# Patient Record
Sex: Female | Born: 1935 | Race: White | Hispanic: No | Smoking: Former smoker
Health system: Southern US, Community
[De-identification: ages and names within clinical notes are randomized; demographics above are authoritative.]

## PROBLEM LIST (undated history)

## (undated) DIAGNOSIS — I10 Essential (primary) hypertension: Secondary | ICD-10-CM

## (undated) DIAGNOSIS — F329 Major depressive disorder, single episode, unspecified: Secondary | ICD-10-CM

## (undated) DIAGNOSIS — D376 Neoplasm of uncertain behavior of liver, gallbladder and bile ducts: Secondary | ICD-10-CM

## (undated) DIAGNOSIS — T7840XA Allergy, unspecified, initial encounter: Secondary | ICD-10-CM

## (undated) DIAGNOSIS — T8859XA Other complications of anesthesia, initial encounter: Secondary | ICD-10-CM

## (undated) DIAGNOSIS — M549 Dorsalgia, unspecified: Secondary | ICD-10-CM

## (undated) DIAGNOSIS — R5383 Other fatigue: Secondary | ICD-10-CM

## (undated) DIAGNOSIS — D126 Benign neoplasm of colon, unspecified: Secondary | ICD-10-CM

## (undated) DIAGNOSIS — K648 Other hemorrhoids: Secondary | ICD-10-CM

## (undated) DIAGNOSIS — R209 Unspecified disturbances of skin sensation: Secondary | ICD-10-CM

## (undated) DIAGNOSIS — E079 Disorder of thyroid, unspecified: Secondary | ICD-10-CM

## (undated) DIAGNOSIS — M810 Age-related osteoporosis without current pathological fracture: Secondary | ICD-10-CM

## (undated) DIAGNOSIS — K644 Residual hemorrhoidal skin tags: Secondary | ICD-10-CM

## (undated) DIAGNOSIS — K589 Irritable bowel syndrome without diarrhea: Secondary | ICD-10-CM

## (undated) DIAGNOSIS — D751 Secondary polycythemia: Secondary | ICD-10-CM

## (undated) DIAGNOSIS — K5900 Constipation, unspecified: Secondary | ICD-10-CM

## (undated) DIAGNOSIS — F411 Generalized anxiety disorder: Secondary | ICD-10-CM

## (undated) DIAGNOSIS — R7301 Impaired fasting glucose: Secondary | ICD-10-CM

## (undated) DIAGNOSIS — S32040A Wedge compression fracture of fourth lumbar vertebra, initial encounter for closed fracture: Secondary | ICD-10-CM

## (undated) DIAGNOSIS — K5790 Diverticulosis of intestine, part unspecified, without perforation or abscess without bleeding: Secondary | ICD-10-CM

## (undated) DIAGNOSIS — T4145XA Adverse effect of unspecified anesthetic, initial encounter: Secondary | ICD-10-CM

## (undated) DIAGNOSIS — M4800 Spinal stenosis, site unspecified: Secondary | ICD-10-CM

## (undated) DIAGNOSIS — N3010 Interstitial cystitis (chronic) without hematuria: Secondary | ICD-10-CM

## (undated) DIAGNOSIS — K5730 Diverticulosis of large intestine without perforation or abscess without bleeding: Secondary | ICD-10-CM

## (undated) DIAGNOSIS — E785 Hyperlipidemia, unspecified: Secondary | ICD-10-CM

## (undated) DIAGNOSIS — C801 Malignant (primary) neoplasm, unspecified: Secondary | ICD-10-CM

## (undated) DIAGNOSIS — Z853 Personal history of malignant neoplasm of breast: Secondary | ICD-10-CM

## (undated) DIAGNOSIS — R5381 Other malaise: Secondary | ICD-10-CM

## (undated) HISTORY — DX: Disorder of thyroid, unspecified: E07.9

## (undated) HISTORY — PX: CHOLECYSTECTOMY: SHX55

## (undated) HISTORY — DX: Impaired fasting glucose: R73.01

## (undated) HISTORY — PX: CERVICAL SPINE SURGERY: SHX589

## (undated) HISTORY — DX: Essential (primary) hypertension: I10

## (undated) HISTORY — DX: Dorsalgia, unspecified: M54.9

## (undated) HISTORY — DX: Secondary polycythemia: D75.1

## (undated) HISTORY — PX: HEMORRHOID BANDING: SHX5850

## (undated) HISTORY — DX: Diverticulosis of large intestine without perforation or abscess without bleeding: K57.30

## (undated) HISTORY — DX: Generalized anxiety disorder: F41.1

## (undated) HISTORY — DX: Spinal stenosis, site unspecified: M48.00

## (undated) HISTORY — DX: Other malaise: R53.81

## (undated) HISTORY — DX: Benign neoplasm of colon, unspecified: D12.6

## (undated) HISTORY — DX: Hypercalcemia: E83.52

## (undated) HISTORY — DX: Neoplasm of uncertain behavior of liver, gallbladder and bile ducts: D37.6

## (undated) HISTORY — DX: Unspecified disturbances of skin sensation: R20.9

## (undated) HISTORY — DX: Age-related osteoporosis without current pathological fracture: M81.0

## (undated) HISTORY — PX: BACK SURGERY: SHX140

## (undated) HISTORY — DX: Other complications of anesthesia, initial encounter: T88.59XA

## (undated) HISTORY — DX: Irritable bowel syndrome without diarrhea: K58.9

## (undated) HISTORY — DX: Interstitial cystitis (chronic) without hematuria: N30.10

## (undated) HISTORY — DX: Malignant (primary) neoplasm, unspecified: C80.1

## (undated) HISTORY — PX: COLONOSCOPY W/ BIOPSIES: SHX1374

## (undated) HISTORY — DX: Diverticulosis of intestine, part unspecified, without perforation or abscess without bleeding: K57.90

## (undated) HISTORY — DX: Other fatigue: R53.83

## (undated) HISTORY — PX: BREAST SURGERY: SHX581

## (undated) HISTORY — DX: Major depressive disorder, single episode, unspecified: F32.9

## (undated) HISTORY — DX: Constipation, unspecified: K59.00

## (undated) HISTORY — DX: Other hemorrhoids: K64.8

## (undated) HISTORY — DX: Hyperlipidemia, unspecified: E78.5

## (undated) HISTORY — DX: Adverse effect of unspecified anesthetic, initial encounter: T41.45XA

## (undated) HISTORY — PX: ABDOMINAL HYSTERECTOMY: SHX81

## (undated) HISTORY — DX: Personal history of malignant neoplasm of breast: Z85.3

## (undated) HISTORY — DX: Allergy, unspecified, initial encounter: T78.40XA

## (undated) HISTORY — DX: Residual hemorrhoidal skin tags: K64.4

## (undated) HISTORY — PX: FIXATION KYPHOPLASTY LUMBAR SPINE: SHX1642

---

## 1997-08-24 ENCOUNTER — Encounter: Payer: Self-pay | Admitting: Internal Medicine

## 1997-08-24 LAB — CONVERTED CEMR LAB

## 1999-01-02 ENCOUNTER — Encounter: Payer: Self-pay | Admitting: Internal Medicine

## 1999-01-02 ENCOUNTER — Ambulatory Visit (HOSPITAL_COMMUNITY): Admission: RE | Admit: 1999-01-02 | Discharge: 1999-01-02 | Payer: Self-pay | Admitting: Internal Medicine

## 1999-04-10 ENCOUNTER — Other Ambulatory Visit: Admission: RE | Admit: 1999-04-10 | Discharge: 1999-04-10 | Payer: Self-pay | Admitting: Obstetrics and Gynecology

## 1999-09-02 ENCOUNTER — Encounter: Admission: RE | Admit: 1999-09-02 | Discharge: 1999-09-02 | Payer: Self-pay | Admitting: Orthopedic Surgery

## 1999-09-02 ENCOUNTER — Encounter: Payer: Self-pay | Admitting: Orthopedic Surgery

## 1999-11-10 ENCOUNTER — Encounter: Admission: RE | Admit: 1999-11-10 | Discharge: 1999-11-17 | Payer: Self-pay | Admitting: Orthopedic Surgery

## 1999-12-10 ENCOUNTER — Encounter: Admission: RE | Admit: 1999-12-10 | Discharge: 2000-01-28 | Payer: Self-pay | Admitting: Orthopedic Surgery

## 2000-10-06 ENCOUNTER — Encounter: Admission: RE | Admit: 2000-10-06 | Discharge: 2000-11-25 | Payer: Self-pay | Admitting: *Deleted

## 2001-05-31 ENCOUNTER — Encounter: Admission: RE | Admit: 2001-05-31 | Discharge: 2001-05-31 | Payer: Self-pay | Admitting: Oncology

## 2001-05-31 ENCOUNTER — Encounter (HOSPITAL_COMMUNITY): Admission: RE | Admit: 2001-05-31 | Discharge: 2001-06-30 | Payer: Self-pay | Admitting: Oncology

## 2002-05-30 ENCOUNTER — Encounter (HOSPITAL_COMMUNITY): Admission: RE | Admit: 2002-05-30 | Discharge: 2002-06-29 | Payer: Self-pay | Admitting: Oncology

## 2002-05-30 ENCOUNTER — Encounter: Admission: RE | Admit: 2002-05-30 | Discharge: 2002-05-30 | Payer: Self-pay | Admitting: Oncology

## 2003-05-31 ENCOUNTER — Encounter: Payer: Self-pay | Admitting: Gastroenterology

## 2003-05-31 ENCOUNTER — Encounter: Admission: RE | Admit: 2003-05-31 | Discharge: 2003-05-31 | Payer: Self-pay | Admitting: Oncology

## 2003-05-31 ENCOUNTER — Ambulatory Visit (HOSPITAL_COMMUNITY): Admission: RE | Admit: 2003-05-31 | Discharge: 2003-05-31 | Payer: Self-pay | Admitting: Gastroenterology

## 2003-05-31 ENCOUNTER — Encounter (HOSPITAL_COMMUNITY): Admission: RE | Admit: 2003-05-31 | Discharge: 2003-06-30 | Payer: Self-pay | Admitting: Oncology

## 2003-08-12 ENCOUNTER — Encounter (INDEPENDENT_AMBULATORY_CARE_PROVIDER_SITE_OTHER): Payer: Self-pay | Admitting: Specialist

## 2003-08-12 ENCOUNTER — Observation Stay (HOSPITAL_COMMUNITY): Admission: RE | Admit: 2003-08-12 | Discharge: 2003-08-13 | Payer: Self-pay | Admitting: Surgery

## 2004-06-04 ENCOUNTER — Other Ambulatory Visit: Payer: Self-pay

## 2004-06-04 ENCOUNTER — Ambulatory Visit: Payer: Self-pay | Admitting: Urology

## 2004-06-08 ENCOUNTER — Encounter (HOSPITAL_COMMUNITY): Admission: RE | Admit: 2004-06-08 | Discharge: 2004-07-08 | Payer: Self-pay | Admitting: Oncology

## 2004-06-08 ENCOUNTER — Encounter: Admission: RE | Admit: 2004-06-08 | Discharge: 2004-06-08 | Payer: Self-pay | Admitting: Oncology

## 2004-06-09 ENCOUNTER — Ambulatory Visit: Payer: Self-pay | Admitting: Urology

## 2004-07-07 ENCOUNTER — Ambulatory Visit: Payer: Self-pay | Admitting: Internal Medicine

## 2004-09-30 ENCOUNTER — Ambulatory Visit: Payer: Self-pay | Admitting: Gastroenterology

## 2004-10-09 ENCOUNTER — Emergency Department (HOSPITAL_COMMUNITY): Admission: EM | Admit: 2004-10-09 | Discharge: 2004-10-09 | Payer: Self-pay | Admitting: Emergency Medicine

## 2004-11-26 ENCOUNTER — Ambulatory Visit: Payer: Self-pay | Admitting: Internal Medicine

## 2004-12-08 ENCOUNTER — Ambulatory Visit: Payer: Self-pay | Admitting: Internal Medicine

## 2004-12-15 ENCOUNTER — Ambulatory Visit: Payer: Self-pay

## 2004-12-16 ENCOUNTER — Ambulatory Visit: Payer: Self-pay | Admitting: Internal Medicine

## 2005-03-10 ENCOUNTER — Ambulatory Visit: Payer: Self-pay | Admitting: Internal Medicine

## 2005-03-17 ENCOUNTER — Ambulatory Visit: Payer: Self-pay | Admitting: Internal Medicine

## 2005-04-23 DIAGNOSIS — D126 Benign neoplasm of colon, unspecified: Secondary | ICD-10-CM

## 2005-04-23 HISTORY — DX: Benign neoplasm of colon, unspecified: D12.6

## 2005-04-27 ENCOUNTER — Ambulatory Visit: Payer: Self-pay | Admitting: Gastroenterology

## 2005-05-11 ENCOUNTER — Encounter (INDEPENDENT_AMBULATORY_CARE_PROVIDER_SITE_OTHER): Payer: Self-pay | Admitting: *Deleted

## 2005-05-11 ENCOUNTER — Ambulatory Visit: Payer: Self-pay | Admitting: Gastroenterology

## 2005-05-11 DIAGNOSIS — K5730 Diverticulosis of large intestine without perforation or abscess without bleeding: Secondary | ICD-10-CM

## 2005-05-11 DIAGNOSIS — K648 Other hemorrhoids: Secondary | ICD-10-CM

## 2005-05-11 DIAGNOSIS — K644 Residual hemorrhoidal skin tags: Secondary | ICD-10-CM

## 2005-05-11 HISTORY — DX: Other hemorrhoids: K64.8

## 2005-05-11 HISTORY — DX: Residual hemorrhoidal skin tags: K64.4

## 2005-05-11 HISTORY — DX: Diverticulosis of large intestine without perforation or abscess without bleeding: K57.30

## 2005-06-08 ENCOUNTER — Encounter (HOSPITAL_COMMUNITY): Admission: RE | Admit: 2005-06-08 | Discharge: 2005-07-08 | Payer: Self-pay | Admitting: Oncology

## 2005-06-08 ENCOUNTER — Ambulatory Visit (HOSPITAL_COMMUNITY): Payer: Self-pay | Admitting: Oncology

## 2005-06-08 ENCOUNTER — Encounter: Admission: RE | Admit: 2005-06-08 | Discharge: 2005-06-08 | Payer: Self-pay | Admitting: Oncology

## 2005-07-22 ENCOUNTER — Ambulatory Visit: Payer: Self-pay | Admitting: Internal Medicine

## 2005-07-23 ENCOUNTER — Other Ambulatory Visit: Admission: RE | Admit: 2005-07-23 | Discharge: 2005-07-23 | Payer: Self-pay | Admitting: Obstetrics and Gynecology

## 2005-09-16 ENCOUNTER — Ambulatory Visit: Payer: Self-pay | Admitting: Physical Medicine & Rehabilitation

## 2005-09-16 ENCOUNTER — Encounter
Admission: RE | Admit: 2005-09-16 | Discharge: 2005-12-15 | Payer: Self-pay | Admitting: Physical Medicine & Rehabilitation

## 2005-10-20 ENCOUNTER — Ambulatory Visit: Payer: Self-pay | Admitting: Physical Medicine & Rehabilitation

## 2005-12-01 ENCOUNTER — Ambulatory Visit: Payer: Self-pay | Admitting: Physical Medicine & Rehabilitation

## 2005-12-04 ENCOUNTER — Emergency Department (HOSPITAL_COMMUNITY): Admission: EM | Admit: 2005-12-04 | Discharge: 2005-12-04 | Payer: Self-pay | Admitting: Emergency Medicine

## 2005-12-15 ENCOUNTER — Encounter
Admission: RE | Admit: 2005-12-15 | Discharge: 2006-03-15 | Payer: Self-pay | Admitting: Physical Medicine & Rehabilitation

## 2006-01-13 ENCOUNTER — Ambulatory Visit: Payer: Self-pay | Admitting: Physical Medicine & Rehabilitation

## 2006-03-09 ENCOUNTER — Ambulatory Visit: Payer: Self-pay | Admitting: Physical Medicine & Rehabilitation

## 2006-03-23 ENCOUNTER — Encounter
Admission: RE | Admit: 2006-03-23 | Discharge: 2006-06-21 | Payer: Self-pay | Admitting: Physical Medicine & Rehabilitation

## 2006-03-31 ENCOUNTER — Ambulatory Visit: Payer: Self-pay | Admitting: Physical Medicine & Rehabilitation

## 2006-04-22 ENCOUNTER — Ambulatory Visit: Payer: Self-pay | Admitting: Physical Medicine & Rehabilitation

## 2006-05-23 ENCOUNTER — Ambulatory Visit: Payer: Self-pay | Admitting: Physical Medicine & Rehabilitation

## 2006-05-27 ENCOUNTER — Ambulatory Visit: Payer: Self-pay | Admitting: Physical Medicine & Rehabilitation

## 2006-06-07 ENCOUNTER — Encounter: Admission: RE | Admit: 2006-06-07 | Discharge: 2006-06-07 | Payer: Self-pay | Admitting: Oncology

## 2006-06-07 ENCOUNTER — Ambulatory Visit (HOSPITAL_COMMUNITY): Payer: Self-pay | Admitting: Oncology

## 2006-06-07 ENCOUNTER — Encounter (HOSPITAL_COMMUNITY): Admission: RE | Admit: 2006-06-07 | Discharge: 2006-07-07 | Payer: Self-pay | Admitting: Oncology

## 2006-06-27 ENCOUNTER — Encounter
Admission: RE | Admit: 2006-06-27 | Discharge: 2006-09-25 | Payer: Self-pay | Admitting: Physical Medicine & Rehabilitation

## 2006-06-27 ENCOUNTER — Ambulatory Visit: Payer: Self-pay | Admitting: Physical Medicine & Rehabilitation

## 2006-07-06 ENCOUNTER — Ambulatory Visit: Payer: Self-pay | Admitting: Physical Medicine & Rehabilitation

## 2006-07-19 ENCOUNTER — Ambulatory Visit: Payer: Self-pay | Admitting: Internal Medicine

## 2006-07-19 LAB — CONVERTED CEMR LAB: HCT: 44.7 % (ref 36.0–46.0)

## 2006-09-12 ENCOUNTER — Encounter
Admission: RE | Admit: 2006-09-12 | Discharge: 2006-12-11 | Payer: Self-pay | Admitting: Physical Medicine & Rehabilitation

## 2006-09-16 ENCOUNTER — Ambulatory Visit: Payer: Self-pay | Admitting: Physical Medicine & Rehabilitation

## 2006-10-14 ENCOUNTER — Ambulatory Visit: Payer: Self-pay | Admitting: Internal Medicine

## 2006-10-14 LAB — CONVERTED CEMR LAB
ALT: 32 units/L (ref 0–40)
Alkaline Phosphatase: 90 units/L (ref 39–117)
BUN: 11 mg/dL (ref 6–23)
Basophils Absolute: 0.1 10*3/uL (ref 0.0–0.1)
Bilirubin Urine: NEGATIVE
Bilirubin, Direct: 0.1 mg/dL (ref 0.0–0.3)
Calcium: 10.9 mg/dL — ABNORMAL HIGH (ref 8.4–10.5)
Eosinophils Absolute: 0.1 10*3/uL (ref 0.0–0.6)
GFR calc Af Amer: 105 mL/min
GFR calc non Af Amer: 87 mL/min
HDL: 36.4 mg/dL — ABNORMAL LOW (ref >39.0)
Lymphocytes Relative: 38 % (ref 12.0–46.0)
MCV: 87.4 fL (ref 78.0–100.0)
Monocytes Relative: 4.5 % (ref 3.0–11.0)
Neutro Abs: 3.8 10*3/uL (ref 1.4–7.7)
Platelets: 254 10*3/uL (ref 150–400)
Specific Gravity, Urine: 1.02 (ref 1.000–1.03)
TSH: 1.77 microintl units/mL (ref 0.35–5.50)
Triglycerides: 409 mg/dL (ref 0–149)
Urine Glucose: NEGATIVE mg/dL

## 2006-10-17 ENCOUNTER — Ambulatory Visit: Payer: Self-pay | Admitting: Internal Medicine

## 2006-10-23 ENCOUNTER — Encounter (INDEPENDENT_AMBULATORY_CARE_PROVIDER_SITE_OTHER): Payer: Self-pay | Admitting: *Deleted

## 2006-10-23 ENCOUNTER — Emergency Department (HOSPITAL_COMMUNITY): Admission: EM | Admit: 2006-10-23 | Discharge: 2006-10-23 | Payer: Self-pay | Admitting: Emergency Medicine

## 2006-10-28 ENCOUNTER — Ambulatory Visit: Payer: Self-pay | Admitting: Physical Medicine & Rehabilitation

## 2006-12-12 ENCOUNTER — Ambulatory Visit: Payer: Self-pay | Admitting: Physical Medicine & Rehabilitation

## 2006-12-12 ENCOUNTER — Encounter
Admission: RE | Admit: 2006-12-12 | Discharge: 2007-03-12 | Payer: Self-pay | Admitting: Physical Medicine & Rehabilitation

## 2006-12-15 ENCOUNTER — Ambulatory Visit: Payer: Self-pay | Admitting: Internal Medicine

## 2006-12-15 LAB — CONVERTED CEMR LAB
AST: 24 units/L (ref 0–37)
Alkaline Phosphatase: 82 units/L (ref 39–117)
Cholesterol: 200 mg/dL (ref 0–200)
Total Bilirubin: 0.9 mg/dL (ref 0.3–1.2)
Total CHOL/HDL Ratio: 4.9
Total Protein: 7 g/dL (ref 6.0–8.3)
Triglycerides: 172 mg/dL — ABNORMAL HIGH (ref 0–149)

## 2006-12-20 ENCOUNTER — Ambulatory Visit: Payer: Self-pay | Admitting: Internal Medicine

## 2007-01-23 ENCOUNTER — Ambulatory Visit: Payer: Self-pay | Admitting: Physical Medicine & Rehabilitation

## 2007-02-04 ENCOUNTER — Encounter (INDEPENDENT_AMBULATORY_CARE_PROVIDER_SITE_OTHER): Payer: Self-pay | Admitting: *Deleted

## 2007-02-04 ENCOUNTER — Encounter: Admission: RE | Admit: 2007-02-04 | Discharge: 2007-02-04 | Payer: Self-pay | Admitting: Neurosurgery

## 2007-02-21 ENCOUNTER — Ambulatory Visit (HOSPITAL_COMMUNITY): Admission: RE | Admit: 2007-02-21 | Discharge: 2007-02-22 | Payer: Self-pay | Admitting: Neurosurgery

## 2007-03-01 ENCOUNTER — Ambulatory Visit: Payer: Self-pay | Admitting: Internal Medicine

## 2007-03-01 DIAGNOSIS — E785 Hyperlipidemia, unspecified: Secondary | ICD-10-CM | POA: Insufficient documentation

## 2007-03-01 DIAGNOSIS — M4800 Spinal stenosis, site unspecified: Secondary | ICD-10-CM

## 2007-03-01 DIAGNOSIS — M549 Dorsalgia, unspecified: Secondary | ICD-10-CM | POA: Insufficient documentation

## 2007-03-01 HISTORY — DX: Spinal stenosis, site unspecified: M48.00

## 2007-03-01 HISTORY — DX: Dorsalgia, unspecified: M54.9

## 2007-03-01 HISTORY — DX: Hyperlipidemia, unspecified: E78.5

## 2007-03-03 ENCOUNTER — Telehealth (INDEPENDENT_AMBULATORY_CARE_PROVIDER_SITE_OTHER): Payer: Self-pay | Admitting: *Deleted

## 2007-03-06 ENCOUNTER — Ambulatory Visit: Payer: Self-pay | Admitting: Physical Medicine & Rehabilitation

## 2007-03-10 ENCOUNTER — Encounter (INDEPENDENT_AMBULATORY_CARE_PROVIDER_SITE_OTHER): Payer: Self-pay | Admitting: *Deleted

## 2007-03-10 ENCOUNTER — Ambulatory Visit: Payer: Self-pay | Admitting: Internal Medicine

## 2007-03-13 ENCOUNTER — Encounter (INDEPENDENT_AMBULATORY_CARE_PROVIDER_SITE_OTHER): Payer: Self-pay | Admitting: *Deleted

## 2007-04-03 ENCOUNTER — Encounter
Admission: RE | Admit: 2007-04-03 | Discharge: 2007-07-02 | Payer: Self-pay | Admitting: Physical Medicine & Rehabilitation

## 2007-04-06 ENCOUNTER — Encounter (INDEPENDENT_AMBULATORY_CARE_PROVIDER_SITE_OTHER): Payer: Self-pay | Admitting: *Deleted

## 2007-04-20 ENCOUNTER — Telehealth (INDEPENDENT_AMBULATORY_CARE_PROVIDER_SITE_OTHER): Payer: Self-pay | Admitting: *Deleted

## 2007-04-25 ENCOUNTER — Ambulatory Visit: Payer: Self-pay | Admitting: Physical Medicine & Rehabilitation

## 2007-05-10 ENCOUNTER — Telehealth (INDEPENDENT_AMBULATORY_CARE_PROVIDER_SITE_OTHER): Payer: Self-pay | Admitting: *Deleted

## 2007-05-11 ENCOUNTER — Telehealth (INDEPENDENT_AMBULATORY_CARE_PROVIDER_SITE_OTHER): Payer: Self-pay | Admitting: *Deleted

## 2007-06-06 ENCOUNTER — Ambulatory Visit (HOSPITAL_COMMUNITY): Payer: Self-pay | Admitting: Oncology

## 2007-06-06 ENCOUNTER — Encounter (HOSPITAL_COMMUNITY): Admission: RE | Admit: 2007-06-06 | Discharge: 2007-07-06 | Payer: Self-pay | Admitting: Oncology

## 2007-06-12 ENCOUNTER — Ambulatory Visit: Payer: Self-pay | Admitting: Physical Medicine & Rehabilitation

## 2007-07-03 ENCOUNTER — Encounter
Admission: RE | Admit: 2007-07-03 | Discharge: 2007-08-01 | Payer: Self-pay | Admitting: Physical Medicine & Rehabilitation

## 2007-07-28 ENCOUNTER — Ambulatory Visit: Payer: Self-pay | Admitting: Physical Medicine & Rehabilitation

## 2007-08-30 ENCOUNTER — Telehealth: Payer: Self-pay | Admitting: Internal Medicine

## 2007-09-06 ENCOUNTER — Encounter
Admission: RE | Admit: 2007-09-06 | Discharge: 2007-12-05 | Payer: Self-pay | Admitting: Physical Medicine & Rehabilitation

## 2007-09-06 ENCOUNTER — Ambulatory Visit: Payer: Self-pay | Admitting: Physical Medicine & Rehabilitation

## 2007-09-06 ENCOUNTER — Ambulatory Visit: Payer: Self-pay | Admitting: Internal Medicine

## 2007-09-06 DIAGNOSIS — R209 Unspecified disturbances of skin sensation: Secondary | ICD-10-CM | POA: Insufficient documentation

## 2007-09-06 DIAGNOSIS — N3010 Interstitial cystitis (chronic) without hematuria: Secondary | ICD-10-CM

## 2007-09-06 HISTORY — DX: Unspecified disturbances of skin sensation: R20.9

## 2007-09-06 HISTORY — DX: Interstitial cystitis (chronic) without hematuria: N30.10

## 2007-09-07 ENCOUNTER — Telehealth: Payer: Self-pay | Admitting: Internal Medicine

## 2007-10-05 ENCOUNTER — Encounter: Payer: Self-pay | Admitting: Internal Medicine

## 2007-10-11 ENCOUNTER — Ambulatory Visit: Payer: Self-pay | Admitting: Physical Medicine & Rehabilitation

## 2007-10-11 ENCOUNTER — Encounter (INDEPENDENT_AMBULATORY_CARE_PROVIDER_SITE_OTHER): Payer: Self-pay | Admitting: *Deleted

## 2007-10-11 LAB — HM MAMMOGRAPHY: HM Mammogram: NORMAL

## 2007-10-13 DIAGNOSIS — F411 Generalized anxiety disorder: Secondary | ICD-10-CM

## 2007-10-13 DIAGNOSIS — F329 Major depressive disorder, single episode, unspecified: Secondary | ICD-10-CM

## 2007-10-13 DIAGNOSIS — I10 Essential (primary) hypertension: Secondary | ICD-10-CM

## 2007-10-13 DIAGNOSIS — F3289 Other specified depressive episodes: Secondary | ICD-10-CM

## 2007-10-13 HISTORY — DX: Major depressive disorder, single episode, unspecified: F32.9

## 2007-10-13 HISTORY — DX: Essential (primary) hypertension: I10

## 2007-10-13 HISTORY — DX: Generalized anxiety disorder: F41.1

## 2007-10-13 HISTORY — DX: Other specified depressive episodes: F32.89

## 2007-10-16 ENCOUNTER — Ambulatory Visit: Payer: Self-pay | Admitting: Internal Medicine

## 2007-10-25 ENCOUNTER — Ambulatory Visit: Payer: Self-pay | Admitting: Physical Medicine & Rehabilitation

## 2007-11-01 ENCOUNTER — Ambulatory Visit: Payer: Self-pay | Admitting: Internal Medicine

## 2007-11-01 ENCOUNTER — Encounter
Admission: RE | Admit: 2007-11-01 | Discharge: 2008-01-30 | Payer: Self-pay | Admitting: Physical Medicine & Rehabilitation

## 2007-11-01 LAB — CONVERTED CEMR LAB
Fecal Occult Blood: NEGATIVE
OCCULT 2: NEGATIVE

## 2007-11-23 ENCOUNTER — Ambulatory Visit: Payer: Self-pay | Admitting: Physical Medicine & Rehabilitation

## 2007-11-27 ENCOUNTER — Ambulatory Visit: Payer: Self-pay | Admitting: Physical Medicine & Rehabilitation

## 2007-12-05 ENCOUNTER — Ambulatory Visit: Payer: Self-pay | Admitting: Physical Medicine & Rehabilitation

## 2007-12-06 ENCOUNTER — Encounter: Payer: Self-pay | Admitting: Internal Medicine

## 2007-12-06 DIAGNOSIS — Z853 Personal history of malignant neoplasm of breast: Secondary | ICD-10-CM

## 2007-12-06 HISTORY — DX: Personal history of malignant neoplasm of breast: Z85.3

## 2007-12-12 ENCOUNTER — Ambulatory Visit: Payer: Self-pay | Admitting: Physical Medicine & Rehabilitation

## 2007-12-14 ENCOUNTER — Ambulatory Visit: Payer: Self-pay | Admitting: Internal Medicine

## 2007-12-18 ENCOUNTER — Ambulatory Visit: Payer: Self-pay | Admitting: Physical Medicine & Rehabilitation

## 2007-12-28 ENCOUNTER — Ambulatory Visit: Payer: Self-pay | Admitting: Physical Medicine & Rehabilitation

## 2008-01-18 ENCOUNTER — Telehealth: Payer: Self-pay | Admitting: Internal Medicine

## 2008-01-31 ENCOUNTER — Ambulatory Visit: Payer: Self-pay | Admitting: Internal Medicine

## 2008-02-01 ENCOUNTER — Encounter
Admission: RE | Admit: 2008-02-01 | Discharge: 2008-05-01 | Payer: Self-pay | Admitting: Physical Medicine & Rehabilitation

## 2008-02-02 ENCOUNTER — Ambulatory Visit: Payer: Self-pay | Admitting: Physical Medicine & Rehabilitation

## 2008-02-13 ENCOUNTER — Ambulatory Visit: Payer: Self-pay | Admitting: Physical Medicine & Rehabilitation

## 2008-02-27 ENCOUNTER — Ambulatory Visit: Payer: Self-pay | Admitting: Physical Medicine & Rehabilitation

## 2008-03-08 ENCOUNTER — Ambulatory Visit: Payer: Self-pay | Admitting: Physical Medicine & Rehabilitation

## 2008-03-18 ENCOUNTER — Ambulatory Visit: Payer: Self-pay | Admitting: Physical Medicine & Rehabilitation

## 2008-04-01 ENCOUNTER — Ambulatory Visit: Payer: Self-pay | Admitting: Physical Medicine & Rehabilitation

## 2008-04-01 ENCOUNTER — Encounter
Admission: RE | Admit: 2008-04-01 | Discharge: 2008-04-01 | Payer: Self-pay | Admitting: Physical Medicine & Rehabilitation

## 2008-04-03 ENCOUNTER — Emergency Department (HOSPITAL_COMMUNITY): Admission: EM | Admit: 2008-04-03 | Discharge: 2008-04-03 | Payer: Self-pay | Admitting: Emergency Medicine

## 2008-04-05 ENCOUNTER — Telehealth: Payer: Self-pay | Admitting: Family Medicine

## 2008-04-11 ENCOUNTER — Ambulatory Visit: Payer: Self-pay | Admitting: Physical Medicine & Rehabilitation

## 2008-04-15 ENCOUNTER — Ambulatory Visit: Payer: Self-pay | Admitting: Internal Medicine

## 2008-04-15 DIAGNOSIS — S239XXA Sprain of unspecified parts of thorax, initial encounter: Secondary | ICD-10-CM

## 2008-04-23 ENCOUNTER — Ambulatory Visit: Payer: Self-pay | Admitting: Physical Medicine & Rehabilitation

## 2008-05-03 ENCOUNTER — Encounter
Admission: RE | Admit: 2008-05-03 | Discharge: 2008-05-30 | Payer: Self-pay | Admitting: Physical Medicine & Rehabilitation

## 2008-05-06 ENCOUNTER — Ambulatory Visit: Payer: Self-pay | Admitting: Physical Medicine & Rehabilitation

## 2008-05-17 ENCOUNTER — Ambulatory Visit: Payer: Self-pay | Admitting: Physical Medicine & Rehabilitation

## 2008-05-30 ENCOUNTER — Ambulatory Visit: Payer: Self-pay | Admitting: Physical Medicine & Rehabilitation

## 2008-05-30 ENCOUNTER — Telehealth: Payer: Self-pay | Admitting: Internal Medicine

## 2008-06-04 ENCOUNTER — Ambulatory Visit (HOSPITAL_COMMUNITY): Payer: Self-pay | Admitting: Oncology

## 2008-07-11 ENCOUNTER — Telehealth (INDEPENDENT_AMBULATORY_CARE_PROVIDER_SITE_OTHER): Payer: Self-pay | Admitting: *Deleted

## 2008-07-15 ENCOUNTER — Ambulatory Visit: Payer: Self-pay | Admitting: Internal Medicine

## 2008-08-06 ENCOUNTER — Encounter: Payer: Self-pay | Admitting: Internal Medicine

## 2008-08-07 ENCOUNTER — Ambulatory Visit: Payer: Self-pay | Admitting: Internal Medicine

## 2008-08-07 DIAGNOSIS — R5383 Other fatigue: Secondary | ICD-10-CM

## 2008-08-07 DIAGNOSIS — R5381 Other malaise: Secondary | ICD-10-CM

## 2008-08-07 HISTORY — DX: Other fatigue: R53.83

## 2008-08-07 HISTORY — DX: Other malaise: R53.81

## 2008-08-07 LAB — CONVERTED CEMR LAB
ALT: 41 units/L — ABNORMAL HIGH (ref 0–35)
AST: 32 units/L (ref 0–37)
BUN: 11 mg/dL (ref 6–23)
Bilirubin, Direct: 0.1 mg/dL (ref 0.0–0.3)
Calcium: 11 mg/dL — ABNORMAL HIGH (ref 8.4–10.5)
Eosinophils Relative: 1 % (ref 0.0–5.0)
GFR calc Af Amer: 90 mL/min
Glucose, Bld: 136 mg/dL — ABNORMAL HIGH (ref 70–99)
Glucose, Urine, Semiquant: NEGATIVE
HCT: 49 % — ABNORMAL HIGH (ref 36.0–46.0)
Hemoglobin: 16.7 g/dL — ABNORMAL HIGH (ref 12.0–15.0)
Ketones, urine, test strip: NEGATIVE
Lymphocytes Relative: 40.1 % (ref 12.0–46.0)
Monocytes Absolute: 0.4 10*3/uL (ref 0.1–1.0)
Monocytes Relative: 4.5 % (ref 3.0–12.0)
Neutro Abs: 4.7 10*3/uL (ref 1.4–7.7)
Phosphorus: 3 mg/dL (ref 2.3–4.6)
Platelets: 223 10*3/uL (ref 150–400)
Potassium: 4 meq/L (ref 3.5–5.1)
Total Bilirubin: 0.8 mg/dL (ref 0.3–1.2)
Total Protein: 7.5 g/dL (ref 6.0–8.3)
Urobilinogen, UA: 0.2
WBC Urine, dipstick: NEGATIVE
WBC: 8.6 10*3/uL (ref 4.5–10.5)

## 2008-10-09 ENCOUNTER — Ambulatory Visit: Payer: Self-pay | Admitting: Internal Medicine

## 2008-10-22 ENCOUNTER — Encounter: Payer: Self-pay | Admitting: Internal Medicine

## 2008-10-22 ENCOUNTER — Ambulatory Visit: Payer: Self-pay | Admitting: Internal Medicine

## 2008-10-30 ENCOUNTER — Encounter: Payer: Self-pay | Admitting: Internal Medicine

## 2008-11-14 ENCOUNTER — Ambulatory Visit: Payer: Self-pay | Admitting: Internal Medicine

## 2008-11-14 DIAGNOSIS — R7301 Impaired fasting glucose: Secondary | ICD-10-CM

## 2008-11-14 HISTORY — DX: Impaired fasting glucose: R73.01

## 2008-12-10 ENCOUNTER — Telehealth: Payer: Self-pay | Admitting: Internal Medicine

## 2008-12-13 ENCOUNTER — Encounter: Payer: Self-pay | Admitting: Internal Medicine

## 2008-12-30 ENCOUNTER — Telehealth: Payer: Self-pay | Admitting: Internal Medicine

## 2009-01-06 ENCOUNTER — Telehealth: Payer: Self-pay | Admitting: Internal Medicine

## 2009-01-06 ENCOUNTER — Ambulatory Visit: Payer: Self-pay | Admitting: Internal Medicine

## 2009-01-06 DIAGNOSIS — K5900 Constipation, unspecified: Secondary | ICD-10-CM

## 2009-01-06 DIAGNOSIS — K589 Irritable bowel syndrome without diarrhea: Secondary | ICD-10-CM

## 2009-01-06 HISTORY — DX: Constipation, unspecified: K59.00

## 2009-01-06 HISTORY — DX: Irritable bowel syndrome, unspecified: K58.9

## 2009-02-19 ENCOUNTER — Encounter: Payer: Self-pay | Admitting: Internal Medicine

## 2009-02-25 ENCOUNTER — Encounter: Payer: Self-pay | Admitting: Internal Medicine

## 2009-02-27 ENCOUNTER — Telehealth: Payer: Self-pay | Admitting: Internal Medicine

## 2009-04-02 ENCOUNTER — Telehealth: Payer: Self-pay | Admitting: Internal Medicine

## 2009-04-15 ENCOUNTER — Telehealth: Payer: Self-pay | Admitting: Internal Medicine

## 2009-04-23 ENCOUNTER — Ambulatory Visit: Payer: Self-pay | Admitting: Internal Medicine

## 2009-04-23 DIAGNOSIS — D751 Secondary polycythemia: Secondary | ICD-10-CM

## 2009-04-23 HISTORY — DX: Hypercalcemia: E83.52

## 2009-04-23 HISTORY — DX: Secondary polycythemia: D75.1

## 2009-04-24 ENCOUNTER — Encounter: Payer: Self-pay | Admitting: Internal Medicine

## 2009-04-24 ENCOUNTER — Ambulatory Visit: Payer: Self-pay | Admitting: Family Medicine

## 2009-04-24 LAB — CONVERTED CEMR LAB
ALT: 51 units/L — ABNORMAL HIGH (ref 0–35)
AST: 39 units/L — ABNORMAL HIGH (ref 0–37)
Albumin: 4.4 g/dL (ref 3.5–5.2)
BUN: 10 mg/dL (ref 6–23)
Basophils Absolute: 0 10*3/uL (ref 0.0–0.1)
Basophils Relative: 0.5 % (ref 0.0–3.0)
CO2: 30 meq/L (ref 19–32)
Calcium: 10.6 mg/dL — ABNORMAL HIGH (ref 8.4–10.5)
Creatinine, Ser: 0.7 mg/dL (ref 0.4–1.2)
Eosinophils Absolute: 0.1 10*3/uL (ref 0.0–0.7)
Glucose, Bld: 114 mg/dL — ABNORMAL HIGH (ref 70–99)
Iron: 97 ug/dL (ref 42–145)
Lymphocytes Relative: 32.9 % (ref 12.0–46.0)
MCHC: 34 g/dL (ref 30.0–36.0)
MCV: 89.5 fL (ref 78.0–100.0)
Monocytes Absolute: 0.5 10*3/uL (ref 0.1–1.0)
Neutrophils Relative %: 58.4 % (ref 43.0–77.0)
Platelets: 212 10*3/uL (ref 150.0–400.0)
RBC: 5.06 M/uL (ref 3.87–5.11)
RDW: 11.9 % (ref 11.5–14.6)
Saturation Ratios: 28.6 % (ref 20.0–50.0)
Sodium: 143 meq/L (ref 135–145)
Total Bilirubin: 0.8 mg/dL (ref 0.3–1.2)
Total Protein: 6.8 g/dL (ref 6.0–8.3)
Transferrin: 241.9 mg/dL (ref 212.0–360.0)

## 2009-04-25 LAB — CONVERTED CEMR LAB: Calcium, Total (PTH): 10.8 mg/dL — ABNORMAL HIGH (ref 8.4–10.5)

## 2009-04-29 ENCOUNTER — Encounter: Payer: Self-pay | Admitting: Internal Medicine

## 2009-04-30 ENCOUNTER — Ambulatory Visit: Payer: Self-pay | Admitting: Internal Medicine

## 2009-05-06 ENCOUNTER — Encounter: Payer: Self-pay | Admitting: Internal Medicine

## 2009-05-07 ENCOUNTER — Telehealth: Payer: Self-pay | Admitting: Internal Medicine

## 2009-05-07 ENCOUNTER — Encounter: Payer: Self-pay | Admitting: Internal Medicine

## 2009-05-08 ENCOUNTER — Telehealth: Payer: Self-pay | Admitting: Internal Medicine

## 2009-05-09 DIAGNOSIS — M810 Age-related osteoporosis without current pathological fracture: Secondary | ICD-10-CM | POA: Insufficient documentation

## 2009-05-09 HISTORY — DX: Age-related osteoporosis without current pathological fracture: M81.0

## 2009-05-12 ENCOUNTER — Telehealth (INDEPENDENT_AMBULATORY_CARE_PROVIDER_SITE_OTHER): Payer: Self-pay | Admitting: *Deleted

## 2009-05-14 ENCOUNTER — Telehealth: Payer: Self-pay | Admitting: Internal Medicine

## 2009-05-15 ENCOUNTER — Ambulatory Visit: Payer: Self-pay | Admitting: Internal Medicine

## 2009-05-15 DIAGNOSIS — D376 Neoplasm of uncertain behavior of liver, gallbladder and bile ducts: Secondary | ICD-10-CM

## 2009-05-15 HISTORY — DX: Neoplasm of uncertain behavior of liver, gallbladder and bile ducts: D37.6

## 2009-05-19 ENCOUNTER — Ambulatory Visit: Payer: Self-pay | Admitting: Cardiology

## 2009-05-20 ENCOUNTER — Emergency Department (HOSPITAL_COMMUNITY): Admission: EM | Admit: 2009-05-20 | Discharge: 2009-05-20 | Payer: Self-pay | Admitting: Family Medicine

## 2009-05-20 ENCOUNTER — Telehealth: Payer: Self-pay | Admitting: Internal Medicine

## 2009-05-23 ENCOUNTER — Ambulatory Visit: Payer: Self-pay | Admitting: Internal Medicine

## 2009-06-04 ENCOUNTER — Encounter (HOSPITAL_COMMUNITY): Admission: RE | Admit: 2009-06-04 | Discharge: 2009-07-25 | Payer: Self-pay

## 2009-06-17 ENCOUNTER — Encounter (HOSPITAL_COMMUNITY): Admission: RE | Admit: 2009-06-17 | Discharge: 2009-07-17 | Payer: Self-pay | Admitting: Oncology

## 2009-06-17 ENCOUNTER — Ambulatory Visit (HOSPITAL_COMMUNITY): Admission: RE | Admit: 2009-06-17 | Discharge: 2009-06-17 | Payer: Self-pay | Admitting: Oncology

## 2009-06-17 ENCOUNTER — Ambulatory Visit (HOSPITAL_COMMUNITY): Payer: Self-pay | Admitting: Oncology

## 2009-06-20 ENCOUNTER — Ambulatory Visit (HOSPITAL_COMMUNITY): Admission: RE | Admit: 2009-06-20 | Discharge: 2009-06-20 | Payer: Self-pay | Admitting: Oncology

## 2009-06-24 ENCOUNTER — Telehealth: Payer: Self-pay | Admitting: Internal Medicine

## 2009-07-04 ENCOUNTER — Telehealth: Payer: Self-pay | Admitting: Internal Medicine

## 2009-08-05 ENCOUNTER — Telehealth: Payer: Self-pay | Admitting: Family Medicine

## 2009-08-13 ENCOUNTER — Telehealth (INDEPENDENT_AMBULATORY_CARE_PROVIDER_SITE_OTHER): Payer: Self-pay | Admitting: *Deleted

## 2009-08-20 ENCOUNTER — Encounter: Payer: Self-pay | Admitting: Family Medicine

## 2009-08-27 ENCOUNTER — Encounter: Payer: Self-pay | Admitting: Internal Medicine

## 2009-08-27 ENCOUNTER — Ambulatory Visit: Payer: Self-pay | Admitting: Family Medicine

## 2009-08-29 ENCOUNTER — Telehealth: Payer: Self-pay | Admitting: Internal Medicine

## 2009-09-01 LAB — CONVERTED CEMR LAB
AST: 26 units/L (ref 0–37)
Alkaline Phosphatase: 78 units/L (ref 39–117)
Basophils Absolute: 0.1 10*3/uL (ref 0.0–0.1)
Bilirubin, Direct: 0 mg/dL (ref 0.0–0.3)
Calcium: 11 mg/dL — ABNORMAL HIGH (ref 8.4–10.5)
Cholesterol: 242 mg/dL — ABNORMAL HIGH (ref 0–200)
GFR calc non Af Amer: 73.84 mL/min (ref >60)
HCT: 47.8 % — ABNORMAL HIGH (ref 36.0–46.0)
HDL: 38.3 mg/dL — ABNORMAL LOW (ref >39.00)
INR: 1 (ref 0.8–1.0)
Lymphs Abs: 2.3 10*3/uL (ref 0.7–4.0)
Monocytes Absolute: 0.4 10*3/uL (ref 0.1–1.0)
Monocytes Relative: 5.1 % (ref 3.0–12.0)
Neutrophils Relative %: 60.3 % (ref 43.0–77.0)
Platelets: 204 10*3/uL (ref 150.0–400.0)
Potassium: 4.1 meq/L (ref 3.5–5.1)
RDW: 12.4 % (ref 11.5–14.6)
Sodium: 144 meq/L (ref 135–145)
Triglycerides: 255 mg/dL — ABNORMAL HIGH (ref 0.0–149.0)
VLDL: 51 mg/dL — ABNORMAL HIGH (ref 0.0–40.0)
WBC: 7.1 10*3/uL (ref 4.5–10.5)

## 2009-09-29 ENCOUNTER — Telehealth (INDEPENDENT_AMBULATORY_CARE_PROVIDER_SITE_OTHER): Payer: Self-pay | Admitting: *Deleted

## 2009-10-08 ENCOUNTER — Encounter (HOSPITAL_COMMUNITY): Admission: RE | Admit: 2009-10-08 | Discharge: 2009-11-07 | Payer: Self-pay | Admitting: Oncology

## 2009-10-08 ENCOUNTER — Ambulatory Visit (HOSPITAL_COMMUNITY): Payer: Self-pay | Admitting: Oncology

## 2009-10-14 ENCOUNTER — Telehealth: Payer: Self-pay | Admitting: Internal Medicine

## 2009-10-20 ENCOUNTER — Telehealth: Payer: Self-pay | Admitting: Internal Medicine

## 2009-10-20 ENCOUNTER — Ambulatory Visit (HOSPITAL_COMMUNITY): Admission: RE | Admit: 2009-10-20 | Discharge: 2009-10-20 | Payer: Self-pay | Admitting: Oncology

## 2009-10-24 ENCOUNTER — Ambulatory Visit: Payer: Self-pay | Admitting: Internal Medicine

## 2009-10-24 ENCOUNTER — Encounter (HOSPITAL_COMMUNITY): Payer: Self-pay | Admitting: Oncology

## 2009-10-27 ENCOUNTER — Telehealth: Payer: Self-pay | Admitting: Internal Medicine

## 2009-10-28 ENCOUNTER — Ambulatory Visit (HOSPITAL_COMMUNITY): Admission: RE | Admit: 2009-10-28 | Discharge: 2009-10-28 | Payer: Self-pay | Admitting: Oncology

## 2009-10-31 ENCOUNTER — Ambulatory Visit: Payer: Self-pay | Admitting: Internal Medicine

## 2009-10-31 ENCOUNTER — Telehealth: Payer: Self-pay | Admitting: Internal Medicine

## 2009-11-10 ENCOUNTER — Telehealth: Payer: Self-pay | Admitting: Internal Medicine

## 2009-12-08 ENCOUNTER — Encounter: Payer: Self-pay | Admitting: Internal Medicine

## 2010-01-05 ENCOUNTER — Encounter: Payer: Self-pay | Admitting: Internal Medicine

## 2010-01-20 ENCOUNTER — Telehealth: Payer: Self-pay | Admitting: Internal Medicine

## 2010-01-22 ENCOUNTER — Ambulatory Visit: Payer: Self-pay | Admitting: Internal Medicine

## 2010-01-29 ENCOUNTER — Telehealth: Payer: Self-pay | Admitting: Internal Medicine

## 2010-02-17 ENCOUNTER — Telehealth (INDEPENDENT_AMBULATORY_CARE_PROVIDER_SITE_OTHER): Payer: Self-pay | Admitting: *Deleted

## 2010-03-25 ENCOUNTER — Telehealth: Payer: Self-pay | Admitting: Internal Medicine

## 2010-04-09 ENCOUNTER — Telehealth: Payer: Self-pay | Admitting: *Deleted

## 2010-07-31 ENCOUNTER — Ambulatory Visit: Payer: Self-pay | Admitting: Internal Medicine

## 2010-07-31 LAB — CONVERTED CEMR LAB
Basophils Absolute: 0 10*3/uL (ref 0.0–0.1)
Eosinophils Absolute: 0.1 10*3/uL (ref 0.0–0.7)
HCT: 46.4 % — ABNORMAL HIGH (ref 36.0–46.0)
Hgb A1c MFr Bld: 6.3 % (ref 4.6–6.5)
Lymphs Abs: 3.1 10*3/uL (ref 0.7–4.0)
MCHC: 34.2 g/dL (ref 30.0–36.0)
MCV: 91.3 fL (ref 78.0–100.0)
Monocytes Absolute: 0.4 10*3/uL (ref 0.1–1.0)
Platelets: 244 10*3/uL (ref 150.0–400.0)
RDW: 12.6 % (ref 11.5–14.6)

## 2010-08-01 ENCOUNTER — Encounter: Payer: Self-pay | Admitting: Internal Medicine

## 2010-08-01 LAB — CONVERTED CEMR LAB: PTH: 128.3 pg/mL — ABNORMAL HIGH (ref 14.0–72.0)

## 2010-09-13 ENCOUNTER — Encounter: Payer: Self-pay | Admitting: Interventional Radiology

## 2010-09-22 NOTE — Letter (Signed)
Summary: Letter with Patient Concerns  Letter with Patient Concerns   Imported By: Lanelle Bal 10/29/2009 12:26:16  _____________________________________________________________________  External Attachment:    Type:   Image     Comment:   External Document
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
## 2010-09-22 NOTE — Progress Notes (Signed)
Summary: regarding soma  Phone Note From Pharmacy   Caller: [facility] Call For: Dr. Alphonsus Sias  Reason for Call: Medication not on formulary Summary of Call: Call from [facility], they want to confirm that pt is taking both 250 and 350 mg's of soma.  Scripts for both were sent in in december.  It looks like she may have changed to another doctor now.  [facility]'s number is [phone], Liborio Nixon. Initial call taken by: Lowella Petties CMA,  January 29, 2010 9:42 AM  Follow-up for Phone Call        she has used both doses in the past She apparently has changed doctors so I will no longer be prescribing for her Follow-up by: [name] MD,  January 29, 2010 12:57 PM  Additional Follow-up for Phone Call Additional follow up Details #1::        spoke with Liborio Nixon pharmacist at [facility] and she will tell pt to get new rx's from her new primary physican Additional Follow-up by: Mervin Hack CMA Duncan Dull),  January 29, 2010 2:03 PM

## 2010-09-22 NOTE — Progress Notes (Signed)
Summary: patient upset with cost of meds want to change md.  Phone Note Call from Patient   Caller: Patient Details for Reason: Patient stated she did not make appt. Summary of Call: Patient stated she does not want to see a doctor that will not allow her and her spouse to get medicine from [facility] . Patient stated the doctor does not seem concerned with her having to decide on purchasing groceries or medicine. Patient stated prescript is $70 and she can not afford it. Patien stated doctor does not care whether she eats or not and she will find another physician who will let her get her meds through [facility]. Initial call taken by: Daphane Shepherd,  March 25, 2010 1:57 PM     Appended Document: patient upset with cost of meds want to change md. Signed in error without routing to MD

## 2010-09-22 NOTE — Assessment & Plan Note (Signed)
Summary: FOLLOW UP / LFW   Vital Signs:  Patient profile:   77 year old female Weight:      171 pounds Temp:     98.5 degrees F oral Pulse rate:   109 / minute Pulse rhythm:   regular BP sitting:   160 / 90  (left arm) Cuff size:   regular  Vitals Entered By: Mervin Hack CMA Duncan Dull) (October 24, 2009 12:30 PM) CC: 6 month follow-up   History of Present Illness: Had procedure done in [place]  ~5 weeks ago on cervical spine Then had biopsy of L1 done at [facility] for cancer  Has lost height---may have other procedures done due to this She is vague about this Has been recommended that she take reclast was started on fosamax also discussed the hyperparathyroidism will need eval of this before any Rx for the osteoporosis  See letter from husband hard to bring this up while maintaining confidentiality note ongoing disputes documented in my chart  BP went up very high during recent procedure at [facility] she feels this is just the stress Mom died recently, had to put dog to sleep, etc  Discussed my discomfort with the valium and muscle relaxers Using it for "spikes in my blood pressure"  Allergies: 1)  ! Iodine Tincture 2)  ! Aleve (Naproxen Sodium) 3)  ! Anectine (Succinylcholine Chloride) 4)  ! Vicodin (Hydrocodone-Acetaminophen) 5)  ! Oxycontin (Oxycodone Hcl) 6)  ! Ultram (Tramadol Hcl) 7)  ! Sudafed (Pseudoephedrine Hcl) 8)  ! * Elmiron  Past History:  Past medical, surgical, family and social histories (including risk factors) reviewed for relevance to current acute and chronic problems.  Past Medical History: Reviewed history from 05/09/2009 and no changes required. Spinal stenosis---------------------------------------------------Dr Kirstens Hyperlipidemia Breast cancer----------------------------------------------------Dr Vincente Poli Interstitial cystitis-------------------------------------------------Dr Logan Bores Anxiety Depression Diverticulosis,  colon Hx. of adenomatous colon polyps Hypertension Hemorrhoids Impaired fasting glucose Osteoporosis Hyperparathyroidism  Past Surgical History: Back surgery December 2007 in [place] Back surgery again in March of 2008 1981 right modified radical mastectomy Cholecystectomy 2004 Hysterectomy 1/11  Cervical spine procedure in [place]  Family History: Reviewed history from 01/06/2009 and no changes required. Father: Deceased, liver disease Mother:  Siblings: One sister- multiple problems, CAD, HTN, asthma Breast cancer  in patient t and Mat Aunt No FH of Colon Cancer:  Social History: Reviewed history from 12/14/2007 and no changes required. Married Former Smoker--quit 1979 Alcohol use-no Hasn't worked outside of home since marriage. Travels with husband   Impression & Recommendations:  Problem # 1:  HYPERTENSION (ICD-401.9) Assessment Unchanged told her I would not give her valium anymore if it if was for HTN she is resistant to any BP meds 30 minute visit---all in counselling  BP today: 160/90 Prior BP: 150/90 (08/27/2009)  Labs Reviewed: K+: 4.1 (08/27/2009) Creat: : 0.8 (08/27/2009)   Chol: 242 (08/27/2009)   HDL: 38.30 (08/27/2009)   LDL: 125 (12/15/2006)   TG: 255.0 (08/27/2009)  Problem # 2:  HYPERCALCEMIA (ICD-275.42) Assessment: Comment Only  fits diagnosis of primary hyperparathyroidism will set up endocrine eval to look into Rx for this  Orders: Endocrinology Referral (Endocrine)  Complete Medication List: 1)  Tylenol Extra Strength 500 Mg Tabs (Acetaminophen) .... As needed  every 5 to 6 hours 2)  Soma 250 Mg Tabs (Carisoprodol) .Marland Kitchen.. 1 three times a day as needed 3)  Soma 350 Mg Tabs (Carisoprodol) .Marland Kitchen.. 1 at bedtime as needed 4)  Sombra Cool Therapy 6 % Gel (Menthol (topical analgesic)) .... As needed 5)  Estrace 0.1 Mg/gm Crea (Estradiol) .... 3-5 x weekly 6)  Valium 5 Mg Tabs (Diazepam) .Marland Kitchen.. 1 tab three times a day as needed for nerves or muscle  spasm. brand name only 7)  Systane 0.4-0.3 % Soln (Polyethyl glycol-propyl glycol) .Marland Kitchen.. 1-2 drops as needed 8)  Percocet 5-325 Mg Tabs (Oxycodone-acetaminophen) .Marland Kitchen.. 1 daily as needed for severe pain 9)  Anusol-hc 25 Mg Supp (Hydrocortisone acetate) .... Use 1 suppository before bedtime x 10 days. 10)  Prometrium 100 Mg Caps (Progesterone micronized) .... Take 1 by mouth at bedtime 11)  Red Yeast Rice 600 Mg Caps (Red yeast rice extract) .... Take 1 by mouth once daily 12)  Vistaril 25 Mg Caps (Hydroxyzine pamoate) .... One by mouth daily 13)  Vitamin E Crea (Vitamin e) .... As needed  Patient Instructions: 1)  Please take valium only once a day till it runs out 2)  Referral Appointment Information 3)  Day/Date: 4)  Time: 5)  Place/MD: 6)  Address: 7)  Phone/Fax: 8)  Patient given appointment information. Information/Orders faxed/mailed. 9)  Please schedule a follow-up appointment in 1 month.   Current Allergies (reviewed today): ! IODINE TINCTURE ! ALEVE (NAPROXEN SODIUM) ! ANECTINE (SUCCINYLCHOLINE CHLORIDE) ! VICODIN (HYDROCODONE-ACETAMINOPHEN) ! OXYCONTIN (OXYCODONE HCL) ! ULTRAM (TRAMADOL HCL) ! SUDAFED (PSEUDOEPHEDRINE HCL) ! Coralyn Pear

## 2010-09-22 NOTE — Letter (Signed)
Summary: [facility]   Imported By: Sherian Rein 12/23/2009 14:05:48  _____________________________________________________________________  External Attachment:    Type:   Image     Comment:   External Document

## 2010-09-22 NOTE — Letter (Signed)
Summary: Letter Regarding Spinal Surgery/[facility]  Letter Regarding Spinal Surgery/[facility]   Imported By: Lanelle Bal 08/26/2009 08:29:50  _____________________________________________________________________  External Attachment:    Type:   Image     Comment:   External Document

## 2010-09-22 NOTE — Progress Notes (Signed)
Summary: valium  Phone Note Call from Patient Call back at Home Phone [phone]   Caller: Patient Call For: [name] MD Summary of Call: Para March says her valium says that she takes it for anxiety and muscle spasms. She says that she does not want that to be on her medical record. She says that she wants it to say she takes if for her bp. She says that when she get upset her bp spikes. I explained to her that valium is not a bp medication. She insist that it say she take it due to her bp. She has an app on Friday and I told her she could discuss it with you.  Initial call taken by: Melody Comas,  October 20, 2009 9:42 AM  Follow-up for Phone Call        that is not an appropriate request I will review this with her at her appt Follow-up by: [name] MD,  October 20, 2009 1:19 PM

## 2010-09-22 NOTE — Progress Notes (Signed)
Summary: PERCOCET  Phone Note Call from Patient Call back at Home Phone [phone]   Summary of Call: Percocet must be mailed to pharmacy[facility]. Pt would like to pick up rx and mail in herself.  Initial call taken by: Lamar Sprinkles, CMA,  November 10, 2009 11:09 AM  Follow-up for Phone Call        I decline , as this would result in a very large amount of controlled substance which most doctors do not feel comfortable with;  exceptions are usually made for the percocet as the local pharmacy  Additional Follow-up for Phone Call Additional follow up Details #1::        left message on machine to call back to office. Additional Follow-up by: Lucious Groves,  November 10, 2009 3:01 PM    Additional Follow-up for Phone Call Additional follow up Details #2::    Patient gets mail order meds for controlled meds also. At last office visit percocet was faxed from our office, this med must be mailed in by patient. Dr Jonny Ruiz is aware and reprinted rx for pt to pick up tomorrow. ..........................Marland KitchenLamar Sprinkles, CMA  November 10, 2009 6:23 PM   Prescriptions: PERCOCET 5-325 MG  TABS (OXYCODONE-ACETAMINOPHEN) 1 daily as needed for severe pain Brand medically necessary #90 x 0   Entered and Authorized by:   Corwin Levins MD   Signed by:   Corwin Levins MD on 11/10/2009   Method used:   Print then Give to Patient   RxID:   [id number]  done hardcopy to LIM side B - dahlia  Corwin Levins MD  November 10, 2009 6:02 PM   pt informed, rx in cabinet for pick up Margaret Pyle, CMA  November 11, 2009 9:00 AM

## 2010-09-22 NOTE — Assessment & Plan Note (Signed)
Summary: DISCUSS MEDS PER TRIAGE/NWS  #   Vital Signs:  Patient profile:   77 year old female Height:      64 inches Weight:      170 pounds BMI:     29.29 O2 Sat:      93 % on Room air Temp:     98.3 degrees F oral Pulse rate:   110 / minute BP sitting:   142 / 94  (left arm) Cuff size:   regular  Vitals Entered ByZella Ball Ewing (January 22, 2010 4:05 PM)  O2 Flow:  Room air CC: Discuss medications/RE   Primary Care Provider:  Corwin Levins MD  CC:  Discuss medications/RE.  History of Present Illness: pt here specifically to obtain 3 mo rx of controlled meds, though I stated over the phone that I wouldwould not do this.  She has a lengthy expolanation regarding her request and how she is not prone to addiction.  Pt denies CP, sob, doe, wheezing, orthopnea, pnd, worsening LE edema, palps, dizziness or syncope   Pt denies new neuro symptoms such as headache, facial or extremity weakness   Does have significant itching adn requests the vistaril.   Back pain overall no change, without worsening freq or severity of symptoms, bowel or bladder change, worsening LE pain, numbness, weakness, fall or gait change, or fever, wt loss.    Problems Prior to Update: 1)  Preventive Health Care  (ICD-V70.0) 2)  Preoperative Examination  (ICD-V72.84) 3)  Liver Mass  (ICD-235.3) 4)  Osteoporosis  (ICD-733.00) 5)  Polycythemia  (ICD-289.0) 6)  Hypercalcemia  (ICD-275.42) 7)  Constipation  (ICD-564.00) 8)  Irritable Bowel Syndrome  (ICD-564.1) 9)  Hemorrhoids-external  (ICD-455.3) 10)  Impaired Fasting Glucose  (ICD-790.21) 11)  Adenomatous Colonic Polyps, Hx of  (ICD-V12.72) 12)  Fatigue  (ICD-780.79) 13)  Thoracic Sprain and Strain  (ICD-847.1) 14)  Breast Cancer, Hx of  (ICD-V10.3) 15)  Diverticulosis, Colon  (ICD-562.10) 16)  Hemorrhoids, Internal  (ICD-455.0) 17)  Hemorrhoids, External  (ICD-455.3) 18)  Depression  (ICD-311) 19)  Anxiety  (ICD-300.00) 20)  Hypertension  (ICD-401.9) 21)   Interstitial Cystitis  (ICD-595.1) 22)  Paresthesia  (ICD-782.0) 23)  Back Pain  (ICD-724.5) 24)  Hyperlipidemia Nec/nos  (ICD-272.4) 25)  Stenosis, Spinal, Unspc Region  (ICD-724.00)  Medications Prior to Update: 1)  Tylenol Extra Strength 500 Mg  Tabs (Acetaminophen) .... As Needed  Every 5 To 6 Hours 2)  Soma 250 Mg  Tabs (Carisoprodol) .Marland Kitchen.. 1 Three Times A Day As Needed 3)  Soma 350 Mg  Tabs (Carisoprodol) .Marland Kitchen.. 1 At Bedtime As Needed 4)  Sombra Cool Therapy 6 %  Gel (Menthol (Topical Analgesic)) .... As Needed 5)  Estrace 0.1 Mg/gm  Crea (Estradiol) .... 3-5 X Weekly 6)  Valium 5 Mg  Tabs (Diazepam) .Marland Kitchen.. 1 Tab Three Times A Day As Needed For Nerves or Muscle Spasm. Brand Name Only 7)  Systane 0.4-0.3 %  Soln (Polyethyl Glycol-Propyl Glycol) .Marland Kitchen.. 1-2 Drops As Needed 8)  Percocet 5-325 Mg  Tabs (Oxycodone-Acetaminophen) .Marland Kitchen.. 1 Daily As Needed For Severe Pain 9)  Anusol-Hc 25 Mg Supp (Hydrocortisone Acetate) .... Use 1 Suppository Before Bedtime X 10 Days. 10)  Prometrium 100 Mg Caps (Progesterone Micronized) .... Take 1 By Mouth At Bedtime 11)  Red Yeast Rice 600 Mg Caps (Red Yeast Rice Extract) .... Take 1 By Mouth Once Daily 12)  Vistaril 25 Mg Caps (Hydroxyzine Pamoate) .... One By Mouth Daily 13)  Vitamin E  Crea (Vitamin E) .... As Needed  Current Medications (verified): 1)  Tylenol Extra Strength 500 Mg  Tabs (Acetaminophen) .... As Needed  Every 5 To 6 Hours 2)  Soma 250 Mg  Tabs (Carisoprodol) .Marland Kitchen.. 1 Three Times A Day As Needed 3)  Soma 350 Mg  Tabs (Carisoprodol) .Marland Kitchen.. 1 At Bedtime As Needed 4)  Sombra Cool Therapy 6 %  Gel (Menthol (Topical Analgesic)) .... As Needed 5)  Estrace 0.1 Mg/gm  Crea (Estradiol) .... 3-5 X Weekly 6)  Valium 5 Mg  Tabs (Diazepam) .Marland Kitchen.. 1 Tab Three Times A Day As Needed For Nerves or Muscle Spasm. Brand Name Only 7)  Systane 0.4-0.3 %  Soln (Polyethyl Glycol-Propyl Glycol) .Marland Kitchen.. 1-2 Drops As Needed 8)  Percocet 5-325 Mg  Tabs (Oxycodone-Acetaminophen)  .Marland Kitchen.. 1 Daily As Needed For Severe Pain 9)  Anusol-Hc 25 Mg Supp (Hydrocortisone Acetate) .... Use 1 Suppository Before Bedtime X 10 Days. 10)  Prometrium 100 Mg Caps (Progesterone Micronized) .... Take 1 By Mouth At Bedtime 11)  Red Yeast Rice 600 Mg Caps (Red Yeast Rice Extract) .... Take 1 By Mouth Once Daily 12)  Vistaril 25 Mg Caps (Hydroxyzine Pamoate) .... One By Mouth Daily 13)  Vitamin E  Crea (Vitamin E) .... As Needed 14)  Vistaril 25 Mg Caps (Hydroxyzine Pamoate) .Marland Kitchen.. 1-2 By Mouth At Bedtime As Needed  Allergies (verified): 1)  ! Iodine Tincture 2)  ! Aleve (Naproxen Sodium) 3)  ! Anectine (Succinylcholine Chloride) 4)  ! Vicodin (Hydrocodone-Acetaminophen) 5)  ! Oxycontin (Oxycodone Hcl) 6)  ! Ultram (Tramadol Hcl) 7)  ! Sudafed (Pseudoephedrine Hcl) 8)  ! * Elmiron  Past History:  Past Medical History: Last updated: 10/31/2009 Spinal stenosis---------------------------------------------------Dr Kirstens Hyperlipidemia Breast cancer----------------------------------------------------Dr Vincente Poli Interstitial cystitis-------------------------------------------------Dr Logan Bores Anxiety Depression Diverticulosis, colon Hx. of adenomatous colon polyps Hypertension Hemorrhoids Impaired fasting glucose Osteoporosis Hyperparathyroidism  Past Surgical History: Last updated: 10/31/2009 Back surgery December 2007 in [place] Back surgery again in March of 2008 1981 right modified radical mastectomy Cholecystectomy 2004 Hysterectomy 1/11  Cervical spine procedure in [facility] s/p march 2011 lumbar kyphoplasty s/p cervical surgury for stenosis - dr Sheppard Penton  Social History: Last updated: 10/31/2009 Married Former Smoker--quit 1979 Alcohol use-no Hasn't worked outside of home since marriage. Travels with husband Drug use-no  Risk Factors: Smoking Status: quit (12/14/2007)  Review of Systems       all otherwise negative per pt -    Physical Exam  General:  alert and  overweight-appearing.   Head:  normocephalic and atraumatic.   Eyes:  vision grossly intact, pupils equal, and pupils round.   Ears:  R ear normal and L ear normal.   Nose:  no external deformity and no nasal discharge.   Mouth:  no gingival abnormalities and pharynx pink and moist.   Neck:  supple and no masses.   Lungs:  normal respiratory effort and normal breath sounds.   Heart:  normal rate and regular rhythm.   Msk:  no increased spine tender Extremities:  no edema, no erythema  Neurologic:  cranial nerves II-XII intact and strength normal in all extremities.     Impression & Recommendations:  Problem # 1:  INTERSTITIAL CYSTITIS (ICD-595.1) ok for vistaril as needed ;  it is noted that pt gave back the prescription after a discussion where I respectfully indicated I did not feel comfortable with 3 mo rx of controlled substances, as this is my policy with only rare exceptions.  She is not happy  with this, and made a statement to the staff as she left "I'll just find someone who wil treat me."    Problem # 2:  HYPERTENSION (ICD-401.9)  BP today: 142/94 Prior BP: 162/92 (10/31/2009)  Labs Reviewed: K+: 4.1 (08/27/2009) Creat: : 0.8 (08/27/2009)   Chol: 242 (08/27/2009)   HDL: 38.30 (08/27/2009)   LDL: 125 (12/15/2006)   TG: 255.0 (08/27/2009) I think she should be treated with antiHTN meds but she is not interested in this today.    Problem # 3:  BACK PAIN (ICD-724.5)  Her updated medication list for this problem includes:    Tylenol Extra Strength 500 Mg Tabs (Acetaminophen) .Marland Kitchen... As needed  every 5 to 6 hours    Soma 250 Mg Tabs (Carisoprodol) .Marland Kitchen... 1 three times a day as needed    Soma 350 Mg Tabs (Carisoprodol) .Marland Kitchen... 1 at bedtime as needed    Percocet 5-325 Mg Tabs (Oxycodone-acetaminophen) .Marland Kitchen... 1 daily as needed for severe pain stable overall by hx and exam, ok to continue meds/tx as is , I do not feel comfortable with 3 mo rx for these meds through [facility]  Complete  Medication List: 1)  Tylenol Extra Strength 500 Mg Tabs (Acetaminophen) .... As needed  every 5 to 6 hours 2)  Soma 250 Mg Tabs (Carisoprodol) .Marland Kitchen.. 1 three times a day as needed 3)  Soma 350 Mg Tabs (Carisoprodol) .Marland Kitchen.. 1 at bedtime as needed 4)  Sombra Cool Therapy 6 % Gel (Menthol (topical analgesic)) .... As needed 5)  Estrace 0.1 Mg/gm Crea (Estradiol) .... 3-5 x weekly 6)  Valium 5 Mg Tabs (Diazepam) .Marland Kitchen.. 1 tab three times a day as needed for nerves or muscle spasm. brand name only 7)  Systane 0.4-0.3 % Soln (Polyethyl glycol-propyl glycol) .Marland Kitchen.. 1-2 drops as needed 8)  Percocet 5-325 Mg Tabs (Oxycodone-acetaminophen) .Marland Kitchen.. 1 daily as needed for severe pain 9)  Anusol-hc 25 Mg Supp (Hydrocortisone acetate) .... Use 1 suppository before bedtime x 10 days. 10)  Prometrium 100 Mg Caps (Progesterone micronized) .... Take 1 by mouth at bedtime 11)  Red Yeast Rice 600 Mg Caps (Red yeast rice extract) .... Take 1 by mouth once daily 12)  Vistaril 25 Mg Caps (Hydroxyzine pamoate) .... One by mouth daily 13)  Vitamin E Crea (Vitamin e) .... As needed 14)  Vistaril 25 Mg Caps (Hydroxyzine pamoate) .Marland Kitchen.. 1-2 by mouth at bedtime as needed  Patient Instructions: 1)  Please take all new medications as prescribed 2)  Continue all previous medications as before this visit  3)  Please schedule a follow-up appointment as needed. Prescriptions: VISTARIL 25 MG CAPS (HYDROXYZINE PAMOATE) 1-2 by mouth at bedtime as needed  #180 x 3   Entered and Authorized by:   Corwin Levins MD   Signed by:   Corwin Levins MD on 01/22/2010   Method used:   Print then Give to Patient   RxID:   [id number] HYDROXYZINE HCL 25 MG TABS (HYDROXYZINE HCL) 1 -2 by mouth at bedtime as needed  #180 x 3   Entered and Authorized by:   Corwin Levins MD   Signed by:   Corwin Levins MD on 01/22/2010   Method used:   Print then Give to Patient   RxID:   [id number]

## 2010-09-22 NOTE — Progress Notes (Signed)
  Phone Note Call from Patient   Caller: Patient Summary of Call: Patient left office without scheduling Endcrinology appt. I called the patient to give her the  information about her appt with Dr Everardo All and she was very unhappy about this appt being made for her. Patient asked me to cancel the appt that I have made. I cancelled this appt per patients request.  Initial call taken by: Carlton Adam,  October 27, 2009 9:05 AM  Follow-up for Phone Call        this is her choice I have expressed my concerns about her direction with her medical care and she will have to decide if she wishes to follow my directions or find another physician Follow-up by: [name] MD,  October 29, 2009 10:56 AM

## 2010-09-22 NOTE — Assessment & Plan Note (Signed)
Summary: PT NEEDS LABS AND EKG/DS   Vital Signs:  Patient profile:   77 year old female Height:      64.5 inches Weight:      170.8 pounds BMI:     28.97 Temp:     98.4 degrees F oral Pulse rate:   90 / minute Pulse rhythm:   regular BP sitting:   150 / 90  (left arm) Cuff size:   regular  Vitals Entered By: Benny Lennert CMA Duncan Dull) (August 27, 2009 8:55 AM)  History of Present Illness: Chief complaint slearence for surgery  Upcoming minimally invasive cervical, possible lumbar surgery via laser...via concious sedation, no intubation. Dx is spinal stenosis Has had similar surgery in lumbar spine 2007 and compression fracture kyphoplasty 2008  No past history of heart issues...sister with MI early age...stress low risk test over 5-10 years ago. No chest pain, no SOB. Limited exercise due to foot pain.  HTN. ..per pt white coat hypertension...she states Alphonsus Sias gives her Valium to control it.   11/14/2008 last CPE with Dr. Alphonsus Sias. Last chol 2008.  Hx of breast cancer. Liver mass appeared benign per PET.  Current Medications (verified): 1)  Tylenol Extra Strength 500 Mg  Tabs (Acetaminophen) .... As Needed  Every 5 To 6 Hours 2)  Soma 250 Mg  Tabs (Carisoprodol) .Marland Kitchen.. 1 Three Times A Day As Needed 3)  Soma 350 Mg  Tabs (Carisoprodol) .Marland Kitchen.. 1 At Bedtime As Needed 4)  Sombra Cool Therapy 6 %  Gel (Menthol (Topical Analgesic)) .... As Needed 5)  Estrace 0.1 Mg/gm  Crea (Estradiol) .... 3-5 X Weekly 6)  Valium 5 Mg  Tabs (Diazepam) .Marland Kitchen.. 1 Tab Three Times A Day As Needed For Nerves or Muscle Spasm. Brand Name Only 7)  Systane 0.4-0.3 %  Soln (Polyethyl Glycol-Propyl Glycol) .Marland Kitchen.. 1-2 Drops As Needed 8)  Percocet 5-325 Mg  Tabs (Oxycodone-Acetaminophen) .Marland Kitchen.. 1 Daily As Needed For Severe Pain 9)  Anusol-Hc 25 Mg Supp (Hydrocortisone Acetate) .... Use 1 Suppository Before Bedtime X 10 Days. 10)  Prometrium 100 Mg Caps (Progesterone Micronized) .... Take 1 By Mouth At Bedtime 11)  Red  Yeast Rice 600 Mg Caps (Red Yeast Rice Extract) .... Take 1 By Mouth Once Daily 12)  Vistaril 25 Mg Caps (Hydroxyzine Pamoate) .... One By Mouth Daily  Allergies: 1)  ! Iodine Tincture (Iodine) 2)  ! Aleve (Naproxen Sodium) 3)  ! Anectine (Succinylcholine Chloride) 4)  ! Vicodin (Hydrocodone-Acetaminophen) 5)  ! Oxycontin (Oxycodone Hcl) 6)  ! Ultram (Tramadol Hcl) 7)  ! Sudafed (Pseudoephedrine Hcl) 8)  ! * Elmiron  Review of Systems General:  Denies fatigue and fever. CV:  Denies chest pain or discomfort. Resp:  Denies shortness of breath. GI:  Denies abdominal pain, bloody stools, constipation, and diarrhea. GU:  Denies dysuria.  Physical Exam  General:  overweight female IN NAD Ears:  External ear exam shows no significant lesions or deformities.  Otoscopic examination reveals clear canals, tympanic membranes are intact bilaterally without bulging, retraction, inflammation or discharge. Hearing is grossly normal bilaterally. Nose:  External nasal examination shows no deformity or inflammation. Nasal mucosa are pink and moist without lesions or exudates. Mouth:  Oral mucosa and oropharynx without lesions or exudates.  Teeth in good repair. Neck:  no carotid bruit or thyromegaly no cervical or supraclavicular lymphadenopathy  Lungs:  Normal respiratory effort, chest expands symmetrically. Lungs are clear to auscultation, no crackles or wheezes. Heart:  Normal rate and regular rhythm. S1  and S2 normal without gallop, murmur, click, rub or other extra sounds. Abdomen:  Bowel sounds positive,abdomen soft and non-tender without masses, organomegaly or hernias noted. Pulses:  R and L posterior tibial pulses are full and equal bilaterally  Extremities:  no edema Skin:  Intact without suspicious lesions or rashes   Impression & Recommendations:  Problem # 1:  PREOPERATIVE EXAMINATION (ICD-V72.84) Minimally invasive surgery..EKG stable from 2002. NO chronic or acute respiratory  issues.  Will await labs for further recommendations.   Problem # 2:  HYPERTENSION (ICD-401.9) ? white coat HTN..follow at home call if above goal 140/90.   Problem # 3:  HYPERLIPIDEMIA NEC/NOS (ICD-272.4) OVer due for yearly eval.  Orders: TLB-Lipid Panel (80061-LIPID)  Complete Medication List: 1)  Tylenol Extra Strength 500 Mg Tabs (Acetaminophen) .... As needed  every 5 to 6 hours 2)  Soma 250 Mg Tabs (Carisoprodol) .Marland Kitchen.. 1 three times a day as needed 3)  Soma 350 Mg Tabs (Carisoprodol) .Marland Kitchen.. 1 at bedtime as needed 4)  Sombra Cool Therapy 6 % Gel (Menthol (topical analgesic)) .... As needed 5)  Estrace 0.1 Mg/gm Crea (Estradiol) .... 3-5 x weekly 6)  Valium 5 Mg Tabs (Diazepam) .Marland Kitchen.. 1 tab three times a day as needed for nerves or muscle spasm. brand name only 7)  Systane 0.4-0.3 % Soln (Polyethyl glycol-propyl glycol) .Marland Kitchen.. 1-2 drops as needed 8)  Percocet 5-325 Mg Tabs (Oxycodone-acetaminophen) .Marland Kitchen.. 1 daily as needed for severe pain 9)  Anusol-hc 25 Mg Supp (Hydrocortisone acetate) .... Use 1 suppository before bedtime x 10 days. 10)  Prometrium 100 Mg Caps (Progesterone micronized) .... Take 1 by mouth at bedtime 11)  Red Yeast Rice 600 Mg Caps (Red yeast rice extract) .... Take 1 by mouth once daily 12)  Vistaril 25 Mg Caps (Hydroxyzine pamoate) .... One by mouth daily  Other Orders: EKG w/ Interpretation (93000) TLB-CBC Platelet - w/Differential (85025-CBCD) TLB-BMP (Basic Metabolic Panel-BMET) (80048-METABOL) TLB-Hepatic/Liver Function Pnl (80076-HEPATIC) TLB-PTT (85730-PTTL) TLB-PT (Protime) (85610-PTP)  Patient Instructions: 1)  Follow BPs at home.  Call if consistently >140/90.  Current Allergies (reviewed today): ! IODINE TINCTURE (IODINE) ! ALEVE (NAPROXEN SODIUM) ! ANECTINE (SUCCINYLCHOLINE CHLORIDE) ! VICODIN (HYDROCODONE-ACETAMINOPHEN) ! OXYCONTIN (OXYCODONE HCL) ! ULTRAM (TRAMADOL HCL) ! SUDAFED (PSEUDOEPHEDRINE HCL) ! Coralyn Pear

## 2010-09-22 NOTE — Progress Notes (Signed)
Summary: RE-EST PATIENT  Phone Note Call from Patient   Caller: Patient Reason for Call: Acute Illness Summary of Call: Pt present at the office to be restablished as a new patient - appointment date: 07/31/2010 - pt was informed there may be conditions for her to return as a  patient.Marland Kitchen also  FYI there are certain medication needs. Initial call taken by: Roney Jaffe,  April 09, 2010 12:57 PM  Follow-up for Phone Call        dr Lovell Sheehan ok'd this Follow-up by: Willy Eddy, LPN,  April 10, 2010 8:05 AM

## 2010-09-22 NOTE — Progress Notes (Signed)
Summary: pt wants to be reimbursed  Phone Note Call from Patient Call back at Home Phone [phone]   Caller: Patient Summary of Call: Pt is upset that she got generic vistaril from [facility], and only got a 30 day supply.  This was sent in in december for 30 , BMN not specified.  I re- sent this in today for her.  She is asking to be reembursed the $3. 42 for the supply that was sent in error.  Please call her. Initial call taken by: Lowella Petties CMA,  September 29, 2009 10:54 AM  Follow-up for Phone Call        Jamesetta So, this note was sent back to me.  Have you called her?        Lowella Petties CMA  October 13, 2009 4:40 PM  Follow-up by: Clarisa Schools,  October 14, 2009 2:20 PM  Additional Follow-up for Phone Call Additional follow up Details #1::        Talked with patient and talked with her.  Please document that pt only takes brand name and does not take generic.  She also wants the 90 day refill rather than 30 day when possible for meds.   Additional Follow-up by: Clarisa Schools,  October 14, 2009 2:25 PM    Additional Follow-up for Phone Call Additional follow up Details #2::    Documented this information in the registration box.     Lowella Petties CMA  October 14, 2009 3:01 PM
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
## 2010-09-22 NOTE — Progress Notes (Signed)
Summary: LABS AND EKG  Phone Note From Other Clinic Call back at [phone] ex [phone]   Caller: Patient Call For: [facility]  Caller: [facility] Call For: [facility]  Summary of Call: [facility] called and is requesting that the results for the  labs and the EKG that was done on the 5th for clearance  be faxed to [phone]. Initial call taken by: Melody Comas,  August 29, 2009 4:21 PM  Follow-up for Phone Call        Please fax the EKG and labs to :[facility] The chol and mild gluocse elevation are not of sig concern at this point and we can review them after she has her procedure Follow-up by: [name] MD,  August 29, 2009 8:20 PM  Additional Follow-up for Phone Call Additional follow up Details #1::        labs and EKG sent Additional Follow-up by: Mervin Hack CMA (AAMA),  September 01, 2009 9:30 AM

## 2010-09-22 NOTE — Letter (Signed)
Summary: [facility]   Imported By: Sherian Rein 01/27/2010 09:40:33  _____________________________________________________________________  External Attachment:    Type:   Image     Comment:   External Document

## 2010-09-22 NOTE — Progress Notes (Signed)
  Phone Note Other Incoming   Request: Send information Summary of Call: Request for records received from [facility]. Request forwarded to [employer].

## 2010-09-22 NOTE — Progress Notes (Signed)
Summary: Pt Hx/Patient  Pt Hx/Patient   Imported By: Sherian Rein 02/02/2010 13:05:14  _____________________________________________________________________  External Attachment:    Type:   Image     Comment:   External Document

## 2010-09-22 NOTE — Progress Notes (Signed)
Summary: refills needed on valium and prometrium  Phone Note Refill Request Message from:  Patient  Refills Requested: Medication #1:  VALIUM 5 MG  TABS 1 tab three times a day as needed for nerves or muscle spasm. BRAND NAME ONLY [BMN]   Last Refilled: 07/07/2009  Medication #2:  PROMETRIUM 100 MG CAPS take 1 by mouth at bedtime   Last Refilled: 07/15/2009 Pt needs 90 day scripts sent to [facility].  Advised her we may not be able to do 90 days on the valium, or we may not be able to give refills.  Initial call taken by: Lowella Petties CMA,  October 14, 2009 2:46 PM  Follow-up for Phone Call        Please let her know prometrium sent 3 month for valium written Follow-up by: [name] MD,  October 15, 2009 9:10 AM  Additional Follow-up for Phone Call Additional follow up Details #1::        Spoke with patient and advised rx ready for pick-up  Additional Follow-up by: Mervin Hack CMA Duncan Dull),  October 15, 2009 9:50 AM    New/Updated Medications: PROMETRIUM 100 MG CAPS (PROGESTERONE MICRONIZED) take 1 by mouth at bedtime Prescriptions: PROMETRIUM 100 MG CAPS (PROGESTERONE MICRONIZED) take 1 by mouth at bedtime  #90 x 3   Entered and Authorized by:   [name] MD   Signed by:   [name] MD on 10/15/2009   Method used:   Electronically to        [facility] (mail-order)             ,          Ph: [phone]       Fax: [phone]   RxID:   [id number] VALIUM 5 MG  TABS (DIAZEPAM) 1 tab three times a day as needed for nerves or muscle spasm. BRAND NAME ONLY Brand medically necessary #270 x 0   Entered and Authorized by:   [name] MD   Signed by:   [name] MD on 10/15/2009   Method used:   Print then Give to Patient   RxID:   [id number]

## 2010-09-22 NOTE — Progress Notes (Signed)
  Phone Note Call from Patient   Caller: Patient Summary of Call: Patient requested a copy of the office notes once completed from today's visit. Initial call taken by: Scharlene Gloss,  October 31, 2009 1:44 PM

## 2010-09-24 NOTE — Assessment & Plan Note (Signed)
Summary: pt/re-est/RCD   Vital Signs:  Patient profile:   77 year old female Height:      64 inches Weight:      176 pounds BMI:     30.32 Temp:     97.9 degrees F oral Pulse rate:   120 / minute Resp:     14 per minute BP sitting:   140 / 84  (left arm)  Vitals Entered By: Willy Eddy, LPN (July 31, 2010 11:08 AM) CC: to re-establish, Hypertension Management Is Patient Diabetic? No   Primary Care Provider:  Darryll Capers  CC:  to re-establish and Hypertension Management.  History of Present Illness: The pt is transfering from Dr Jonny Ruiz... has been seen by [place] in the past and had been on anxiety medication she has  seeing Plotzky for valium for anxiety  and has been on two a day and she feels that she needs three a day She has gained weigth ans has risks fro DM she has HTN  but wants to attribute this  to anxiety, white coat and pain.... She has a clear idea of what she wants and does not want She denies that her use of valium is in any wat attributed to "addition" She does not consider herself to be depressed. She has chronic pain...  Hypertension History:      She complains of neurologic problems, but denies headache, chest pain, palpitations, dyspnea with exertion, orthopnea, PND, peripheral edema, visual symptoms, syncope, and side effects from treatment.        Positive major cardiovascular risk factors include female age 62 years old or older, hyperlipidemia, and hypertension.  Negative major cardiovascular risk factors include non-tobacco-user status.     Preventive Screening-Counseling & Management  Alcohol-Tobacco     Smoking Status: quit     Tobacco Counseling: to remain off tobacco products  Problems Prior to Update: 1)  Preventive Health Care  (ICD-V70.0) 2)  Preoperative Examination  (ICD-V72.84) 3)  Liver Mass  (ICD-235.3) 4)  Osteoporosis  (ICD-733.00) 5)  Polycythemia  (ICD-289.0) 6)  Hypercalcemia  (ICD-275.42) 7)  Constipation   (ICD-564.00) 8)  Irritable Bowel Syndrome  (ICD-564.1) 9)  Hemorrhoids-external  (ICD-455.3) 10)  Impaired Fasting Glucose  (ICD-790.21) 11)  Adenomatous Colonic Polyps, Hx of  (ICD-V12.72) 12)  Fatigue  (ICD-780.79) 13)  Thoracic Sprain and Strain  (ICD-847.1) 14)  Breast Cancer, Hx of  (ICD-V10.3) 15)  Diverticulosis, Colon  (ICD-562.10) 16)  Hemorrhoids, Internal  (ICD-455.0) 17)  Hemorrhoids, External  (ICD-455.3) 18)  Depression  (ICD-311) 19)  Anxiety  (ICD-300.00) 20)  Hypertension  (ICD-401.9) 21)  Interstitial Cystitis  (ICD-595.1) 22)  Paresthesia  (ICD-782.0) 23)  Back Pain  (ICD-724.5) 24)  Hyperlipidemia Nec/nos  (ICD-272.4) 25)  Stenosis, Spinal, Unspc Region  (ICD-724.00)  Medications Prior to Update: 1)  Tylenol Extra Strength 500 Mg  Tabs (Acetaminophen) .... As Needed  Every 5 To 6 Hours 2)  Soma 250 Mg  Tabs (Carisoprodol) .Marland Kitchen.. 1 Three Times A Day As Needed 3)  Soma 350 Mg  Tabs (Carisoprodol) .Marland Kitchen.. 1 At Bedtime As Needed 4)  Sombra Cool Therapy 6 %  Gel (Menthol (Topical Analgesic)) .... As Needed 5)  Estrace 0.1 Mg/gm  Crea (Estradiol) .... 3-5 X Weekly 6)  Valium 5 Mg  Tabs (Diazepam) .Marland Kitchen.. 1 Tab Three Times A Day As Needed For Nerves or Muscle Spasm. Brand Name Only 7)  Systane 0.4-0.3 %  Soln (Polyethyl Glycol-Propyl Glycol) .Marland Kitchen.. 1-2 Drops As Needed 8)  Percocet 5-325 Mg  Tabs (Oxycodone-Acetaminophen) .Marland Kitchen.. 1 Daily As Needed For Severe Pain 9)  Anusol-Hc 25 Mg Supp (Hydrocortisone Acetate) .... Use 1 Suppository Before Bedtime X 10 Days. 10)  Prometrium 100 Mg Caps (Progesterone Micronized) .... Take 1 By Mouth At Bedtime 11)  Red Yeast Rice 600 Mg Caps (Red Yeast Rice Extract) .... Take 1 By Mouth Once Daily 12)  Vistaril 25 Mg Caps (Hydroxyzine Pamoate) .... One By Mouth Daily 13)  Vitamin E  Crea (Vitamin E) .... As Needed 14)  Vistaril 25 Mg Caps (Hydroxyzine Pamoate) .Marland Kitchen.. 1-2 By Mouth At Bedtime As Needed  Current Medications (verified): 1)  Tylenol  Extra Strength 500 Mg  Tabs (Acetaminophen) .... As Needed  Every 5 To 6 Hours 2)  Soma 250 Mg  Tabs (Carisoprodol) .Marland Kitchen.. 1 Three Times A Day As Needed 3)  Soma 350 Mg  Tabs (Carisoprodol) .Marland Kitchen.. 1 At Bedtime As Needed 4)  Sombra Cool Therapy 6 %  Gel (Menthol (Topical Analgesic)) .... As Needed 5)  Valium 5 Mg  Tabs (Diazepam) .Marland Kitchen.. 1 Tab Three Times A Day As Needed For Nerves or Muscle Spasm. Brand Name Only 6)  Systane 0.4-0.3 %  Soln (Polyethyl Glycol-Propyl Glycol) .Marland Kitchen.. 1-2 Drops As Needed 7)  Percocet 5-325 Mg  Tabs (Oxycodone-Acetaminophen) .Marland Kitchen.. 1 Daily As Needed For Severe Pain 8)  Anusol-Hc 25 Mg Supp (Hydrocortisone Acetate) .... Use 1 Suppository Before Bedtime X 10 Days. 9)  Prometrium 100 Mg Caps (Progesterone Micronized) .... Take 1 By Mouth At Bedtime 10)  Red Yeast Rice 600 Mg Caps (Red Yeast Rice Extract) .... Take 1 By Mouth Once Daily 11)  Vitamin E  Crea (Vitamin E) .... As Needed 12)  Vistaril 25 Mg Caps (Hydroxyzine Pamoate) .Marland Kitchen.. 1-2 By Mouth At Bedtime As Needed 13)  Calcium 600+d 600-400 Mg-Unit Tabs (Calcium Carbonate-Vitamin D) .... One By Mouth Two Times A Day  Allergies (verified): 1)  ! Iodine Tincture 2)  ! Aleve (Naproxen Sodium) 3)  ! Anectine (Succinylcholine Chloride) 4)  ! Vicodin (Hydrocodone-Acetaminophen) 5)  ! Oxycontin (Oxycodone Hcl) 6)  ! Ultram (Tramadol Hcl) 7)  ! Sudafed (Pseudoephedrine Hcl) 8)  ! * [place]  Past History:  Family History: Last updated: 01-20-2009 Father: Deceased, liver disease Mother:  Siblings: One sister- multiple problems, CAD, HTN, asthma Breast cancer  in patient t and Mat Aunt No FH of Colon Cancer:  Social History: Last updated: 10/31/2009 Married Former Smoker--quit 1979 Alcohol use-no Hasn't worked outside of home since marriage. Travels with husband Drug use-no  Risk Factors: Smoking Status: quit (07/31/2010)  Past medical, surgical, family and social histories (including risk factors) reviewed, and no  changes noted (except as noted below).  Past Medical History: Reviewed history from 10/31/2009 and no changes required. Spinal stenosis---------------------------------------------------Dr Kirstens Hyperlipidemia Breast cancer----------------------------------------------------Dr Vincente Poli Interstitial cystitis-------------------------------------------------Dr Logan Bores Anxiety Depression Diverticulosis, colon Hx. of adenomatous colon polyps Hypertension Hemorrhoids Impaired fasting glucose Osteoporosis Hyperparathyroidism  Past Surgical History: Reviewed history from 10/31/2009 and no changes required. Back surgery December 2007 in [place] Back surgery again in March of 2008 1981 right modified radical mastectomy Cholecystectomy 2004 Hysterectomy 1/11  Cervical spine procedure in [facility] s/p march 2011 lumbar kyphoplasty s/p cervical surgury for stenosis - dr Sheppard Penton  Family History: Reviewed history from 20-Jan-2009 and no changes required. Father: Deceased, liver disease Mother:  Siblings: One sister- multiple problems, CAD, HTN, asthma Breast cancer  in patient t and Mat Aunt No FH of Colon Cancer:  Social History: Reviewed history from 10/31/2009  and no changes required. Married Former Smoker--quit 1979 Alcohol use-no Hasn't worked outside of home since marriage. Travels with husband Drug use-no  Review of Systems  The patient denies anorexia, fever, weight loss, weight gain, vision loss, decreased hearing, hoarseness, chest pain, syncope, dyspnea on exertion, peripheral edema, prolonged cough, headaches, hemoptysis, abdominal pain, melena, hematochezia, severe indigestion/heartburn, hematuria, incontinence, genital sores, muscle weakness, suspicious skin lesions, transient blindness, difficulty walking, depression, unusual weight change, abnormal bleeding, enlarged lymph nodes, angioedema, and breast masses.    Physical Exam  General:  alert and overweight-appearing.    Head:  normocephalic and atraumatic.   Eyes:  vision grossly intact, pupils equal, and pupils round.   Ears:  R ear normal and L ear normal.   Nose:  no external deformity and no nasal discharge.   Mouth:  no gingival abnormalities and pharynx pink and moist.     Impression & Recommendations:  Problem # 1:  HYPERTENSION (ICD-401.9)  BP today: 140/84 Prior BP: 142/94 (01/22/2010)  10 Yr Risk Heart Disease: 17 %  Labs Reviewed: K+: 4.1 (08/27/2009) Creat: : 0.8 (08/27/2009)   Chol: 242 (08/27/2009)   HDL: 38.30 (08/27/2009)   LDL: 125 (12/15/2006)   TG: 255.0 (08/27/2009)  Problem # 2:  BACK PAIN (ICD-724.5) has a complicated patter of soma ( 250 in day and 350 at night as needed"_ percocet as needed discussion of cherry juice extract Her updated medication list for this problem includes:    Tylenol Extra Strength 500 Mg Tabs (Acetaminophen) .Marland Kitchen... As needed  every 5 to 6 hours    Soma 250 Mg Tabs (Carisoprodol) .Marland Kitchen... 1 three times a day as needed    Soma 350 Mg Tabs (Carisoprodol) .Marland Kitchen... 1 at bedtime as needed    Percocet 5-325 Mg Tabs (Oxycodone-acetaminophen) .Marland Kitchen... 1 daily as needed for severe pain  Discussed use of moist heat or ice, modified activities, medications, and stretching/strengthening exercises. Back care instructions given. To be seen in 2 weeks if no improvement; sooner if worsening of symptoms.   Problem # 3:  ANXIETY (ICD-300.00) she has been given the valium by plovsky ( psychiatry) The following medications were removed from the medication list:    Vistaril 25 Mg Caps (Hydroxyzine pamoate) ..... One by mouth daily Her updated medication list for this problem includes:    Valium 5 Mg Tabs (Diazepam) .Marland Kitchen... 1 tab three times a day as needed for nerves or muscle spasm. brand name only    Vistaril 25 Mg Caps (Hydroxyzine pamoate) .Marland Kitchen... 1-2 by mouth at bedtime as needed  Discussed medication use and relaxation techniques.   Problem # 4:  STENOSIS, SPINAL, UNSPC  REGION (ICD-724.00) chronic  back pain  Problem # 5:  POLYCYTHEMIA (ICD-289.0) hx of polycythemia,monitering Orders: TLB-CBC Platelet - w/Differential (85025-CBCD)  Complete Medication List: 1)  Tylenol Extra Strength 500 Mg Tabs (Acetaminophen) .... As needed  every 5 to 6 hours 2)  Soma 250 Mg Tabs (Carisoprodol) .Marland Kitchen.. 1 three times a day as needed 3)  Soma 350 Mg Tabs (Carisoprodol) .Marland Kitchen.. 1 at bedtime as needed 4)  Sombra Cool Therapy 6 % Gel (Menthol (topical analgesic)) .... As needed 5)  Valium 5 Mg Tabs (Diazepam) .Marland Kitchen.. 1 tab three times a day as needed for nerves or muscle spasm. brand name only 6)  Systane 0.4-0.3 % Soln (Polyethyl glycol-propyl glycol) .Marland Kitchen.. 1-2 drops as needed 7)  Percocet 5-325 Mg Tabs (Oxycodone-acetaminophen) .Marland Kitchen.. 1 daily as needed for severe pain 8)  Anusol-hc 25 Mg Supp (  Hydrocortisone acetate) .... Use 1 suppository before bedtime x 10 days. 9)  Prometrium 100 Mg Caps (Progesterone micronized) .... Take 1 by mouth at bedtime 10)  Red Yeast Rice 600 Mg Caps (Red yeast rice extract) .... Take 1 by mouth once daily 11)  Vitamin E Crea (Vitamin e) .... As needed 12)  Vistaril 25 Mg Caps (Hydroxyzine pamoate) .Marland Kitchen.. 1-2 by mouth at bedtime as needed 13)  Calcium 600+d 600-400 Mg-unit Tabs (Calcium carbonate-vitamin d) .... One by mouth two times a day  Other Orders: T-Vitamin D (25-Hydroxy) [phone]) T-Parathyroid Hormone, Intact w/ Calcium (09811-91478) TLB-A1C / Hgb A1C (Glycohemoglobin) (83036-A1C) TLB-Cholesterol, HDL (83718-HDL) TLB-Cholesterol, Direct LDL (83721-DIRLDL) TLB-Cholesterol, Total (82465-CHO)  Hypertension Assessment/Plan:      The patient's hypertensive risk group is category B: At least one risk factor (excluding diabetes) with no target organ damage.  Her calculated 10 year risk of coronary heart disease is 17 %.  Today's blood pressure is 140/84.  Her blood pressure goal is < 140/90.  Patient Instructions: 1)  avoid vit c due the  IC 2)  consider cherry juice extract capsules from deep roots for arthritis and pain 3)  Please schedule a follow-up appointment in 3 months.   Orders Added: 1)  Est. Patient Level IV [29562] 2)  T-Vitamin D (25-Hydroxy) [13086-57846] 3)  T-Parathyroid Hormone, Intact w/ Calcium [96295-28413] 4)  TLB-CBC Platelet - w/Differential [85025-CBCD] 5)  TLB-A1C / Hgb A1C (Glycohemoglobin) [83036-A1C] 6)  TLB-Cholesterol, HDL [83718-HDL] 7)  TLB-Cholesterol, Direct LDL [83721-DIRLDL] 8)  TLB-Cholesterol, Total [82465-CHO]

## 2010-10-21 ENCOUNTER — Encounter (HOSPITAL_COMMUNITY): Payer: Medicare Other | Attending: Oncology

## 2010-10-21 ENCOUNTER — Ambulatory Visit (HOSPITAL_COMMUNITY): Payer: Self-pay | Admitting: Oncology

## 2010-10-21 DIAGNOSIS — C50919 Malignant neoplasm of unspecified site of unspecified female breast: Secondary | ICD-10-CM

## 2010-10-21 DIAGNOSIS — Z853 Personal history of malignant neoplasm of breast: Secondary | ICD-10-CM | POA: Insufficient documentation

## 2010-10-21 DIAGNOSIS — Z79899 Other long term (current) drug therapy: Secondary | ICD-10-CM | POA: Insufficient documentation

## 2010-10-21 DIAGNOSIS — M810 Age-related osteoporosis without current pathological fracture: Secondary | ICD-10-CM

## 2010-10-28 ENCOUNTER — Telehealth: Payer: Self-pay | Admitting: Internal Medicine

## 2010-10-28 NOTE — Telephone Encounter (Signed)
Pt called to adv that she needs a Rx for med: Percocet.... Pt adv she has been taking it for years for chronic pain... Pt adv that she would like to speak with Dr Lovell Sheehan or nurse to explain why she needs this med and why it can't be a generic med..... Pt can be reached at [phone].... [facility].

## 2010-11-11 LAB — BASIC METABOLIC PANEL
BUN: 12 mg/dL (ref 6–23)
Calcium: 10.9 mg/dL — ABNORMAL HIGH (ref 8.4–10.5)
GFR calc non Af Amer: >60 mL/min (ref >60)
Glucose, Bld: 119 mg/dL — ABNORMAL HIGH (ref 70–99)
Potassium: 4.2 mEq/L (ref 3.5–5.1)

## 2010-11-11 LAB — DIFFERENTIAL
Basophils Absolute: 0.1 10*3/uL (ref 0.0–0.1)
Basophils Relative: 1 % (ref 0–1)
Eosinophils Relative: 3 % (ref 0–5)
Monocytes Absolute: 0.4 10*3/uL (ref 0.1–1.0)
Neutro Abs: 4.2 10*3/uL (ref 1.7–7.7)

## 2010-11-11 LAB — CBC
HCT: 45.9 % (ref 36.0–46.0)
HCT: 46.2 % — ABNORMAL HIGH (ref 36.0–46.0)
Hemoglobin: 15.6 g/dL — ABNORMAL HIGH (ref 12.0–15.0)
Platelets: 211 10*3/uL (ref 150–400)
Platelets: 202 10*3/uL (ref 150–400)
RBC: 5.17 MIL/uL — ABNORMAL HIGH (ref 3.87–5.11)
RDW: 12.9 % (ref 11.5–15.5)
WBC: 8 10*3/uL (ref 4.0–10.5)

## 2010-11-11 LAB — COMPREHENSIVE METABOLIC PANEL
Albumin: 4.2 g/dL (ref 3.5–5.2)
Alkaline Phosphatase: 86 U/L (ref 39–117)
BUN: 14 mg/dL (ref 6–23)
Chloride: 104 mEq/L (ref 96–112)
Potassium: 4 mEq/L (ref 3.5–5.1)
Total Bilirubin: 0.7 mg/dL (ref 0.3–1.2)

## 2010-11-11 LAB — CANCER ANTIGEN 27.29: CA 27.29: 22 U/mL (ref 0–39)

## 2010-11-11 LAB — APTT: aPTT: 29 seconds (ref 24–37)

## 2010-11-15 LAB — CBC
MCHC: 34.2 g/dL (ref 30.0–36.0)
MCV: 90.5 fL (ref 78.0–100.0)
Platelets: 192 10*3/uL (ref 150–400)
RDW: 12.9 % (ref 11.5–15.5)
WBC: 6.1 10*3/uL (ref 4.0–10.5)

## 2010-11-15 LAB — PROTIME-INR: Prothrombin Time: 13.1 seconds (ref 11.6–15.2)

## 2010-11-15 LAB — BASIC METABOLIC PANEL
BUN: 9 mg/dL (ref 6–23)
Chloride: 105 mEq/L (ref 96–112)
Creatinine, Ser: 0.7 mg/dL (ref 0.4–1.2)
Glucose, Bld: 131 mg/dL — ABNORMAL HIGH (ref 70–99)

## 2010-11-26 LAB — COMPREHENSIVE METABOLIC PANEL
AST: 21 U/L (ref 0–37)
Albumin: 4.4 g/dL (ref 3.5–5.2)
BUN: 7 mg/dL (ref 6–23)
Calcium: 10.7 mg/dL — ABNORMAL HIGH (ref 8.4–10.5)
Creatinine, Ser: 0.86 mg/dL (ref 0.4–1.2)
GFR calc Af Amer: >60 mL/min (ref >60)

## 2010-11-26 LAB — CBC
MCHC: 34.4 g/dL (ref 30.0–36.0)
MCV: 88.3 fL (ref 78.0–100.0)
Platelets: 198 10*3/uL (ref 150–400)
RDW: 12.9 % (ref 11.5–15.5)
WBC: 7.3 10*3/uL (ref 4.0–10.5)

## 2010-12-11 ENCOUNTER — Encounter (HOSPITAL_COMMUNITY): Payer: Medicare Other | Attending: Oncology

## 2010-12-11 DIAGNOSIS — M810 Age-related osteoporosis without current pathological fracture: Secondary | ICD-10-CM

## 2010-12-11 DIAGNOSIS — C50919 Malignant neoplasm of unspecified site of unspecified female breast: Secondary | ICD-10-CM

## 2011-01-05 NOTE — Procedures (Signed)
NAMEDONI, BACHA NO.:  [id number]   MEDICAL RECORD NO.:  [id number]          PATIENT TYPE:  REC   LOCATION:  [facility]                          FACILITY:  [facility]   PHYSICIAN:  Erick Colace, M.D.DATE OF BIRTH:  [birth year]   DATE OF PROCEDURE:  11/23/2007  DATE OF DISCHARGE:                               OPERATIVE REPORT   Ms. Rolin returns today for acupuncture treatment.  She has 8-9/10  pain not only in her low back, bilateral lower extremities with numbness  and pain, as well as her interstitial cystitis pain in the pelvic region  and bladder.   Needles placed at CV2, CV4, right auricular thalamus point, left  auricular shen-men, bilateral TH8, bilateral GB34, bilateral LV3, as  well as DU20, DU24.5, E-Stim 4 Hz x30 minutes.  The patient tolerated  the procedure well.  Will also trial 10 TENS unit through [facility].  I will  see her back in 10-14 days.      Erick Colace, M.D.  Electronically Signed     AEK/MEDQ  D:  11/23/2007 12:49:25  T:  11/23/2007 13:12:47  Job:  [id number]

## 2011-01-05 NOTE — Procedures (Signed)
[name], KOCI NO.:  [id number]   MEDICAL RECORD NO.:  [id number]          PATIENT TYPE:  REC   LOCATION:  [facility]                          FACILITY:  [facility]   PHYSICIAN:  Erick Colace, M.D.DATE OF BIRTH:  [birth year]   DATE OF PROCEDURE:  DATE OF DISCHARGE:                               OPERATIVE REPORT   HISTORY OF PRESENT ILLNESS:  Ms. Shafran returns today.  Her last visit  was in February 02, 2008.  She has had some carpal tunnel type of pain in  the left hand.  She has had carpal tunnel syndrome diagnosed a couple  years ago, left greater than right hand, and she has been symptomatic in  the left hand despite using a splint.  Her pain is averaging in the 8/10  range.  Needles placed at bilateral TH8, GB34, LR3 with E-stim between  LR3 and GB34 bilaterally at 4 hertz.  Also, needles placed at DU24.5,  DU20 with E-stim 4 hertz x30 minutes and in between EV2 and CV4 with E-  stim 4 hertz x30 minutes.  Additionally, left MH6 and MH 7 on the left,  as well as TH4 and TH5 on the left with E-stim 4 hertz x30 minutes.   The patient tolerated the procedure well.   PLAN:  We will see her back in 10 days for acupuncture but also check  EMG/NCV as it seems like her carpal tunnel symptoms are only temporarily  relieved with the acupuncture.      Erick Colace, M.D.  Electronically Signed     AEK/MEDQ  D:  02/13/2008 15:03:14  T:  02/15/2008 03:06:53  Job:  [id number]

## 2011-01-05 NOTE — Procedures (Signed)
[name], [name] NO.:  [id number]   MEDICAL RECORD NO.:  [id number]          PATIENT TYPE:  REC   LOCATION:  [facility]                          FACILITY:  [facility]   PHYSICIAN:  Erick Colace, M.D.DATE OF BIRTH:  1929-09-01   DATE OF PROCEDURE:  05/06/2008  DATE OF DISCHARGE:                               OPERATIVE REPORT   [name] returns today.  She was last seen by me on April 23, 2008.  Overall doing better with acupuncture treatment for flare up of  thoracic as well as lumbar spine pain.  Oswestry Disability Index today  was 60%.   Needle was placed on the right at BL 17, BL 19, BL 21, BL 23, BL 25, BL  27 as well as right GB 30, right GB 31, and right GB 34 as well as CV2,  CV4, DU 20, DU 24.5.  Baldry technique, GB21, right auricular thalamus  point and right auricular [place].   E-Stim 4 hertz, 30 minutes.  The patient tolerated the procedure well.  Pre-post procedure education given.      Erick Colace, M.D.  Electronically Signed     AEK/MEDQ  D:  05/06/2008 12:42:53  T:  05/07/2008 06:11:43  Job:  [id number]

## 2011-01-05 NOTE — Assessment & Plan Note (Signed)
[facility]                         GASTROENTEROLOGY OFFICE NOTE   NAME:[name] [name] NAND                 MRN:          [id number]  DATE:10/16/2007                            DOB:          1930/08/02    CHIEF COMPLAINT:  Intestines I think, stool dark green, abdomen sore.   HISTORY:  This is a 77 year old white woman with chronic complaints as  above.  She has noted some dark green stools and that concerned her but  there is no melena or rectal bleeding.  She has chronic intermittent  bloating and loose stools ever since her colonoscopy.  That is not  really new.  Her entire abdomen is sore at times, sometimes even into  the back, often on the left side.  She has not tried any particular  medications and indicates she really does not particularly care to take  medications unless she has to.  She does have a diagnosis of irritable  bowel syndrome.  She has interstitial cystitis as well and can have  urgent urination or defecation and keeps a bed pan with her when she  travels because of this.   MEDICATIONS:  Her medications are provided in a list from the patient  and are:  1. Prilosec intermittently.  2. Morphine as needed, rarely taken.  3. Percocet as needed.  4. Darvocet as needed.  5. Tylenol as needed.  6. SOMBRA as needed.  7. Valium as needed.  8. Soma as needed.   Regarding her medications, she responds to brand-name only.  She has had  difficulty with generic Estrace and Valium.  She is taking Estrace as  well.  In general, when she is prescribed a new drug, she says she tries  it the first time and gets approximately 6 to determine the  effectiveness and the side effects prior to filling the entire  prescription.   ALLERGIES:  IODINE, SUDAFED (TACHYCARDIA FROM THE SUDAFED).   PAST MEDICAL AND SURGICAL HISTORY:  1. Hypertension.  2. Obesity.  3. Asthma.  4. Dyslipidemia.  5. Osteoarthritis.  6. Anxiety.  7. Interstitial  cystitis.  8. Allergic rhinosinusitis.  9. Depression.  10.Cholecystectomy for gallstones.  11.Hysterectomy.  12.Breast surgery for breast cancer.  13.She has had traumatic injuries which have led to some chronic pain      problems.  14.L2 fracture.  15.Partial resection of the cecum with Meckel's diverticulectomy and      revision of adhesions.  16.Very tortuous colon with adenomatous polyp May 11, 2005 (Dr.      Victorino Dike), fair prep.      a.     Diverticulosis and internal and external hemorrhoids on that       exam as well.  17.Lactose intolerance.   FAMILY HISTORY:  Heart disease in a sister.  Colon polyps in her father.  Liver disease in her father.   SOCIAL HISTORY:  She is married.  She has been in the real estate  business.  No alcohol,  no tobacco and no drugs.   REVIEW OF SYSTEMS:  See my medical history form for full details.  She  has multiple  aches and pains.  She is currently working with integrative  therapy on her pain problems as well to try to help with physical  therapy and aquatic exercise, etc.   PHYSICAL EXAMINATION:  GENERAL:  Physical examination reveals an obese,  elderly white woman.  Height 5 feet, 5 inches, weight 185 pounds.  VITAL SIGNS:  Blood pressure 160/80. Pulse 100.  HEENT:  The eyes are anicteric. ENT:  Normal nose and lips.  NECK:  Supple, no thyromegaly or mass.  CHEST:  Clear.  HEART:  S1, S2, no murmurs, rubs or gallops.  ABDOMEN:  She has some tenderness in the left lower quadrant which is  worse with muscle tension.  The remainder of the abdomen is obese, soft  and benign.  Bowel sounds are present.  There is no organomegaly.  LYMPHATICS:  No neck adenopathy.  EXTREMITIES:  No edema.  SKIN:  No rash.  NEUROLOGICAL:  Cranial nerves II-XII intact.  PSYCHIATRIC:  She is alert and oriented x3.   I have reviewed EMR records from Dr. Karle Starch office as well as old  records on our chart here at the office.   ASSESSMENT:  1.  Her left lower quadrant pain appears to be musculoskeletal.  2. She certainly sounds like she has underlying irritable bowel      syndrome.  3. She also has interstitial cystitis.  4. Personal history of adenomatous colon polyps.  5. Personal history of Meckel's diverticulectomy and partial resection      of the cecum and adhesiolysis years ago.  6. Difficult colonoscopy due to tortuosity.   PLAN:  1. She is reassured today.  She should continue with her integrative      therapies.  I have no specific therapy to offer at this time.  2. We will check stool Hemoccult's.  3. Otherwise, we will plan on routine colonoscopy later this year.  4. It is noted that she does not like to take medications unless      absolutely necessary.  I would more than likely give her a movie      prep for her next colonoscopy to try to make sure we have a very      good prep.     Iva Boop, MD,FACG  Electronically Signed    CEG/MedQ  DD: 10/16/2007  DT: 10/17/2007  Job #: [id number]   cc:   Karie Schwalbe, MD

## 2011-01-05 NOTE — Assessment & Plan Note (Signed)
Ms. Vetter returns today.  She is following up from an exacerbation of  back pain following a fall.  She continues to use the walker because it  is painful.  She has right-sided rib pain.  She has back pain running  from her thoracic area down to her lumbar area.  She rates her pain is a  9-1/2 out of 10, sharp, stabbing, constant, and aching.  She has some  left over MS Contin that she received earlier this year after a  compression fracture and took that at night.  Pain does improve with  rest, heat, exercise, and medications; worse with walking, bending,  sitting, activity, and standing.   I reviewed her x-rays today.  She did have lumbar spine 4 views, April 01, 2008 with comparison to prior MRI, February 04, 2007.  She had L2  vertebroplasty changes, which were seen prior.  No other acute findings.  She has also had bilateral ribs and chest films.  She had no evidence of  rib fracture or no evidence of pneumothorax.  There were no acute  findings.   PHYSICAL EXAMINATION:  GENERAL:  No acute distress.  Mood and affect  appropriate.  EXTREMITIES:  She can ambulate short distance without a walker.  She has  no evidence of toe drag or knee instability.  Her back has tenderness to  palpation in the thoracic, lumbar, and lumbosacral paraspinals.  In  addition, she has tenderness to palpation in the left gluteus maximus  and over the left ischial bursa.   IMPRESSION:  Fall resulting in multiple contusions, but no evidence of  fracture in the ribs or back.  I discussed with her prognosis, I think,  she will slowly improve, but given her age, this may take 6-8 weeks.   I also have written some Voltaren gel that she can try over the painful  rib areas as she states her Lidoderm is not sticking.  She states her  Lidoderm is over a year old.  She is wondering whether the adhesive has  worn out.  I have given her a couple of samples of Lidoderm patch to see  if a newer patch sticks  better.   I will see her back in about 10 days for repeat acupuncture treatment.   I discussed the treatment plan.  She understands.      Erick Colace, M.D.  Electronically Signed     AEK/MedQ  D:  04/11/2008 12:16:08  T:  04/12/2008 00:49:42  Job #:  [id number]

## 2011-01-05 NOTE — Assessment & Plan Note (Signed)
Followup of chronic back pain.  Has had lumbar compression fracture,  superior end plate, L2.  She has had kyphoplasty, but no relief, is  following up with another neurosurgeon.  She was switched from oxycodone  to morphine sulfate 15 mg b.i.d.  She started out with b.i.d., but then  switched over to q.h.s. dosing.  She had 60 prescribed approximately one  month ago and she has 24 left yet.  She does not think she needs another  prescription at this time.   GENERAL:  No acute distress.  MOOD AND AFFECT:  Appropriate.  GAIT:  Normal.  TRANSFERS:  Independent.  She is no longer using any type of assistive device.  Blood pressure 156/62, pulse 92, respiratory rate is 18, O2 sat 95% on  room air.   IMPRESSION:  Lumbar compression fracture with no significant relief from  vertebroplasty.  Will continue MS-Contin, mainly for helping her sleep  at night, 15 mg sustained release q.h.s.  She is tapering her use of  this on her own and we will not issue a new prescription today.  I will  see her back and re-evaluate her in approximately one month.      Erick Colace, M.D.  Electronically Signed     AEK/MedQ  D:  04/04/2007 12:03:56  T:  04/05/2007 13:15:28  Job #:  [id number]   cc:   Tia Alert, MD  Fax: [phone]

## 2011-01-05 NOTE — Procedures (Signed)
NAMEJAMECA, [name]          ACCOUNT NO.:  [id number]   MEDICAL RECORD NO.:  [id number]          PATIENT TYPE:  REC   LOCATION:  TPC                          FACILITY:  [facility]   PHYSICIAN:  Erick Colace, M.D.DATE OF BIRTH:  09-18-1929   DATE OF PROCEDURE:  DATE OF DISCHARGE:                               OPERATIVE REPORT   ACUPUNCTURE TREATMENT:   INDICATION:  Thoracic spondylosis, interstitial cystitis, rib strain,  chronic pain post trauma.   Needles placed at right BL21, BL27, left BL23, BL25 in addition to BL21,  BL27, left BL49, BL50, BL51, BL52 as well as SP21, GB25, LR13.  CV2,  CV4, DU20, and DU24.5, left thalamus, and left Shen Men auricular point.  A 4-Hz E-stim x30 minutes with the exception of the auricular point.  We  will see her back in 2 weeks.  Oswestry disability index today is 60%,  which is stable.      Erick Colace, M.D.  Electronically Signed     AEK/MEDQ  D:  05/30/2008 16:28:46  T:  05/31/2008 02:35:01  Job:  [id number]

## 2011-01-05 NOTE — Procedures (Signed)
NAMEJOLETTA, MANNER NO.:  [id number]   MEDICAL RECORD NO.:  [id number]          PATIENT TYPE:  REC   LOCATION:  [facility]                          FACILITY:  [facility]   PHYSICIAN:  Erick Colace, M.D.DATE OF BIRTH:  Mar 12, 1930   DATE OF PROCEDURE:  11/27/2007  DATE OF DISCHARGE:                               OPERATIVE REPORT   PROCEDURE:  Right sacroiliac injection under fluoroscopic guidance.   INDICATION:  Right sacroiliac disorder and has right buttock pain only  partially responsive to medication management and other conservative  care including physical therapy and acupuncture.   Informed consent was obtained after describing risks and benefits of the  procedure to the patient.  These include bleeding, bruising, infection.  She elects to proceed and has given written consent.  The patient was  placed prone on fluoroscopy table.  Betadine prep, sterile drape, 25-  gauge inch and a half needle was used to anesthetize skin and subcu  tissue, 1% lidocaine x2 mL.  Then a 25-gauge, 3 inches spinal needle was  inserted under fluoroscopic guidance in the right SI joint.  The AP,  lateral and oblique imaging utilized.  Omnipaque 180 x 0.5 mL  demonstrated good joint outline, followed by injection of 0.5 mL of 40  mg/mL of Depo-Medrol and 1 mL of 2% MPF lidocaine.  The patient  tolerated procedure well.  Pre and post injection vitals stable.  Pre-  injection pain level 7 out of 10, post injection zero.      Erick Colace, M.D.  Electronically Signed     AEK/MEDQ  D:  11/27/2007 13:49:04  T:  11/27/2007 15:44:43  Job:  [id number]

## 2011-01-05 NOTE — Op Note (Signed)
[name], [name]          ACCOUNT NO.:  [id number]   MEDICAL RECORD NO.:  [id number]          PATIENT TYPE:  OIB   LOCATION:  3008                         FACILITY:  [facility]   PHYSICIAN:  Danae Orleans. Venetia Maxon, M.D.  DATE OF BIRTH:  Jan 31, 1930   DATE OF PROCEDURE:  02/21/2007  DATE OF DISCHARGE:                               OPERATIVE REPORT   PREOPERATIVE DIAGNOSIS:  L2 compression fracture.   POSTOPERATIVE DIAGNOSIS:  L2 compression fracture.   PROCEDURE:  L2 kyphoplasty, with fluoroscopic control.   SURGEON:  Danae Orleans. Venetia Maxon, M.D.   ANESTHETIC:  Monitored sedation.   COMPLICATIONS:  None.   ESTIMATED BLOOD LOSS:  Minimal,   DISPOSITION:  Recovery.   INDICATIONS:  [name] is a 77 year old woman with a painful L2  compression fracture.  It was elected to take her to surgery for  kyphoplasty.   PROCEDURE:  [name] was brought to the operating room.  She was  placed in a prone position on chest and pelvic rolls.  Her midback was  then prepped and draped in the usual sterile fashion after AP and  lateral fluoroscopy was positioned.  The L2 fracture was identified.  Subsequently, skin and subcutaneous tissues were infiltrated with 0.25%  percent Marcaine and 0.5% lidocaine with 1:200,000 epinephrine.  Initially, an attempt was made to place an introducer on the left to do  unipedicular entry.  This was done according to standard landmarks, and  introducer was inserted into the vertebral body balloon.  The drill was  used, and then subsequently the balloon was filled.  There was  deformation of the previously fractured superior endplate of L2, which  limited the amount that the balloon could be inflated, and consequently  it was elected to perform additional entry from the right L2 pedicle.  This was done.  The filling pressures were much higher on the right side  of the vertebra, up to 350 PSI.  On the left, they were up to 150.  There was minimal  displacement of bone with filling on the right side.  However, with the bone cement placed on the right side, there was good  interdigitation within the vertebra, and this filling appear to cross  the midline and fill the right side of the vertebra.  On the left side,  the filling was limited because there was a small amount of  extravasation of bone cement into the disc space, but some filling was  performed on the left.  The  introducers were then removed, with no tail of bone cement, and  subsequently 3-0 Vicryl interrupted sutures were placed, and Dermabond  was used to dress the skin.  The patient was taken to recovery in stable  satisfactory condition, having tolerated the procedure well.      Danae Orleans. Venetia Maxon, M.D.  Electronically Signed     JDS/MEDQ  D:  02/21/2007  T:  02/22/2007  Job:  [id number]

## 2011-01-05 NOTE — Procedures (Signed)
NAMELOREN, VICENS          ACCOUNT NO.:  [id number]   MEDICAL RECORD NO.:  [id number]          PATIENT TYPE:  REC   LOCATION:  [facility]                          FACILITY:  [facility]   PHYSICIAN:  Erick Colace, M.D.DATE OF BIRTH:  05-18-1930   DATE OF PROCEDURE:  05/16/2007  DATE OF DISCHARGE:                               OPERATIVE REPORT   Acupuncture treatment.  Points placed at CV2, CV4, DU20, DU24.5.  The  stim at 4 Hz x 30 minutes between these points.  She had bilateral TH8  and bilateral GB34 with the stim 4 Hz x 30 minutes.  She also had  bilateral L3, LR3, placed as well as ba-feng needles in the webspace of  the toes.  Left auricular elements and shen-men points were used as  well.  The patient tolerated the procedure well.  Post procedure  instructions given.      Erick Colace, M.D.  Electronically Signed     AEK/MEDQ  D:  05/16/2007 16:12:04  T:  05/17/2007 10:28:35  Job:  [id number]

## 2011-01-05 NOTE — Procedures (Signed)
NAMEKONSTANCE, HAPPEL NO.:  [id number]   MEDICAL RECORD NO.:  [id number]          PATIENT TYPE:  OIB   LOCATION:  3008                         FACILITY:  [facility]   PHYSICIAN:  Erick Colace, M.D.DATE OF BIRTH:  [birth year]   DATE OF PROCEDURE:  DATE OF DISCHARGE:  02/22/2007                               OPERATIVE REPORT   INTERVAL HISTORY:  Followed up after vertebroplasty.  Underwent L2  kyphoplasty by Dr. Venetia Maxon February 21, 2007.  States she has had increased  pain since kyphoplasty.  Pain level is currently 10/10.  Sleep is poor.  Treatment today consisted points placed bilateral BL21, BL23, BL25,  BL27, GB30, DU20, DU24.5, CV2, CV4 as well as auricular point, thalamus  and shen-men.  Electrical stimulation between all points except  auricular 30 minutes 4 Hz, the head and abdomen and 15 Hz for back.  The  patient tolerated the procedure well.  See her back next week.  In  addition she is having problems sleeping at night on oxycodone.  Will  initiate MS Contin 15 twice daily.      Erick Colace, M.D.  Electronically Signed     AEK/MEDQ  D:  03/06/2007 16:53:58  T:  03/07/2007 11:10:13  Job:  [id number]

## 2011-01-05 NOTE — Procedures (Signed)
[name], [name] NO.:  [id number]   MEDICAL RECORD NO.:  [id number]          PATIENT TYPE:  REC   LOCATION:  TPC                          FACILITY:  [facility]   PHYSICIAN:  Erick Colace, M.D.DATE OF BIRTH:  1930/04/05   DATE OF PROCEDURE:  10/26/2007  DATE OF DISCHARGE:                               OPERATIVE REPORT   INDICATIONS FOR PROCEDURE:  Pain related to interstitial cystitis,  thoracic spondylosis, paresthesias, and dysesthesias.   PROCEDURE:  Needles were placed at CV-2, CV-4, bilateral TH-8, bilateral  GB-34, bilateral LV-3, bilateral Ba Feng needle was placed in the dorsal  web spaces between the toes as well as DU-20, DU-24.5 E stem 4 Hz for 30  minutes.  The patient tolerated the procedure well and will return in 10  days.      Erick Colace, M.D.  Electronically Signed     AEK/MEDQ  D:  10/26/2007 17:28:15  T:  10/26/2007 22:52:10  Job:  [id number]

## 2011-01-05 NOTE — Procedures (Signed)
[name], RENO NO.:  [id number]   MEDICAL RECORD NO.:  [id number]           PATIENT TYPE:   LOCATION:                                 FACILITY:   PHYSICIAN:  Erick Colace, M.D.DATE OF BIRTH:  1930-04-25   DATE OF PROCEDURE:  DATE OF DISCHARGE:                               OPERATIVE REPORT   Acupuncture treatment for interstitial cystitis, paresthesia and  thoracic spondylosis with myelopathy and peripheral neuropathy.   The needle was placed at CV2, CV4, bilateral TH8, bilateral GV34,  bilateral LR3.  Bafeng needle was placed in bilateral feet dorsal web  spaces and then right Shen Men auricular point.  In addition, DU 24.5  and DU 20 utilized at 4 Hz stimulation x3 minutes.  Return in 10 days.      Erick Colace, M.D.  Electronically Signed     AEK/MEDQ  D:  09/07/2007 11:28:24  T:  09/07/2007 12:18:06  Job:  [id number]

## 2011-01-05 NOTE — Procedures (Signed)
NAMEHOANG, [name]          ACCOUNT NO.:  [id number]   MEDICAL RECORD NO.:  [id number]          PATIENT TYPE:  REC   LOCATION:  T[facility]                          FACILITY:  [facility]   PHYSICIAN:  Erick Colace, M.D.DATE OF BIRTH:  1930-08-10   DATE OF PROCEDURE:  DATE OF DISCHARGE:                               OPERATIVE REPORT   Acupuncture treatment.  Last treatment done approximately 10 days ago.   Needles placed at DU20, DU24.5, CB2, CB4, TH8, CB34, LR3, bah feng  needle in bilateral feet in the web spaces.  Left thalamus and right  shen men.  In addition, did left ST2, ST3, ST4.   E stim 4 Hz 30 minutes.  The patient tolerated the procedure well.      Erick Colace, M.D.  Electronically Signed     AEK/MEDQ  D:  06/26/2007 16:12:04  T:  06/27/2007 13:03:04  Job:  [id number]

## 2011-01-05 NOTE — Procedures (Signed)
NAMETRINIA, GEORGI NO.:  [id number]   MEDICAL RECORD NO.:  [id number]          PATIENT TYPE:  REC   LOCATION:  T[facility]                          FACILITY:  [facility]   PHYSICIAN:  Erick Colace, M.D.DATE OF BIRTH:  Jan 08, 1930   DATE OF PROCEDURE:  09/07/2007  DATE OF DISCHARGE:                               OPERATIVE REPORT   Acupuncture treatment for interstitial cystitis, thoracic spondylosis  without myelopathy as well as paresthesias, dysesthesias, lower  extremity.  Pain is only partially response to medication management.  She is sensitive to medications.   Needles placed at CB2, CB4, bilateral TH8, bilateral CB34, bilateral  LR3, bah feng needle was placed at bilateral dorsal web spaces as well  as left shen man in left thalamus auricular point in addition, DU24.5,  DU20 were utilized 4 Hz stimulation x30 minutes.  The patient tolerated  procedure well, postprocedure instructions given.      Erick Colace, M.D.  Electronically Signed     AEK/MEDQ  D:  09/21/2007 17:11:21  T:  09/22/2007 08:21:33  Job:  [id number]

## 2011-01-05 NOTE — Procedures (Signed)
NAMELATIFAH, PADIN NO.:  [id number]   MEDICAL RECORD NO.:  [id number]          PATIENT TYPE:  REC   LOCATION:  TPC                          FACILITY:  [facility]   PHYSICIAN:  Erick Colace, M.D.DATE OF BIRTH:  Apr 30, 1930   DATE OF PROCEDURE:  02/02/2008  DATE OF DISCHARGE:                               OPERATIVE REPORT   Ms. Abaya returns today.  She states she slept through her last  scheduled visit on Jan 09, 2008.  Her last acupuncture treatment on Dec 28, 2007.  She has had lower extremity pain, has new pain, states she has  carpal tunnel previously diagnosed with EMG a couple of years ago, left  greater than right hand.  She has been sleeping with splint but this  pain has been persistent despite this.  Pain is averaging 7/10 range.   Needles were placed at bilateral TH-8, GB-34, LR-3 with E-stim between  LR-3 and GB-34 bilaterally 4 Hz.  Also, needles placed at the DU-24.5,  DU-20 with E-stim 4 Hz x30 minutes.  In addition, between CV-2 and CV-4  E-stim 4 Hz x30 minutes.  In addition, acupuncture needles were placed  at MH-6, MH-7 on the left as well as TH-4, TH-5 on the left with E-stim  4 Hz x30 minutes between these points in a cross pattern.  The patient  tolerated the procedure well.  Since she had further symptomatology,  unrelieved by splinting of the left wrist, we will schedule for EMG and  NCV.      Erick Colace, M.D.  Electronically Signed     AEK/MEDQ  D:  02/02/2008 16:39:55  T:  02/03/2008 21:27:07  Job:  [id number]

## 2011-01-05 NOTE — Procedures (Signed)
[name], [name] NO.:  [id number]   MEDICAL RECORD NO.:  [id number]           PATIENT TYPE:   LOCATION:                                 FACILITY:   PHYSICIAN:  Erick Colace, M.D.DATE OF BIRTH:  [birth year]   DATE OF PROCEDURE:  06/15/2007  DATE OF DISCHARGE:                               OPERATIVE REPORT   [name] returns for acupuncture treatment.   Needles placed today at DU20, DU24.5, CV2, CV4, bilateral TH8, bilateral  GB34, Ba feng needles in the web spaces of the toes, right auricular  thalamus point, right shen men point.  E-Stim 4 hertz times 30 minutes.  The patient tolerated the procedure well.      Erick Colace, M.D.  Electronically Signed     AEK/MEDQ  D:  06/15/2007 17:03:15  T:  06/16/2007 14:49:40  Job:  [id number]
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
## 2011-01-05 NOTE — Procedures (Signed)
NAMECARIANNE, TAIRA NO.:  [id number]   MEDICAL RECORD NO.:  [id number]           PATIENT TYPE:   LOCATION:                                 FACILITY:   PHYSICIAN:  Erick Colace, M.D.DATE OF BIRTH:  09/25/1929   DATE OF PROCEDURE:  04/01/2008  DATE OF DISCHARGE:                               OPERATIVE REPORT   Ms. Bastedo returns.  She has had exacerbations of back pain following  a fall.  She says has mainly axial pain.  No lower extremity radiation.  No bowel or bladder symptoms.  She is walking with a walker now due to  pain.   Needles placed bilateral BL 21, BL 23, BL 25, and BL 27 as well as  bilateral BL52 and DU4.  In addition, needles placed at bilateral BL17.  In addition, CV2 and CV4 placed for her symptoms 30 minute.  DU20 and  DU24.5 for minutes.  Auricular needles placed left  Shen Men and left  thalamus.      Erick Colace, M.D.  Electronically Signed     AEK/MEDQ  D:  04/01/2008 18:24:11  T:  04/02/2008 09:54:34  Job:  [id number]

## 2011-01-05 NOTE — Procedures (Signed)
[name], [name]          ACCOUNT NO.:  [id number]   MEDICAL RECORD NO.:  [id number]          PATIENT TYPE:  REC   LOCATION:  TPC                          FACILITY:  [facility]   PHYSICIAN:  Erick Colace, M.D.DATE OF BIRTH:  14-Dec-1929   DATE OF PROCEDURE:  DATE OF DISCHARGE:                               OPERATIVE REPORT   Acupuncture today for interstitial cystitis, thoracic spondylosis  without myelopathy, paresthesias, and new complaints of right ankle  pain.  She thinks she may have twisted her ankle while doing some  gardening.  Her right ankle is without swelling.  It has range of  motion.  Average pain is 6-7/10.   Treatment today consists of points placed bilaterally at GB-34 and TH-8.  Also, points placed at SP-4, SP-6, and KI-3, on the left side only.  Also, points placed at GU-24.5 and GU-20 placed.  Auricular points,  right Shen Men, right thalamus as well as CV-2, CV-4.  E-Stim between  points 4 Hz x30 minutes with the exception of between KI-3 and SB-4 at  20 Hz.   Patient tolerated the procedure well.  Should ankle pain persists, she  will follow up with me for an office visit and will fully evaluate that.      Erick Colace, M.D.  Electronically Signed     AEK/MEDQ  D:  12/05/2007 16:59:00  T:  12/05/2007 17:57:54  Job:  [id number]

## 2011-01-05 NOTE — Procedures (Signed)
NAMEDAILIN, SOSNOWSKI NO.:  [id number]   MEDICAL RECORD NO.:  [id number]          PATIENT TYPE:  REC   LOCATION:  TPC                          FACILITY:  [facility]   PHYSICIAN:  Erick Colace, M.D.DATE OF BIRTH:  05-11-1930   DATE OF PROCEDURE:  11/14/2007  DATE OF DISCHARGE:                               OPERATIVE REPORT   Acupuncture treatment for pain related to interstitial cystitis,  thoracic spondylosis, paresthesias, dysesthesias.  Needles were placed  at CV2, CV4, bilateral TH8, bilateral GB34, bilateral LV3, bilateral  Basang needles placed at the dorsal web space between the toes as well  at DU20, DU24.5, left auricular thalamus point, left auricular.  The  patient tolerated the procedure well and return in approximately ten  days.      Erick Colace, M.D.  Electronically Signed     AEK/MEDQ  D:  11/14/2007 16:21:21  T:  11/14/2007 20:49:56  Job:  [id number]

## 2011-01-05 NOTE — Procedures (Signed)
NAMEERCEL, NORMOYLE NO.:  [id number]   MEDICAL RECORD NO.:  [id number]          PATIENT TYPE:  REC   LOCATION:  TPC                          FACILITY:  [facility]   PHYSICIAN:  Erick Colace, M.D.DATE OF BIRTH:  09-07-1929   DATE OF PROCEDURE:  06/02/2007  DATE OF DISCHARGE:                               OPERATIVE REPORT   Ms. Stonehouse returns for acupuncture treatment.  Needles were placed  today at DU-20, DU-24.5, CV-2, CV-4, bilateral TH, bilateral GB-34, Ba  Feng needles in the webspace of the toes, right auricular thalamus, and  right shen-men.  In addition, a right GB-25 and 26 were used.  Patient  tolerated the procedure well.  She will see me in 10 days for  acupuncture.  She wants to discuss in more detail her meds so we will  make an office visit in addition.      Erick Colace, M.D.  Electronically Signed     AEK/MEDQ  D:  06/02/2007 14:00:44  T:  06/02/2007 21:51:20  Job:  [id number]

## 2011-01-05 NOTE — Procedures (Signed)
NAMESHANDRELL, BODA NO.:  [id number]   MEDICAL RECORD NO.:  [id number]           PATIENT TYPE:   LOCATION:                                 FACILITY:   PHYSICIAN:  Erick Colace, M.D.DATE OF BIRTH:  1930-04-23   DATE OF PROCEDURE:  DATE OF DISCHARGE:                               OPERATIVE REPORT   PROCEDURE:  Acupuncture treatment for interstitial cystitis, thoracic  spondylosis without myelopathy, as well as anesthesias and dysesthesias  of the lower extremity.   INDICATIONS FOR PROCEDURE:  She had only partial response to medication  management.   OPERATOR:  Dr. Erick Colace.   DESCRIPTION OF PROCEDURE:  There were needles placed at CV2, CV4,  bilateral TH8, bilateral GB34, bilateral LR3 and these were hooked up to  electrical stimulation for her some 30 minutes.  In addition, bilateral  Wang needles placed in the bilateral dorsal web spaces of the feet and  right shen-men and right thalamus auricular points were added.  The  patient tolerated the procedure well.   Post-procedure instructions were given.      Erick Colace, M.D.  Electronically Signed     AEK/MEDQ  D:  10/02/2007 16:07:31  T:  10/03/2007 14:14:09  Job:  [id number]

## 2011-01-05 NOTE — Procedures (Signed)
NAMEABIHA, LUKEHART NO.:  [id number]   MEDICAL RECORD NO.:  [id number]          PATIENT TYPE:  REC   LOCATION:  TPC                          FACILITY:  [facility]   PHYSICIAN:  Erick Colace, M.D.DATE OF BIRTH:  Jul 06, 1930   DATE OF PROCEDURE:  DATE OF DISCHARGE:                               OPERATIVE REPORT   Ms. Dizdarevic returns today.  Her last visit was on February 28, 2008.  She  has complaints of her bladder pain, interstitial cystitis, bilateral  lower extremity pain, numbness, tingling, and anxiety.  Needle was  placed at bilateral TH-8, GB-34, LS-3, E-Stim between LR-3 and GB-34.  Needle was placed at DU-24.5, DU-20, and between CV-2 and CV-4 with E-  Stim 4 Hz x30 minutes. Also, auricular needle was placed at the left  thalamus and left shen-men.  See her back in 10 days for repeat  acupuncture treatment.  Her pain has been well controlled with  acupuncture and has been able to minimize usage of medications and pain  does limit her ADLs and mobility.      Erick Colace, M.D.  Electronically Signed     AEK/MEDQ  D:  03/08/2008 15:56:30  T:  03/09/2008 06:56:02  Job:  [id number]

## 2011-01-05 NOTE — Procedures (Signed)
NAME:  [name], [name]          ACCOUNT NO.:  [id number]   MEDICAL RECORD NO.:  [id number]           PATIENT TYPE:   LOCATION:                                 FACILITY:   PHYSICIAN:  Erick Colace, M.D.DATE OF BIRTH:  [birth year]   DATE OF PROCEDURE:  DATE OF DISCHARGE:                               OPERATIVE REPORT   Acupuncture Treatment   Consists of points placed at CV2, CV4, bilateral TH8, bilateral GB34,  bilateral LV3 as well as bilateral Bafeng needles at the dorsal web  spaces of the toes as well as DU20 and DU 24.5 E-stem, 4 Hertz x 30  minutes.  The patient tolerated the procedure well. Will return in about  10 days.      Erick Colace, M.D.  Electronically Signed     AEK/MEDQ  D:  10/12/2007 17:02:51  T:  10/13/2007 14:18:47  Job:  [id number]

## 2011-01-05 NOTE — Assessment & Plan Note (Signed)
HISTORY:  [name] is originally scheduled for an acupuncture today.  However, in the interval time period, she is following and would like  further evaluation.  She states her fall occurred approximately a week  ago.  She was standing on a chair with wheels on it, trying to swat a  fly on the ceiling.  She fell on her side.  She cannot really tell  exactly how she felt.  Her exacerbation pain is in her lumbar area.  She  has had no bowel or bladder dysfunction.  She has had no lower extremity  weakness.  She is able to walk, but uses a walker due to pain.   PHYSICAL EXAMINATION:  Her lower extremity strength is full.  Normal  deep tendon reflexes.  She has muscle spasm in bilateral lumbar  paraspinal.  Her lumbar range of motion is limited by 25% forward  flexion and extension, lateral rotation, and bending.  She walks into  forward flexion posture.   IMPRESSION:  Fall causing full back pain in addition to her usual pain  related to her interstitial cystitis.  We will take some x-rays of the  lumbar spine, and I think this is emergent.  She can get this done  sometime in the next couple of days.  She does have a history of  compression fracture.  We will check if she has another level besides  the L2, which is known.      Erick Colace, M.D.  Electronically Signed     AEK/MedQ  D:  04/01/2008 18:26:58  T:  04/02/2008 09:34:33  Job #:  [id number]

## 2011-01-05 NOTE — Procedures (Signed)
[name], KUWAHARA NO.:  [id number]   MEDICAL RECORD NO.:  [id number]          PATIENT TYPE:  REC   LOCATION:  [facility]                          FACILITY:  [facility]   PHYSICIAN:  Erick Colace, M.D.DATE OF BIRTH:  August 01, 1930   DATE OF PROCEDURE:  02/14/2007  DATE OF DISCHARGE:                               OPERATIVE REPORT   This is an acupuncture treatment. For the year,this is number 11.   Patient returns today, last seen by me January 23, 2007.   Interval history, has had a repeat MRI of the lumbar spine.  She showed  some progressive loss of height at L2 with neural edema and is, per her  report, going to be scheduled for what sounds like vertebroplasty.  Needles placed bilaterally at LR3, TH8, GB34, CV2, CV4, DU20, DU24.5,  left thalamus point, left shen-men.  E-Stim between all points except  auricular points.  In addition, vafeng points placed at the dorsal web  spaces of the toes.  Treatment time of 30 minutes, 4 Hz Stim.  The  patient tolerated the procedure well.  I will see her back in  approximately 10 days.      Erick Colace, M.D.  Electronically Signed     AEK/MEDQ  D:  02/14/2007 15:52:27  T:  02/15/2007 09:43:53  Job:  [id number]   cc:   Payton Doughty, M.D.  Fax: [phone]   Almedia Balls. Ranell Patrick, M.D.  Fax: [phone]

## 2011-01-05 NOTE — Procedures (Signed)
[name], [name] NO.:  [id number]   MEDICAL RECORD NO.:  [id number]           PATIENT TYPE:   LOCATION:                                 FACILITY:   PHYSICIAN:  Erick Colace, M.D.DATE OF BIRTH:  06-26-1930   DATE OF PROCEDURE:  DATE OF DISCHARGE:                               OPERATIVE REPORT   Acupuncture treatment performed by Bafeng.  Needles placed at web space  of toes bilaterally.  Also right auricular shen-men and thalamus placed,  CV2, CV4 placed and needles placed at GB25, BL52 and BL53.  Electrical  stim 4 hertz times 30 minutes.  The patient tolerated the procedure  well.   Return in 10-14 days.      Erick Colace, M.D.  Electronically Signed     AEK/MEDQ  D:  05/25/2007 15:44:12  T:  05/26/2007 10:41:51  Job:  [id number]

## 2011-01-05 NOTE — Procedures (Signed)
[name], BUIST          ACCOUNT NO.:  [id number]   MEDICAL RECORD NO.:  [id number]          PATIENT TYPE:  REC   LOCATION:  [facility]                          FACILITY:  [facility]   PHYSICIAN:  Erick Colace, M.D.DATE OF BIRTH:  1930-01-23   DATE OF PROCEDURE:  01/23/2007  DATE OF DISCHARGE:                               OPERATIVE REPORT   Acupuncture treatment for low back pain, history of upper lumbar  superior endplate fracture as well as chronic radiculitis and thoracic  spondylosis.   Informed consent.   The patient placed in supine position.  The patient had needles placed  at LR3 bilaterally, TH8 bilaterally, TB34 bilaterally, CV2, CV4, DU20,  DU24.5 as well as auricular point right thalamus and SHEN-MEN, E stim @  4Hz  between all points with the exception of TH8 and auricular points.  The patient tolerated the procedure well.  Postprocedure instructions  given.  Return in 10 days.      Erick Colace, M.D.  Electronically Signed     AEK/MEDQ  D:  01/23/2007 13:48:49  T:  01/23/2007 16:26:03  Job:  [id number]   cc:   Payton Doughty, M.D.  Fax: [phone]   Almedia Balls. Ranell Patrick, M.D.  Fax: [phone]

## 2011-01-05 NOTE — Procedures (Signed)
NAMEMARIDEE, SLAPE NO.:  [id number]   MEDICAL RECORD NO.:  [id number]          PATIENT TYPE:  REC   LOCATION:  [facility]                          FACILITY:  [facility]   PHYSICIAN:  Erick Colace, M.D.DATE OF BIRTH:  Jan 18, 1930   DATE OF PROCEDURE:  07/13/2007  DATE OF DISCHARGE:                               OPERATIVE REPORT   [name] returns today for acupuncture treatment.  Needles were  placed at CV2, CV4, bilateral TH8, bilateral TB34, bilateral LR3. Bafeng  needle was placed in the bilateral feet dorsal web spaces right Shen-Men  auricular points as well as right GB26, right GB25.  We utilized 4 hertz  x30 minutes.  In addition, DU24.5 and DU20, we used 4 hertz x30 minutes.  The patient tolerated the procedure well.  Post procedure instructions  given.  Return in 10-14 days.      Erick Colace, M.D.  Electronically Signed     AEK/MEDQ  D:  07/13/2007 16:33:27  T:  07/14/2007 81:19:14  Job:  [id number]

## 2011-01-05 NOTE — Procedures (Signed)
[name], KREAGER NO.:  [id number]   MEDICAL RECORD NO.:  [id number]          PATIENT TYPE:  REC   LOCATION:  [facility]                          FACILITY:  [facility]   PHYSICIAN:  Erick Colace, M.D.DATE OF BIRTH:  1930-03-31   DATE OF PROCEDURE:  11/02/2007  DATE OF DISCHARGE:                               OPERATIVE REPORT   This is acupuncture treatment.  Pain related to interstitial cystitis,  paresthesias, and dysesthesias, thoracic spondylosis.   Needles placed at CV2, CV4, bilateral TH8, bilateral GB34, bilateral LV  as in liver 3, bilateral Eafeng needles placed in dorsal web space  between the toes as well as DU20, DU24.5 and right auricular thalamus  point, right auricular Shen-Men.  The patient tolerated the procedure  well and will return in approximately 10 days.      Erick Colace, M.D.  Electronically Signed     AEK/MEDQ  D:  11/02/2007 17:05:22  T:  11/03/2007 10:42:54  Job:  [id number]

## 2011-01-05 NOTE — Procedures (Signed)
[name], [name]          ACCOUNT NO.:  [id number]   MEDICAL RECORD NO.:  [id number]          PATIENT TYPE:  REC   LOCATION:  [facility]                          FACILITY:  [facility]   PHYSICIAN:  Erick Colace, M.D.DATE OF BIRTH:  Oct 06, 1929   DATE OF PROCEDURE:  04/25/2007  DATE OF DISCHARGE:                               OPERATIVE REPORT   Acupuncture treatment for lower extremity pain, anxiety, depression,  interstitial cystitis.   Needle was placed at bilateral LI3, TH8, GB34 bilaterally as well as  CV2, CV4, DU20, DU24.5, left thalamus and left shen-men.  In addition,  Bafeng needle was placed in the web spaces of the toes.  On the right  side, additional points at [facility] and [id number].  E-Stim - 4 Hertz times 30  minutes.  The patient tolerated the procedure well.  She states she is  out of the Oxycodone that I prescribed, 90 tablets, back in April.  We  will write for 30 tablets given her rare usage pattern.  She mainly  takes this at night if she cannot sleep due to pain.      Erick Colace, M.D.  Electronically Signed     AEK/MEDQ  D:  04/25/2007 11:34:35  T:  04/25/2007 13:23:00  Job:  [id number]

## 2011-01-05 NOTE — Procedures (Signed)
NAMEARDEL, JAGGER          ACCOUNT NO.:  [id number]   MEDICAL RECORD NO.:  [id number]          PATIENT TYPE:  REC   LOCATION:  [facility]                          FACILITY:  [facility]   PHYSICIAN:  Erick Colace, M.D.DATE OF BIRTH:  1929-10-12   DATE OF PROCEDURE:  04/04/2007  DATE OF DISCHARGE:                               OPERATIVE REPORT   PROCEDURE PERFORMED:  Acupuncture treatment.  This is for lower  extremity pain, anxiety, depression, interstitial cystitis.   Needles placed bilaterally at LR3, TH8, GB34 bilaterally as well as CB2,  CB4, DU20, DU24.5.  Auricular point right thalamus and shen-men.  In  addition ba-feng needles were placed to web spaces of the toes.  These  in between CB2-CB4, TH8 and GB34, and DU20-DU24.5, 4 Hz times 30  minutes.  The patient tolerated the procedure well.  She will return in  two weeks for repeat treatment.      Erick Colace, M.D.  Electronically Signed     AEK/MEDQ  D:  04/04/2007 12:01:15  T:  04/05/2007 13:05:42  Job:  [id number]

## 2011-01-05 NOTE — Procedures (Signed)
NAMEFLORENDA, WATT NO.:  [id number]   MEDICAL RECORD NO.:  [id number]         PATIENT TYPE:  AECP   LOCATION:                                 FACILITY:   PHYSICIAN:  Erick Colace, M.D.DATE OF BIRTH:  Jan 01, 1930   DATE OF PROCEDURE:  03/18/2008  DATE OF DISCHARGE:                               OPERATIVE REPORT   Last visit was March 09, 2008.  Recurrent current complaints of  interstitial cystitis pain, bilateral lower extremity pain, numbness,  tingling, and anxiety.  Her symptoms are only partially responsive to  medication management and physical therapy.   Needles placed at bilateral TH-8, GB-34, LD-3 with E-Stim between LB-3  and GB-34.  Also, needle placed at the DU-24.5 and DU-20 in between CV-2  and CV-4 with E-Stim 4 Hz x 30 minutes.  Auricular needle placed at left  thalamus and left Shen-Men.  In addition, Bafeng needles placed at the  dorsal web space of the toes.  Pain has been well controlled with the  acupuncture, able to minimize usage of medications, and pain does limit  her ADLs and mobility.      Erick Colace, M.D.  Electronically Signed     AEK/MEDQ  D:  03/18/2008 16:28:07  T:  03/19/2008 07:46:36  Job:  [id number]

## 2011-01-05 NOTE — Procedures (Signed)
[name], KISS NO.:  [id number]   MEDICAL RECORD NO.:  [id number]          PATIENT TYPE:  REC   LOCATION:  [facility]                          FACILITY:  [facility]   PHYSICIAN:  Erick Colace, M.D.DATE OF BIRTH:  September 06, 1929   DATE OF PROCEDURE:  DATE OF DISCHARGE:                               OPERATIVE REPORT   Acupuncture treatment.  Needles placed at CV2, CV4, bilateral TH8,  bilateral GB34, bilateral LR3.  Ba feng needles placed in bilateral feet  dorsal web spaces as well as right shen-men auricular points.  In  addition, DU24.5, DU20 were utilized.  4 Hz 30 minutes.  The patient  tolerated the procedure well.  Return in 10-14 days.      Erick Colace, M.D.  Electronically Signed     AEK/MEDQ  D:  07/31/2007 16:24:06  T:  08/01/2007 07:55:27  Job:  [id number]

## 2011-01-05 NOTE — Procedures (Signed)
[name], NORKUS          ACCOUNT NO.:  [id number]   MEDICAL RECORD NO.:  [id number]          PATIENT TYPE:  REC   LOCATION:  [facility]                          FACILITY:  [facility]   PHYSICIAN:  Erick Colace, M.D.DATE OF BIRTH:  Nov 24, 1929   DATE OF PROCEDURE:  07/04/2007  DATE OF DISCHARGE:                               OPERATIVE REPORT   Treatment today consist of needles placed DU20, DU24.5, CV2, CV4,  bilateral DH8, bilateral GB34, LR3.  Bah feng needles placed bilateral  feet at dorsal web spaces.  Right  thalamus, right shen men auricular  points and right SP2, SP3, SP4, E-stim 4 Hz for 30 minutes.   The patient tolerated the procedure well.      Erick Colace, M.D.  Electronically Signed     AEK/MEDQ  D:  07/04/2007 16:38:39  T:  07/05/2007 10:52:45  Job:  [id number]

## 2011-01-05 NOTE — Assessment & Plan Note (Signed)
[name] has pain along the shoulders, along the entire back, and  lower extremity and hands.  She states her pain is 10 out of 10 at  times.  She states that she is getting good relief from medical  acupuncture and neuromuscular therapy.  She wants to go back to the  [employer] in [place].  She states that she did not do as  well with oxycodone as well as Percocet.  She sometimes uses Darvocet  for certain types of pain.  She uses Soma 250-350 mg as needed.  She  takes several supplements including vitamin C, E, grapeseed, OsteoPrime,  Ginkgo, __________ super mega vitamins and others that she provided on a  list that can be referred to.   She states that today she has had a paroxysm on the way to the office  here.   PHYSICAL EXAMINATION:  VITAL SIGNS:  Her blood pressure today is 180/92,  pulse 92, respirations 18, O2 sat 94% on room air.  Blood pressure  was  rechecked at 155/72.  MUSCULOSKELETAL:  Demonstrates mild diffuse tenderness along the entire  cervical, thoracic, and lumbar spine.  She has good strength bilateral  upper and lower extremities.  She has very good range of motion in the  lumbar spine both in flexion and in extension.  Her gait is without  evidence of toe drag or knee instability.   IMPRESSION:  1. Thoracic spondylosis without myelopathy.  2. History of lumbar compression fracture at the L2 level and some      chronic pain in her lower back area.  3. Dysesthesias and paresthesias.  4. Interstitial cystitis.   PLAN:  1. We will continue acupuncture treatments.  2. We will give her Percocet 5/325, she really uses this usually once      a day and very rarely would she like to take it 3 times a day      because it makes her tired.  She does not drive when she is taking      this.  3. I will see her back for repeat acupuncture treatment.      Erick Colace, M.D.  Electronically Signed     AEK/MedQ  D:  06/15/2007 17:07:33  T:   06/16/2007 15:02:11  Job #:  [id number]   cc:   Danae Orleans. Venetia Maxon, M.D.  Fax: [phone]

## 2011-01-05 NOTE — Procedures (Signed)
NAMEASHIAH, KARPOWICZ NO.:  [id number]   MEDICAL RECORD NO.:  [id number]         PATIENT TYPE:  HREC   LOCATION:                                 FACILITY:   PHYSICIAN:  Erick Colace, M.D.DATE OF BIRTH:  [birth year]   DATE OF PROCEDURE:  04/23/2008  DATE OF DISCHARGE:                               OPERATIVE REPORT   LAST TREATMENT:  April 11, 2008.   Acupuncture treatment.  Needles placed at left BL21, left BL27, right  BL21, right BL23, right BL25, right BL27 as well as CV2, CV4, DU 20, DU  24.5, right GB30, right GB31, right GB34, and auricular needles were  placed at the left thalamus and left shen-men.  A 4 Hz x30 minutes, the  patient tolerated the procedure well and will return in 10 days.      Erick Colace, M.D.  Electronically Signed     AEK/MEDQ  D:  04/24/2008 09:42:19  T:  04/24/2008 23:47:06  Job:  [id number]

## 2011-01-05 NOTE — Procedures (Signed)
[name], [name] NO.:  [id number]   MEDICAL RECORD NO.:  [id number]          PATIENT TYPE:  REC   LOCATION:  [facility]                          FACILITY:  [facility]   PHYSICIAN:  Erick Colace, M.D.DATE OF BIRTH:  09-10-1929   DATE OF PROCEDURE:  DATE OF DISCHARGE:                               OPERATIVE REPORT   HISTORY:  [name] returns today.  Her last visit was on February 15, 2008.  Her carpal tunnel type symptoms have improved after using some  splinting.   She continues to have complaints of interstitial cystitis, as well as  bilateral lower extremity pain, numbness, tingling, and anxiety.   Needles placed to bilateral TH8, GB34, LR3.  E-stim between LR3 and  GB34.  Also, needles placed at DU24.5, DU20, E-stim Hz x30 minutes, and  between CV2 and CV4 with E-stim 4 Hz x30 minutes.  The patient tolerated  the procedure well.  I will see her back in 10 days for acupuncture.  Hold off on EMG at this point.      Erick Colace, M.D.  Electronically Signed     AEK/MEDQ  D:  02/27/2008 10:45:00  T:  02/28/2008 06:57:28  Job:  [id number]

## 2011-01-05 NOTE — Procedures (Signed)
NAMEMAHNOOR, MATHISEN NO.:  [id number]   MEDICAL RECORD NO.:  [id number]          PATIENT TYPE:  EMS   LOCATION:  [facility]                          FACILITY:  [facility]   PHYSICIAN:  Erick Colace, M.D.DATE OF BIRTH:  1929-12-04   DATE OF PROCEDURE:  04/11/2008  DATE OF DISCHARGE:  04/03/2008                               OPERATIVE REPORT   This is an acupuncture treatment dated April 11, 2008.  Needles placed  at bilateral BL 21, BL 23, BL 25, BL 27, and left BL 36 as well as CV-2,  CV-4, DU-20, DU-24.5.  E-Stim 4 Hz x30 minutes.  Auricular needle was  placed at left thalamus and left shen-men.  The patient tolerated the  procedure well.  Return in 10 days.      Erick Colace, M.D.  Electronically Signed     AEK/MEDQ  D:  04/11/2008 12:12:56  T:  04/12/2008 01:34:02  Job:  [id number]

## 2011-01-05 NOTE — Procedures (Signed)
[name], [name] NO.:  [id number]   MEDICAL RECORD NO.:  [id number]          PATIENT TYPE:  REC   LOCATION:  [facility]                          FACILITY:  [facility]   PHYSICIAN:  Erick Colace, M.D.DATE OF BIRTH:  September 30, 1929   DATE OF PROCEDURE:  DATE OF DISCHARGE:                               OPERATIVE REPORT   PROCEDURE:  Acupuncture treatment.   Pain is only partially responsive to medication management and physical  therapy.  She is here for lower extremity pain, numbness, interstitial  cystitis pain in the pelvic region and bladder.   Needles placed at CV2, CV4, left auricular thalamus point, left  auricular shen-men, bilateral TH8, bilateral GB34, bilateral LV3 as well  as DU20, DU24.5.  E-Stim 4 Hz times 30 minutes.  The patient tolerated  the procedure well.   We will see her back in 10 days.      Erick Colace, M.D.  Electronically Signed     AEK/MEDQ  D:  12/18/2007 14:16:39  T:  12/18/2007 15:04:10  Job:  [id number]

## 2011-01-05 NOTE — Procedures (Signed)
NAMELERIN, JECH NO.:  [id number]   MEDICAL RECORD NO.:  [id number]          PATIENT TYPE:  REC   LOCATION:  [facility]                          FACILITY:  [facility]   PHYSICIAN:  Erick Colace, M.D.DATE OF BIRTH:  August 30, 1929   DATE OF PROCEDURE:  05/17/2008  DATE OF DISCHARGE:                               OPERATIVE REPORT   Barkett returns today.  She was last seen by me on May 06, 2008.  She has had no changes in her medical status.  Oswestry score is 58%  which compares with 60% last visit, overall stable.   Needles placed on the right BL21, BL23, BL25, BL27, right GB31, GB34,  BL60, and GB42.  E-Stim 4 Hz x30 minutes.  In addition CB2 and CB4 was  placed.  E-Stim 4 Hz x30 minutes as well as DU20, DU24.5, 4 Hz x30  minutes.  Auricular needle was placed on the right Shen-Men as well as  right thalamus.  The patient tolerated the procedure well.  I will see  her back in 2 weeks.      Erick Colace, M.D.  Electronically Signed     AEK/MEDQ  D:  05/17/2008 15:34:52  T:  05/18/2008 04:32:52  Job:  [id number]

## 2011-01-05 NOTE — Procedures (Signed)
NAMEKAURI, GARSON NO.:  [id number]   MEDICAL RECORD NO.:  [id number]          PATIENT TYPE:  REC   LOCATION:  [facility]                          FACILITY:  [facility]   PHYSICIAN:  Erick Colace, M.D.DATE OF BIRTH:  12/13/1929   DATE OF PROCEDURE:  12/28/2007  DATE OF DISCHARGE:                               OPERATIVE REPORT   REASON FOR VISIT:  Acupuncture for progressive spondylosis without  myelopathy as well as interstitial cystitis as well as chronic  posttraumatic pain.   PROCEDURE:  Acupuncture treatment performed today with needles placed at  CV2, CV4, auricular thalamus point, auricular shen-men point on the  right side, DU20, DU24.5 as well as bilateral LV3, bilateral TH8,  bilateral GB34, E-stim 4 Hz x30 minutes. The patient tolerated the  procedure well. I will see her back in approximately 10 day. This has  been the usual duration of her effect.      Erick Colace, M.D.  Electronically Signed     AEK/MEDQ  D:  12/28/2007 14:06:58  T:  12/28/2007 14:41:46  Job:  [id number]

## 2011-01-05 NOTE — Assessment & Plan Note (Signed)
Patient originally scheduled just for acupuncture treatment.  However,  she has had a recent fall and has had increased pain right buttock.  She  has had injuries like this in the past.  She has had pain over the  buttock area on the right side only.  She has no lower extremity  numbness, weakness or tingling.  She has no hip, knee or ankle pain with  weightbearing.  She is able to walk.  She is able to drive and do all  her self care.   EXAMINATION:  GENERAL:  No acute distress.  Mood and affect appropriate.  Her gait is normal.  She has tenderness over the left PSIS area with palpation.   IMPRESSION:  Right-sided buttock pain.  Pain over PSIS consistent with  sacroiliac area.  We will do diagnostic injection under fluoroscopic  guidance in one to two weeks.  This will be in order to further  evaluate.      Erick Colace, M.D.  Electronically Signed     AEK/MedQ  D:  11/14/2007 16:23:30  T:  11/14/2007 21:31:44  Job #:  [id number]

## 2011-01-05 NOTE — Procedures (Signed)
[name], BOEHLKE          ACCOUNT NO.:  [id number]   MEDICAL RECORD NO.:  [id number]          PATIENT TYPE:  REC   LOCATION:  [facility]                          FACILITY:  [facility]   PHYSICIAN:  Erick Colace, M.D.DATE OF BIRTH:  02-01-1930   DATE OF PROCEDURE:  05/05/2007  DATE OF DISCHARGE:                               OPERATIVE REPORT   Acupuncture treatment today consists of points placed at CV2, CV4, DU20,  DU24.5, left auricular thalamus and shen-man, bilateral TH8, bilateral  TV34, and bilateral LR3, as well as Bafeng needles in the web spaces of  the toes.  Stim except for the Bafeng needles and LR3 at 4 hertz x30  minutes.  The patient tolerated the procedure well.   Her blood pressure was 192/94, rechecked 190, and the patient was asked  to follow up with her primary care physician.      Erick Colace, M.D.  Electronically Signed     AEK/MEDQ  D:  05/05/2007 16:38:20  T:  05/05/2007 23:40:25  Job:  [id number]

## 2011-01-08 NOTE — Procedures (Signed)
NAMEDOYNE, ELLINGER NO.:  [id number]   MEDICAL RECORD NO.:  [id number]            PATIENT TYPE:   LOCATION:                                 FACILITY:   PHYSICIAN:  Erick Colace, M.D.   DATE OF BIRTH:   DATE OF PROCEDURE:  02/03/2006  DATE OF DISCHARGE:                                 OPERATIVE REPORT   PROCEDURE:  Acupuncture treatment.  Last treatment done 01/20/2006.   COMPLAINTS:  Pain in the feet greater than the hands as well as low back.  She complains of anxiety, stress, and interstitial cystitis.   DESCRIPTION OF PROCEDURE:  Needle was placed at DU-20, DU-24.5, as well as  CV-2, and CV-4 each end between the points.  In addition, bilateral TH-8,  bilateral LR-3 and GB-34 were placed with electrical stimulation between TH-  8 and GB-34.  Auricular needle placed left thalamus point.   Bafeng needle is placed in the web spaces between the toes.   Electrical stimulation between DU-20, DU-24.5, CV-2, CV-4, THE-8 and GB-34  at 4 Hz 30 minutes.  The patient tolerated the procedure well and will  return in 1 week.      Erick Colace, M.D.  Electronically Signed     AEK/MEDQ  D:  02/03/2006 14:41:32  T:  02/03/2006 15:22:33  Job:  [id number]   cc:   Rene Kocher, M.D.  Fax: [phone]

## 2011-01-08 NOTE — Procedures (Signed)
[name], [name] NO.:  [id number]   MEDICAL RECORD NO.:  [id number]          PATIENT TYPE:  REC   LOCATION:  [facility]                          FACILITY:  [facility]   PHYSICIAN:  Erick Colace, M.D.DATE OF BIRTH:  [birth year]   DATE OF PROCEDURE:  DATE OF DISCHARGE:                                 OPERATIVE REPORT   MEDICAL RECORD NUMBER:  [id number]   DATE OF BIRTH:  [birth year]   PROCEDURE:  Acupuncture treatment #6.   INDICATION FOR PROCEDURE:  Patient complaining of her frequent urination  attributed to an interstitial cystitis as well as pain in buttocks,  shoulders, hands and feet.   DESCRIPTION OF PROCEDURE:  Acupuncture treatment performed today, bilateral  LV-3, bilateral TH-8, bilateral GB-34, DU-20, DU-24.5, CV-2 and CV-4,  electrical stimulation between DU-20 and DU-24.5 at 2 Hz alternating with 10  Hz in bilateral TH-8 and GB-34 at 4 Hz and between CV-2 and CV4 at 4 Hz.  The patient tolerated the procedure well.  In addition, auricular point  thalamus placed on the left and a piece of needle tack placed at thalamus  point on right.   The patient will remove the right-sided point if she experiences any  irrigation.  I will recheck her in 1 week for repeat treatment.      Erick Colace, M.D.  Electronically Signed     AEK/MEDQ  D:  10/21/2005 13:35:41  T:  10/22/2005 04:48:18  Job:  [id number]   cc:   Rene Kocher, M.D.  Fax: [phone]

## 2011-01-08 NOTE — Procedures (Signed)
[name], [name] NO.:  [id number]   MEDICAL RECORD NO.:  [id number]          PATIENT TYPE:  REC   LOCATION:  [facility]                          FACILITY:  [facility]   PHYSICIAN:  Erick Colace, M.D.DATE OF BIRTH:  11/29/1929   DATE OF PROCEDURE:  05/30/2006  DATE OF DISCHARGE:                                 OPERATIVE REPORT   ACUPUNCTURE TREATMENT:  Treatment last performed on May 24, 2006.   Needles placed at DU20, DU24.5, bilateral TH8, bilateral GB34, bilateral  baifang in the web space of the toes bilaterally, left auricular point  thalamus, and left shenmen, as well as needles placed at CB2 and CB4,  electrical stimulation between all points except for baifang and LR3.  Electrical stimulation of 4 Hz x30 minutes.  The patient tolerated the  procedure well.      Erick Colace, M.D.  Electronically Signed     AEK/MEDQ  D:  05/30/2006 15:01:45  T:  05/31/2006 16:10:96  Job:  [id number]

## 2011-01-08 NOTE — Procedures (Signed)
NAMERETINA, BERNARDY NO.:  [id number]   MEDICAL RECORD NO.:  [id number]          PATIENT TYPE:  REC   LOCATION:  [facility]                          FACILITY:  [facility]   PHYSICIAN:  Erick Colace, M.D.DATE OF BIRTH:  01/18/1930   DATE OF PROCEDURE:  01/14/2006  DATE OF DISCHARGE:                                 OPERATIVE REPORT   PROCEDURE:  Acupuncture treatment for low back pain associated with stress  and anxiety.  She had recent fall, resulting pain which localized over her  sacral area, none in the thoracic or lumbar spine area, no tenderness in  these areas, tender over the SI joints bilaterally as well as the PSIS.   Treatment today consisted of points placed at BL 27, GB 30, and BL 31.  Electrical stimulation and heat over these areas also DU 24.5 and DU 20 were  utilized with electrical stimulation between the 2 points 30 minutes.  I  will also give her samples of Lidoderm patch.  Return in 1 week repeat  treatment, not feeling any better in her sacral area, we will send for  lumbosacral x-rays.      Erick Colace, M.D.  Electronically Signed     AEK/MEDQ  D:  01/14/2006 12:40:12  T:  01/15/2006 13:32:30  Job:  [id number]

## 2011-01-08 NOTE — Procedures (Signed)
NAMEMARGURITE, [name]          ACCOUNT NO.:  [id number]   MEDICAL RECORD NO.:  [id number]          PATIENT TYPE:  REC   LOCATION:  [facility]                          FACILITY:  [facility]   PHYSICIAN:  Erick Colace, M.D.DATE OF BIRTH:  Apr 24, 1930   DATE OF PROCEDURE:  09/30/2006  DATE OF DISCHARGE:                               OPERATIVE REPORT   TREATMENT:  Acupuncture treatment at CV2, CV4, bilateral GB34, TH8 and  LR.  Electrical stimulations LR3, GB34, CV2, CV4, as well as between  DU20 and DU24.5, right shenmen and thalamus points in the auricular  area.   TREATMENT TIME:  Thirty minutes.   The patient tolerated the procedure well.  Return in 2 weeks.      Erick Colace, M.D.  Electronically Signed     AEK/MEDQ  D:  09/30/2006 14:58:22  T:  09/30/2006 17:52:26  Job:  [id number]

## 2011-01-08 NOTE — Procedures (Signed)
NAMENINI, CAVAN NO.:  [id number]   MEDICAL RECORD NO.:  [id number]          PATIENT TYPE:  REC   LOCATION:  [facility]                          FACILITY:  [facility]   PHYSICIAN:  Erick Colace, M.D.DATE OF BIRTH:  28-May-1930   DATE OF PROCEDURE:  10/01/2005  DATE OF DISCHARGE:                                 OPERATIVE REPORT   MEDICAL RECORD NUMBER:  [id number].   This is an acupuncture treatment, #3.   Pain is in the back of his thighs greater than anterior thighs. He feels  that the acupuncture treatment that we have done thus far has been helpful  for anterolateral type pain. She has 6/10 pain now, down from 9/10 before.  Pain interference score is general activity 7, relationships with other  people 7, enjoyment of life 9. Sleep is fair.   Treatment today consisted of bilateral GB23, bilateral BL23, bilateral BL52,  DU4, bilateral BL25 as well as bilateral BL40 and bilateral BL57. Electrical  stimulation between bilateral BL40 and 57 as well as between cross needles  of BL23/BL52 bilaterally, 4 hertz stimulation, 30 minutes. The patient  tolerated the procedure well. I will see her back in one week.      Erick Colace, M.D.  Electronically Signed     AEK/MEDQ  D:  10/01/2005 13:03:28  T:  10/02/2005 09:59:50  Job:  [id number]

## 2011-01-08 NOTE — Procedures (Signed)
NAMEMARIELI, RUDY NO.:  [id number]   MEDICAL RECORD NO.:  [id number]          PATIENT TYPE:  REC   LOCATION:  [facility]                          FACILITY:  [facility]   PHYSICIAN:  Erick Colace, M.D.DATE OF BIRTH:  22-May-1930   DATE OF PROCEDURE:  05/16/2006  DATE OF DISCHARGE:                                 OPERATIVE REPORT   Acupuncture treatment last performed May 10, 2006.  Needles placed at  BU20, BU24.5, bilateral TH8, bilateral GB34, bilateral  baifeng in the web  space of the toes bilaterally, auricular point, right, right thalamus and  right shenmen, and needles placed at CV2 and 4.  Electrical stimulation  between all points except for baifeng and auricular points.  The patient  tolerated the procedure well.  Treatment time 30 minutes.  I will see her  back in 1 week and then we will try to spread out treatments to 2 weeks.      Erick Colace, M.D.  Electronically Signed     AEK/MEDQ  D:  05/16/2006 15:21:15  T:  05/18/2006 15:13:17  Job:  [id number]

## 2011-01-08 NOTE — Procedures (Signed)
[name], [name]          ACCOUNT NO.:  [id number]   MEDICAL RECORD NO.:  [id number]          PATIENT TYPE:  REC   LOCATION:  [facility]                          FACILITY:  [facility]   PHYSICIAN:  Erick Colace, M.D.DATE OF BIRTH:  03/05/1930   DATE OF PROCEDURE:  DATE OF DISCHARGE:  12/11/2006                               OPERATIVE REPORT   ACUPUNCTURE VISIT:  Back pain due to lumbar compression fracture, as  well as problems with interstitial cystitis and anxiety.   Treatment today consisted of DU-20, DU-24.5, left Shen-men and left  thalamus auricular points.  Left GB-34, left GB-43, left TH-8, left BL-  21, 23, 25, 27, as well as left BL 51, 52, 53, 54.  Electrical  stimulation 4 Hz for 30 minutes.  The patient tolerated the procedure  well.  Will see her back in one week.      Erick Colace, M.D.  Electronically Signed     AEK/MEDQ  D:  12/12/2006 16:47:36  T:  12/13/2006 07:25:01  Job:  [id number]

## 2011-01-08 NOTE — Op Note (Signed)
NAME:  [name], [name]                    ACCOUNT NO.:  [id number]   MEDICAL RECORD NO.:  [id number]                   PATIENT TYPE:  AMB   LOCATION:  DAY                                  FACILITY:  [facility]   PHYSICIAN:  Velora Heckler, M.D.                DATE OF BIRTH:  19-Nov-1929   DATE OF PROCEDURE:  08/12/2003  DATE OF DISCHARGE:                                 OPERATIVE REPORT   PREOPERATIVE DIAGNOSIS:  Chronic cholecystitis, cholelithiasis.   POSTOPERATIVE DIAGNOSIS:  Chronic cholecystitis, cholelithiasis.   OPERATION/PROCEDURE:  Laparoscopic cholecystectomy.   SURGEON:  Velora Heckler, M.D.   ASSISTANT:  Lorne Skeens. Hoxworth, M.D.   ANESTHESIA:  General.   ESTIMATED BLOOD LOSS:  Minimal.   PREPARATION:  Betadine.   COMPLICATIONS:  None.   INDICATIONS:  The patient is a 77 year old white female who presents with  newly diagnosed gallstones.  This patient had an episode of abdominal pain  and diarrhea in late September and again in early October.  She was seen by  Dr. Victorino Dike.  She described a history of intermittent epigastric and  right upper quadrant  abdominal pain and bloating.  The patient underwent CT  scan which demonstrates gallstones.  Ultrasound at [facility]  demonstrated multiple gallstones.  Liver function tests were normal.  The  patient was referred to surgery and prepared for the operating room.   DESCRIPTION OF PROCEDURE:  The procedure was done in OR #1 at the [facility].  The patient is brought to the operating room,  placed in the supine position on the operating room table.  Following the  administration of general anesthesia, the patient is prepped and draped in  the usual strict aseptic fashion.  After ascertaining adequate level of  anesthesia had been obtained, an infraumbilical incision is made in the  midline with a #15 blade.  Dissection is carried down through the  subcutaneous tissues.  Fascia is  incised in the midline and the peritoneal  cavity is entered cautiously.  A 0 Vicryl pursestring suture is placed.  A  Hasson cannula is introduced and secured with the pursestring suture.  The  abdomen is insufflated with carbon dioxide.  Laparoscope is introduced.  There is a band of adhesions across the patient's previous right transverse  abdominal incision.  These are due to omentum.  There was no bowel present.  With gentle blunt dissection with the scope, a window is made in the mid  portion of the adhesions so as to visualize the right upper quadrant.  Operative ports are placed along the right costal margin in the midline,  midclavicular line, and the anterior axillary line.  Fundus of the  gallbladder is grasped and retracted cephalad.  Dissection is begun at the  neck of the gallbladder.  The gallbladder is moderately intrahepatic.  Peritoneum is incised around the neck of the gallbladder.  Cystic duct is  identified, dissected out, triply clipped, and divided.  Cystic artery is  dissected out, doubly clipped and divided.  The anterior branch of the  cystic artery is also dissected out, doubly clipped and divided.  The  gallbladder is then excised from the gallbladder bed using a hook  electrocautery for hemostasis. Gallbladder is placed into an EndoCatch bag  after being completely excised.  It contains multiple gallstones.  Right  upper quadrant is copiously irrigated with warm saline which is evacuated.  Good hemostasis is noted in the foreign body bed.  The EndoCatch bag  containing the gallbladder and stones is then retrieved through the  umbilical port without difficulty.  The 0 Vicryl pursestring suture is tied  securely.  Ports are removed under direct vision and good hemostasis is  noted at all port sites.  Pneumoperitoneum is released.  Port sites are  anesthetized with local anesthetic.  All wounds are closed with interrupted  4-0 Vicryl subcuticular sutures.  Wounds  are washed and dried and Benzoin  and Steri-Strips are applied.  Sterile gauze dressings are applied.  The  patient is awakened from anesthesia and brought to the recovery room in  stable condition.  The patient tolerated the procedure well.                                               Velora Heckler, M.D.    TMG/MEDQ  D:  08/12/2003  T:  08/12/2003  Job:  [id number]   cc:   Ulyess Mort, M.D. [facility]   Stacie Glaze, M.D. [facility]

## 2011-01-08 NOTE — Procedures (Signed)
[name], [name]          ACCOUNT NO.:  [id number]   MEDICAL RECORD NO.:  [id number]          PATIENT TYPE:  REC   LOCATION:  [facility]                          FACILITY:  [facility]   PHYSICIAN:  Erick Colace, M.D.DATE OF BIRTH:  [birth year]   DATE OF PROCEDURE:  DATE OF DISCHARGE:                                 OPERATIVE REPORT   PROCEDURE PERFORMED:  Bilateral L5-S1 facet injections under fluoroscopic  guidance.   INDICATIONS FOR PROCEDURE:  Lumbar pain only partially responsive to  medication management.   Informed consent was obtained after describing risks and benefits of the  procedure to the patient.  These include bleeding, bruising, infection, loss  of bowel and bladder function, temporary or permanent paralysis and she  elects to proceed and has given written consent.   Patient placed prone on fluoroscopy table.  Betadine prep and sterile drape.  A 25 gauge 1-1/2 inch needle was used to anesthetize skin and subcutaneous  tissue with 1% lidocaine x 2 mL.  Then a 22 gauge 3-1/2 inch spinal needle  was inserted under fluoroscopic guidance using a posterior approach  targeting the L5 at the junction of the L5 lamina with the sacrum at the  inferior medial joint recess.  Both Ap and lateral imaging utilized.  Omnipaque 180 demonstrated no intravascular uptake.  Then 1 mL of a solution  containing 1 mL of 40 mg per mL Kenalog plus 1 mL of 2% lidocaine.  Same  procedure was repeated exactly on the left side, same procedure, same  approach, equipment and injection.  The patient tolerated the procedure  well.  Post injection instructions given and I will see her back in  approximately one month.      Erick Colace, M.D.  Electronically Signed     AEK/MEDQ  D:  03/10/2006 16:55:10  T:  03/11/2006 07:25:20  Job:  [id number]

## 2011-01-08 NOTE — Procedures (Signed)
NAMEVERENIS, [name] NO.:  [id number]   MEDICAL RECORD NO.:  [id number]          PATIENT TYPE:  REC   LOCATION:  [facility]                          FACILITY:  [facility]   PHYSICIAN:  Erick Colace, M.D.DATE OF BIRTH:  11-07-1929   DATE OF PROCEDURE:  DATE OF DISCHARGE:                                 OPERATIVE REPORT   Tuesday, July 19, 2006   Acupuncture treatment for hand and foot pain, pelvic pain, and sinus pain.  Needles placed at the DU20, DU24.5, CV2, CV4.  Electrical stimulation to the  needles for 30 minutes as well as between GV34 and TH8 bilaterally.  Additional points placed at [facility] and Bafeng needle placed in the interspace  of toes and left Shenman and left thalamus points placed in the auricular  spots.  30 minute treatment time, the patient tolerated the procedure well,  return in 1-2 weeks.      Erick Colace, M.D.  Electronically Signed     AEK/MEDQ  D:  07/19/2006 14:24:26  T:  07/19/2006 16:47:17  Job:  [id number]

## 2011-01-08 NOTE — Procedures (Signed)
[name], [name]          ACCOUNT NO.:  [id number]   MEDICAL RECORD NO.:  [id number]          PATIENT TYPE:  REC   LOCATION:  [facility]                          FACILITY:  [facility]   PHYSICIAN:  Erick Colace, M.D.DATE OF BIRTH:  1930-01-21   DATE OF PROCEDURE:  06/06/2006  DATE OF DISCHARGE:                                 OPERATIVE REPORT   PROCEDURE:  Acupuncture treatment.   She has a history of thoracic spondylosis, paresthesias as well as  interstitial cystitis.   Last treatment done May 30, 2006.   A needle was placed at DU20 and DU24.5,GB 34 TH 8 LI 3 bilaterally with  Baefeng needles placed at webspace of the toes bilaterally.  In addition,  auricular points right thalamus and right shenmen were placed and CV2, CV4  electrical stimulation between all points except Baefeng and auricular  point.  The patient tolerated the procedure well.  I will see her back in 2  more weeks rather than 1 week.      Erick Colace, M.D.  Electronically Signed     AEK/MEDQ  D:  06/06/2006 15:40:20  T:  06/07/2006 15:17:36  Job:  [id number]   cc:   Rene Kocher, M.D.  Fax: [phone]

## 2011-01-08 NOTE — Procedures (Signed)
[name], [name] NO.:  [id number]   MEDICAL RECORD NO.:  [id number]          PATIENT TYPE:  REC   LOCATION:  [facility]                          FACILITY:  [facility]   PHYSICIAN:  Erick Colace, M.D.DATE OF BIRTH:  [birth year]   DATE OF PROCEDURE:  10/08/2005  DATE OF DISCHARGE:                                 OPERATIVE REPORT   Ms. Shor has some sinus blockage today.  She has some lower back and  buttock as well as foot and toe pain.  She showed me some of the stretches  that she did with integrative therapy, where she continues to go.   Bladder meridian treatments were not quite as helpful as the previous  treatments on September 24, 2005.   Pain score is 7/10 currently compared to 6/10 treatment at the previous  visit.   Treatment today consisted of bilateral LV3, bilateral TH8, bilateral GB34 as  well as DU20 and DU24.5 with electrical stimulation between DU20 and 24.5 at  2 Hz and between bilateral TH8 and LV3 at 4 Hz alternating with 20 Hz.  30  minutes of treatment.  The patient tolerated the procedure well.  We will  see her next week.  She is to continue on Darvocet, Lyrica and Soma for  pain.      Erick Colace, M.D.  Electronically Signed     AEK/MEDQ  D:  10/08/2005 10:38:34  T:  10/08/2005 12:07:13  Job:  [id number]

## 2011-01-08 NOTE — Procedures (Signed)
NAMEMARYTZA, GRANDPRE NO.:  [id number]   MEDICAL RECORD NO.:  [id number]          PATIENT TYPE:  REC   LOCATION:  [facility]                          FACILITY:  [facility]   PHYSICIAN:  Erick Colace, M.D.DATE OF BIRTH:  1929/11/08   DATE OF PROCEDURE:  08/11/2006  DATE OF DISCHARGE:                               OPERATIVE REPORT   August 11, 2006:   Ms. Whitehurst returns today. She is here for acupuncture treatment. Last  treatment done July 19, 2006. Interval history has been at [facility] in [place]. Had bilateral L4-5, L5-S1 facet thermal  ablation. She had diskography as well as nerve conduction studies and  laser decompression of left L4-5.   Acupuncture treatment today focused on her complaints of pelvic pain and  interstitial neuritis. Needles placed at CV2, CV4. In addition, she had  GB34, TH8 and LR3 as well as DU20, DU24.5. Electrical stimulation  between the points as well as placement of left shenmen in left thalamus  points in the auricular area. Treatment time 30 minutes. The patient  tolerated the procedure well. Will return in 2 weeks.      Erick Colace, M.D.  Electronically Signed     AEK/MEDQ  D:  09/16/2006 17:21:38  T:  09/17/2006 00:46:58  Job:  [id number]

## 2011-01-08 NOTE — Procedures (Signed)
NAMETAIJA, MATHIAS          ACCOUNT NO.:  [id number]   MEDICAL RECORD NO.:  [id number]          PATIENT TYPE:  REC   LOCATION:  [facility]                          FACILITY:  [facility]   PHYSICIAN:  Erick Colace, M.D.DATE OF BIRTH:  [birth year]   DATE OF PROCEDURE:  DATE OF DISCHARGE:                                 OPERATIVE REPORT   INDICATIONS:  Chronic hand and foot pain, cervical spondylosis, lumbar  spondylosis, myofascial pain syndrome, and interstitial cystitis.   Last treatment performed November 18, 2005.  Treatment today consists of  bilateral LR3, GB34, bilateral TH8 with electrical stimulation between TH8  and DB34 at 4 hertz.  Right thalamus needle was placed as well as points  placed at DU20 and DU24.5 with 4 hertz stem.  In addition, CV2 and CV4 were  inserted and electrical stimulated at 4 hertz.  Treatment time 30 minutes.  Return in two weeks.      Erick Colace, M.D.  Electronically Signed     AEK/MEDQ  D:  12/16/2005 16:37:13  T:  12/17/2005 11:33:08  Job:  [id number]
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
## 2011-01-08 NOTE — Procedures (Signed)
[name], [name]          ACCOUNT NO.:  [id number]   MEDICAL RECORD NO.:  [id number]          PATIENT TYPE:  REC   LOCATION:  TPC                          FACILITY:  [facility]   PHYSICIAN:  Erick Colace, M.D.DATE OF BIRTH:  Oct 30, 1929   DATE OF PROCEDURE:  07/07/2006  DATE OF DISCHARGE:                                 OPERATIVE REPORT   PROCEDURE:  Acupuncture treatment for foot greater than hand pain, as well  as pelvic pain due to interstitial cystitis and sinus pain.   Needles placed at DU-20, DU-24.5, CV-2, and CV-4 with electrical stimulation  between the needles, 4 Hz x30 minutes.  In addition point ST-2 bilaterally,  GB-14 bilaterally, GB-34 bilaterally, LI-3 bilaterally, TH-8 bilaterally  with electrical stimulation between LI-3 and GB-34.  In addition Ba Feng  needles placed in interspaces of the toes and in addition, right Shen-Men  and right thalamus.  30 minutes treatment time.  The patient tolerated the  procedure well.      Erick Colace, M.D.  Electronically Signed     AEK/MEDQ  D:  07/07/2006 11:36:53  T:  07/07/2006 12:52:00  Job:  [id number]

## 2011-01-08 NOTE — Procedures (Signed)
[name], [name]          ACCOUNT NO.:  [id number]   MEDICAL RECORD NO.:  [id number]          PATIENT TYPE:  REC   LOCATION:  [facility]                          FACILITY:  [facility]   PHYSICIAN:  Erick Colace, M.D.DATE OF BIRTH:  October 21, 1929   DATE OF PROCEDURE:  11/18/2005  DATE OF DISCHARGE:                                 OPERATIVE REPORT   PROCEDURE:  Acupuncture treatment #9.   Last treatment done November 12, 2005.  She has done well in terms of her  symptoms involving chronic interstitial cystitis as well as her hand and  foot pain.  Her sinus headache pain has improved as well.  She feels like  the duration of the acupuncture effect for her has been somewhere a little  over a week but not quite at two weeks thus far.   Treatment today consists of bilateral LR3, GB34, and bilateral TH8, with the  electrical stimulation between TH8 and GB34 at 4 Hz.  In addition, left  thalamus was placed after using Acupoint finder, then point DU20 and DU24.5  was utilized with 4 Hz stimulation and CB2 and CV4 with electrical  stimulation at 4 Hz, 30 minutes.  The patient will return in approximately  10 days for repeat visit to see if we could stretch out treatments at every  two weeks if possible.      Erick Colace, M.D.  Electronically Signed     AEK/MEDQ  D:  11/18/2005 13:19:25  T:  11/20/2005 06:30:23  Job:  [id number]

## 2011-01-08 NOTE — Procedures (Signed)
[name], [name] NO.:  [id number]   MEDICAL RECORD NO.:  [id number]          PATIENT TYPE:  REC   LOCATION:  [facility]                          FACILITY:  [facility]   PHYSICIAN:  Erick Colace, M.D.DATE OF BIRTH:  Mar 17, 1930   DATE OF PROCEDURE:  12/26/2006  DATE OF DISCHARGE:                               OPERATIVE REPORT   DATE OF LAST TREATMENT:  December 11, 2006.   INDICATIONS:  1. Back pain due to lumbar compression fracture.  2. History of interstitial cystitis.  3. Anxiety.  4. Lower extremity neuropathic pain.   Treatment today consists of DU-20, DU-24.5 left Shen-Men and left  thalamus auricular points.  Also bilateral GB-34, bilateral TH-8,  bilateral LV-3, in addition Ba Feng  points in the web spaces of the  toes were utilized.  E-stim between left bilateral TH-8 and bilateral GB-  34 as well as between DU-20, DU-24.5, as well as between CV-2 and CV-4,  electrical stimulation 4 hertz x30 minutes.  The patient tolerated  procedure well, return in 10 days.  She is down to about 2 oxycodone per  day, consider Ultram next visit.      Erick Colace, M.D.  Electronically Signed     AEK/MEDQ  D:  12/26/2006 14:34:13  T:  12/26/2006 18:22:02  Job:  [id number]

## 2011-01-08 NOTE — Procedures (Signed)
NAMEMARCHELL, FROMAN NO.:  [id number]   MEDICAL RECORD NO.:  [id number]          PATIENT TYPE:  REC   LOCATION:  [facility]                          FACILITY:  [facility]   PHYSICIAN:  Erick Colace, M.D.DATE OF BIRTH:  09/07/1929   DATE OF PROCEDURE:  10/14/2005  DATE OF DISCHARGE:                                 OPERATIVE REPORT   MEDICAL RECORD NUMBER:  [id number].   The patient returns today for acupuncture treatment, primarily for lower  extremity pain. Also depression and sinus problems.   Treatment to date consists of bilateral LB3, bilateral TH8, bilateral GB34,  DU20, DU24.5 and CD2. Electrical stimulation between DU20 and DU24.5 at 2  hertz and between bilateral TH8 and GB34 at 4 hertz, alternating 20 hertz,  30 minutes treatment. The patient tolerated the procedure well. Return in  one week.      Erick Colace, M.D.  Electronically Signed     AEK/MEDQ  D:  10/14/2005 17:18:28  T:  10/15/2005 11:43:24  Job:  [id number]

## 2011-01-08 NOTE — Procedures (Signed)
NAMEJADEA, SHIFFER          ACCOUNT NO.:  [id number]   MEDICAL RECORD NO.:  [id number]          PATIENT TYPE:  REC   LOCATION:  [facility]                          FACILITY:  [facility]   PHYSICIAN:  Erick Colace, M.D.DATE OF BIRTH:  [birth year]   DATE OF PROCEDURE:  DATE OF DISCHARGE:                                 OPERATIVE REPORT   PROCEDURE:  Acupuncture.   TREATMENT INDICATIONS:  Lower extremity pain, as well as urinary frequency  due to interstitial cystitis, as well as anxiety and depression.   Treatment today consisted of DU24.5 electrical stimulation 4 Hz, 3 minutes,  bilateral TH8 bilateral GB34 electrical stimulation 4 Hz, 3 minutes,  bilateral LR3, bilateral foot Ba Feng points, as well as left thalamic  auricular point.  Patient tolerated procedure well.  Return in one week.  Will focus more on back pain using bladder points in the paraspinal lumbar  area.      Erick Colace, M.D.  Electronically Signed     AEK/MEDQ  D:  04/04/2006 12:42:57  T:  04/04/2006 22:00:49  Job:  [id number]

## 2011-01-08 NOTE — Procedures (Signed)
NAMENELY, DEDMON NO.:  [id number]   MEDICAL RECORD NO.:  [id number]          PATIENT TYPE:  EMS   LOCATION:  [facility]                         FACILITY:  [facility]   PHYSICIAN:  Erick Colace, M.D.DATE OF BIRTH:  1929/09/28   DATE OF PROCEDURE:  DATE OF DISCHARGE:  10/23/2006                               OPERATIVE REPORT   This is an acupuncture treatment for chronic back pain, pelvic pain due  to interstitial cystitis treatment and sinus pain.   Treatment today consists of DU-20, DU-24.5.  Left Shen-Men and left  thalamus as well as bilateral KI3, SP6 and BL60.  Electrical stimulation  between points, 4 Hz x30 minutes.   Patient tolerated the procedure well.  Post-procedure instructions  given.  Will see her back in one week and then we may be able to spread  out treatments again to two weeks.      Erick Colace, M.D.  Electronically Signed     AEK/MEDQ  D:  11/18/2006 16:26:03  T:  11/18/2006 21:43:57  Job:  [id number]

## 2011-01-08 NOTE — Procedures (Signed)
[name], [name]          ACCOUNT NO.:  [id number]   MEDICAL RECORD NO.:  [id number]          PATIENT TYPE:  REC   LOCATION:  [facility]                          FACILITY:  [facility]   PHYSICIAN:  Erick Colace, M.D.DATE OF BIRTH:  [birth year]   DATE OF PROCEDURE:  05/24/2006  DATE OF DISCHARGE:                                 OPERATIVE REPORT   PROCEDURE:  Acupuncture treatment.   Last treatment performed May 16, 2006.   Needles placed a DU20, DU24.5, bilateral TH8, bilateral GB34, bilateral LR3,  with baifeng needles placed at the web space of the toes bilaterally.  In  addition, right thalamus auricular point placed and needles placed at CB2  and CB4.  Electrical stimulation between all points except baifeng and  auricular point.  The patient tolerated the procedure well.  If she is doing  well, we will see her back in 2 weeks.      Erick Colace, M.D.  Electronically Signed     AEK/MEDQ  D:  05/24/2006 13:57:16  T:  05/25/2006 15:23:26  Job:  [id number]

## 2011-01-08 NOTE — Assessment & Plan Note (Signed)
MEDICAL RECORD NUMBER:  [id number]   Acupuncture treatment today.  Last treatment approximately 3 weeks ago.  She  complains of back pain, hand and foot pain, as well as urinary urgency.   Needles placed at DU20, DU24.5, as well as CV2, CV4, LR3, GB34, and TH8.  Electrical stimulation between points.  Auricular needle placed at left  thalamus point.  Four Hertz stimulation up to 30 minutes.  The patient  tolerated the procedure well.      Erick Colace, M.D.  Electronically Signed     AEK/MedQ  D:  03/29/2006 13:31:50  T:  03/29/2006 14:41:34  Job #:  [id number]   cc:   Dr. Duffy Bruce   Dr. Ethelene Hal

## 2011-01-08 NOTE — Procedures (Signed)
NAMEALOURA, [name] NO.:  [id number]   MEDICAL RECORD NO.:  [id number]          PATIENT TYPE:  REC   LOCATION:  [facility]                          FACILITY:  [facility]   PHYSICIAN:  Erick Colace, M.D.DATE OF BIRTH:  February 10, 1930   DATE OF PROCEDURE:  DATE OF DISCHARGE:                                 OPERATIVE REPORT   MEDICAL RECORD NUMBER:  [id number]   DATE OF BIRTH:  1930/01/30   PROCEDURE:  Acupuncture treatment.   ATTENDING:  Erick Colace, M.D.   INDICATION:  Complaints of bladder pain, complaints with headache and sinus  pain, last treatment performed October 29, 2005.   Acupuncture treatment today consisted of bilateral LR-3, bilateral GB-34,  bilateral TH-8 with electrical stimulation between TH-8 and TB-34, 4 Hz.  In  addition, right thalamic tack was placed and left thalamus tack removed with  alcohol swab prep and point DU-20 and DU-24.5 utilized with 4-Hz stimulation  and points CV-2 and CV-4 with electrical stimulation, 4 Hz, treatment x30  minutes.   The patient is to return next week.      Erick Colace, M.D.  Electronically Signed     AEK/MEDQ  D:  11/12/2005 10:22:17  T:  11/15/2005 07:40:32  Job:  [id number]
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
## 2011-01-08 NOTE — Procedures (Signed)
NAMEAPURVA, [name]          ACCOUNT NO.:  [id number]   MEDICAL RECORD NO.:  [id number]          PATIENT TYPE:  REC   LOCATION:  [facility]                          FACILITY:  [facility]   PHYSICIAN:  Erick Colace, M.D.DATE OF BIRTH:  1930/01/24   DATE OF PROCEDURE:  10/18/2006  DATE OF DISCHARGE:                               OPERATIVE REPORT   Treatment for chronic sinusitis, interstitial cystitis and diffuse  muscle pain.  Treatment today consisted of LR3, GB34, TH8 bilaterally as  well as DU20, DU24.4, DL2 and CD14 bilaterally.  Electrostimulation  between DU20, DU24.5, CV2, CV4 and TH8, GB34.  Treatment time 30  minutes.  The patient tolerated the procedure well.  Postprocedure  instructions given.  Return in two weeks.      Erick Colace, M.D.  Electronically Signed     AEK/MEDQ  D:  10/18/2006 09:19:40  T:  10/18/2006 11:02:49  Job:  [id number]

## 2011-01-08 NOTE — Procedures (Signed)
NAMEDAWNYA, GRAMS NO.:  [id number]   MEDICAL RECORD NO.:  [id number]          PATIENT TYPE:  REC   LOCATION:  [facility]                          FACILITY:  [facility]   PHYSICIAN:  Erick Colace, M.D.DATE OF BIRTH:  [birth year]   DATE OF PROCEDURE:  06/20/2006  DATE OF DISCHARGE:                                 OPERATIVE REPORT   MEDICAL RECORD NUMBER:  [id number]   DATE OF BIRTH:  09/03/1929.   LAST TREATMENT DONE:  June 06, 2006.   DESCRIPTION OF PROCEDURE:  A needle was placed at the 20 DU, 24.5, GB 34, TH  8, Li3bilaterally.  Bafeng needle was placed in the web spaces of the toes  bilaterally.  In addition, left thalamus and left Shenmen placed and CV2 and  CV-4  electrical stimulation between all points except for Bafeng needles in  auricular points.   I will see her back in approximately 10 days.      Erick Colace, M.D.  Electronically Signed     AEK/MEDQ  D:  06/20/2006 13:37:48  T:  06/21/2006 08:07:05  Job:  [id number]   cc:   Rene Kocher, M.D.  Fax: [phone]

## 2011-01-08 NOTE — Procedures (Signed)
[name], [name] NO.:  [id number]   MEDICAL RECORD NO.:  [id number]          PATIENT TYPE:  REC   LOCATION:  [facility]                          FACILITY:  [facility]   PHYSICIAN:  Erick Colace, M.D.DATE OF BIRTH:  1930-01-02   DATE OF PROCEDURE:  DATE OF DISCHARGE:                                 OPERATIVE REPORT   Acupuncture treatment last performed April 26, 2006. Needle was placed at  DU20, DU24.5, bilateral TH8, bilateral GB34. bilateral bai  fang needles in  the web space of the toes bilaterally and auricular point left thalamus,  electrical stim between TH8 and GB34 as well as between DU20 and DU24.5 and  needles placed at CV2 and CV4. The patient tolerated the procedure well.      Erick Colace, M.D.  Electronically Signed     AEK/MEDQ  D:  05/09/2006 14:18:27  T:  05/10/2006 10:53:33  Job:  [id number]

## 2011-01-08 NOTE — Procedures (Signed)
NAMEALIEA, BOBE NO.:  [id number]   MEDICAL RECORD NO.:  [id number]          PATIENT TYPE:  REC   LOCATION:  [facility]                          FACILITY:  [facility]   PHYSICIAN:  Erick Colace, M.D.DATE OF BIRTH:  [birth year]   DATE OF PROCEDURE:  12/31/2005  DATE OF DISCHARGE:                                 OPERATIVE REPORT   PROCEDURE:  Acupuncture treatment for foot pain, pelvic pain associated with  stress anxiety.   Treatment #12.  The patient placed supine on the exam table.  Bilateral LR  2, ST 34, GB 43, BL 67 placed.  In addition, DU 20 and DU 24.5 placed as  well as CV 2 and CV 4, electrical stimulation between CB 2 and CB 4 and DU  20 and DU 24.5 x30 minutes.  The patient tolerated the procedure well.  Postprocedure instructions given.  She will call back to schedule another  appointment in about 2 weeks.      Erick Colace, M.D.  Electronically Signed     AEK/MEDQ  D:  12/31/2005 13:37:54  T:  01/01/2006 15:37:09  Job:  [id number]

## 2011-01-08 NOTE — Procedures (Signed)
[name], [name] NO.:  [id number]   MEDICAL RECORD NO.:  [id number]           PATIENT TYPE:   LOCATION:                               FACILITY:  [facility]   PHYSICIAN:  Erick Colace, M.D.DATE OF BIRTH:  19-May-1930   DATE OF PROCEDURE:  DATE OF DISCHARGE:                                 OPERATIVE REPORT   PROCEDURE PERFORMED:  Acupuncture treatment for neuropathic pain.   DIAGNOSES:  1.  Interstitial cystitis.  2.  Anxiety.  3.  Stress.   PHYSICIAN:  Erick Colace, M.D.   DESCRIPTION OF THE PROCEDURE:  Needle was placed at DU20, DU24.5 as well as  CV2, CV4 electrical stimulation between the points.  In addition bilateral  TH8, bilateral LR3 and GB34 plates with electrical stimulation between TH8  and GB34,  Auricular needle was placed in the left thalamus point.  Bafeng  needle was placed at the web spaces between the toes.   The patient tolerated the procedure well.   Four hz stimulation times 30 minutes.   The patient feels like her back pain has been gradually increasing.  She had  her last lumbar facet injection in 2006.  I think it is reasonable to repeat  this and we will make arrangements.      Erick Colace, M.D.  Electronically Signed     AEK/MEDQ  D:  02/10/2006 17:04:17  T:  02/11/2006 01:02:52  Job:  [id number]   cc:   Rene Kocher, M.D.  Fax: [phone]

## 2011-01-08 NOTE — Procedures (Signed)
NAMELEONIA, HEATHERLY          ACCOUNT NO.:  [id number]   MEDICAL RECORD NO.:  [id number]          PATIENT TYPE:  REC   LOCATION:  [facility]                          FACILITY:  [facility]   PHYSICIAN:  Erick Colace, M.D.DATE OF BIRTH:  24-Feb-1930   DATE OF PROCEDURE:  11/07/2006  DATE OF DISCHARGE:                               OPERATIVE REPORT   PROCEDURE:  Acupuncture treatment.   Treatment today consisting of bilateral BL21 and BL27, left BL23, left  BL25, in addition to left BL53 and left BL54, left GB26 and left GB30,  along with left shen-men in the left thalamus auricular point.  Electrical stimulation between the points except for auricular points  with six needle pairs at 4 hertz x30 minutes and 4 needle pairs at 20  hertz.  The patient tolerated the procedure well.      Erick Colace, M.D.  Electronically Signed     AEK/MEDQ  D:  11/07/2006 17:12:38  T:  11/08/2006 11:27:58  Job:  [id number]

## 2011-01-08 NOTE — Procedures (Signed)
[name], [name] NO.:  [id number]   MEDICAL RECORD NO.:  [id number]          PATIENT TYPE:  REC   LOCATION:  [facility]                          FACILITY:  [facility]   PHYSICIAN:  Erick Colace, M.D.DATE OF BIRTH:  07-08-1930   DATE OF PROCEDURE:  DATE OF DISCHARGE:                                 OPERATIVE REPORT   Acupuncture treatment.  Last treatment done Jan 14, 2006.  Complains of pain  in the feet and hands as well as anxiety, stress and interstitial cystitis.   Needles placed at DU20, DU24.5 as well as CV2 and CV4.  E-stim between these  points.  In addition bilateral TH8 as well as bilateral LR3 and GB34 were  placed.  Electrical stimulation between [facility] and CV34.  The patient tolerated  the procedure well.   ADDENDUM:  Wrote her prescription for Lidoderm patch up to three patches a  day, three-month supply with refills.  She had benefits from samples given  to her last visit, placing them on her feet as well as on her back.      Erick Colace, M.D.  Electronically Signed     AEK/MEDQ  D:  01/20/2006 17:02:03  T:  01/21/2006 07:56:58  Job:  [id number]   cc:   Rene Kocher, M.D.  Fax: [phone]

## 2011-01-08 NOTE — Procedures (Signed)
NAMEALANTRA, [name] NO.:  [id number]   MEDICAL RECORD NO.:  [id number]          PATIENT TYPE:  REC   LOCATION:  [facility]                          FACILITY:  [facility]   PHYSICIAN:  Erick Colace, M.D.DATE OF BIRTH:  Apr 15, 1930   DATE OF PROCEDURE:  11/10/2006  DATE OF DISCHARGE:                               OPERATIVE REPORT   ACUPUNCTURE TREATMENT.   Bilateral BL21, BL23, BL25, BL27, right shen-men and left thalamus.  In  addition, DU20, DU24.5, 4 hertz stim 30 minutes.  The patient tolerated  the procedure well.      Erick Colace, M.D.  Electronically Signed     AEK/MEDQ  D:  11/10/2006 14:47:56  T:  11/10/2006 15:23:19  Job:  [id number]

## 2011-01-08 NOTE — Procedures (Signed)
NAMEJENNFER, GASSEN NO.:  [id number]   MEDICAL RECORD NO.:  [id number]          PATIENT TYPE:  REC   LOCATION:  [facility]                          FACILITY:  [facility]   PHYSICIAN:  Erick Colace, M.D.DATE OF BIRTH:  May 08, 1930   DATE OF PROCEDURE:  04/18/2006  DATE OF DISCHARGE:                                 OPERATIVE REPORT   Ms. Nathanson is complaining of urinary frequency which has been improved by  acupuncture in the past and does not have any burning pain, no fevers. She  also has some knee pain and lower extremity pain, buttock pain. She states  that her mood is depressed, irritable.   Treatment today consisted of acupuncture needles placed at DU20, DU24.5,  bilateral TH8, LR3 and GB34. Also at CB2 and CB4 and an auricular point  placed at the right thalamus. Also Bafeng needles placed bilaterally into  web spaces for her stimulation x30 minutes. The patient tolerated the  procedure well.      Erick Colace, M.D.  Electronically Signed     AEK/MEDQ  D:  04/18/2006 13:45:19  T:  04/19/2006 08:51:25  Job:  [id number]

## 2011-01-08 NOTE — Procedures (Signed)
NAME:  [name], [name]          ACCOUNT NO.:  [id number]   MEDICAL RECORD NO.:  [id number]          PATIENT TYPE:  REC   LOCATION:  [facility]                          FACILITY:  [facility]   PHYSICIAN:  Erick Colace, M.D.DATE OF BIRTH:  Feb 05, 1930   DATE OF PROCEDURE:  06/28/2006  DATE OF DISCHARGE:                                 OPERATIVE REPORT   The patient is complaining of pain in the feet and toes about 9 with  activity, legs 8 with activity, buttocks 8 which is more consistent, lumbar  7 without activity and 9 with, cervical 8 with activity, and shoulders 8  with activity, and hands and fingers 3 when weather is good and 7 when it is  bad.   She also has some urinary frequency related to her interstitial cystitis.   PROCEDURE:  Needle was placed at DU-20, DU-24.5, as well as LI-3, GB-34, TH-  8.  Bilaterally electrical stimulation between GB-34 and LI-3. In addition  Ba Feng needle was placed in the web spaces between the toes bilaterally.  In addition right thalamus and right Shen-Men needle was placed as well as  CV-2 and CV-4, electrical stimulation between all points except for Ba Feng  needles and auricular needles as well as TH-8.  I will see her back in 10  days.      Erick Colace, M.D.  Electronically Signed     AEK/MEDQ  D:  06/28/2006 13:35:58  T:  06/29/2006 00:31:31  Job:  [id number]   cc:   Rene Kocher, M.D.  Fax: [phone]

## 2011-01-08 NOTE — Group Therapy Note (Signed)
MEDICAL RECORD NUMBER:  [id number]   DATE OF BIRTH:  [birth year]   REASON FOR REFERRAL:  Consultation for acupuncture.   HISTORY:  The patient is a 77 year old female who has neck pain and back  pain. She also has all-over body pain. She has seen Dr. Ethelene Hal in the past in  2003. She had spondylosis C5-6, C6-7, some degenerative disc at L3-4 at that  time. She has had cervical and lumbar epidural steroid injections which she  had December 11, 2004. Neck pain responded better than her back pain. She was  tried on Lyrica. She was concerned about potential side effects. She has had  an upright cervical and lumbar MRI in September 2006 showing some  degenerative changes, primarily C5-6, C6-7, but without high-grade central  canal stenosis. She had L2-3, L3-4 degenerative discs. She is undergoing  therapy at [facility]. She has seen Dr. Lestine Box for probable  bilateral peripheral neuropathy, custom foot orthoses. Her EMG just shows  some chronic L5 radiculopathy, mostly paraphasic potentials, with no fibs or  positives in the right lower extremity. Her right median palmar mixed nerve  showed abnormality but median sensory normal. Her subjective symptoms are  bilateral hands and feet, describing pins and needles sensation.   FUNCTIONAL STATUS:  She can walk 30 minutes, she climbs steps, she drives.  Needs some assistance with household duties and shopping.   REVIEW OF SYSTEMS:  Please see 14-point review. Mild bladder control  problems, trouble walking, spasms, depression, anxiety.   OTHER PHYSICIANS:  Include Dr. Darryll Capers, primary care; Dr. Danne Harbor,  physiatry; Dr. Donell Beers from psychiatry; Dr. Clarisse Gouge from neurology.   PAST SURGICAL HISTORY:  Hysterectomy in 1976. Rotator cuff surgery in 2001.  Breast cancer surgery in 1981. Gallbladder surgery.   She is married. No alcohol or tobacco use.   Blood pressure 168/78, pulse 93, respiratory rate 16, O2 saturation 96% in  room  air.   In general, no acute distress, mood and affect bright, alert. Gait is  normal. She had normal strength bilateral upper and lower extremities,  normal range of motion bilateral upper and lower extremities. She has  tenderness to palpation bilateral elbows and upper trapezius as well as at  the knee bilaterally, but no other tender point involvement. She has some  tenderness over LV3 and GB34 acupuncture points in addition to above.   Her sensation is normal in upper and lower extremities.   IMPRESSION:  1.  Cervical spondylosis, degenerative disc disease.  2.  Lumbar spondylosis, degenerative disc disease.  3.  Myofascial pain syndrome, does not meet criteria for fibromyalgia.  4.  History of depression.  5.  History of anxiety. She is on both Valium and Soma from her      psychiatrist, as well as Restoril.   PLAN:  Will start acupuncture treatment. I will see her back weekly to get  her reestablished. She has had success with acupuncture treatment but her  last treatment was about a year-and-a-half ago.   Treatment today consisted of points LV3, TH5, and GB34 bilaterally, as well  as DU20 and DU24.5. Electrical stimulation between D20 and D24.5 as well as  between bilateral LV3 and GB34. The patient tolerated the procedure well.  Treatment time 30 minutes at 4 Hz.      Erick Colace, M.D.  Electronically Signed     AEK/MedQ  D:  09/17/2005 13:11:57  T:  09/17/2005 13:54:16  Job #:  [id number]   cc:  Rene Kocher, M.D.  Fax: [phone]

## 2011-01-08 NOTE — Procedures (Signed)
[name], [name]          ACCOUNT NO.:  [id number]   MEDICAL RECORD NO.:  [id number]          PATIENT TYPE:  REC   LOCATION:  [facility]                          FACILITY:  [facility]   PHYSICIAN:  Erick Colace, M.D.DATE OF BIRTH:  [birth year]   DATE OF PROCEDURE:  DATE OF DISCHARGE:                                 OPERATIVE REPORT   REASON FOR VISIT:  Urinary frequency due to interstitial cystitis, hand and  foot pain, anxiety.   TREATMENT:  Acupuncture needles placed at CV2, CV4, DU20, DU24.5 as well as  bilateral BL23 and the DU four-point electrical stimulation between points  with the exception of DU4.  Patient tolerated the procedure well.  Thirty  minute treatment time at 4 Hz.  See her back in a week.      Erick Colace, M.D.  Electronically Signed     AEK/MEDQ  D:  04/07/2006 15:24:32  T:  04/07/2006 21:06:07  Job:  [id number]

## 2011-01-08 NOTE — Procedures (Signed)
NAMEPALMER, SHOREY          ACCOUNT NO.:  [id number]   MEDICAL RECORD NO.:  [id number]          PATIENT TYPE:  REC   LOCATION:  [facility]                          FACILITY:  [facility]   PHYSICIAN:  Erick Colace, M.D.DATE OF BIRTH:  [birth year]   DATE OF PROCEDURE:  DATE OF DISCHARGE:                                 OPERATIVE REPORT   PROCEDURE:  Acupuncture.   The patient complains of pain in the feet.  She has anxiety, depression, as  well as urinary frequency.   Needles placed at DU20, DU24.5, bilateral THA, bilateral GB34, as well as  Bafeng needles placed in the web spaces at toes bilaterally as well as  auricular point placed at left thalamus.   The patient tolerated the procedure well.  Post-procedure instructions  given.  Reschedule in one week repeat treatment.      Erick Colace, M.D.  Electronically Signed     AEK/MEDQ  D:  04/26/2006 10:07:43  T:  04/26/2006 10:28:14  Job:  [id number]

## 2011-01-08 NOTE — Procedures (Signed)
[name], [name]          ACCOUNT NO.:  [id number]   MEDICAL RECORD NO.:  [id number]          PATIENT TYPE:  REC   LOCATION:  [facility]                          FACILITY:  [facility]   PHYSICIAN:  Erick Colace, M.D.DATE OF BIRTH:  May 05, 1930   DATE OF PROCEDURE:  09/24/2005  DATE OF DISCHARGE:                                 OPERATIVE REPORT   DIAGNOSES:  1.  Cervical spondylosis and degenerative disk disease.  2.  Lumbar spondylosis and degenerative disk disease.  3.  Myofascial pain syndrome.   Pain level averaging 9/10.  Pain interference score:  General activity 7,  relationship with other people 2, enjoyment of life 9.   Previous acupuncture treatment done September 17, 2005, resulted in drowsiness  but when she got up, she had improvement in her pain.   Treatment today consisted today of bilateral LV3, bilateral TH8, bilateral  GB34, as well as DU20 and DU24.5 with electrical stimulation between DU20  and DU24.5 as well as between bilateral TH8 and GB34.  The patient tolerated  the procedure well.  Acupuncture treatment time 30 minutes at 4 Hz.  Return  next week, at which time we will have the patient in prone position and  concentrate more on bladder meridian, paraspinal and posterior thigh and  leg.      Erick Colace, M.D.  Electronically Signed     AEK/MEDQ  D:  09/24/2005 15:40:23  T:  09/25/2005 40:98:11  Job:  [id number]   cc:   Rene Kocher, M.D.  Fax: [phone]

## 2011-01-08 NOTE — Procedures (Signed)
[name], PLANTZ          ACCOUNT NO.:  [id number]   MEDICAL RECORD NO.:  [id number]          PATIENT TYPE:  REC   LOCATION:  [facility]                          FACILITY:  [facility]   PHYSICIAN:  Erick Colace, M.D.DATE OF BIRTH:  Jul 05, 1930   DATE OF PROCEDURE:  12/02/2005  DATE OF DISCHARGE:                                 OPERATIVE REPORT   PROCEDURE:  Acupuncture treatment for neuropathic pain, lower greater than  upper extremities, as well as pelvic pain with history of interstitial  cystitis.   Treatment today consisted of points LR3, GB34, TH8, electrical stimulation  between TH8 and GB34 at 4 Hz.  Left thalamic auricular tack placed.  Point  DU20 and DU24.5 utilized with 4 Hz stimulation between the points and points  CV2 and CV4 with electrical stimulation at 4 Hz.  Treatment time 30 minutes.  The patient will return in two weeks.  Pain level:  Pain interference score  is 5-7/10. The patient tolerated the procedure well.  Post procedure  instructions given.      Erick Colace, M.D.  Electronically Signed     AEK/MEDQ  D:  12/02/2005 13:30:20  T:  12/02/2005 14:43:51  Job:  [id number]

## 2011-01-08 NOTE — Assessment & Plan Note (Signed)
This is acupuncture treatment for back pain.  Has had a recent lumbar  compression fracture, which was graded as mild.  She was evaluated in  the emergency department at [facility] per her report and this has been  supported by her husband.   Treatment today consists of bilateral BL21, BL23, BL25, BL27 electrical  stimulation for 30 minutes.  The patient tolerated this well.      Erick Colace, M.D.  Electronically Signed     AEK/MedQ  D:  10/28/2006 16:42:41  T:  10/29/2006 08:06:50  Job #:  [id number]

## 2011-01-08 NOTE — Procedures (Signed)
NAMEJAKIRA, MCFADDEN NO.:  [id number]   MEDICAL RECORD NO.:  [id number]          PATIENT TYPE:  REC   LOCATION:  [facility]                          FACILITY:  [facility]   PHYSICIAN:  Erick Colace, M.D.DATE OF BIRTH:  August 12, 1930   DATE OF PROCEDURE:  01/05/2007  DATE OF DISCHARGE:                               OPERATIVE REPORT   Ms. Marte returns for acupuncture treatment today for back pain due  to lumbar compression fracture, history of interstitial cystitis,  anxiety and lower extremity neuropathic pain.   Treatment today consisted of the DU20, DU24.5 right sen-men and left and  right thalamus auricular points, also bilateral GB34, bilateral TH8,  bilateral LV3 and in addition va-feng points in the web space of the  toes.  E-stim between bilateral GB34 as well as between DU20, DU24.5 and  between CB2 and CB4 hz 30 minutes.  She is taking one to two oxycodone  per week.  She had some left over Darvocet that she has used as well.   Still takes Tresa Garter but not daily.   I will see her back in 10 days.  Repeat treatment.   Overall she is progressing and has reduced her medication usage quite  dramatically over the last 1-1/2 months.      Erick Colace, M.D.     AEK/MEDQ  D:  01/05/2007 14:37:25  T:  01/05/2007 15:12:52  Job:  [id number]

## 2011-01-08 NOTE — Procedures (Signed)
[name], ANDREW          ACCOUNT NO.:  [id number]   MEDICAL RECORD NO.:  [id number]          PATIENT TYPE:  REC   LOCATION:  [facility]                          FACILITY:  [facility]   PHYSICIAN:  Erick Colace, M.D.DATE OF BIRTH:  26-Jan-1930   DATE OF PROCEDURE:  10/29/2005  DATE OF DISCHARGE:                                 OPERATIVE REPORT   PROCEDURE:  Acupuncture treatment #7.   She has no complaints of urination.  She has knee pain today, in the past  has had prolotherapy per Alinda Dooms, M.D.   Acupuncture treatment today is of bilateral ST9, bilateral GB34, and  bilateral superior eyes of the knee with electrical stimulation between the  sites.  In addition, she had electrical stimulation between bilateral LR3 as  well as between DU20 and DU24.5, had right thalamus-auricular tack removed  and placed on the left after prepping with alcohol swab.  Treatment time:  30 minutes.  The patient tolerated the procedure well.  I can see her back  in two weeks.      Erick Colace, M.D.  Electronically Signed     AEK/MEDQ  D:  10/29/2005 13:53:25  T:  10/31/2005 12:58:22  Job:  [id number]
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
## 2011-01-11 ENCOUNTER — Other Ambulatory Visit (INDEPENDENT_AMBULATORY_CARE_PROVIDER_SITE_OTHER): Payer: Self-pay | Admitting: Surgery

## 2011-01-21 ENCOUNTER — Ambulatory Visit (HOSPITAL_COMMUNITY): Payer: Medicare Other

## 2011-01-21 ENCOUNTER — Encounter (HOSPITAL_COMMUNITY)
Admission: RE | Admit: 2011-01-21 | Discharge: 2011-01-21 | Disposition: A | Discharge status: Home / Self Care | Payer: Medicare Other | Source: Ambulatory Visit | Attending: Surgery | Admitting: Surgery

## 2011-01-29 ENCOUNTER — Other Ambulatory Visit: Payer: Self-pay | Admitting: Neurology

## 2011-01-29 DIAGNOSIS — M549 Dorsalgia, unspecified: Secondary | ICD-10-CM

## 2011-02-04 ENCOUNTER — Ambulatory Visit
Admission: RE | Admit: 2011-02-04 | Discharge: 2011-02-04 | Disposition: A | Discharge status: Home / Self Care | Payer: Medicare Other | Source: Ambulatory Visit | Attending: Neurology | Admitting: Neurology

## 2011-02-04 DIAGNOSIS — M549 Dorsalgia, unspecified: Secondary | ICD-10-CM

## 2011-02-04 MED ORDER — GADOBENATE DIMEGLUMINE 529 MG/ML IV SOLN
16.0000 mL | Freq: Once | INTRAVENOUS | Status: AC | PRN
  Administered 2011-02-04: 16 mL via INTRAVENOUS

## 2011-02-05 ENCOUNTER — Encounter (HOSPITAL_COMMUNITY): Admission: RE | Admit: 2011-02-05 | Payer: Medicare Other | Source: Ambulatory Visit

## 2011-02-05 ENCOUNTER — Ambulatory Visit (HOSPITAL_COMMUNITY): Admission: RE | Admit: 2011-02-05 | Payer: Medicare Other | Source: Ambulatory Visit

## 2011-02-05 ENCOUNTER — Other Ambulatory Visit (INDEPENDENT_AMBULATORY_CARE_PROVIDER_SITE_OTHER): Payer: Self-pay | Admitting: Surgery

## 2011-02-05 ENCOUNTER — Ambulatory Visit (HOSPITAL_COMMUNITY): Payer: Medicare Other

## 2011-02-05 ENCOUNTER — Ambulatory Visit (HOSPITAL_COMMUNITY)
Admission: RE | Admit: 2011-02-05 | Discharge: 2011-02-05 | Disposition: A | Discharge status: Home / Self Care | Payer: Medicare Other | Source: Ambulatory Visit | Attending: Surgery | Admitting: Surgery

## 2011-02-05 DIAGNOSIS — E215 Disorder of parathyroid gland, unspecified: Secondary | ICD-10-CM | POA: Insufficient documentation

## 2011-02-05 MED ORDER — TECHNETIUM TC 99M SESTAMIBI - CARDIOLITE
20.0000 | Freq: Once | INTRAVENOUS | Status: AC | PRN
  Administered 2011-02-05: 10:00:00 25.8 via INTRAVENOUS

## 2011-02-15 ENCOUNTER — Telehealth (INDEPENDENT_AMBULATORY_CARE_PROVIDER_SITE_OTHER): Payer: Self-pay

## 2011-02-17 ENCOUNTER — Other Ambulatory Visit (INDEPENDENT_AMBULATORY_CARE_PROVIDER_SITE_OTHER): Payer: Self-pay | Admitting: Surgery

## 2011-02-17 ENCOUNTER — Telehealth (INDEPENDENT_AMBULATORY_CARE_PROVIDER_SITE_OTHER): Payer: Self-pay | Admitting: Surgery

## 2011-02-17 DIAGNOSIS — D497 Neoplasm of unspecified behavior of endocrine glands and other parts of nervous system: Secondary | ICD-10-CM

## 2011-02-17 MED ORDER — ERGOCALCIFEROL 50 MCG (2000 UT) PO TABS: 2000.0000 [IU] | ORAL_TABLET | Freq: Every day | ORAL | Status: DC

## 2011-02-17 NOTE — Telephone Encounter (Signed)
Already addressed

## 2011-02-17 NOTE — Telephone Encounter (Signed)
Spoke with patient, e-prescribe med, given 90 day supply.

## 2011-02-18 ENCOUNTER — Other Ambulatory Visit: Payer: Medicare Other

## 2011-02-19 ENCOUNTER — Ambulatory Visit
Admission: RE | Admit: 2011-02-19 | Discharge: 2011-02-19 | Disposition: A | Discharge status: Home / Self Care | Payer: Medicare Other | Source: Ambulatory Visit | Attending: Surgery | Admitting: Surgery

## 2011-02-19 DIAGNOSIS — D497 Neoplasm of unspecified behavior of endocrine glands and other parts of nervous system: Secondary | ICD-10-CM

## 2011-02-19 MED ORDER — IOHEXOL 300 MG/ML  SOLN
75.0000 mL | Freq: Once | INTRAMUSCULAR | Status: AC | PRN
  Administered 2011-02-19: 75 mL via INTRAVENOUS

## 2011-02-23 ENCOUNTER — Other Ambulatory Visit (INDEPENDENT_AMBULATORY_CARE_PROVIDER_SITE_OTHER): Payer: Self-pay | Admitting: Surgery

## 2011-02-25 NOTE — Progress Notes (Signed)
Sent to  Stanton Kidney to schedule

## 2011-02-26 ENCOUNTER — Other Ambulatory Visit (INDEPENDENT_AMBULATORY_CARE_PROVIDER_SITE_OTHER): Payer: Self-pay | Admitting: Surgery

## 2011-02-26 DIAGNOSIS — M542 Cervicalgia: Secondary | ICD-10-CM

## 2011-03-01 ENCOUNTER — Ambulatory Visit
Admission: RE | Admit: 2011-03-01 | Discharge: 2011-03-01 | Disposition: A | Discharge status: Home / Self Care | Payer: Medicare Other | Source: Ambulatory Visit | Attending: Surgery | Admitting: Surgery

## 2011-03-01 DIAGNOSIS — M542 Cervicalgia: Secondary | ICD-10-CM

## 2011-03-09 ENCOUNTER — Other Ambulatory Visit (HOSPITAL_COMMUNITY): Payer: Self-pay | Admitting: *Deleted

## 2011-03-09 DIAGNOSIS — M549 Dorsalgia, unspecified: Secondary | ICD-10-CM

## 2011-03-09 DIAGNOSIS — S239XXA Sprain of unspecified parts of thorax, initial encounter: Secondary | ICD-10-CM

## 2011-03-09 DIAGNOSIS — Z853 Personal history of malignant neoplasm of breast: Secondary | ICD-10-CM

## 2011-03-09 DIAGNOSIS — M4800 Spinal stenosis, site unspecified: Secondary | ICD-10-CM

## 2011-03-26 ENCOUNTER — Encounter: Payer: Self-pay | Admitting: Internal Medicine

## 2011-04-01 ENCOUNTER — Other Ambulatory Visit (HOSPITAL_COMMUNITY): Payer: Self-pay | Admitting: Oncology

## 2011-04-01 DIAGNOSIS — Z853 Personal history of malignant neoplasm of breast: Secondary | ICD-10-CM

## 2011-04-01 DIAGNOSIS — S239XXA Sprain of unspecified parts of thorax, initial encounter: Secondary | ICD-10-CM

## 2011-04-01 DIAGNOSIS — M549 Dorsalgia, unspecified: Secondary | ICD-10-CM

## 2011-04-01 DIAGNOSIS — M4800 Spinal stenosis, site unspecified: Secondary | ICD-10-CM

## 2011-04-12 ENCOUNTER — Other Ambulatory Visit: Payer: Medicare Other

## 2011-04-14 ENCOUNTER — Encounter: Payer: Self-pay | Admitting: Internal Medicine

## 2011-04-15 ENCOUNTER — Ambulatory Visit (INDEPENDENT_AMBULATORY_CARE_PROVIDER_SITE_OTHER): Payer: Medicare Other | Admitting: Internal Medicine

## 2011-04-15 ENCOUNTER — Encounter: Payer: Self-pay | Admitting: Internal Medicine

## 2011-04-15 ENCOUNTER — Telehealth: Payer: Self-pay | Admitting: Internal Medicine

## 2011-04-15 DIAGNOSIS — M4800 Spinal stenosis, site unspecified: Secondary | ICD-10-CM

## 2011-04-15 DIAGNOSIS — R5381 Other malaise: Secondary | ICD-10-CM

## 2011-04-15 DIAGNOSIS — I10 Essential (primary) hypertension: Secondary | ICD-10-CM

## 2011-04-15 DIAGNOSIS — M549 Dorsalgia, unspecified: Secondary | ICD-10-CM

## 2011-04-15 DIAGNOSIS — R5383 Other fatigue: Secondary | ICD-10-CM

## 2011-04-15 DIAGNOSIS — F411 Generalized anxiety disorder: Secondary | ICD-10-CM

## 2011-04-15 DIAGNOSIS — E785 Hyperlipidemia, unspecified: Secondary | ICD-10-CM

## 2011-04-15 NOTE — Progress Notes (Signed)
  Subjective:    Patient ID: [name], female    DOB: [birth year], 77 y.o.   MRN: [id number]  HPI  77 year old patient who is seen today with a chief complaint of sinus congestion. She has been using Afrin nasal spray and she states also acupuncture due to primarily sinus congestion she also describes some mild balance issues. She has remote history of breast cancer 30 years ago. She has chronic low back pain and a history of spinal stenosis. She has treated hypertension and history of dyslipidemia she is on chronic pain medications. She denies any purulent sinus drainage fever or focal sinus or dental pain.    Review of Systems  Constitutional: Negative.   HENT: Positive for congestion. Negative for hearing loss, sore throat, rhinorrhea, dental problem, sinus pressure and tinnitus.   Eyes: Negative for pain, discharge and visual disturbance.  Respiratory: Negative for cough and shortness of breath.   Cardiovascular: Negative for chest pain, palpitations and leg swelling.  Gastrointestinal: Negative for nausea, vomiting, abdominal pain, diarrhea, constipation, blood in stool and abdominal distention.  Genitourinary: Negative for dysuria, urgency, frequency, hematuria, flank pain, vaginal bleeding, vaginal discharge, difficulty urinating, vaginal pain and pelvic pain.  Musculoskeletal: Positive for back pain and gait problem. Negative for joint swelling and arthralgias.  Skin: Negative for rash.  Neurological: Positive for light-headedness. Negative for dizziness, syncope, speech difficulty, weakness, numbness and headaches.  Hematological: Negative for adenopathy.  Psychiatric/Behavioral: Negative for behavioral problems, dysphoric mood and agitation. The patient is not nervous/anxious.        Objective:   Physical Exam  Constitutional: She is oriented to person, place, and time. She appears well-developed and well-nourished. No distress.       Overweight. No distress. Afebrile.  Blood pressure 130/70  HENT:  Head: Normocephalic.  Right Ear: External ear normal.  Left Ear: External ear normal.  Mouth/Throat: Oropharynx is clear and moist.  Eyes: Conjunctivae and EOM are normal. Pupils are equal, round, and reactive to light.  Neck: Normal range of motion. Neck supple. No thyromegaly present.  Cardiovascular: Normal rate, regular rhythm, normal heart sounds and intact distal pulses.   Pulmonary/Chest: Effort normal and breath sounds normal.  Abdominal: Soft. Bowel sounds are normal. She exhibits no mass. There is no tenderness.  Musculoskeletal: Normal range of motion.  Lymphadenopathy:    She has no cervical adenopathy.  Neurological: She is alert and oriented to person, place, and time.  Skin: Skin is warm and dry. No rash noted.  Psychiatric: She has a normal mood and affect. Her behavior is normal.          Assessment & Plan:   Nonspecific sinus congestion and dizziness. Recommend that she continue using an antihistamine. She was given samples of Nasonex to take until her next scheduled office visit. Hypertension controlled Dyslipidemia Chronic back pain  CPX with PCP as scheduled in the near future

## 2011-04-15 NOTE — Patient Instructions (Signed)
Schedule  complete examination with Dr. Marylee Floras use both nares once daily  Call or return to clinic prn if these symptoms worsen or fail to improve as anticipated.

## 2011-04-15 NOTE — Telephone Encounter (Signed)
Please send cpx labs-v70.0 order to [facility] lab. Patient has medicare and refused to come in fasting at her cpx with Dr Lovell Sheehan. I told patient that there might be additional charges of having labs prior. Patient understood. Thanks.

## 2011-04-19 ENCOUNTER — Other Ambulatory Visit: Payer: Medicare Other

## 2011-04-21 ENCOUNTER — Other Ambulatory Visit (HOSPITAL_COMMUNITY): Payer: Medicare Other

## 2011-04-28 ENCOUNTER — Other Ambulatory Visit: Payer: Medicare Other

## 2011-04-28 ENCOUNTER — Other Ambulatory Visit (INDEPENDENT_AMBULATORY_CARE_PROVIDER_SITE_OTHER): Payer: Self-pay | Admitting: Surgery

## 2011-04-29 ENCOUNTER — Other Ambulatory Visit (INDEPENDENT_AMBULATORY_CARE_PROVIDER_SITE_OTHER): Payer: Self-pay | Admitting: Surgery

## 2011-04-30 LAB — VITAMIN D 25 HYDROXY (VIT D DEFICIENCY, FRACTURES): Vit D, 25-Hydroxy: 31 ng/mL (ref 30–89)

## 2011-05-05 ENCOUNTER — Encounter: Payer: Self-pay | Admitting: Internal Medicine

## 2011-05-05 ENCOUNTER — Ambulatory Visit (INDEPENDENT_AMBULATORY_CARE_PROVIDER_SITE_OTHER): Payer: Medicare Other | Admitting: Internal Medicine

## 2011-05-05 DIAGNOSIS — E785 Hyperlipidemia, unspecified: Secondary | ICD-10-CM

## 2011-05-05 DIAGNOSIS — Z0000 Encounter for general adult medical examination without abnormal findings: Secondary | ICD-10-CM

## 2011-05-05 DIAGNOSIS — R5381 Other malaise: Secondary | ICD-10-CM

## 2011-05-05 LAB — CBC WITH DIFFERENTIAL/PLATELET
Basophils Relative: 0.5 % (ref 0.0–3.0)
Eosinophils Relative: 1.3 % (ref 0.0–5.0)
HCT: 47.1 % — ABNORMAL HIGH (ref 36.0–46.0)
Hemoglobin: 15.8 g/dL — ABNORMAL HIGH (ref 12.0–15.0)
Lymphs Abs: 3.2 10*3/uL (ref 0.7–4.0)
Monocytes Relative: 5.1 % (ref 3.0–12.0)
Neutro Abs: 3.4 10*3/uL (ref 1.4–7.7)
RBC: 5.26 Mil/uL — ABNORMAL HIGH (ref 3.87–5.11)
WBC: 7.1 10*3/uL (ref 4.5–10.5)

## 2011-05-05 NOTE — Progress Notes (Signed)
Subjective:    [name] is a 77 y.o. female who presents for Medicare Annual/Subsequent preventive examination.  Preventive Screening-Counseling & Management  Tobacco History  Smoking status  . Former Smoker  . Quit date: 08/23/1977  Smokeless tobacco  . Never Used     Problems Prior to Visit 1. patient has an elevated parathyroid hormone and calcium and has been referred by her oncologist to Gen. surgery for surgical correction.  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 LIVER MASS  . HYPERLIPIDEMIA NEC/NOS  . HYPERCALCEMIA  . POLYCYTHEMIA  . ANXIETY  . DEPRESSION  . HYPERTENSION  . HEMORRHOIDS, INTERNAL  . External Hemorrhoids without Mention of Complication  . DIVERTICULOSIS, COLON  . CONSTIPATION  . IRRITABLE BOWEL SYNDROME  . INTERSTITIAL CYSTITIS  . STENOSIS, SPINAL, UNSPC REGION  . BACK PAIN  . OSTEOPOROSIS  . FATIGUE  . PARESTHESIA  . IMPAIRED FASTING GLUCOSE  . BREAST CANCER, HX OF    Medications Prior to Visit Current Outpatient Prescriptions on File Prior to Visit  Medication Sig Dispense Refill  . Calcium Carbonate-Vitamin D (CALCIUM 600+D) 600-400 MG-UNIT per tablet Take 1 tablet by mouth daily.       . carisoprodol (SOMA) 250 MG tablet Take 350 mg by mouth 3 (three) times daily as needed.        . carisoprodol (SOMA) 350 MG tablet Take 350 mg by mouth at bedtime as needed.        . diazepam (VALIUM) 5 MG tablet Take 5 mg by mouth every 8 (eight) hours as needed. BRAND NAMES ONLY       . Ergocalciferol 2000 UNITS TABS Take 2,000 Units by mouth daily.  90 tablet  0  . hydrOXYzine (VISTARIL) 25 MG capsule Take 25 mg by mouth at bedtime as needed.        . Menthol, Topical Analgesic, (SOMBRA COOL THERAPY) 6 % GEL Apply topically as needed.         Marland Kitchen oxyCODONE-acetaminophen (PERCOCET) 5-325 MG per tablet Take 1 tablet by mouth daily as needed. Severe pain       . Polyethyl Glycol-Propyl Glycol (SYSTANE) 0.4-0.3 % SOLN Apply to eye as needed.        . progesterone (PROMETRIUM) 100 MG capsule Take 100 mg by mouth at bedtime.        . Red Yeast Rice Extract 600 MG CAPS Take by mouth daily.          Current Medications (verified) Current Outpatient Prescriptions  Medication Sig Dispense Refill  . Calcium Carbonate-Vitamin D (CALCIUM 600+D) 600-400 MG-UNIT per tablet Take 1 tablet by mouth daily.       . carisoprodol (SOMA) 250 MG tablet Take 350 mg by mouth 3 (three) times daily as needed.        . carisoprodol (SOMA) 350 MG tablet Take 350 mg by mouth at bedtime as needed.        . diazepam (VALIUM) 5 MG tablet Take 5 mg by mouth every 8 (eight) hours as needed. BRAND NAMES ONLY       . Ergocalciferol 2000 UNITS TABS Take 2,000 Units by mouth daily.  90 tablet  0  . hydrOXYzine (VISTARIL) 25 MG capsule Take 25 mg by mouth at bedtime as needed.        Marland Kitchen  Menthol, Topical Analgesic, (SOMBRA COOL THERAPY) 6 % GEL Apply topically as needed.        Marland Kitchen oxyCODONE-acetaminophen (PERCOCET) 5-325 MG per tablet Take 1 tablet by mouth daily as needed. Severe pain       . Polyethyl Glycol-Propyl Glycol (SYSTANE) 0.4-0.3 % SOLN Apply to eye as needed.        . progesterone (PROMETRIUM) 100 MG capsule Take 100 mg by mouth at bedtime.        . Red Yeast Rice Extract 600 MG CAPS Take by mouth daily.           Allergies (verified) Hydrocodone-acetaminophen; Iodine; Naproxen sodium; Oxycodone hcl; Pentosan polysulfate sodium; Pseudoephedrine; Succinylcholine; and Tramadol hcl   PAST HISTORY  Family History Family History  Problem Relation Age of Onset  . Heart disease Sister   . Hypertension Sister   . Asthma Sister     Social History History  Substance Use Topics  . Smoking status: Former Smoker    Quit date: 08/23/1977  . Smokeless tobacco:  Never Used  . Alcohol Use: No     Are there smokers in your home (other than you)? No  Risk Factors Current exercise habits: The patient does not participate in regular exercise at present.  Dietary issues discussed: yes, increased carbs discussed limitation   Cardiac risk factors: advanced age (older than 44 for men, 8 for women).  Depression Screen (Note: if answer to either of the following is "Yes", a more complete depression screening is indicated)   Over the past two weeks, have you felt down, depressed or hopeless? No  Over the past two weeks, have you felt little interest or pleasure in doing things? No  Have you lost interest or pleasure in daily life? No  Do you often feel hopeless? No  Do you cry easily over simple problems? No  Activities of Daily Living In your present state of health, do you have any difficulty performing the following activities?:  Driving? No Managing money?  No Feeding yourself? No Getting from bed to chair? No Climbing a flight of stairs? No Preparing food and eating?: No Bathing or showering? No Getting dressed: No Getting to the toilet? No Using the toilet:No Moving around from place to place: No In the past year have you fallen or had a near fall?:No   Are you sexually active?  Yes  Do you have more than one partner?  No  Hearing Difficulties: No Do you often ask people to speak up or repeat themselves? No Do you experience ringing or noises in your ears? No Do you have difficulty understanding soft or whispered voices? No   Do you feel that you have a problem with memory? Yes  Do you often misplace items? No  Do you feel safe at home?  Yes  Cognitive Testing  Alert? Yes  Normal Appearance?Yes  Oriented to person? Yes  Place? Yes   Time? Yes  Recall of three objects?  Yes  Can perform simple calculations? Yes  Displays appropriate judgment?Yes  Can read the correct time from a watch face?Yes   Advanced Directives have been  discussed with the patient? Yes  List the Names of Other Physician/Practitioners you currently use: 1.   Devra Dopp oncology Darnell Level  Gen. surgery Indicate any recent Medical Services you may have received from other than [facility] providers in the past year (date may be approximate).  Immunization History  Administered Date(s) Administered  . Influenza Whole 05/23/2009  .  Pneumococcal Polysaccharide 07/23/1997, 10/31/2009  . Td 08/24/2002    Screening Tests Health Maintenance  Topic Date Due  . Zostavax  03/27/1992  . Influenza Vaccine  05/24/2011  . Tetanus/tdap  08/24/2012  . Colonoscopy  11/05/2018  . Pneumococcal Polysaccharide Vaccine Age 31 And Over  Completed    All answers were reviewed with the patient and necessary referrals were made:  Carrie Mew   05/05/2011   History reviewed: allergies, current medications, past family history, past medical history, past social history, past surgical history and problem list  Review of Systems A comprehensive review of systems was negative.    Objective:     Vision by Snellen chart: right eye:20/20, left eye:20/20  Body mass index is 30.55 kg/(m^2). BP 144/82  Pulse 92  Temp 98.3 F (36.8 C)  Resp 16  Ht 5\' 4"  (1.626 m)  Wt 178 lb (80.74 kg)  BMI 30.55 kg/m2  BP 144/82  Pulse 92  Temp 98.3 F (36.8 C)  Resp 16  Ht 5\' 4"  (1.626 m)  Wt 178 lb (80.74 kg)  BMI 30.55 kg/m2  General Appearance:    Alert, cooperative, no distress, appears stated age  Head:    Normocephalic, without obvious abnormality, atraumatic  Eyes:    PERRL, conjunctiva/corneas clear, EOM's intact, fundi    benign, both eyes  Ears:    Normal TM's and external ear canals, both ears  Nose:   Nares normal, septum midline, mucosa normal, no drainage    or sinus tenderness  Throat:   Lips, mucosa, and tongue normal; teeth and gums normal  Neck:   Supple, symmetrical, trachea midline, no adenopathy;    thyroid:  no  enlargement/tenderness/nodules; no carotid   bruit or JVD  Back:     Symmetric, no curvature, ROM normal, no CVA tenderness  Lungs:     Clear to auscultation bilaterally, respirations unlabored  Chest Wall:    No tenderness or deformity   Heart:    Regular rate and rhythm, S1 and S2 normal, no murmur, rub   or gallop  Breast Exam:    No tenderness, masses, or nipple abnormality  Abdomen:     Soft, non-tender, bowel sounds active all four quadrants,    no masses, no organomegaly  Genitalia:    Normal female without lesion, discharge or tenderness  Rectal:    Normal tone, normal prostate, no masses or tenderness;   guaiac negative stool  Extremities:   Extremities normal, atraumatic, no cyanosis or edema  Pulses:   2+ and symmetric all extremities  Skin:   Skin color, texture, turgor normal, no rashes or lesions  Lymph nodes:   Cervical, supraclavicular, and axillary nodes normal  Neurologic:   CNII-XII intact, normal strength, sensation and reflexes    throughout     patient has scar from the mastectomy on her right breast left breast exam was normal adenopathy detected   Assessment:      This is a routine physical examination for this healthy  Female. Reviewed all health maintenance protocols including mammography colonoscopy bone density and reviewed appropriate screening labs. Her immunization history was reviewed as well as her current medications and allergies refills of her chronic medications were given and the plan for yearly health maintenance was discussed all orders and referrals were made as appropriate.      Plan:     During the course of the visit the patient was educated and counseled about appropriate screening and preventive services including:  Pneumococcal vaccine   Influenza vaccine  Td vaccine  Nutrition counseling   Advanced directives: has NO advanced directive - not interested in additional information  Diet review for nutrition referral? Yes ____   Not Indicatedx  Patient Instructions (the written plan) was given to the patient.  Medicare Attestation I have personally reviewed: The patient's medical and social history Their use of alcohol, tobacco or illicit drugs Their current medications and supplements The patient's functional ability including ADLs,fall risks, home safety risks, cognitive, and hearing and visual impairment Diet and physical activities Evidence for depression or mood disorders  The patient's weight, height, BMI, and visual acuity have been recorded in the chart.  I have made referrals, counseling, and provided education to the patient based on review of the above and I have provided the patient with a written personalized care plan for preventive services.   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  Much of her pain from a muscular standpoint may be associated with this condition we have asked her to defer her visit to the [facility] until she addresses the parathyroid adenoma.  We spent most of the 30 minutes in counseling and instruction.    Carrie Mew   05/05/2011

## 2011-05-06 ENCOUNTER — Telehealth (INDEPENDENT_AMBULATORY_CARE_PROVIDER_SITE_OTHER): Payer: Self-pay

## 2011-05-06 LAB — BASIC METABOLIC PANEL
GFR: 78 mL/min (ref >60.00)
Glucose, Bld: 109 mg/dL — ABNORMAL HIGH (ref 70–99)
Potassium: 4.3 mEq/L (ref 3.5–5.1)
Sodium: 143 mEq/L (ref 135–145)

## 2011-05-06 LAB — HEPATIC FUNCTION PANEL
ALT: 31 U/L (ref 0–35)
Total Bilirubin: 0.5 mg/dL (ref 0.3–1.2)
Total Protein: 7.5 g/dL (ref 6.0–8.3)

## 2011-05-06 LAB — LIPID PANEL
HDL: 46.6 mg/dL (ref >39.00)
Triglycerides: 215 mg/dL — ABNORMAL HIGH (ref 0.0–149.0)

## 2011-05-06 NOTE — Telephone Encounter (Signed)
Let message for patient to call and schedule follow up appointment with Dr. Gerrit Friends.

## 2011-05-24 ENCOUNTER — Encounter: Payer: Self-pay | Admitting: Surgery

## 2011-05-24 ENCOUNTER — Telehealth: Payer: Self-pay | Admitting: Internal Medicine

## 2011-05-24 NOTE — Telephone Encounter (Signed)
Mailed labs to pt

## 2011-05-24 NOTE — Telephone Encounter (Signed)
Would like a copy of her labs faxed to her home. Thanks.

## 2011-05-26 ENCOUNTER — Encounter (INDEPENDENT_AMBULATORY_CARE_PROVIDER_SITE_OTHER): Payer: Self-pay | Admitting: Surgery

## 2011-05-26 ENCOUNTER — Ambulatory Visit (INDEPENDENT_AMBULATORY_CARE_PROVIDER_SITE_OTHER): Payer: Medicare Other | Admitting: Surgery

## 2011-05-26 VITALS — BP 158/100 | HR 104 | Temp 97.4°F | Resp 24 | Ht 64.0 in | Wt 177.6 lb

## 2011-05-26 DIAGNOSIS — E210 Primary hyperparathyroidism: Secondary | ICD-10-CM

## 2011-05-26 NOTE — Progress Notes (Signed)
Visit Diagnoses: 1. Hyperparathyroidism, primary     HISTORY: The patient and her husband returned today at my request to review the results of her studies over the past several months. Patient was last seen in May 2012. In June 2012 she underwent nuclear medicine parathyroid scan. This study failed to reveal a parathyroid adenoma. In late June she underwent a CT scan of the neck. This study likewise failed to identify a parathyroid adenoma. Laboratory studies were repeated. These continued to show markedly elevated levels with a intact PTH of 181.0 and an elevated total calcium level of 11.3. Vitamin D level has normalized at 31.  Patient and her husband returned today to review these results and to make a decision regarding surgical intervention for primary hyperparathyroidism.   PERTINENT REVIEW OF SYSTEMS: Positive for spinal compression fracture, osteoporosis, fatigue   EXAM: HEENT: normocephalic; pupils equal and reactive; sclerae clear; dentition good; mucous membranes moist NECK:  1.5 cm palpable nodule left lower thyroid lobe; symmetric on extension; no palpable anterior or posterior cervical lymphadenopathy; no supraclavicular masses; no tenderness CHEST: clear to auscultation bilaterally without rales, rhonchi, or wheezes CARDIAC: regular rate and rhythm without significant murmur; peripheral pulses are full EXT:  non-tender without edema; no deformity NEURO: no gross focal deficits; no sign of tremor   IMPRESSION: #1 primary hyperparathyroidism #2 thyroid nodules #3 osteoporosis   PLAN: I had a lengthy discussion with the patient and her husband. We reviewed the above results. Given the fact that she has persistent hypercalcemia and markedly elevated parathyroid hormone levels in the face of complications, I have recommended neck exploration and parathyroidectomy. We have discussed the procedure, the hospital stay, and the postoperative recovery.  We have discussed the  alternative of continued observation. Patient and her husband agree to proceed with surgery.  The risks and benefits of the procedure have been discussed at length with the patient.  The patient understands the proposed procedure, potential alternative treatments, and the course of recovery to be expected.  All of the patient's questions have been answered at this time.  The patient wishes to proceed with surgery and will schedule a date for their procedure through our office staff.    Velora Heckler, MD, FACS General & Endocrine Surgery [facility]

## 2011-05-28 ENCOUNTER — Telehealth: Payer: Self-pay | Admitting: Internal Medicine
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
## 2011-05-28 NOTE — Telephone Encounter (Signed)
Talked with pt and told her that medical supplies company [employer] people like her that if dr Lovell Sheehan thought she needed that he would tell her.

## 2011-06-03 LAB — COMPREHENSIVE METABOLIC PANEL
ALT: 33
AST: 20
Albumin: 4.2
Alkaline Phosphatase: 92
BUN: 11
CO2: 29
Calcium: 10.9 — ABNORMAL HIGH
Chloride: 104
Creatinine, Ser: 0.67
GFR calc Af Amer: >60
GFR calc non Af Amer: >60
Glucose, Bld: 106 — ABNORMAL HIGH
Potassium: 3.8
Sodium: 137
Total Bilirubin: 0.6
Total Protein: 7.3

## 2011-06-03 LAB — DIFFERENTIAL
Basophils Absolute: 0
Basophils Relative: 1
Eosinophils Relative: 1
Lymphocytes Relative: 36
Monocytes Absolute: 0.6
Neutro Abs: 5.4

## 2011-06-03 LAB — CBC
MCHC: 33.8
Platelets: 247
RDW: 12.8

## 2011-06-09 LAB — BASIC METABOLIC PANEL
BUN: 8
CO2: 30
Calcium: 10.7 — ABNORMAL HIGH
GFR calc non Af Amer: >60
Glucose, Bld: 99
Potassium: 3.9

## 2011-06-09 LAB — CBC
HCT: 45.7
MCHC: 33.8
Platelets: 244
RDW: 12.1

## 2011-06-24 ENCOUNTER — Other Ambulatory Visit: Payer: Self-pay

## 2011-06-24 ENCOUNTER — Ambulatory Visit (HOSPITAL_COMMUNITY)
Admission: RE | Admit: 2011-06-24 | Discharge: 2011-06-24 | Disposition: A | Discharge status: Home / Self Care | Payer: Medicare Other | Source: Ambulatory Visit | Attending: Surgery | Admitting: Surgery

## 2011-06-24 ENCOUNTER — Telehealth: Payer: Self-pay | Admitting: *Deleted

## 2011-06-24 ENCOUNTER — Other Ambulatory Visit (INDEPENDENT_AMBULATORY_CARE_PROVIDER_SITE_OTHER): Payer: Self-pay | Admitting: Surgery

## 2011-06-24 ENCOUNTER — Encounter (HOSPITAL_COMMUNITY): Payer: Self-pay

## 2011-06-24 ENCOUNTER — Encounter (HOSPITAL_COMMUNITY): Payer: Medicare Other

## 2011-06-24 DIAGNOSIS — Z01812 Encounter for preprocedural laboratory examination: Secondary | ICD-10-CM | POA: Insufficient documentation

## 2011-06-24 DIAGNOSIS — Z01818 Encounter for other preprocedural examination: Secondary | ICD-10-CM

## 2011-06-24 LAB — DIFFERENTIAL
Basophils Absolute: 0.1 10*3/uL (ref 0.0–0.1)
Basophils Relative: 1 % (ref 0–1)
Eosinophils Relative: 2 % (ref 0–5)
Lymphocytes Relative: 44 % (ref 12–46)
Monocytes Absolute: 0.6 10*3/uL (ref 0.1–1.0)

## 2011-06-24 LAB — URINALYSIS, ROUTINE W REFLEX MICROSCOPIC
Bilirubin Urine: NEGATIVE
Hgb urine dipstick: NEGATIVE
Ketones, ur: NEGATIVE mg/dL
Nitrite: NEGATIVE
Protein, ur: NEGATIVE mg/dL
Specific Gravity, Urine: 1.03 (ref 1.005–1.030)
Urobilinogen, UA: 0.2 mg/dL (ref 0.0–1.0)

## 2011-06-24 LAB — BASIC METABOLIC PANEL
CO2: 26 mEq/L (ref 19–32)
Calcium: 11.3 mg/dL — ABNORMAL HIGH (ref 8.4–10.5)
Chloride: 103 mEq/L (ref 96–112)
Creatinine, Ser: 0.8 mg/dL (ref 0.50–1.10)
Glucose, Bld: 107 mg/dL — ABNORMAL HIGH (ref 70–99)

## 2011-06-24 LAB — URINE MICROSCOPIC-ADD ON

## 2011-06-24 LAB — CBC
HCT: 45.9 % (ref 36.0–46.0)
MCHC: 33.3 g/dL (ref 30.0–36.0)
Platelets: 208 10*3/uL (ref 150–400)
RDW: 12.7 % (ref 11.5–15.5)
WBC: 8.8 10*3/uL (ref 4.0–10.5)

## 2011-06-24 LAB — SURGICAL PCR SCREEN: MRSA, PCR: NEGATIVE

## 2011-06-24 LAB — PROTIME-INR: INR: 0.93 (ref 0.00–1.49)

## 2011-06-24 MED ORDER — AZITHROMYCIN 250 MG PO TABS: ORAL_TABLET | ORAL | Status: AC

## 2011-06-24 NOTE — Telephone Encounter (Signed)
Pt is having thyroid surgery next Tuesday, and has Pre Op today.  She is having URI symptoms with productive (yellow) cough.  Asking if she should have the surgery.  Advised her to go to the Pre-Op appt, and keep [place] posted on what they find.

## 2011-06-24 NOTE — Telephone Encounter (Signed)
Per dr jenkins-may have z pack 

## 2011-06-24 NOTE — Telephone Encounter (Signed)
Left message on pt's voice mail to pick up Zpak.

## 2011-06-25 NOTE — Pre-Procedure Instructions (Signed)
Faxed urine with micro to Dr Gerrit Friends with confirmation on 06/24/11

## 2011-06-25 NOTE — Progress Notes (Signed)
Quick Note:  These results are acceptable for scheduled surgery. [facility] ______ 

## 2011-06-28 NOTE — Progress Notes (Signed)
Faxed to hospital.

## 2011-06-29 ENCOUNTER — Encounter (HOSPITAL_COMMUNITY)
Admission: RE | Disposition: A | Discharge status: Home / Self Care | Payer: Self-pay | Source: Ambulatory Visit | Attending: Surgery

## 2011-06-29 ENCOUNTER — Ambulatory Visit (HOSPITAL_COMMUNITY)
Admission: RE | Admit: 2011-06-29 | Discharge: 2011-06-30 | Disposition: A | Discharge status: Home / Self Care | Payer: Medicare Other | Source: Ambulatory Visit | Attending: Surgery | Admitting: Surgery

## 2011-06-29 ENCOUNTER — Other Ambulatory Visit (INDEPENDENT_AMBULATORY_CARE_PROVIDER_SITE_OTHER): Payer: Self-pay | Admitting: Surgery

## 2011-06-29 ENCOUNTER — Ambulatory Visit (HOSPITAL_COMMUNITY): Payer: Medicare Other | Admitting: Anesthesiology

## 2011-06-29 ENCOUNTER — Encounter (HOSPITAL_COMMUNITY): Payer: Self-pay | Admitting: Anesthesiology

## 2011-06-29 ENCOUNTER — Encounter (HOSPITAL_COMMUNITY): Payer: Self-pay | Admitting: *Deleted

## 2011-06-29 DIAGNOSIS — E210 Primary hyperparathyroidism: Secondary | ICD-10-CM

## 2011-06-29 DIAGNOSIS — E785 Hyperlipidemia, unspecified: Secondary | ICD-10-CM | POA: Insufficient documentation

## 2011-06-29 DIAGNOSIS — I10 Essential (primary) hypertension: Secondary | ICD-10-CM | POA: Insufficient documentation

## 2011-06-29 DIAGNOSIS — M810 Age-related osteoporosis without current pathological fracture: Secondary | ICD-10-CM | POA: Insufficient documentation

## 2011-06-29 DIAGNOSIS — Z853 Personal history of malignant neoplasm of breast: Secondary | ICD-10-CM | POA: Insufficient documentation

## 2011-06-29 HISTORY — PX: PARATHYROIDECTOMY: SHX19

## 2011-06-29 HISTORY — PX: PARATHYROID EXPLORATION: SHX732

## 2011-06-29 SURGERY — EXPLORATION, PARATHYROID
Anesthesia: General | Site: Neck | Laterality: Left | Wound class: Clean

## 2011-06-29 MED ORDER — PROMETHAZINE HCL 25 MG/ML IJ SOLN: 6.2500 mg | INTRAMUSCULAR | Status: DC | PRN

## 2011-06-29 MED ORDER — BUPIVACAINE HCL (PF) 0.25 % IJ SOLN
INTRAMUSCULAR | Status: DC | PRN
  Administered 2011-06-29: 10 mL

## 2011-06-29 MED ORDER — FENTANYL CITRATE 0.05 MG/ML IJ SOLN
INTRAMUSCULAR | Status: DC | PRN
  Administered 2011-06-29: 25 ug via INTRAVENOUS
  Administered 2011-06-29 (×2): 100 ug via INTRAVENOUS

## 2011-06-29 MED ORDER — MENTHOL (TOPICAL ANALGESIC) 6 % EX GEL: 1.0000 "application " | CUTANEOUS | Status: DC | PRN

## 2011-06-29 MED ORDER — HEMOSTATIC AGENTS (NO CHARGE) OPTIME
TOPICAL | Status: DC | PRN
  Administered 2011-06-29: 1

## 2011-06-29 MED ORDER — HYDRALAZINE HCL 20 MG/ML IJ SOLN
INTRAMUSCULAR | Status: DC | PRN
  Administered 2011-06-29: 3 mg via INTRAVENOUS

## 2011-06-29 MED ORDER — HYDROXYZINE HCL 25 MG PO TABS
25.0000 mg | ORAL_TABLET | Freq: Every day | ORAL | Status: DC
  Administered 2011-06-29: 25 mg via ORAL
  Filled 2011-06-29 (×2): qty 1

## 2011-06-29 MED ORDER — CEFAZOLIN SODIUM-DEXTROSE 2-3 GM-% IV SOLR
2.0000 g | INTRAVENOUS | Status: AC
  Administered 2011-06-29: 2 g via INTRAVENOUS
  Filled 2011-06-29: qty 50

## 2011-06-29 MED ORDER — HYDROMORPHONE HCL PF 2 MG/ML IJ SOLN
1.0000 mg | INTRAMUSCULAR | Status: DC | PRN
  Administered 2011-06-29 – 2011-06-30 (×3): 1 mg via INTRAVENOUS
  Filled 2011-06-29 (×3): qty 1

## 2011-06-29 MED ORDER — PHENYLEPHRINE HCL 10 MG/ML IJ SOLN
INTRAMUSCULAR | Status: DC | PRN
  Administered 2011-06-29: 100 ug via INTRAVENOUS

## 2011-06-29 MED ORDER — ACETAMINOPHEN 10 MG/ML IV SOLN
INTRAVENOUS | Status: DC | PRN
  Administered 2011-06-29: 1000 mg via INTRAVENOUS

## 2011-06-29 MED ORDER — CISATRACURIUM BESYLATE 2 MG/ML IV SOLN
INTRAVENOUS | Status: DC | PRN
  Administered 2011-06-29: 10 mg via INTRAVENOUS

## 2011-06-29 MED ORDER — CEFAZOLIN SODIUM 1-5 GM-% IV SOLN
INTRAVENOUS | Status: AC
  Filled 2011-06-29: qty 100

## 2011-06-29 MED ORDER — CROMOLYN SODIUM 5.2 MG/ACT NA AERS
2.0000 | INHALATION_SPRAY | Freq: Every day | NASAL | Status: DC | PRN
  Filled 2011-06-29: qty 13

## 2011-06-29 MED ORDER — ETOMIDATE 2 MG/ML IV SOLN
INTRAVENOUS | Status: DC | PRN
  Administered 2011-06-29: 14 mg via INTRAVENOUS

## 2011-06-29 MED ORDER — FENTANYL CITRATE 0.05 MG/ML IJ SOLN
INTRAMUSCULAR | Status: AC
  Filled 2011-06-29: qty 2

## 2011-06-29 MED ORDER — PROMETHAZINE HCL 25 MG/ML IJ SOLN: 12.5000 mg | Freq: Four times a day (QID) | INTRAMUSCULAR | Status: DC | PRN

## 2011-06-29 MED ORDER — ACETAMINOPHEN 10 MG/ML IV SOLN
INTRAVENOUS | Status: AC
  Filled 2011-06-29: qty 100

## 2011-06-29 MED ORDER — BUPIVACAINE HCL (PF) 0.25 % IJ SOLN
INTRAMUSCULAR | Status: AC
  Filled 2011-06-29: qty 30

## 2011-06-29 MED ORDER — GRX ANALGESIC BALM EX OINT: 1.0000 "application " | TOPICAL_OINTMENT | CUTANEOUS | Status: DC | PRN

## 2011-06-29 MED ORDER — DIAZEPAM 5 MG PO TABS
5.0000 mg | ORAL_TABLET | Freq: Three times a day (TID) | ORAL | Status: DC | PRN
  Administered 2011-06-29: 5 mg via ORAL
  Filled 2011-06-29 (×2): qty 1

## 2011-06-29 MED ORDER — ESMOLOL HCL 10 MG/ML IV SOLN
INTRAVENOUS | Status: DC | PRN
  Administered 2011-06-29: 40 mg via INTRAVENOUS

## 2011-06-29 MED ORDER — SODIUM CHLORIDE 0.9 % IR SOLN
Status: DC | PRN
  Administered 2011-06-29: 1000 mL

## 2011-06-29 MED ORDER — PROGESTERONE MICRONIZED 100 MG PO CAPS
100.0000 mg | ORAL_CAPSULE | Freq: Every day | ORAL | Status: DC
  Administered 2011-06-29: 100 mg via ORAL
  Filled 2011-06-29 (×2): qty 1

## 2011-06-29 MED ORDER — LACTATED RINGERS IV SOLN
INTRAVENOUS | Status: DC | PRN
  Administered 2011-06-29 (×2): via INTRAVENOUS

## 2011-06-29 MED ORDER — FENTANYL CITRATE 0.05 MG/ML IJ SOLN
25.0000 ug | INTRAMUSCULAR | Status: DC | PRN
  Administered 2011-06-29 (×3): 25 ug via INTRAVENOUS
  Administered 2011-06-29: 50 ug via INTRAVENOUS

## 2011-06-29 MED ORDER — FENTANYL CITRATE 0.05 MG/ML IJ SOLN
INTRAMUSCULAR | Status: AC
  Administered 2011-06-29: 25 ug via INTRAVENOUS
  Filled 2011-06-29: qty 2

## 2011-06-29 MED ORDER — HYDROXYZINE PAMOATE 25 MG PO CAPS
25.0000 mg | ORAL_CAPSULE | Freq: Every day | ORAL | Status: DC
  Filled 2011-06-29 (×2): qty 1

## 2011-06-29 MED ORDER — ONDANSETRON HCL 4 MG/2ML IJ SOLN
INTRAMUSCULAR | Status: DC | PRN
  Administered 2011-06-29: 4 mg via INTRAVENOUS

## 2011-06-29 MED ORDER — LACTATED RINGERS IV SOLN: INTRAVENOUS | Status: DC

## 2011-06-29 MED ORDER — PROPOFOL 10 MG/ML IV EMUL
INTRAVENOUS | Status: DC | PRN
  Administered 2011-06-29 (×2): 50 mg via INTRAVENOUS

## 2011-06-29 SURGICAL SUPPLY — 43 items
APL SKNCLS STERI-STRIP NONHPOA (GAUZE/BANDAGES/DRESSINGS) ×1
ATTRACTOMAT 16X20 MAGNETIC DRP (DRAPES) ×2 IMPLANT
BENZOIN TINCTURE PRP APPL 2/3 (GAUZE/BANDAGES/DRESSINGS) ×2 IMPLANT
BLADE HEX COATED 2.75 (ELECTRODE) ×2 IMPLANT
BLADE SURG 15 STRL LF DISP TIS (BLADE) ×1 IMPLANT
BLADE SURG 15 STRL SS (BLADE) ×2
CANISTER SUCTION 2500CC (MISCELLANEOUS) ×2 IMPLANT
CHLORAPREP W/TINT 10.5 ML (MISCELLANEOUS) ×2 IMPLANT
CLIP TI MEDIUM 6 (CLIP) ×3 IMPLANT
CLIP TI WIDE RED SMALL 6 (CLIP) ×3 IMPLANT
CLOTH BEACON ORANGE TIMEOUT ST (SAFETY) ×2 IMPLANT
DISSECTOR ROUND CHERRY 3/8 STR (MISCELLANEOUS) ×1 IMPLANT
DRAPE PED LAPAROTOMY (DRAPES) ×2 IMPLANT
DRESSING SURGICEL FIBRLLR 1X2 (HEMOSTASIS) ×1 IMPLANT
DRSG SURGICEL FIBRILLAR 1X2 (HEMOSTASIS) ×2
ELECT REM PT RETURN 9FT ADLT (ELECTROSURGICAL) ×2
ELECTRODE REM PT RTRN 9FT ADLT (ELECTROSURGICAL) ×1 IMPLANT
GAUZE SPONGE 4X4 16PLY XRAY LF (GAUZE/BANDAGES/DRESSINGS) ×2 IMPLANT
GLOVE BIOGEL PI IND STRL 7.0 (GLOVE) ×1 IMPLANT
GLOVE BIOGEL PI INDICATOR 7.0 (GLOVE) ×1
GLOVE SURG ORTHO 8.0 STRL STRW (GLOVE) ×2 IMPLANT
GOWN STRL NON-REIN LRG LVL3 (GOWN DISPOSABLE) ×2 IMPLANT
GOWN STRL REIN XL XLG (GOWN DISPOSABLE) ×4 IMPLANT
KIT BASIN OR (CUSTOM PROCEDURE TRAY) ×2 IMPLANT
NEEDLE HYPO 22GX1.5 SAFETY (NEEDLE) ×2 IMPLANT
NS IRRIG 1000ML POUR BTL (IV SOLUTION) ×2 IMPLANT
PACK BASIC VI WITH GOWN DISP (CUSTOM PROCEDURE TRAY) ×2 IMPLANT
PENCIL BUTTON HOLSTER BLD 10FT (ELECTRODE) ×2 IMPLANT
SPONGE GAUZE 4X4 12PLY (GAUZE/BANDAGES/DRESSINGS) ×1 IMPLANT
STAPLER VISISTAT 35W (STAPLE) ×2 IMPLANT
STRIP CLOSURE SKIN 1/2X4 (GAUZE/BANDAGES/DRESSINGS) ×2 IMPLANT
SUT MNCRL AB 4-0 PS2 18 (SUTURE) ×2 IMPLANT
SUT SILK 2 0 (SUTURE) ×2
SUT SILK 2-0 18XBRD TIE 12 (SUTURE) ×1 IMPLANT
SUT SILK 3 0 (SUTURE)
SUT SILK 3-0 18XBRD TIE 12 (SUTURE) IMPLANT
SUT VIC AB 3-0 SH 18 (SUTURE) ×2 IMPLANT
SUT VIC AB 3-0 SH 8-18 (SUTURE) ×1 IMPLANT
SYR CONTROL 10ML LL (SYRINGE) ×2 IMPLANT
TAPE CLOTH SURG 4X10 WHT LF (GAUZE/BANDAGES/DRESSINGS) ×1 IMPLANT
TAPE STRIPS DRAPE STRL (GAUZE/BANDAGES/DRESSINGS) ×1 IMPLANT
TOWEL OR 17X26 10 PK STRL BLUE (TOWEL DISPOSABLE) ×2 IMPLANT
YANKAUER SUCT BULB TIP 10FT TU (MISCELLANEOUS) ×2 IMPLANT

## 2011-06-29 NOTE — Op Note (Signed)
Operative report  Preoperative diagnosis: Primary hyperparathyroidism  Postop diagnosis: Same  Surgeon:  Velora Heckler, MD, FACS  Anesthesia: General  Preparation: Chlora-prep  Estimated blood loss: Minimal  Indications: Patient is a 77 year old white female with biochemical evidence of primary hyperparathyroidism. Localization studies including nuclear medicine parathyroid scan and CT scan of the neck failed to reveal the location of a parathyroid adenoma. Serum calcium levels were elevated at 11.3. Intact PTH level was elevated at 181. Patient now comes to surgery for neck exploration and parathyroidectomy.  Procedure: Patient is seen and evaluated in the holding area. Patient is prepared and brought to operating room #11. Patient is placed in the supine position on the operating room table. Following administration of general anesthesia the patient is positioned and then prepped and draped in the usual aseptic fashion. After ascertaining that an adequate level of anesthesia been achieved, a Kocher incision is made with a #15 blade. Dissection is carried through subcutaneous tissues and platysma. Hemostasis is obtained with the electrocautery. Skin flaps are elevated cephalad and caudad. A Mahorner self-retaining retractor is placed for exposure.  Strap muscles were incised in the midline with the electrocautery. Dissection is begun on the left side of the neck. Strap muscles were reflected laterally exposing the left thyroid lobe. Left lobe is small and somewhat nodular. There are no dominant nor discrete masses. Dissection in the lateral neck reveals an enlarged parathyroid gland in the left superior position. This was gently dissected out but allowed to remain on its vascular pedicle for the time being. Further dissection reveals what appears to be a normal parathyroid in the left inferior position  Next we explored the right neck by reflecting the strap muscles laterally exposing the right  thyroid lobe. Right lobe is also somewhat nodular without discrete or dominant mass. Exploration reveals what appears to be a normal superior parathyroid gland. Inferior gland is not identified.  Returning to the left side the left superior parathyroid gland is excised by dividing its vascular pedicle between small ligaclips. Gland is submitted to pathology where frozen section confirmed parathyroid tissue which is hyperplastic and consistent with small parathyroid adenoma.  Neck is irrigated with warm saline. Good hemostasis is achieved. Surgicel was placed in the neck bilaterally. Strap muscles are reapproximated in the midline with interrupted 3-0 Vicryl sutures. Platysma is closed with interrupted 3-0 Vicryl sutures. Skin is anesthetized with local anesthetic. Skin is closed with a running 4-0 Monocryl subcuticular suture. Wound was washed and dried and benzoin and Steri-Strips are applied. Sterile dressings are applied. Patient is awakened from anesthesia and brought to the recovery room in stable condition. The patient tolerated the procedure well.  Velora Heckler, MD, FACS General & Endocrine Surgery [facility]

## 2011-06-29 NOTE — Anesthesia Postprocedure Evaluation (Signed)
  Anesthesia Post-op Note  Patient: [name]  Procedure(s) Performed:  PARATHYROID EXPLORATION - Left Superior Parathyroidectomy  Patient Location: PACU  Anesthesia Type: General  Level of Consciousness: awake, alert , oriented and responds to stimulation  Airway and Oxygen Therapy: Patient Spontanous Breathing and Patient connected to nasal cannula oxygen  Post-op Pain: mild  Post-op Assessment: Post-op Vital signs reviewed, Patient's Cardiovascular Status Stable, Respiratory Function Stable, Patent Airway and Pain level controlled  Post-op Vital Signs: stable  Complications: No apparent anesthesia complications

## 2011-06-29 NOTE — H&P (Signed)
No chief complaint on file.  Chief complaint: Primary hyperparathyroidism  HISTORY: Patient is a 77 year old white female referred with biochemical evidence of primary hyperparathyroidism. Patient has an elevated serum calcium level of 11.3. Her intact PTH level is markedly elevated at 181.0. Vitamin D level is normal at 31. Patient was seen and evaluated in my practice. She underwent nuclear medicine parathyroid scan which failed to reveal the location of a parathyroid adenoma. In June 2012 she underwent CT scan of the neck which likewise failed to identify a parathyroid adenoma. After discussion with the patient and her husband, a decision was made to proceed with neck exploration and parathyroidectomy. Patient presents today for surgery.   Past Medical History  Diagnosis Date  . ANXIETY 10/13/2007  . BACK PAIN 03/01/2007  . BREAST CANCER, HX OF 12/06/2007  . CONSTIPATION 01/06/2009  . DIVERTICULOSIS, COLON 05/11/2005  . External hemorrhoids without mention of complication 05/11/2005  . FATIGUE 08/07/2008  . HEMORRHOIDS, INTERNAL 05/11/2005  . Hypercalcemia 04/23/2009  . HYPERLIPIDEMIA NEC/NOS 03/01/2007  . HYPERTENSION 10/13/2007  . Impaired fasting glucose 11/14/2008  . INTERSTITIAL CYSTITIS 09/06/2007  . Irritable bowel syndrome 01/06/2009  . LIVER MASS 05/15/2009  . OSTEOPOROSIS 05/09/2009  . PARESTHESIA 09/06/2007  . POLYCYTHEMIA 04/23/2009  . STENOSIS, SPINAL, UNSPC REGION 03/01/2007  . Thyroid disease   . Diverticular disease   . Allergic     Anectine  . Complication of anesthesia     family aggergy to annectine  . DEPRESSION 10/13/2007  . Cancer     RIGHT mastectomy with node dissection     Current Facility-Administered Medications  Medication Dose Route Frequency Provider Last Rate Last Dose  . ceFAZolin (ANCEF) IVPB 2 g/50 mL premix  2 g Intravenous 60 min Pre-Op Velora Heckler, MD      . sodium chloride 0.9 % irrigation    PRN Velora Heckler, MD   1,000 mL at 06/29/11 1207    Facility-Administered Medications Ordered in Other Encounters  Medication Dose Route Frequency Provider Last Rate Last Dose  . acetaminophen (OFIRMEV) IVPB    PRN Elesa Massed   1,000 mg at 06/29/11 1202  . lactated ringers infusion    Continuous PRN Elesa Massed         Allergies  Allergen Reactions  . Decongest     Elevated blood pressure, "Doesn't work"  . Hydrocodone-Acetaminophen     REACTION: Hallucinations, paranoia, amnesia  . Iodine     REACTION: Hives, asthma  . Naproxen Sodium     REACTION: Asthma  . Other     PATIENT STATES THAT SHE CAN NOT TAKE GENERIC MEDICATIONS. She also can not take anti-depressants.  . Oxycodone Hcl     REACTION: Hallucinations, paranoia, amnesia  . Pentosan Polysulfate Sodium     REACTION: unspecified per patient  . Pseudoephedrine     REACTION: Palpitations  . Succinylcholine     REACTION: "Can't wake up".  . Tape Dermatitis    Red inflammed  . Tramadol Hcl     Pt allergic to ANY GENERIC Rx  . Vioxx (Rofecoxib)     "Doesn't work for pain"     Family History  Problem Relation Age of Onset  . Heart disease Sister   . Hypertension Sister   . Asthma Sister   . Cancer Maternal Aunt     breast  . Cancer Paternal Aunt     ovarian     History   Social History  . Marital Status:  Married    Spouse Name: N/A    Number of Children: N/A  . Years of Education: N/A   Social History Main Topics  . Smoking status: Former Smoker -- 1.0 packs/day for 42 years    Types: Cigarettes    Quit date: 08/23/1977  . Smokeless tobacco: Never Used  . Alcohol Use: No  . Drug Use: No  . Sexually Active: None   Other Topics Concern  . None   Social History Narrative  . None     REVIEW OF SYSTEMS - PERTINENT POSITIVES ONLY: Positive for spinal compression fracture, osteoporosis, fatigue   EXAM: Filed Vitals:   06/29/11 0953  BP: 114/71  Pulse: 103  Temp: 99.2 F (37.3 C)  Resp: 18    HEENT: normocephalic; pupils equal  and reactive; sclerae clear; dentition good; mucous membranes moist NECK:  No palpable masses; symmetric on extension; no palpable anterior or posterior cervical lymphadenopathy; no supraclavicular masses; no tenderness CHEST: clear to auscultation bilaterally without rales, rhonchi, or wheezes CARDIAC: regular rate and rhythm without significant murmur; peripheral pulses are full EXT:  non-tender without edema; no deformity NEURO: no gross focal deficits; no sign of tremor   LABORATORY RESULTS: See E-Chart for most recent results   RADIOLOGY RESULTS: See E-Chart or I-Site for most recent results   IMPRESSION: #1 primary hyperparathyroidism, likely parathyroid adenoma #2 thyroid nodules #3 osteoporosis   PLAN: Patient is seen and evaluated in the holding area at the operating room. She is scheduled to undergo neck exploration and parathyroidectomy at this time. Procedure is reviewed with the patient. Hospital course to be anticipated as reviewed with the patient area. She understands the plan of treatment and agrees to proceed.  Patient will be taken to the operating room at this time for neck exploration and parathyroidectomy. Routine postoperative care is anticipated.  The risks and benefits of the procedure have been discussed at length with the patient.  The patient understands the proposed procedure, potential alternative treatments, and the course of recovery to be expected.  All of the patient's questions have been answered at this time.  The patient wishes to proceed with surgery.   Velora Heckler, MD, FACS General & Endocrine Surgery [facility]      Visit Diagnoses: No diagnosis found.  Primary Care Physician: Carrie Mew, MD

## 2011-06-29 NOTE — Anesthesia Procedure Notes (Signed)
Date/Time: 06/29/2011 12:54 PM Performed by: Paulla Dolly A Laryngoscope Size: Mac and 4 Grade View: Grade III Tube type: Oral Tube size: 7.5 mm Number of attempts: 2 Airway Equipment and Method: bougie stylet Placement Confirmation: ETT inserted through vocal cords under direct vision,  positive ETCO2 and breath sounds checked- equal and bilateral Secured at: 21 cm Tube secured with: Tape Dental Injury: Teeth and Oropharynx as per pre-operative assessment  Difficulty Due To: Difficult Airway- due to anterior larynx and Difficult Airway- due to limited oral opening

## 2011-06-29 NOTE — Anesthesia Preprocedure Evaluation (Addendum)
Anesthesia Evaluation  Patient identified by MRN, date of birth, ID band Patient awake    Reviewed: Allergy & Precautions, H&P , NPO status , Patient's Chart, lab work & pertinent test results  History of Anesthesia Complications (+) AWARENESS UNDER ANESTHESIA, PSEUDOCHOLINESTERASE DEFICIENCY and Family history of anesthesia reaction  Airway Mallampati: II TM Distance: >3 FB     Dental  (+) Dental Advisory Given and Caps,    Pulmonary neg pulmonary ROS,    Pulmonary exam normal       Cardiovascular hypertension, + Carotid Bruit    Neuro/Psych PSYCHIATRIC DISORDERS Negative Neurological ROS     GI/Hepatic negative GI ROS, Neg liver ROS,   Endo/Other  Negative Endocrine ROS  Renal/GU  Bladder dysfunction  Interstitial cystitis    Musculoskeletal negative musculoskeletal ROS (+)   Abdominal Normal abdominal exam  (+)   Peds negative pediatric ROS (+)  Hematology negative hematology ROS (+)   Anesthesia Other Findings   Reproductive/Obstetrics negative OB ROS                          Anesthesia Physical Anesthesia Plan  ASA: II  Anesthesia Plan: General   Post-op Pain Management:    Induction: Intravenous  Airway Management Planned: Oral ETT  Additional Equipment:   Intra-op Plan:   Post-operative Plan: Extubation in OR  Informed Consent: I have reviewed the patients History and Physical, chart, labs and discussed the procedure including the risks, benefits and alternatives for the proposed anesthesia with the patient or authorized representative who has indicated his/her understanding and acceptance.   Dental advisory given  Plan Discussed with: CRNA  Anesthesia Plan Comments: (History of suspected Pseudocholinesterase deficiency/ avoid succinylcholine)        Anesthesia Quick Evaluation

## 2011-06-29 NOTE — Transfer of Care (Signed)
Immediate Anesthesia Transfer of Care Note  Patient: [name]  Procedure(s) Performed:  PARATHYROID EXPLORATION - Left Superior Parathyroidectomy  Patient Location: PACU  Anesthesia Type: General  Level of Consciousness: sedated  Airway & Oxygen Therapy: Patient Spontanous Breathing and Patient connected to face mask oxygen  Post-op Assessment: Report given to PACU RN and Post -op Vital signs reviewed and stable  Post vital signs: Reviewed and stable  Complications: No apparent anesthesia complications

## 2011-06-29 NOTE — Anesthesia Postprocedure Evaluation (Signed)
  Anesthesia Post-op Note  Patient: [name]  Procedure(s) Performed:  PARATHYROID EXPLORATION - Left Superior Parathyroidectomy  Patient Location: PACU  Anesthesia Type: General  Level of Consciousness: sedated  Airway and Oxygen Therapy: Patient Spontanous Breathing and Patient connected to face mask oxygen  Post-op Pain: none  Post-op Assessment: Post-op Vital signs reviewed  Post-op Vital Signs: Reviewed and stable  Complications: No apparent anesthesia complications

## 2011-06-30 ENCOUNTER — Other Ambulatory Visit: Payer: Self-pay | Admitting: Internal Medicine

## 2011-06-30 ENCOUNTER — Telehealth (INDEPENDENT_AMBULATORY_CARE_PROVIDER_SITE_OTHER): Payer: Self-pay

## 2011-06-30 LAB — BASIC METABOLIC PANEL
BUN: 10 mg/dL (ref 6–23)
CO2: 27 mEq/L (ref 19–32)
Chloride: 95 mEq/L — ABNORMAL LOW (ref 96–112)
Creatinine, Ser: 0.81 mg/dL (ref 0.50–1.10)
GFR calc Af Amer: 78 mL/min — ABNORMAL LOW (ref >90)
Glucose, Bld: 154 mg/dL — ABNORMAL HIGH (ref 70–99)

## 2011-06-30 MED ORDER — ATENOLOL 25 MG PO TABS
25.0000 mg | ORAL_TABLET | Freq: Once | ORAL | Status: AC
  Administered 2011-06-30: 25 mg via ORAL
  Filled 2011-06-30 (×2): qty 1

## 2011-06-30 NOTE — Discharge Summary (Signed)
Physician Discharge Summary  Patient ID: [name] MRN: [id number] DOB/AGE: 01/27/1930 77 y.o.  Admit date: 06/29/2011 Discharge date: 06/30/2011  Admission Diagnoses: primary hyperparathyroidism   Discharge Diagnoses: same Principal Problem:  *Hyperparathyroidism, primary   Discharged Condition: good  Hospital Course: Patient admitted on day of surgery.  To OR for neck exploration and left superior parathyroidectomy.  Post op course uneventful.  Stable on morning following surgery with calcium level decreased to normal level of 9.7.  Ready for discharge.  Consults: none  Significant Diagnostic Studies: labs: BMET  Treatments: IV hydration  Discharge Exam: Blood pressure 146/81, pulse 90, temperature 97.6 F (36.4 C), temperature source Oral, resp. rate 16, height 5\' 4"  (1.626 m), weight 180 lb 9.6 oz (81.92 kg), SpO2 96.00%. Neck wound clear and dry.  Voice normal.  Minimal soft tissue swelling.  Disposition:   Discharge Orders    Future Appointments: Provider: Department: Dept Phone: Center:   10/20/2011 11:00 AM Randall An, MD [facility] [phone] None     Future Orders Please Complete By Expires   Diet - low sodium heart healthy      Increase activity slowly      Discharge instructions      Comments:   CCS      [facility], [place] [phone]  THYROID/ PARATHYROID SURGERY: POST OP INSTRUCTIONS  Always review your discharge instruction sheet given to you by the facility where your surgery was performed.  IF YOU HAVE DISABILITY OR FAMILY LEAVE FORMS, YOU MUST BRING THEM TO THE OFFICE FOR PROCESSING.  PLEASE DO NOT GIVE THEM TO YOUR DOCTOR.  A prescription for pain medication may be given to you upon discharge.  Take your pain medication as prescribed, if needed.  If narcotic pain medicine is not needed, then you may take acetaminophen (Tylenol) or ibuprofen (Advil) as needed. Take your usually prescribed medications unless otherwise  directed. If you need a refill on your pain medication, please contact your pharmacy. They will contact our office to request authorization.  Prescriptions will not be filled after 5pm or on week-ends. You should follow a light diet the first 24 hours after arrival home, such as soup and crackers, etc.  Be sure to include lots of fluids daily.  Resume your normal diet the day after surgery. Most patients will experience some swelling and bruising on the chest and neck area.  Ice packs will help.  Swelling and bruising can take several days to resolve.  It is common to experience some constipation if taking pain medication after surgery.  Increasing fluid intake and taking a stool softener will usually help or prevent this problem from occurring.  A mild laxative (Milk of Magnesia or Miralax) should be taken according to package directions if there are no bowel movements after 48 hours. Unless discharge instructions indicate otherwise, you may remove your bandages 24-48 hours after surgery, and you may shower at that time.  You may have steri-strips (small skin tapes) in place directly over the incision.  These strips should be left on the skin for 7-10 days.  If your surgeon used skin glue on the incision, you may shower in 24 hours.  The glue will flake off over the next 2-3 weeks.  Any sutures or staples will be removed at the office during your follow-up visit. ACTIVITIES:  You may resume regular (light) daily activities beginning the next day-such as daily self-care, walking, climbing stairs-gradually increasing activities as tolerated.  You may have sexual intercourse  when it is comfortable.  Refrain from any heavy lifting or straining until approved by your doctor. You may drive when you no longer are taking prescription pain medication, you can comfortably wear a seatbelt, and you can safely maneuver your car and apply brakes RETURN TO WORK:   __________________________________________________________ [name] should see your doctor in the office for a follow-up appointment approximately two weeks after your surgery.  Make sure that you call for this appointment within a day or two after you arrive home to insure a convenient appointment time. OTHER INSTRUCTIONS: ____________________________________________________________________________ _________________________________________________________________________________________________________________ _________________________________________________________________________________________________________________   WHEN TO CALL YOUR DOCTOR: Fever over 101.0 Inability to urinate Nausea and/or vomiting Extreme swelling or bruising Continued bleeding from incision. Increased pain, redness, or drainage from the incision. Difficulty swallowing or breathing Muscle cramping or spasms. Numbness or tingling in hands or feet or around lips.  The clinic staff is available to answer your questions during regular business hours.  Please don't hesitate to call and ask to speak to one of the nurses if you have concerns.  For further questions, please visit [email or web address]   Remove dressing in 24 hours      Apply dressing      Comments:   Apply dry gauze dressing to neck before discharge     Current Discharge Medication List    CONTINUE these medications which have NOT CHANGED   Details  !! carisoprodol (SOMA) 250 MG tablet Take 250 mg by mouth 3 (three) times daily as needed. For muscle spasms.    !! carisoprodol (SOMA) 350 MG tablet Take 350 mg by mouth at bedtime.     cromolyn (NASALCROM) 5.2 MG/ACT nasal spray Place 2 sprays into the nose daily as needed. For allergies.     diazepam (VALIUM) 5 MG tablet Take 5 mg by mouth every 8 (eight) hours as needed. BRAND NAMES ONLY. For anxiety.    EDARBI 80 MG TABS Take 80 mg by mouth as needed.     Homeopathic Products  (CALENDULA) CREA Apply 1 application topically as needed. Applies to feet.    hydrOXYzine (VISTARIL) 25 MG capsule Take 25 mg by mouth at bedtime.     Menthol, Topical Analgesic, (SOMBRA COOL THERAPY) 6 % GEL Apply 1 application topically as needed. Applies to feet.    Methenamine-Sodium Salicylate (CYSTEX) 162-162.5 MG TABS Take 1 tablet by mouth daily as needed. For interstitial cystitis.    Multiple Vitamins-Minerals (MULTIVITAMINS THER. W/MINERALS) TABS Take 1 tablet by mouth every other day. She alternates with taking a Perfect Prenatal Multivitamin.     !! OVER THE COUNTER MEDICATION Take 1 capsule by mouth daily as needed. Barth Kirks' Water Balance Factor. For fluid if rings are too tight.    !! OVER THE COUNTER MEDICATION Take 1 tablet by mouth every other day. Perfect Prenatal Multivitamin. She alternates with taking a Multivitamin tablet.     !! OVER THE COUNTER MEDICATION Take 1 capsule by mouth daily. GI Flora. This contains Lactobacillus acidophilus, L. rhamnosus, L. casei, and Bifidobacterium longum.    !! OVER THE COUNTER MEDICATION Take by mouth. Tart Cherry Ultra. She takes two capsules in the morning and one capsule at bedtime. Hold while in hospital.    Polyethyl Glycol-Propyl Glycol (SYSTANE) 0.4-0.3 % SOLN Place 2 drops into both eyes as needed. For dry eyes.    progesterone (PROMETRIUM) 100 MG capsule Take 100 mg by mouth at bedtime.     Red Yeast Rice Extract 600 MG CAPS Take 600 mg by mouth  daily.     UNKNOWN TO PATIENT Calcium- pt unsure of strength- states not with Vitamin d     vitamin E 1000 UNIT capsule Take 1,000 Units by mouth daily.     aspirin 81 MG chewable tablet Chew 81 mg by mouth daily.     oxyCODONE-acetaminophen (PERCOCET) 5-325 MG per tablet Take 1 tablet by mouth daily as needed. Severe pain     !! - Potential duplicate medications found. Please discuss with provider.    STOP taking these medications     aspirin EC 81 MG tablet       Calcium Carbonate-Vitamin D (CALCIUM-VITAMIN D) 500-200 MG-UNIT per tablet      oxyCODONE-acetaminophen (PERCOCET) 10-325 MG per tablet      azithromycin (ZITHROMAX Z-PAK) 250 MG tablet          Signed: Jordy Hewins M 06/30/2011, 8:17 AM

## 2011-06-30 NOTE — Telephone Encounter (Signed)
Pt called in requesting a verbal ok for her to take a Z-pack Rx that Dr Lovell Sheehan prescribed for her last wk but she didn't take b/c having sx by Dr Gerrit Friends. I advised the pt that Dr Gerrit Friends had already left the office today and she needed to call Dr Ernie Avena office to ask him if it was ok for her to take the Z-pack since he is the one that prescribed it for her. The pt understands. [facility]

## 2011-06-30 NOTE — Progress Notes (Signed)
   LOS: 1 day   Subjective: Patient awake and alert.  No complaints.  Mild pain.  Normal voice.  Taking liquids.  Objective: Vital signs in last 24 hours: Temp:  [97.6 F (36.4 C)-99.2 F (37.3 C)] 97.6 F (36.4 C) (11/07 0701) Pulse Rate:  [89-136] 90  (11/07 0701) Resp:  [12-24] 16  (11/07 0701) BP: (114-193)/(71-104) 146/81 mmHg (11/07 0701) SpO2:  [93 %-99 %] 96 % (11/07 0701) FiO2 (%):  [2 %] 2 % (11/06 1640) Weight:  [180 lb 9.6 oz (81.92 kg)] 180 lb 9.6 oz (81.92 kg) (11/06 1640) Last BM Date: 06/28/11  Intake/Output from previous day: 11/06 0701 - 11/07 0700 In: 3600 [P.O.:1200; I.V.:2400] Out: 1350 [Urine:1300; Blood:50]  Exam: Neck wound clear and dry.  Normal voice.  Minimal soft tissue swelling.  Lab Results:  No results found for this basename: WBC:2,HGB:2,HCT:2,PLT:2 in the last 72 hours   Basename 06/30/11 0456  NA 133*  K 4.1  CL 95*  CO2 27  GLUCOSE 154*  BUN 10  CREATININE 0.81  CALCIUM 9.7    Studies/Results: No results found.  Anti-infectives     Start     Dose/Rate Route Frequency Ordered Stop   06/29/11 1100   ceFAZolin (ANCEF) IVPB 2 g/50 mL premix        2 g 100 mL/hr over 30 Minutes Intravenous 60 min pre-op 06/29/11 1610 06/29/11 1245          Assessment: S/p neck exploration and parathyroidectomy   Plan: D/c IVF Discharge to home.   Velora Heckler, MD, FACS General & Endocrine Surgery [facility]  06/30/2011

## 2011-06-30 NOTE — Progress Notes (Signed)
77 yo WF pt Dr Gerrit Friends POD#0 s/p Parathyroidectomy gauze and medipore tape to anterior neck c/d/i.  NT heard pt's tray roll quickly and went to room and noted pt sitting on the floor.  Pt unable to clearly articulate the events that lead to her fall.  Appears that pt was attempting to use her bedside table to assist her in getting out of bed.  Pt unable to state why she was trying to get out of bed.  Assisted pt back to bed.  VS taken.  Pt assessed.  No changes noted to assessment.  MD notified.  Received orders for Atenolol for elevated BP and HR.  No other orders given.  Hospital [facility] covering notified.  Unit AD covering notified.  Post Fall Checklist completed.  Will cont to monitor pt status.  C.K.Lorn Junes, Charity fundraiser.

## 2011-07-01 ENCOUNTER — Telehealth: Payer: Self-pay | Admitting: *Deleted

## 2011-07-01 NOTE — Telephone Encounter (Signed)
Pt has had her thyroid surgery, and wants to know whether to give it to her or not.

## 2011-07-01 NOTE — Telephone Encounter (Signed)
Pt's husband called and stated his wife had her parathyroid repaired on 06/29/11 and she was discharged on 06/30/11.  Pt was given zithromax on 06/24/11 .  Pt has a bad chest cold.  Pt's husband tried Social research officer, government but could not get him then he read the instructions on the medication and it stated that the pt should not take the meds.  Pt's husband would like to know what needs to be done.

## 2011-07-01 NOTE — Telephone Encounter (Signed)
Pt couldn't remember what to ask dr Lovell Sheehan- will call back if she remembers

## 2011-07-02 ENCOUNTER — Telehealth: Payer: Self-pay | Admitting: *Deleted

## 2011-07-02 NOTE — Telephone Encounter (Signed)
done

## 2011-07-02 NOTE — Telephone Encounter (Signed)
z pack was ordered prior to surgery and pt didn't understand she was to go ahead and take it----  ok to take for chest congestion

## 2011-07-07 ENCOUNTER — Telehealth: Payer: Self-pay | Admitting: Internal Medicine

## 2011-07-07 NOTE — Telephone Encounter (Signed)
mycelex troche refilled 1 time to [place] p[er dr Lovell Sheehan

## 2011-07-07 NOTE — Telephone Encounter (Signed)
Pt was given z pack which is causing her to have yeast inf in mouth and throat. [place] [phone]

## 2011-07-12 ENCOUNTER — Encounter (HOSPITAL_COMMUNITY): Payer: Self-pay | Admitting: Surgery

## 2011-07-21 ENCOUNTER — Encounter (INDEPENDENT_AMBULATORY_CARE_PROVIDER_SITE_OTHER): Payer: Self-pay | Admitting: Surgery

## 2011-07-22 ENCOUNTER — Ambulatory Visit (INDEPENDENT_AMBULATORY_CARE_PROVIDER_SITE_OTHER): Payer: Medicare Other | Admitting: Surgery

## 2011-07-22 ENCOUNTER — Encounter (INDEPENDENT_AMBULATORY_CARE_PROVIDER_SITE_OTHER): Payer: Self-pay | Admitting: Surgery

## 2011-07-22 VITALS — BP 136/84 | HR 64 | Temp 96.8°F | Resp 18 | Ht 64.0 in | Wt 175.5 lb

## 2011-07-22 DIAGNOSIS — E210 Primary hyperparathyroidism: Secondary | ICD-10-CM

## 2011-07-22 NOTE — Patient Instructions (Signed)
  COCOA BUTTER & VITAMIN E CREAM  (Palmer's or other brand)  Apply cocoa butter/vitamin E cream to your incision 2 - 3 times daily.  Massage cream into incision for one minute with each application.  Use sunscreen (50 SPF or higher) for first 6 months after surgery.  You may substitute Mederma or other scar reducing creams as desired.   

## 2011-07-22 NOTE — Progress Notes (Signed)
Visit Diagnoses: 1. Hyperparathyroidism, primary     HISTORY: Patient returns for her first postoperative visit having undergone neck exploration and parathyroidectomy. Final pathology showed an 8 mm parathyroid gland which appeared hypercellular. 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 Postoperative calcium level fell to 9.7 on the day following surgery.  Additional laboratory work will be obtained today.  EXAM: Surgical wound is healing nicely. Minimal soft tissue swelling. No sign of seroma. Voice quality is normal.  IMPRESSION: Primary hyperparathyroidism, status post neck exploration and parathyroidectomy  PLAN: Patient will have a calcium level drawn today at the laboratory. She will return in 6 weeks. We will obtain a calcium level and an intact PTH level prior to that office visit. Patient will begin applying topical creams to her incision.   Velora Heckler, MD, FACS General & Endocrine Surgery [facility]

## 2011-09-13 ENCOUNTER — Other Ambulatory Visit (INDEPENDENT_AMBULATORY_CARE_PROVIDER_SITE_OTHER): Payer: Self-pay | Admitting: Surgery

## 2011-09-15 ENCOUNTER — Ambulatory Visit (INDEPENDENT_AMBULATORY_CARE_PROVIDER_SITE_OTHER): Payer: Medicare Other | Admitting: Surgery

## 2011-09-15 ENCOUNTER — Encounter (INDEPENDENT_AMBULATORY_CARE_PROVIDER_SITE_OTHER): Payer: Self-pay | Admitting: Surgery

## 2011-09-15 VITALS — BP 168/92 | HR 70 | Temp 97.8°F | Resp 18 | Ht 64.0 in | Wt 178.8 lb

## 2011-09-15 DIAGNOSIS — E210 Primary hyperparathyroidism: Secondary | ICD-10-CM

## 2011-09-15 NOTE — Progress Notes (Signed)
Visit Diagnoses: 1. Hyperparathyroidism, primary     HISTORY: The patient returns for her final postoperative visit having undergone minimally invasive parathyroidectomy. Followup laboratory studies from September 15, 2011 showing normal PTH level of 45.9 and a normal serum calcium level of 9.9.  EXAM: Surgical wound is well-healed with an excellent cosmetic result. Voice quality is normal. On palpation there are no masses. There is no sign of infection.  IMPRESSION: Status post neck exploration and parathyroidectomy with normalization of parathyroid hormone level and serum calcium level  PLAN: Patient will continue applying topical creams her incision. She will return to see me as needed.  Velora Heckler, MD, FACS General & Endocrine Surgery [facility]

## 2011-09-15 NOTE — Patient Instructions (Signed)
Parathyroidectomy A parathyroidectomy is surgery to remove one or more parathyroid glands. These glands produce a hormone (parathyroid hormone) that helps control the level of calcium in your body. The glands are very small, about the size of a pea. They are located in your neck, close to your thyroid gland and your Adam's apple. Most people (85%) have four parathyroid glands,some people may have one or two more than that. Hyperparathyroidism is when too much parathyroid hormone is being produced. Usually this is caused by one of the parathyroid glands becoming enlarged, but it can also be caused by more than one of the glands. Hyperparathyroidism is found during blood tests that show high calcium in the blood. Parathyroid hormone levels will also be elevated. Cancer also can cause hyperparathyroidism, but this is rare. For the most common type of hyperparathyroidism, the treatment is surgical removal of the parathyroid gland that is enlarged. For patients with kidney failure and hyperparathyroidism, other treatment will be tried before surgery is done on the parathyroid.  Many times x-ray studies are done to find out which parathyroid gland or glands is malfunctioning. The decision about the best treatment for hyperparathyroidism is between the patient, their primary doctor, an endocrinologist, and a surgeon experienced in parathyroid surgery. LET YOUR CAREGIVER KNOW ABOUT:  Any allergies.   All medications you are taking, including:   Herbs, eyedrops, over-the-counter medications and creams.   Blood thinners (anticoagulants), aspirin or other drugs that could affect blood clotting.   Use of steroids (by mouth or as creams).   Previous problems with anesthetics, including local anesthetics.   Possibility of pregnancy, if this applies.   Any history of blood clots.   Any history of bleeding or other blood problems.   Previous surgery.   Smoking history.   Other health problems.  RISKS  AND COMPLICATIONS   Short-term possibilities include:   Excessive bleeding.   Pain.   Infection near the incision.   Slow healing.   Pooling of blood under the wound (hematoma).   Damage to nerves in your neck.   Blood clots.   Difficulty breathing. This is very rare. It also is almost always temporary.   Longer-term possibilities include:   Scarring.   Skin damage.   Damage to blood vessels in the area.   Need for additional surgery.   A hoarse or weak voice. This is usually temporary. It can be the result of nerve damage.   Development of hypoparathyroidism. This means you are not making enough parathyroid hormone. It is rare. If it occurs, you will need to take calcium supplements daily.  BEFORE THE PROCEDURE  Sometimes the surgery is done on an outpatient basis. This means you could go home the same day as your surgery. Other times, people need to stay in the hospital overnight. Ask your surgeon what you should expect.   If your surgery will be an outpatient procedure, arrange for someone to drive you home after the surgery.   Two weeks before your surgery, stop using aspirin and non-steroidal anti-inflammatory drugs (NSAID's) for pain relief. This includes prescription drugs and over-the-counter drugs such as ibuprofen and naproxen. Also stop taking vitamin E.   If you take blood-thinners, ask your healthcare provider when you should stop taking them.   Do not eat or drink for about 8 hours before your surgery.   You might be asked to shower or wash with a special antibacterial soap before the procedure.   Arrive at least an hour before the   surgery, or whenever your surgeon recommends. This will give you time to check in and fill out any needed paperwork.  PROCEDURE  The preparation:   You will change into a hospital gown.   You will be given an IV. A needle will be inserted in your arm. Medication will be able to flow directly into your body through this  needle.   You might be given a sedative to help you relax.   You will be given a drug that puts you to sleep during the surgery (general anesthetic).   The procedure:   Once you are asleep, the surgeon will make a small cut (incision) in your lower neck. Ask your surgeon where the incision will be.   The surgeon will look for the gland(s) that are not working well. Often a tissue sample from a gland is used to determine this.   Any glands that are not working well will be removed.   The surgeon will close the incision with stitches, often these are hidden under the skin.  AFTER THE PROCEDURE  You will stay in a recovery area until the anesthesia has worn off. Your blood pressure and heart rate will be checked.   If your surgery was an outpatient procedure, you will go home the same day.   If you need to stay in the hospital, you will be moved to a hospital room. You will probably stay for two to three days. This will depend on how quickly you recover.   While you are in the hospital, your blood will be tested to check the calcium levels in your body.  HOME CARE INSTRUCTIONS   Take any medication that your surgeon prescribes. Follow the directions carefully. Take all of the medication.   Ask your surgeon whether you can take over-the-counter medicines for pain, discomfort or fever. Do not take aspirin unless your healthcare provider says to. Aspirin increases the chances of bleeding.   Do not get the wound wet for the first few days after surgery (or until the surgeon tells you it is OK).  SEEK MEDICAL CARE IF:   You notice blood or fluid leaking from the wound, or it becomes red or swollen.   You have trouble breathing.   You have trouble speaking.   You become nauseous or throw up for more than two days after the surgery.   You develop a fever of more than 100.5 F (38.1 C).  SEEK IMMEDIATE MEDICAL CARE IF:   Breathing becomes more difficult.   You develop a fever of  102.0 F (38.9 C) or higher.  Document Released: 11/05/2008 Document Revised: 04/21/2011 Document Reviewed: 11/05/2008 Exit[employer] Patient Information 2012 [employer]. 

## 2011-09-20 ENCOUNTER — Encounter: Payer: Self-pay | Admitting: Family

## 2011-09-20 ENCOUNTER — Ambulatory Visit (INDEPENDENT_AMBULATORY_CARE_PROVIDER_SITE_OTHER): Payer: Medicare Other | Admitting: Family

## 2011-09-20 VITALS — BP 190/98 | Temp 98.7°F | Wt 179.0 lb

## 2011-09-20 DIAGNOSIS — S32000A Wedge compression fracture of unspecified lumbar vertebra, initial encounter for closed fracture: Secondary | ICD-10-CM

## 2011-09-20 DIAGNOSIS — S32009A Unspecified fracture of unspecified lumbar vertebra, initial encounter for closed fracture: Secondary | ICD-10-CM

## 2011-09-20 DIAGNOSIS — M545 Low back pain: Secondary | ICD-10-CM

## 2011-09-20 DIAGNOSIS — G8929 Other chronic pain: Secondary | ICD-10-CM

## 2011-09-20 NOTE — Patient Instructions (Signed)
Back Pain, Adult Low back pain is very common. About 1 in 5 people have back pain.The cause of low back pain is rarely dangerous. The pain often gets better over time.About half of people with a sudden onset of back pain feel better in just 2 weeks. About 8 in 10 people feel better by 6 weeks.  CAUSES Some common causes of back pain include:  Strain of the muscles or ligaments supporting the spine.   Wear and tear (degeneration) of the spinal discs.   Arthritis.   Direct injury to the back.  DIAGNOSIS Most of the time, the direct cause of low back pain is not known.However, back pain can be treated effectively even when the exact cause of the pain is unknown.Answering your caregiver's questions about your overall health and symptoms is one of the most accurate ways to make sure the cause of your pain is not dangerous. If your caregiver needs more information, he or she may order lab work or imaging tests (X-rays or MRIs).However, even if imaging tests show changes in your back, this usually does not require surgery. HOME CARE INSTRUCTIONS For many people, back pain returns.Since low back pain is rarely dangerous, it is often a condition that people can learn to manageon their own.   Remain active. It is stressful on the back to sit or stand in one place. Do not sit, drive, or stand in one place for more than 30 minutes at a time. Take short walks on level surfaces as soon as pain allows.Try to increase the length of time you walk each day.   Do not stay in bed.Resting more than 1 or 2 days can delay your recovery.   Do not avoid exercise or work.Your body is made to move.It is not dangerous to be active, even though your back may hurt.Your back will likely heal faster if you return to being active before your pain is gone.   Pay attention to your body when you bend and lift. Many people have less discomfortwhen lifting if they bend their knees, keep the load close to their  bodies,and avoid twisting. Often, the most comfortable positions are those that put less stress on your recovering back.   Find a comfortable position to sleep. Use a firm mattress and lie on your side with your knees slightly bent. If you lie on your back, put a pillow under your knees.   Only take over-the-counter or prescription medicines as directed by your caregiver. Over-the-counter medicines to reduce pain and inflammation are often the most helpful.Your caregiver may prescribe muscle relaxant drugs.These medicines help dull your pain so you can more quickly return to your normal activities and healthy exercise.   Put ice on the injured area.   Put ice in a plastic bag.   Place a towel between your skin and the bag.   Leave the ice on for 15 to 20 minutes, 3 to 4 times a day for the first 2 to 3 days. After that, ice and heat may be alternated to reduce pain and spasms.   Ask your caregiver about trying back exercises and gentle massage. This may be of some benefit.   Avoid feeling anxious or stressed.Stress increases muscle tension and can worsen back pain.It is important to recognize when you are anxious or stressed and learn ways to manage it.Exercise is a great option.  SEEK MEDICAL CARE IF:  You have pain that is not relieved with rest or medicine.   You have   pain that does not improve in 1 week.   You have new symptoms.   You are generally not feeling well.  SEEK IMMEDIATE MEDICAL CARE IF:   You have pain that radiates from your back into your legs.   You develop new bowel or bladder control problems.   You have unusual weakness or numbness in your arms or legs.   You develop nausea or vomiting.   You develop abdominal pain.   You feel faint.  Document Released: 08/09/2005 Document Revised: 04/21/2011 Document Reviewed: 12/28/2010 Exit[employer] Patient Information 2012 [employer]. 

## 2011-09-20 NOTE — Progress Notes (Signed)
  Subjective:    Patient ID: [name], female    DOB: [birth year], 78 y.o.   MRN: [id number]  HPI And 78 year old white female, nonsmoker, patient of Dr. Lovell Sheehan is in with complaints of chronic low back pain. She originally developed chronic low back pain in 2000. She had surgery in 2007 that relieved a significant amount of low back pain. Since, she developed a compression fracture of L5 and saw a doctor in [place] that told her she had to wait a year to let the fracture heal, had another MRI done, and return for possible laser surgery. Today, she rates her pain as 7-8/10. The pain is primarily in the lower back, buttocks, and feet. The pain is worse with weather changes particularly cold weather and with movement. The pain is better with pain medications, physical therapy and exercise. He described the pain as a burning sensation with pins and needles that radiates to her lower back, to her buttocks, to her great toe bilaterally.   Review of Systems  Constitutional: Negative.   Respiratory: Negative.   Cardiovascular: Negative.   Genitourinary: Negative.   Musculoskeletal: Positive for back pain and arthralgias.       Low back pain, bilateral leg pain, pain in buttocks.   Skin: Negative.   Neurological: Positive for speech difficulty.       Sharp, burning pain from lower back to big toe.   Hematological: Negative.   Psychiatric/Behavioral: Negative.        Objective:   Physical Exam  Constitutional: She is oriented to person, place, and time. She appears well-developed and well-nourished.  Neck: Normal range of motion. Neck supple.  Cardiovascular: Normal rate, regular rhythm and normal heart sounds.   Pulmonary/Chest: Effort normal and breath sounds normal.  Abdominal: Soft. Bowel sounds are normal.  Musculoskeletal: Normal range of motion.  Neurological: She is alert and oriented to person, place, and time.  Skin: Skin is warm and dry.  Psychiatric: She has a  normal mood and affect.          Assessment & Plan:  Assessment: Chronic low back pain, compression fracture L5  Plan: MRI of the L-spine to be done. Continue current medication regimen. Continue current exercise regimen. Will notify patient of results. Patient's blood pressure is elevated today, she reports that she could go home and take medication to get that down. She is not currently on blood pressure medication, so we need to closely monitor her blood pressure. Encouraged low-sodium diet. Patient to call the office if symptoms worsen or persist. Her recheck with Dr. Lovell Sheehan for her blood pressure. In sooner when necessary.

## 2011-09-24 ENCOUNTER — Other Ambulatory Visit: Payer: Self-pay

## 2011-09-24 DIAGNOSIS — G8929 Other chronic pain: Secondary | ICD-10-CM

## 2011-10-08 ENCOUNTER — Telehealth: Payer: Self-pay | Admitting: Internal Medicine

## 2011-10-08 NOTE — Telephone Encounter (Signed)
Per dr Lovell Sheehan that was a physical in september

## 2011-10-08 NOTE — Telephone Encounter (Signed)
Pt stated 04-2011 was not a cpx. Pt is requesting cpx soon. Pt stated her hus richard had a complete cpx.
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
## 2011-10-20 ENCOUNTER — Encounter (HOSPITAL_COMMUNITY): Payer: Medicare Other | Attending: Oncology | Admitting: Oncology

## 2011-10-20 ENCOUNTER — Encounter (HOSPITAL_COMMUNITY): Payer: Self-pay | Admitting: Oncology

## 2011-10-20 VITALS — BP 166/87 | HR 111 | Temp 97.6°F | Ht 64.0 in | Wt 178.7 lb

## 2011-10-20 DIAGNOSIS — M545 Low back pain, unspecified: Secondary | ICD-10-CM | POA: Insufficient documentation

## 2011-10-20 DIAGNOSIS — K7689 Other specified diseases of liver: Secondary | ICD-10-CM | POA: Insufficient documentation

## 2011-10-20 DIAGNOSIS — Z09 Encounter for follow-up examination after completed treatment for conditions other than malignant neoplasm: Secondary | ICD-10-CM | POA: Insufficient documentation

## 2011-10-20 DIAGNOSIS — G8929 Other chronic pain: Secondary | ICD-10-CM | POA: Insufficient documentation

## 2011-10-20 DIAGNOSIS — M810 Age-related osteoporosis without current pathological fracture: Secondary | ICD-10-CM | POA: Insufficient documentation

## 2011-10-20 DIAGNOSIS — E669 Obesity, unspecified: Secondary | ICD-10-CM | POA: Insufficient documentation

## 2011-10-20 DIAGNOSIS — Z853 Personal history of malignant neoplasm of breast: Secondary | ICD-10-CM

## 2011-10-20 DIAGNOSIS — M542 Cervicalgia: Secondary | ICD-10-CM | POA: Insufficient documentation

## 2011-10-20 LAB — DIFFERENTIAL
Basophils Relative: 1 % (ref 0–1)
Lymphs Abs: 2.8 10*3/uL (ref 0.7–4.0)
Monocytes Absolute: 0.4 10*3/uL (ref 0.1–1.0)
Monocytes Relative: 5 % (ref 3–12)
Neutro Abs: 4.3 10*3/uL (ref 1.7–7.7)

## 2011-10-20 LAB — COMPREHENSIVE METABOLIC PANEL
Albumin: 4.3 g/dL (ref 3.5–5.2)
BUN: 13 mg/dL (ref 6–23)
Chloride: 102 mEq/L (ref 96–112)
Creatinine, Ser: 0.62 mg/dL (ref 0.50–1.10)
GFR calc Af Amer: >90 mL/min (ref >90)
GFR calc non Af Amer: 84 mL/min — ABNORMAL LOW (ref >90)
Glucose, Bld: 136 mg/dL — ABNORMAL HIGH (ref 70–99)
Total Bilirubin: 0.4 mg/dL (ref 0.3–1.2)

## 2011-10-20 LAB — CBC
HCT: 46.1 % — ABNORMAL HIGH (ref 36.0–46.0)
Hemoglobin: 16 g/dL — ABNORMAL HIGH (ref 12.0–15.0)
MCH: 30.5 pg (ref 26.0–34.0)
MCHC: 34.7 g/dL (ref 30.0–36.0)

## 2011-10-20 LAB — CANCER ANTIGEN 27.29: CA 27.29: 27 U/mL (ref 0–39)

## 2011-10-20 NOTE — Progress Notes (Signed)
This office note has been dictated.

## 2011-10-20 NOTE — Patient Instructions (Signed)
JAMEE KEACH  [id number] 21-Mar-1930   [facility]  Discharge Instructions  RECOMMENDATIONS MADE BY THE CONSULTANT AND ANY TEST RESULTS WILL BE SENT TO YOUR REFERRING DOCTOR.   EXAM FINDINGS BY MD TODAY AND SIGNS AND SYMPTOMS TO REPORT TO CLINIC OR PRIMARY MD: You are doing well.  Report any lumps, bone pain or shortness of breath.  MEDICATIONS PRESCRIBED: none      SPECIAL INSTRUCTIONS/FOLLOW-UP: Return to Clinic in 1 year.   I acknowledge that I have been informed and understand all the instructions given to me and received a copy. I do not have any more questions at this time, but understand that I may call the Specialty Clinic at [facility] at [phone] during business hours should I have any further questions or need assistance in obtaining follow-up care.    __________________________________________  _____________  __________ Signature of Patient or Authorized Representative            Date                   Time    __________________________________________ Nurse's Signature

## 2011-10-20 NOTE — Progress Notes (Signed)
[name] presented for [employer]. Labs per MD order drawn via Peripheral Line 23 gauge needle inserted in left AC  Good blood return present. Procedure without incident.  Needle removed intact. Patient tolerated procedure well.
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
## 2011-10-28 ENCOUNTER — Telehealth (HOSPITAL_COMMUNITY): Payer: Self-pay

## 2011-11-09 ENCOUNTER — Telehealth: Payer: Self-pay | Admitting: *Deleted

## 2011-11-09 NOTE — Telephone Encounter (Signed)
Pt is calling complaining of pain under left breast and ribs since yesterday.  She feels it may be indigestion, but does have history of heart disease in her family.  Pain does not hurt when coughs or moves.  No SOB, sweating or jaw or arm pain.  Has had this similar pain for years, but was told to always have it checked out. History of heart murmur.

## 2011-11-09 NOTE — Telephone Encounter (Signed)
Notified pt. 

## 2011-11-09 NOTE — Telephone Encounter (Signed)
When pain occurs take 2 tbls of maalox ,if  pain goes away,she doesn't need to be evaluated, but if maalox doesn't  Help, she will need to come in and see padonda and have ekg per dr Lovell Sheehan

## 2011-12-20 ENCOUNTER — Telehealth: Payer: Self-pay | Admitting: Oncology

## 2011-12-20 NOTE — Telephone Encounter (Signed)
Talked to pt gave her appt for 12/28/11 chemo class and as a New Patient on 12/31/11. Pt aware of all appts.

## 2011-12-28 ENCOUNTER — Other Ambulatory Visit: Payer: Medicare Other

## 2011-12-31 ENCOUNTER — Ambulatory Visit: Payer: Medicare Other

## 2011-12-31 ENCOUNTER — Ambulatory Visit: Payer: Medicare Other | Admitting: Oncology

## 2011-12-31 ENCOUNTER — Other Ambulatory Visit: Payer: Medicare Other | Admitting: Lab

## 2012-01-05 ENCOUNTER — Ambulatory Visit: Payer: Medicare Other | Admitting: Oncology

## 2012-01-21 ENCOUNTER — Telehealth: Payer: Self-pay | Admitting: Internal Medicine

## 2012-01-21 NOTE — Telephone Encounter (Signed)
appt with dr Kirtland Bouchard on 6-24 at 11am

## 2012-01-21 NOTE — Telephone Encounter (Signed)
We have opening on June 21- if that is not convenient she will have to see another pcp

## 2012-01-21 NOTE — Telephone Encounter (Signed)
Pt was offered June 21 she declined. Pt stated it must be with in 2 wks. Pt decline to see another provider

## 2012-01-21 NOTE — Telephone Encounter (Addendum)
Pt had back surgery in [place] on 01-14-2012. Pt is requesting appt with Dr Lovell Sheehan only with in 2 wks. Surgeon in [place] will fax reports etc

## 2012-01-31 ENCOUNTER — Telehealth: Payer: Self-pay | Admitting: Family Medicine

## 2012-01-31 NOTE — Telephone Encounter (Signed)
Pulled from Triage vmail. Husband called at 11:46. States that wife had nerve conduction study done and [employer]. They were unable to get the results before they left [place] to come back to [place]. Pt called down there last week to inquire on this, and [facility] said [facility] would fax it to this office. Husband wants to know if we've received it. If we do not receive by end of day today, husband wants [place] to call [facility] at [phone].

## 2012-02-01 NOTE — Telephone Encounter (Signed)
Left message on machine Papers never arrived

## 2012-02-02 ENCOUNTER — Encounter: Payer: Self-pay | Admitting: Internal Medicine

## 2012-02-02 ENCOUNTER — Ambulatory Visit (INDEPENDENT_AMBULATORY_CARE_PROVIDER_SITE_OTHER): Payer: Medicare Other | Admitting: Internal Medicine

## 2012-02-02 VITALS — BP 136/78 | HR 76 | Temp 98.3°F | Resp 16 | Ht 64.0 in | Wt 177.0 lb

## 2012-02-02 DIAGNOSIS — M549 Dorsalgia, unspecified: Secondary | ICD-10-CM

## 2012-02-02 DIAGNOSIS — S239XXA Sprain of unspecified parts of thorax, initial encounter: Secondary | ICD-10-CM

## 2012-02-02 DIAGNOSIS — T887XXA Unspecified adverse effect of drug or medicament, initial encounter: Secondary | ICD-10-CM

## 2012-02-02 DIAGNOSIS — M4800 Spinal stenosis, site unspecified: Secondary | ICD-10-CM

## 2012-02-02 DIAGNOSIS — Z853 Personal history of malignant neoplasm of breast: Secondary | ICD-10-CM

## 2012-02-02 LAB — HEPATIC FUNCTION PANEL
ALT: 32 U/L (ref 0–35)
Albumin: 4.6 g/dL (ref 3.5–5.2)
Total Protein: 7.7 g/dL (ref 6.0–8.3)

## 2012-02-02 LAB — BASIC METABOLIC PANEL
BUN: 10 mg/dL (ref 6–23)
CO2: 29 mEq/L (ref 19–32)
Chloride: 102 mEq/L (ref 96–112)
Creatinine, Ser: 0.9 mg/dL (ref 0.4–1.2)

## 2012-02-02 NOTE — Patient Instructions (Addendum)
The patient is instructed to continue all medications as prescribed. Schedule followup with check out clerk upon leaving the clinic  May resume the acupuncture as long and it does not involve the spine   May resume baths this Friday.

## 2012-02-02 NOTE — Progress Notes (Signed)
  Subjective:    Patient ID: [name], female    DOB: August 18, 1930, 78 y.o.   MRN: [id number]  HPI The pt has surgery with a laser at a spine center in [place] The pt had an epidural, laser procedure to facet iinnervation The pt has been going to integrative therapy and wants to resume PT. She has a hx of using accupucture    Review of Systems  Constitutional: Negative for activity change, appetite change and fatigue.  HENT: Negative for ear pain, congestion, neck pain, postnasal drip and sinus pressure.   Eyes: Negative for redness and visual disturbance.  Respiratory: Negative for cough, shortness of breath and wheezing.   Gastrointestinal: Negative for abdominal pain and abdominal distention.  Genitourinary: Negative for dysuria, frequency and menstrual problem.  Musculoskeletal: Negative for myalgias, joint swelling and arthralgias.  Skin: Negative for rash and wound.  Neurological: Negative for dizziness, weakness and headaches.  Hematological: Negative for adenopathy. Does not bruise/bleed easily.  Psychiatric/Behavioral: Negative for disturbed wake/sleep cycle and decreased concentration.       Objective:   Physical Exam  Nursing note and vitals reviewed. Constitutional: She is oriented to person, place, and time. She appears well-developed and well-nourished. No distress.  HENT:  Head: Normocephalic and atraumatic.  Right Ear: External ear normal.  Left Ear: External ear normal.  Nose: Nose normal.  Mouth/Throat: Oropharynx is clear and moist.  Eyes: Conjunctivae and EOM are normal. Pupils are equal, round, and reactive to light.  Neck: Normal range of motion. Neck supple. No JVD present. No tracheal deviation present. No thyromegaly present.  Cardiovascular: Normal rate, regular rhythm, normal heart sounds and intact distal pulses.   No murmur heard. Pulmonary/Chest: Effort normal and breath sounds normal. She has no wheezes. She exhibits no tenderness.    Abdominal: Soft. Bowel sounds are normal.  Musculoskeletal: Normal range of motion. She exhibits no edema and no tenderness.  Lymphadenopathy:    She has no cervical adenopathy.  Neurological: She is alert and oriented to person, place, and time. She has normal reflexes. No cranial nerve deficit.  Skin: Skin is warm and dry. She is not diaphoretic.  Psychiatric: She has a normal mood and affect. Her behavior is normal.          Assessment & Plan:  Post laser surgery .... The patient has noted increased relief but still has some pain ans wants to resume acupuncture The pt has been given pain meds and flexeril. reviewed fall risks and prevention cautioned patient about excessive activity following her laser surgery as she should follow carefully instructions she was given from the surgical Center as far as physical therapy she should only do physical therapy when released by her surgeon  .

## 2012-02-04 ENCOUNTER — Ambulatory Visit: Payer: Medicare Other | Admitting: Internal Medicine

## 2012-02-04 ENCOUNTER — Telehealth: Payer: Self-pay | Admitting: Internal Medicine

## 2012-02-04 NOTE — Telephone Encounter (Signed)
Pt called stating that Dr. Lovell Sheehan requested her to call with this information: The drug name is Detrol-La Pt would not go into detail. Please contact pt with any questions

## 2012-02-04 NOTE — Telephone Encounter (Signed)
Added to med list

## 2012-05-08 ENCOUNTER — Ambulatory Visit: Payer: Medicare Other | Admitting: Internal Medicine

## 2012-06-01 ENCOUNTER — Emergency Department (HOSPITAL_COMMUNITY)
Admission: EM | Admit: 2012-06-01 | Discharge: 2012-06-02 | Disposition: A | Discharge status: Home / Self Care | Payer: Medicare Other | Attending: Emergency Medicine | Admitting: Emergency Medicine

## 2012-06-01 ENCOUNTER — Encounter (HOSPITAL_COMMUNITY): Payer: Self-pay | Admitting: *Deleted

## 2012-06-01 DIAGNOSIS — E785 Hyperlipidemia, unspecified: Secondary | ICD-10-CM | POA: Insufficient documentation

## 2012-06-01 DIAGNOSIS — M810 Age-related osteoporosis without current pathological fracture: Secondary | ICD-10-CM | POA: Insufficient documentation

## 2012-06-01 DIAGNOSIS — K589 Irritable bowel syndrome without diarrhea: Secondary | ICD-10-CM | POA: Insufficient documentation

## 2012-06-01 DIAGNOSIS — Z853 Personal history of malignant neoplasm of breast: Secondary | ICD-10-CM | POA: Insufficient documentation

## 2012-06-01 DIAGNOSIS — I10 Essential (primary) hypertension: Secondary | ICD-10-CM

## 2012-06-01 DIAGNOSIS — Z87891 Personal history of nicotine dependence: Secondary | ICD-10-CM | POA: Insufficient documentation

## 2012-06-01 DIAGNOSIS — E079 Disorder of thyroid, unspecified: Secondary | ICD-10-CM | POA: Insufficient documentation

## 2012-06-01 NOTE — ED Notes (Signed)
Pt had acupuncture today; was told her bp was elevated; checked at home to be in 190's; c/o head fullness

## 2012-06-01 NOTE — ED Provider Notes (Signed)
History    78 year old female with hypertension. Patient went to receive acupuncture today. Her acupuncturist told her that she could tell her blood pressure was high because of the way her voice sounded.  Pt then went home and took her blood pressure and was noted to be in the 190s systolic. Patient with no acute complaints. Denies chest pain or shortness of breath. No unusual swelling. No urinary complaints. No significant headache. Patient reports a history of hypertension but is not currently on medication for it.  CSN: [id number]  Arrival date & time 06/01/12  2220   First MD Initiated Contact with Patient 06/01/12 2246      Chief Complaint  Patient presents with  . Hypertension    (Consider location/radiation/quality/duration/timing/severity/associated sxs/prior treatment) HPI  Past Medical History  Diagnosis Date  . ANXIETY 10/13/2007  . BACK PAIN 03/01/2007  . BREAST CANCER, HX OF 12/06/2007  . CONSTIPATION 01/06/2009  . DIVERTICULOSIS, COLON 05/11/2005  . External hemorrhoids without mention of complication 05/11/2005  . FATIGUE 08/07/2008  . HEMORRHOIDS, INTERNAL 05/11/2005  . Hypercalcemia 04/23/2009  . HYPERLIPIDEMIA NEC/NOS 03/01/2007  . HYPERTENSION 10/13/2007  . Impaired fasting glucose 11/14/2008  . INTERSTITIAL CYSTITIS 09/06/2007  . Irritable bowel syndrome 01/06/2009  . LIVER MASS 05/15/2009  . OSTEOPOROSIS 05/09/2009  . PARESTHESIA 09/06/2007  . POLYCYTHEMIA 04/23/2009  . STENOSIS, SPINAL, UNSPC REGION 03/01/2007  . Thyroid disease   . Diverticular disease   . Allergic     Anectine  . Complication of anesthesia     family aggergy to annectine  . DEPRESSION 10/13/2007  . Cancer     RIGHT mastectomy with node dissection    Past Surgical History  Procedure Date  . Back surgery   . Cholecystectomy   . Abdominal hysterectomy   . Breast surgery     mastectomy -right  . Cervical spine surgery   . Fixation kyphoplasty lumbar spine   . Parathyroid exploration 06/29/2011      Procedure: PARATHYROID EXPLORATION;  Surgeon: Velora Heckler, MD;  Location: [facility];  Service: General;  Laterality: Left;  Left Superior Parathyroidectomy  . Parathyroidectomy 06/29/11    Family History  Problem Relation Age of Onset  . Heart disease Sister   . Hypertension Sister   . Asthma Sister   . Cancer Maternal Aunt     breast  . Cancer Paternal Aunt     ovarian    History  Substance Use Topics  . Smoking status: Former Smoker -- 1.0 packs/day for 42 years    Types: Cigarettes    Quit date: 08/23/1977  . Smokeless tobacco: Never Used  . Alcohol Use: No    OB History    Grav Para Term Preterm Abortions TAB SAB Ect Mult Living                  Review of Systems   Review of symptoms negative unless otherwise noted in HPI.   Allergies  Decongest; Hydrocodone-acetaminophen; Iodine; Naproxen sodium; Other; Oxycodone hcl; Pentosan polysulfate sodium; Pseudoephedrine; Succinylcholine; Tape; Tramadol hcl; and Vioxx  Home Medications   Current Outpatient Rx  Name Route Sig Dispense Refill  . ASPIRIN 81 MG PO CHEW Oral Chew 81 mg by mouth daily.     Marland Kitchen CALCIUM CARBONATE 600 MG PO TABS Oral Take 600 mg by mouth daily.    Marland Kitchen CARISOPRODOL 250 MG PO TABS Oral Take 250 mg by mouth 3 (three) times daily as needed. For muscle spasms.    Marland Kitchen VITAMIN  D 1000 UNITS PO TABS Oral Take 1,000 Units by mouth daily.    Marland Kitchen DIAZEPAM 5 MG PO TABS Oral Take 5-10 mg by mouth every 8 (eight) hours as needed. BRAND NAMES ONLY. For anxiety.    Marland Kitchen DIFLUPREDNATE 0.05 % OP EMUL Both Eyes Place 1 drop into both eyes 4 (four) times daily. 4 times left eye, 2 times right eye    . EDARBI 80 MG PO TABS Oral Take 80 mg by mouth daily as needed. For blood pressure spikes.    Marland Kitchen CALENDULA EX CREA Apply externally Apply 1 application topically as needed. Applies to feet.    Marland Kitchen HYDROXYZINE PAMOATE 25 MG PO CAPS Oral Take 25 mg by mouth at bedtime.     Marland Kitchen MENTHOL (TOPICAL ANALGESIC) 6 % EX GEL Apply externally  Apply 1 application topically as needed. Applies to feet.    Marland Kitchen MOXIFLOXACIN HCL 0.5 % OP SOLN Both Eyes Place 1 drop into both eyes 4 (four) times daily. 4 times daily on left side, 2 times daily on right side.    Carma Leaven M PLUS PO TABS Oral Take 1 tablet by mouth every other day. She alternates with taking a Perfect Prenatal Multivitamin.    Marland Kitchen OVER THE COUNTER MEDICATION Oral Take 1 capsule by mouth daily as needed. Barth Kirks' Water Balance Factor. For fluid if rings are too tight.    Marland Kitchen OVER THE COUNTER MEDICATION Oral Take 1 tablet by mouth every other day. Perfect Prenatal Multivitamin. She alternates with taking a Multivitamin tablet.     Marland Kitchen OVER THE COUNTER MEDICATION Oral Take 1 capsule by mouth daily. GI Flora. This contains Lactobacillus acidophilus, L. rhamnosus, L. casei, and Bifidobacterium longum.    . OXYCODONE-ACETAMINOPHEN 5-325 MG PO TABS Oral Take 1 tablet by mouth daily as needed. Severe pain    . POLYETHYL GLYCOL-PROPYL GLYCOL 0.4-0.3 % OP SOLN Both Eyes Place 2 drops into both eyes as needed. For dry eyes.    Marland Kitchen PROGESTERONE MICRONIZED 100 MG PO CAPS Oral Take 100 mg by mouth at bedtime.     . RED YEAST RICE EXTRACT 600 MG PO CAPS Oral Take 600 mg by mouth daily.     . TOLTERODINE TARTRATE ER 2 MG PO CP24 Oral Take 2 mg by mouth daily.    Marland Kitchen VITAMIN E 1000 UNITS PO CAPS Oral Take 1,000 Units by mouth daily.     . RECLAST IV Intravenous Inject into the vein.       BP 197/102  Pulse 113  Temp 98.6 F (37 C) (Oral)  Resp 16  SpO2 95%  Physical Exam  Nursing note and vitals reviewed. Constitutional: She appears well-developed and well-nourished. No distress.       Laying in bed. No acute distress.  HENT:  Head: Normocephalic and atraumatic.  Eyes: Conjunctivae normal are normal. Right eye exhibits no discharge. Left eye exhibits no discharge.  Neck: Neck supple.  Cardiovascular: Normal rate, regular rhythm and normal heart sounds.  Exam reveals no gallop and no friction rub.    No murmur heard. Pulmonary/Chest: Effort normal and breath sounds normal. No respiratory distress.  Abdominal: Soft. She exhibits no distension. There is no tenderness.  Musculoskeletal: She exhibits no edema and no tenderness.          Neurological: She is alert.  Skin: Skin is warm and dry.  Psychiatric: She has a normal mood and affect. Her behavior is normal. Thought content normal.    ED Course  Procedures (including critical care time)  Labs Reviewed - No data to display No results found.   1. Hypertension       MDM  78 year old female with asymptomatic hypertension. No indication for emergent blood pressure reduction. Patient's sodium and renal function is okay. Will start her on HCTZ. Patient does have a PCP whom she can followup with. Understands the need for followup and that she may need to be on additional medications or that her PCP may prefer a different agent.         Raeford Razor, MD 06/02/12 [phone]

## 2012-06-02 LAB — BASIC METABOLIC PANEL
BUN: 14 mg/dL (ref 6–23)
CO2: 25 mEq/L (ref 19–32)
Calcium: 9 mg/dL (ref 8.4–10.5)
Chloride: 101 mEq/L (ref 96–112)
Creatinine, Ser: 0.69 mg/dL (ref 0.50–1.10)
GFR calc Af Amer: >90 mL/min (ref >90)
GFR calc non Af Amer: 80 mL/min — ABNORMAL LOW (ref >90)
Glucose, Bld: 125 mg/dL — ABNORMAL HIGH (ref 70–99)
Potassium: 4.1 mEq/L (ref 3.5–5.1)
Sodium: 137 mEq/L (ref 135–145)

## 2012-06-02 MED ORDER — HYDROCHLOROTHIAZIDE 25 MG PO TABS: 25.0000 mg | ORAL_TABLET | Freq: Every day | ORAL | Status: DC

## 2012-06-13 ENCOUNTER — Emergency Department (INDEPENDENT_AMBULATORY_CARE_PROVIDER_SITE_OTHER)
Admission: EM | Admit: 2012-06-13 | Discharge: 2012-06-13 | Disposition: A | Discharge status: Home / Self Care | Payer: Medicare Other | Source: Home / Self Care | Attending: Family Medicine | Admitting: Family Medicine

## 2012-06-13 ENCOUNTER — Emergency Department (INDEPENDENT_AMBULATORY_CARE_PROVIDER_SITE_OTHER): Payer: Medicare Other

## 2012-06-13 ENCOUNTER — Encounter (HOSPITAL_COMMUNITY): Payer: Self-pay | Admitting: *Deleted

## 2012-06-13 DIAGNOSIS — S20219A Contusion of unspecified front wall of thorax, initial encounter: Secondary | ICD-10-CM
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
## 2012-06-13 NOTE — ED Provider Notes (Cosign Needed)
History     CSN: [id number]  Arrival date & time 06/13/12  1057   First MD Initiated Contact with Patient 06/13/12 1200      Chief Complaint  Patient presents with  . Fall    (Consider location/radiation/quality/duration/timing/severity/associated sxs/prior treatment) Patient is a 78 y.o. female presenting with fall. The history is provided by the patient and the spouse.  Fall The accident occurred 3 to 5 hours ago. Incident: fell getting out of bed tending to dog this am. She landed on carpet. Point of impact: left chest. Pain location: left chest. The pain is mild. She was ambulatory at the scene. There was no entrapment after the fall. There was no drug use involved in the accident. Pertinent negatives include no numbness, no abdominal pain, no nausea, no vomiting, no headaches, no loss of consciousness and no tingling.    Past Medical History  Diagnosis Date  . ANXIETY 10/13/2007  . BACK PAIN 03/01/2007  . BREAST CANCER, HX OF 12/06/2007  . CONSTIPATION 01/06/2009  . DIVERTICULOSIS, COLON 05/11/2005  . External hemorrhoids without mention of complication 05/11/2005  . FATIGUE 08/07/2008  . HEMORRHOIDS, INTERNAL 05/11/2005  . Hypercalcemia 04/23/2009  . HYPERLIPIDEMIA NEC/NOS 03/01/2007  . HYPERTENSION 10/13/2007  . Impaired fasting glucose 11/14/2008  . INTERSTITIAL CYSTITIS 09/06/2007  . Irritable bowel syndrome 01/06/2009  . LIVER MASS 05/15/2009  . OSTEOPOROSIS 05/09/2009  . PARESTHESIA 09/06/2007  . POLYCYTHEMIA 04/23/2009  . STENOSIS, SPINAL, UNSPC REGION 03/01/2007  . Thyroid disease   . Diverticular disease   . Allergic     Anectine  . Complication of anesthesia     family aggergy to annectine  . DEPRESSION 10/13/2007  . Cancer     RIGHT mastectomy with node dissection    Past Surgical History  Procedure Date  . Back surgery   . Cholecystectomy   . Abdominal hysterectomy   . Breast surgery     mastectomy -right  . Cervical spine surgery   . Fixation kyphoplasty lumbar  spine   . Parathyroid exploration 06/29/2011    Procedure: PARATHYROID EXPLORATION;  Surgeon: Velora Heckler, MD;  Location: [facility];  Service: General;  Laterality: Left;  Left Superior Parathyroidectomy  . Parathyroidectomy 06/29/11    Family History  Problem Relation Age of Onset  . Heart disease Sister   . Hypertension Sister   . Asthma Sister   . Cancer Maternal Aunt     breast  . Cancer Paternal Aunt     ovarian    History  Substance Use Topics  . Smoking status: Former Smoker -- 1.0 packs/day for 42 years    Types: Cigarettes    Quit date: 08/23/1977  . Smokeless tobacco: Never Used  . Alcohol Use: No    OB History    Grav Para Term Preterm Abortions TAB SAB Ect Mult Living                  Review of Systems  Constitutional: Negative.   Respiratory: Negative for cough, chest tightness and shortness of breath.   Cardiovascular: Positive for chest pain.  Gastrointestinal: Negative for nausea, vomiting and abdominal pain.  Neurological: Negative for dizziness, tingling, loss of consciousness, numbness and headaches.    Allergies  Decongest; Hydrocodone-acetaminophen; Iodine; Naproxen sodium; Other; Oxycodone hcl; Pentosan polysulfate sodium; Pseudoephedrine; Succinylcholine; Tape; Tramadol hcl; and Vioxx  Home Medications   Current Outpatient Rx  Name Route Sig Dispense Refill  . ASPIRIN 81 MG PO CHEW Oral Chew 81 mg by  mouth daily.     Marland Kitchen CALCIUM CARBONATE 600 MG PO TABS Oral Take 600 mg by mouth daily.    Marland Kitchen CARISOPRODOL 250 MG PO TABS Oral Take 250 mg by mouth 3 (three) times daily as needed. For muscle spasms.    Marland Kitchen VITAMIN D 1000 UNITS PO TABS Oral Take 1,000 Units by mouth daily.    Marland Kitchen DIAZEPAM 5 MG PO TABS Oral Take 5-10 mg by mouth every 8 (eight) hours as needed. BRAND NAMES ONLY. For anxiety.    Marland Kitchen DIFLUPREDNATE 0.05 % OP EMUL Both Eyes Place 1 drop into both eyes 4 (four) times daily. 4 times left eye, 2 times right eye    . EDARBI 80 MG PO TABS Oral Take 80  mg by mouth daily as needed. For blood pressure spikes.    Marland Kitchen CALENDULA EX CREA Apply externally Apply 1 application topically as needed. Applies to feet.    Marland Kitchen HYDROCHLOROTHIAZIDE 25 MG PO TABS Oral Take 1 tablet (25 mg total) by mouth daily. 30 tablet 0  . HYDROXYZINE PAMOATE 25 MG PO CAPS Oral Take 25 mg by mouth at bedtime.     Marland Kitchen MENTHOL (TOPICAL ANALGESIC) 6 % EX GEL Apply externally Apply 1 application topically as needed. Applies to feet.    Marland Kitchen MOXIFLOXACIN HCL 0.5 % OP SOLN Both Eyes Place 1 drop into both eyes 4 (four) times daily. 4 times daily on left side, 2 times daily on right side.    Carma Leaven M PLUS PO TABS Oral Take 1 tablet by mouth every other day. She alternates with taking a Perfect Prenatal Multivitamin.    Marland Kitchen OVER THE COUNTER MEDICATION Oral Take 1 capsule by mouth daily as needed. Barth Kirks' Water Balance Factor. For fluid if rings are too tight.    Marland Kitchen OVER THE COUNTER MEDICATION Oral Take 1 tablet by mouth every other day. Perfect Prenatal Multivitamin. She alternates with taking a Multivitamin tablet.     Marland Kitchen OVER THE COUNTER MEDICATION Oral Take 1 capsule by mouth daily. GI Flora. This contains Lactobacillus acidophilus, L. rhamnosus, L. casei, and Bifidobacterium longum.    . OXYCODONE-ACETAMINOPHEN 5-325 MG PO TABS Oral Take 1 tablet by mouth daily as needed. Severe pain    . POLYETHYL GLYCOL-PROPYL GLYCOL 0.4-0.3 % OP SOLN Both Eyes Place 2 drops into both eyes as needed. For dry eyes.    Marland Kitchen PROGESTERONE MICRONIZED 100 MG PO CAPS Oral Take 100 mg by mouth at bedtime.     . RED YEAST RICE EXTRACT 600 MG PO CAPS Oral Take 600 mg by mouth daily.     . TOLTERODINE TARTRATE ER 2 MG PO CP24 Oral Take 2 mg by mouth daily.    Marland Kitchen VITAMIN E 1000 UNITS PO CAPS Oral Take 1,000 Units by mouth daily.     . RECLAST IV Intravenous Inject into the vein.       BP 115/56  Pulse 103  Temp 99.2 F (37.3 C) (Oral)  Resp 16  SpO2 96%  Physical Exam  Nursing note and vitals  reviewed. Constitutional: She is oriented to person, place, and time. She appears well-developed and well-nourished.  HENT:  Head: Normocephalic and atraumatic.  Eyes: Pupils are equal, round, and reactive to light.  Neck: Normal range of motion. Neck supple.  Cardiovascular: Normal rate.   Pulmonary/Chest: Breath sounds normal. She exhibits tenderness.  Musculoskeletal: She exhibits no tenderness.       Arms: Neurological: She is alert and oriented to person, place,  and time.  Skin: Skin is warm and dry.    ED Course  Procedures (including critical care time)  Labs Reviewed - No data to display Dg Ribs Unilateral W/chest Left  06/13/2012  *RADIOLOGY REPORT*  Clinical Data: Fall, pain  LEFT RIBS AND CHEST - 3+ VIEW  Comparison: 06/24/2011  Findings: Cardiomediastinal silhouette is stable.  Again noted status post right mastectomy and right axillary lymph node dissection.  No acute infiltrate or pulmonary edema.  No left rib fracture is identified.  No diagnostic pneumothorax.  IMPRESSION: No acute disease.  No left rib fracture is identified.  No diagnostic pneumothorax.   Original Report Authenticated By: Natasha Mead, M.D.      1. Contusion, chest wall       MDM  X-rays reviewed and report per radiologist.         Linna Hoff, MD 06/13/12 [phone]

## 2012-06-15 ENCOUNTER — Ambulatory Visit (INDEPENDENT_AMBULATORY_CARE_PROVIDER_SITE_OTHER): Payer: Medicare Other | Admitting: Family

## 2012-06-15 ENCOUNTER — Telehealth: Payer: Self-pay | Admitting: Internal Medicine

## 2012-06-15 ENCOUNTER — Encounter: Payer: Self-pay | Admitting: Family

## 2012-06-15 VITALS — BP 180/100 | HR 121 | Temp 98.3°F

## 2012-06-15 DIAGNOSIS — M545 Low back pain: Secondary | ICD-10-CM

## 2012-06-15 DIAGNOSIS — S7000XA Contusion of unspecified hip, initial encounter: Secondary | ICD-10-CM

## 2012-06-15 DIAGNOSIS — Z23 Encounter for immunization: Secondary | ICD-10-CM

## 2012-06-15 DIAGNOSIS — R071 Chest pain on breathing: Secondary | ICD-10-CM

## 2012-06-15 DIAGNOSIS — R0789 Other chest pain: Secondary | ICD-10-CM

## 2012-06-15 MED ORDER — PREDNISONE 20 MG PO TABS: 40.0000 mg | ORAL_TABLET | Freq: Every day | ORAL | Status: DC

## 2012-06-15 NOTE — Patient Instructions (Addendum)
Chest Wall Pain Chest wall pain is pain in or around the bones and muscles of your chest. It may take up to 6 weeks to get better. It may take longer if you must stay physically active in your work and activities.  CAUSES  Chest wall pain may happen on its own. However, it may be caused by:  A viral illness like the flu.  Injury.  Coughing.  Exercise.  Arthritis.  Fibromyalgia.  Shingles. HOME CARE INSTRUCTIONS   Avoid overtiring physical activity. Try not to strain or perform activities that cause pain. This includes any activities using your chest or your abdominal and side muscles, especially if heavy weights are used.  Put ice on the sore area.  Put ice in a plastic bag.  Place a towel between your skin and the bag.  Leave the ice on for 15 to 20 minutes per hour while awake for the first 2 days.  Only take over-the-counter or prescription medicines for pain, discomfort, or fever as directed by your caregiver. SEEK IMMEDIATE MEDICAL CARE IF:   Your pain increases, or you are very uncomfortable.  You have a fever.  Your chest pain becomes worse.  You have new, unexplained symptoms.  You have nausea or vomiting.  You feel sweaty or lightheaded.  You have a cough with phlegm (sputum), or you cough up blood. MAKE SURE YOU:   Understand these instructions.  Will watch your condition.  Will get help right away if you are not doing well or get worse. Document Released: 08/09/2005 Document Revised: 11/01/2011 Document Reviewed: 04/05/2011 Exit[employer] Patient Information 2013 [employer].  

## 2012-06-15 NOTE — Telephone Encounter (Signed)
Caller: Customer service manager; Phone: [phone]; Reason for Call: Caller: Richard/Care Giver; Patient Name: [name]; PCP: Darryll Capers (Adults only); Best Callback Phone Number: [phone]; Reason for call: Pt.  Fell on 06/12/12 after standing up after a sound sleep, and was unsteady when standing.  Pt.  Was treated with Percocet for pain in ribs, L lower back and L hip pain.  The Percocet is not effective with pain relief.  Triaged per Falls, and the disposition for Severe pain with movement that limits normal activities: See Provider within 4 hours.  No appointments available with Dr.  Lovell Sheehan.  Per Guidelines, pt.  Was scheduled with NP Cambell at 11: 00 today.  Care instructions given.  CAN/db.

## 2012-06-16 ENCOUNTER — Encounter: Payer: Self-pay | Admitting: Family

## 2012-06-16 NOTE — Progress Notes (Signed)
Subjective:    Patient ID: [name], female    DOB: 01/13/1930, 78 y.o.   MRN: [id number]  HPI 78 year old white female, patient of Dr. Lovell Sheehan is in today as an emergency department followup. She was seen in the emergency department on 06/13/2012 after a fall at per day. Patient tripped attempting to get up to take her dog out. She has concerns today that she didn't get an adequate exam at the emergency department. She had x-rays done of the left side of her chest that did not reveal of rib fracture but she continues to complain of significant pain to her left chest wall. Her husband also reports a bruise to her left hip, and low back pain. Review of the x-rays shows no fracture. She was diagnosed with chest wall pain as a result of the fall, and told to take Aleve. Patient chronically takes Percocet and soma for her chronic conditions. Rates the pain today her left rates it 10 out of 10, worse with movement and taking a deep breath. Also reports pain to her lower left back. At her left hip. She is able to mobilize with no difficulty.   Review of Systems  Constitutional: Negative.   HENT: Negative.   Respiratory: Negative.  Negative for shortness of breath and wheezing.        Chest wall pain  Cardiovascular: Negative.  Negative for chest pain, palpitations and leg swelling.  Genitourinary: Negative.   Musculoskeletal: Positive for myalgias, back pain and arthralgias.  Skin: Negative.        Bruise to the left hip  Neurological: Negative.  Negative for weakness, light-headedness and headaches.  Hematological: Negative.   Psychiatric/Behavioral: Negative.    Past Medical History  Diagnosis Date  . ANXIETY 10/13/2007  . BACK PAIN 03/01/2007  . BREAST CANCER, HX OF 12/06/2007  . CONSTIPATION 01/06/2009  . DIVERTICULOSIS, COLON 05/11/2005  . External hemorrhoids without mention of complication 05/11/2005  . FATIGUE 08/07/2008  . HEMORRHOIDS, INTERNAL 05/11/2005  . Hypercalcemia  04/23/2009  . HYPERLIPIDEMIA NEC/NOS 03/01/2007  . HYPERTENSION 10/13/2007  . Impaired fasting glucose 11/14/2008  . INTERSTITIAL CYSTITIS 09/06/2007  . Irritable bowel syndrome 01/06/2009  . LIVER MASS 05/15/2009  . OSTEOPOROSIS 05/09/2009  . PARESTHESIA 09/06/2007  . POLYCYTHEMIA 04/23/2009  . STENOSIS, SPINAL, UNSPC REGION 03/01/2007  . Thyroid disease   . Diverticular disease   . Allergic     Anectine  . Complication of anesthesia     family aggergy to annectine  . DEPRESSION 10/13/2007  . Cancer     RIGHT mastectomy with node dissection    History   Social History  . Marital Status: Married    Spouse Name: N/A    Number of Children: N/A  . Years of Education: N/A   Occupational History  . Not on file.   Social History Main Topics  . Smoking status: Former Smoker -- 1.0 packs/day for 42 years    Types: Cigarettes    Quit date: 08/23/1977  . Smokeless tobacco: Never Used  . Alcohol Use: No  . Drug Use: No  . Sexually Active: Not on file   Other Topics Concern  . Not on file   Social History Narrative  . No narrative on file    Past Surgical History  Procedure Date  . Back surgery   . Cholecystectomy   . Abdominal hysterectomy   . Breast surgery     mastectomy -right  . Cervical spine surgery   . Fixation  kyphoplasty lumbar spine   . Parathyroid exploration 06/29/2011    Procedure: PARATHYROID EXPLORATION;  Surgeon: Velora Heckler, MD;  Location: [facility];  Service: General;  Laterality: Left;  Left Superior Parathyroidectomy  . Parathyroidectomy 06/29/11    Family History  Problem Relation Age of Onset  . Heart disease Sister   . Hypertension Sister   . Asthma Sister   . Cancer Maternal Aunt     breast  . Cancer Paternal Aunt     ovarian    Allergies  Allergen Reactions  . Decongest     Elevated blood pressure, "Doesn't work"  . Hydrocodone-Acetaminophen     REACTION: Hallucinations, paranoia, amnesia  . Iodine     REACTION: Hives, asthma  . Naproxen  Sodium     REACTION: Asthma  . Other     PATIENT STATES THAT SHE CAN NOT TAKE GENERIC MEDICATIONS. She also can not take anti-depressants.  . Oxycodone Hcl     REACTION: Hallucinations, paranoia, amnesia  . Pentosan Polysulfate Sodium     REACTION: unspecified per patient  . Pseudoephedrine     REACTION: Palpitations  . Succinylcholine     REACTION: "Can't wake up".  . Tape Dermatitis    Red inflammed  . Tramadol Hcl     Pt allergic to ANY GENERIC Rx  . Vioxx (Rofecoxib)     "Doesn't work for pain"    Current Outpatient Prescriptions on File Prior to Visit  Medication Sig Dispense Refill  . aspirin 81 MG chewable tablet Chew 81 mg by mouth daily.       . calcium carbonate (OS-CAL) 600 MG TABS Take 600 mg by mouth daily.      . carisoprodol (SOMA) 250 MG tablet Take 250 mg by mouth 3 (three) times daily as needed. For muscle spasms.      . cholecalciferol (VITAMIN D) 1000 UNITS tablet Take 1,000 Units by mouth daily.      . diazepam (VALIUM) 5 MG tablet Take 5-10 mg by mouth every 8 (eight) hours as needed. BRAND NAMES ONLY. For anxiety.      . Difluprednate (DUREZOL) 0.05 % EMUL Place 1 drop into both eyes 4 (four) times daily. 4 times left eye, 2 times right eye      . EDARBI 80 MG TABS Take 80 mg by mouth daily as needed. For blood pressure spikes.      . Homeopathic Products (CALENDULA) CREA Apply 1 application topically as needed. Applies to feet.      . hydrochlorothiazide (HYDRODIURIL) 25 MG tablet Take 1 tablet (25 mg total) by mouth daily.  30 tablet  0  . hydrOXYzine (VISTARIL) 25 MG capsule Take 25 mg by mouth at bedtime.       . Menthol, Topical Analgesic, (SOMBRA COOL THERAPY) 6 % GEL Apply 1 application topically as needed. Applies to feet.      . moxifloxacin (VIGAMOX) 0.5 % ophthalmic solution Place 1 drop into both eyes 4 (four) times daily. 4 times daily on left side, 2 times daily on right side.      . Multiple Vitamins-Minerals (MULTIVITAMINS THER. W/MINERALS)  TABS Take 1 tablet by mouth every other day. She alternates with taking a Perfect Prenatal Multivitamin.      Marland Kitchen OVER THE COUNTER MEDICATION Take 1 capsule by mouth daily as needed. Barth Kirks' Water Balance Factor. For fluid if rings are too tight.      Marland Kitchen OVER THE COUNTER MEDICATION Take 1 tablet by mouth every other  day. Perfect Prenatal Multivitamin. She alternates with taking a Multivitamin tablet.       Marland Kitchen OVER THE COUNTER MEDICATION Take 1 capsule by mouth daily. GI Flora. This contains Lactobacillus acidophilus, L. rhamnosus, L. casei, and Bifidobacterium longum.      Marland Kitchen oxyCODONE-acetaminophen (PERCOCET) 5-325 MG per tablet Take 1 tablet by mouth daily as needed. Severe pain      . Polyethyl Glycol-Propyl Glycol (SYSTANE) 0.4-0.3 % SOLN Place 2 drops into both eyes as needed. For dry eyes.      . progesterone (PROMETRIUM) 100 MG capsule Take 100 mg by mouth at bedtime.       . Red Yeast Rice Extract 600 MG CAPS Take 600 mg by mouth daily.       Marland Kitchen tolterodine (DETROL LA) 2 MG 24 hr capsule Take 2 mg by mouth daily.      . vitamin E 1000 UNIT capsule Take 1,000 Units by mouth daily.       . Zoledronic Acid (RECLAST IV) Inject into the vein.         BP 180/100  Pulse 121  Temp 98.3 F (36.8 C) (Oral)  SpO2 93%chart    Objective:   Physical Exam  Constitutional: She is oriented to person, place, and time. She appears well-developed and well-nourished.  Neck: Normal range of motion. Neck supple.  Cardiovascular: Normal rate, regular rhythm and normal heart sounds.   Pulmonary/Chest: Effort normal and breath sounds normal. She exhibits tenderness.       Chest wall tenderness elicited to palpation of the left lateral region. Pain is definitely reproducible.  Abdominal: Soft. Bowel sounds are normal.  Musculoskeletal: Normal range of motion. She exhibits tenderness. She exhibits no edema.       Full range of motion of the left hip. No evidence of fracture. Tenderness to palpation of the left  lateral aspect of the lower back. No tenderness to palpation of the stenosis process. Minimal pain with flexion and extension.  Neurological: She is alert and oriented to person, place, and time. She has normal reflexes. She displays normal reflexes. No cranial nerve deficit. She exhibits normal muscle tone. Coordination normal.  Skin: Skin is warm and dry.  Psychiatric: She has a normal mood and affect.          Assessment & Plan:  Assessment: Chest wall pain related to mechanical fall, low back pain  Plan: I believe that the x-rays that were obtained were adequate the patient's exam. I've explained the etiology of chest wall pain and the rationale for why she continues to have pain. Said she is unable to tolerate anti-inflammatory medication stronger than Aleve, prednisone 40 mg a day x5 days. Continue Percocet and soma as needed. Patient advised call the office if her symptoms worsen or persist. Recheck a schedule, appearing.

## 2012-06-21 ENCOUNTER — Other Ambulatory Visit: Payer: Self-pay | Admitting: Family

## 2012-06-21 NOTE — Telephone Encounter (Signed)
Pt husband requesting refill on prednisone, 20mg .  At least 7days/14pills.  [place] [phone]

## 2012-06-22 MED ORDER — PREDNISONE 20 MG PO TABS: 40.0000 mg | ORAL_TABLET | Freq: Every day | ORAL | Status: DC

## 2012-06-22 NOTE — Telephone Encounter (Signed)
Rx sent to [facility].  

## 2012-06-23 ENCOUNTER — Ambulatory Visit (INDEPENDENT_AMBULATORY_CARE_PROVIDER_SITE_OTHER): Payer: Medicare Other | Admitting: Internal Medicine

## 2012-06-23 ENCOUNTER — Encounter: Payer: Self-pay | Admitting: Internal Medicine

## 2012-06-23 ENCOUNTER — Ambulatory Visit (INDEPENDENT_AMBULATORY_CARE_PROVIDER_SITE_OTHER)
Admission: RE | Admit: 2012-06-23 | Discharge: 2012-06-23 | Disposition: A | Discharge status: Home / Self Care | Payer: Medicare Other | Source: Ambulatory Visit | Attending: Internal Medicine | Admitting: Internal Medicine

## 2012-06-23 VITALS — BP 130/80 | HR 76 | Temp 98.2°F | Resp 16 | Ht 64.0 in | Wt 174.0 lb

## 2012-06-23 DIAGNOSIS — R0789 Other chest pain: Secondary | ICD-10-CM

## 2012-06-23 DIAGNOSIS — R071 Chest pain on breathing: Secondary | ICD-10-CM

## 2012-06-23 MED ORDER — PREDNISONE (PAK) 10 MG PO TABS: ORAL_TABLET | ORAL | Status: DC

## 2012-06-23 NOTE — Progress Notes (Signed)
Subjective:    Patient ID: [name], female    DOB: 1930/07/05, 78 y.o.   MRN: [id number]  HPI  Patient is an 78 year old female who had a fall at home was altered and contusions and bruising of her rib cage both anterior and posterior with significant posterior rib cage and lateral cage pain.  She then has a pleuritic component to her pain and she takes a deep breath she was seen in the urgent care had rib details but did not show fracture she was seen by our mid-level extender who gave her a prednisone course to short prednisone course did relieve some of his discomfort  Review of Systems  Constitutional: Negative for activity change, appetite change and fatigue.  HENT: Negative for ear pain, congestion, neck pain, postnasal drip and sinus pressure.   Eyes: Negative for redness and visual disturbance.  Respiratory: Positive for chest tightness. Negative for cough, shortness of breath and wheezing.   Cardiovascular: Positive for chest pain.  Gastrointestinal: Negative for abdominal pain and abdominal distention.  Genitourinary: Negative for dysuria, frequency and menstrual problem.  Musculoskeletal: Positive for myalgias and arthralgias. Negative for joint swelling.  Skin: Negative for rash and wound.  Neurological: Negative for dizziness, weakness and headaches.  Hematological: Negative for adenopathy. Does not bruise/bleed easily.  Psychiatric/Behavioral: Negative for disturbed wake/sleep cycle and decreased concentration.   Past Medical History  Diagnosis Date  . ANXIETY 10/13/2007  . BACK PAIN 03/01/2007  . BREAST CANCER, HX OF 12/06/2007  . CONSTIPATION 01/06/2009  . DIVERTICULOSIS, COLON 05/11/2005  . External hemorrhoids without mention of complication 05/11/2005  . FATIGUE 08/07/2008  . HEMORRHOIDS, INTERNAL 05/11/2005  . Hypercalcemia 04/23/2009  . HYPERLIPIDEMIA NEC/NOS 03/01/2007  . HYPERTENSION 10/13/2007  . Impaired fasting glucose 11/14/2008  . INTERSTITIAL CYSTITIS  09/06/2007  . Irritable bowel syndrome 01/06/2009  . LIVER MASS 05/15/2009  . OSTEOPOROSIS 05/09/2009  . PARESTHESIA 09/06/2007  . POLYCYTHEMIA 04/23/2009  . STENOSIS, SPINAL, UNSPC REGION 03/01/2007  . Thyroid disease   . Diverticular disease   . Allergic     Anectine  . Complication of anesthesia     family aggergy to annectine  . DEPRESSION 10/13/2007  . Cancer     RIGHT mastectomy with node dissection    History   Social History  . Marital Status: Married    Spouse Name: N/A    Number of Children: N/A  . Years of Education: N/A   Occupational History  . Not on file.   Social History Main Topics  . Smoking status: Former Smoker -- 1.0 packs/day for 42 years    Types: Cigarettes    Quit date: 08/23/1977  . Smokeless tobacco: Never Used  . Alcohol Use: No  . Drug Use: No  . Sexually Active: Not on file   Other Topics Concern  . Not on file   Social History Narrative  . No narrative on file    Past Surgical History  Procedure Date  . Back surgery   . Cholecystectomy   . Abdominal hysterectomy   . Breast surgery     mastectomy -right  . Cervical spine surgery   . Fixation kyphoplasty lumbar spine   . Parathyroid exploration 06/29/2011    Procedure: PARATHYROID EXPLORATION;  Surgeon: Velora Heckler, MD;  Location: [facility];  Service: General;  Laterality: Left;  Left Superior Parathyroidectomy  . Parathyroidectomy 06/29/11    Family History  Problem Relation Age of Onset  . Heart disease Sister   .  Hypertension Sister   . Asthma Sister   . Cancer Maternal Aunt     breast  . Cancer Paternal Aunt     ovarian    Allergies  Allergen Reactions  . Decongest     Elevated blood pressure, "Doesn't work"  . Hydrocodone-Acetaminophen     REACTION: Hallucinations, paranoia, amnesia  . Iodine     REACTION: Hives, asthma  . Naproxen Sodium     REACTION: Asthma  . Other     PATIENT STATES THAT SHE CAN NOT TAKE GENERIC MEDICATIONS. She also can not take  anti-depressants.  . Oxycodone Hcl     REACTION: Hallucinations, paranoia, amnesia  . Pentosan Polysulfate Sodium     REACTION: unspecified per patient  . Pseudoephedrine     REACTION: Palpitations  . Succinylcholine     REACTION: "Can't wake up".  . Tape Dermatitis    Red inflammed  . Tramadol Hcl     Pt allergic to ANY GENERIC Rx  . Vioxx (Rofecoxib)     "Doesn't work for pain"    Current Outpatient Prescriptions on File Prior to Visit  Medication Sig Dispense Refill  . aspirin 81 MG chewable tablet Chew 81 mg by mouth daily.       . calcium carbonate (OS-CAL) 600 MG TABS Take 600 mg by mouth daily.      . carisoprodol (SOMA) 250 MG tablet Take 250 mg by mouth 3 (three) times daily as needed. For muscle spasms.      . cholecalciferol (VITAMIN D) 1000 UNITS tablet Take 1,000 Units by mouth daily.      . diazepam (VALIUM) 5 MG tablet Take 5-10 mg by mouth every 8 (eight) hours as needed. BRAND NAMES ONLY. For anxiety.      . Difluprednate (DUREZOL) 0.05 % EMUL Place 1 drop into both eyes 4 (four) times daily. 4 times left eye, 2 times right eye      . EDARBI 80 MG TABS Take 80 mg by mouth daily as needed. For blood pressure spikes.      . Homeopathic Products (CALENDULA) CREA Apply 1 application topically as needed. Applies to feet.      . hydrochlorothiazide (HYDRODIURIL) 25 MG tablet Take 1 tablet (25 mg total) by mouth daily.  30 tablet  0  . hydrOXYzine (VISTARIL) 25 MG capsule Take 25 mg by mouth at bedtime.       . Menthol, Topical Analgesic, (SOMBRA COOL THERAPY) 6 % GEL Apply 1 application topically as needed. Applies to feet.      . moxifloxacin (VIGAMOX) 0.5 % ophthalmic solution Place 1 drop into both eyes 4 (four) times daily. 4 times daily on left side, 2 times daily on right side.      . Multiple Vitamins-Minerals (MULTIVITAMINS THER. W/MINERALS) TABS Take 1 tablet by mouth every other day. She alternates with taking a Perfect Prenatal Multivitamin.      Marland Kitchen OVER THE  COUNTER MEDICATION Take 1 capsule by mouth daily as needed. Barth Kirks' Water Balance Factor. For fluid if rings are too tight.      Marland Kitchen OVER THE COUNTER MEDICATION Take 1 tablet by mouth every other day. Perfect Prenatal Multivitamin. She alternates with taking a Multivitamin tablet.       Marland Kitchen OVER THE COUNTER MEDICATION Take 1 capsule by mouth daily. GI Flora. This contains Lactobacillus acidophilus, L. rhamnosus, L. casei, and Bifidobacterium longum.      Marland Kitchen oxyCODONE-acetaminophen (PERCOCET) 5-325 MG per tablet Take 1 tablet by mouth  daily as needed. Severe pain      . Polyethyl Glycol-Propyl Glycol (SYSTANE) 0.4-0.3 % SOLN Place 2 drops into both eyes as needed. For dry eyes.      . progesterone (PROMETRIUM) 100 MG capsule Take 100 mg by mouth at bedtime.       . Red Yeast Rice Extract 600 MG CAPS Take 600 mg by mouth daily.       Marland Kitchen tolterodine (DETROL LA) 2 MG 24 hr capsule Take 2 mg by mouth daily.      . vitamin E 1000 UNIT capsule Take 1,000 Units by mouth daily.       . Zoledronic Acid (RECLAST IV) Inject into the vein.         BP 130/80  Pulse 76  Temp 98.2 F (36.8 C)  Resp 16  Ht 5\' 4"  (1.626 m)  Wt 174 lb (78.926 kg)  BMI 29.87 kg/m2       Objective:   Physical Exam  Constitutional: She is oriented to person, place, and time. She appears well-nourished.  HENT:  Head: Normocephalic and atraumatic.  Eyes: Conjunctivae normal are normal. Pupils are equal, round, and reactive to light.  Neck: Neck supple.  Cardiovascular:  Murmur heard. Pulmonary/Chest: Effort normal and breath sounds normal. She exhibits tenderness.  Abdominal: She exhibits distension. There is tenderness.  Neurological: She is alert and oriented to person, place, and time.  Skin: Skin is warm. She is diaphoretic.          Assessment & Plan:  Prednisone taper over 14 days Syrup eating x-rays if the pain does not significantly improve consider chest CT   Will look at [facility] spine today

## 2012-06-23 NOTE — Patient Instructions (Signed)
Go get the xray today

## 2012-06-27 ENCOUNTER — Telehealth: Payer: Self-pay | Admitting: Internal Medicine

## 2012-06-27 NOTE — Telephone Encounter (Signed)
Nothing is broken-everything is stable-per dr Lovell Sheehan- message given to pt

## 2012-06-27 NOTE — Telephone Encounter (Signed)
Caller: [name]/Patient; Phone: [phone]; Reason for Call: Needs Radiology test results ASAP.  Jms/can

## 2012-08-22 ENCOUNTER — Telehealth: Payer: Self-pay | Admitting: Internal Medicine

## 2012-08-22 MED ORDER — CELECOXIB 200 MG PO CAPS: 200.0000 mg | ORAL_CAPSULE | Freq: Every day | ORAL | Status: DC

## 2012-08-22 NOTE — Telephone Encounter (Signed)
Per dr Lovell Sheehan ok to give celebrex

## 2012-08-22 NOTE — Telephone Encounter (Signed)
Rx request.  [facility] Triage Call Report Triage Record Num: [id number] Operator: Jeraldine Loots Patient Name: [name] Call Date & Time: 08/21/2012 5:33:00PM Patient Phone: [phone] PCP: Darryll Capers Patient Gender: Female PCP Fax : [phone] Patient DOB: 1930-05-14 Practice Name: [facility] Reason for Call: Caller: [name]/Patient; PCP: Darryll Capers (Adults only); CB#: [phone]; Call regarding medication. She stopped taking her Percocet about 10 days ago and started taking her husbands Celebrex. It relieved all of her pain and is asking for MD to call her a prescription in for this. She had no dizziness or side effects from the medication. She stopped the Percocet to see what the difference would be if she just too Tylenol. Uses [facility] or [facility] for a 3 month supply. Protocol(s) Used: Office Note Recommended Outcome per Protocol: Information Noted and Sent to Office Reason for Outcome: Caller information to office Care Advice: ~ 12/

## 2012-10-04 ENCOUNTER — Telehealth: Payer: Self-pay | Admitting: Internal Medicine

## 2012-10-04 MED ORDER — AMOXICILLIN-POT CLAVULANATE 875-125 MG PO TABS: 1.0000 | ORAL_TABLET | Freq: Two times a day (BID) | ORAL | Status: DC

## 2012-10-04 MED ORDER — CELECOXIB 200 MG PO CAPS: 200.0000 mg | ORAL_CAPSULE | Freq: Every day | ORAL | Status: DC

## 2012-10-04 NOTE — Telephone Encounter (Signed)
meds called - per dr Lovell Sheehan- [id number] bid for 1-0 days
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
## 2012-10-16 HISTORY — PX: OTHER SURGICAL HISTORY: SHX169

## 2012-10-17 ENCOUNTER — Other Ambulatory Visit: Payer: Self-pay | Admitting: Orthopedic Surgery

## 2012-10-17 DIAGNOSIS — M79605 Pain in left leg: Secondary | ICD-10-CM

## 2012-10-18 ENCOUNTER — Telehealth (HOSPITAL_COMMUNITY): Payer: Self-pay | Admitting: *Deleted

## 2012-10-18 ENCOUNTER — Ambulatory Visit
Admission: RE | Admit: 2012-10-18 | Discharge: 2012-10-18 | Disposition: A | Discharge status: Home / Self Care | Payer: Medicare Other | Source: Ambulatory Visit | Attending: Orthopedic Surgery | Admitting: Orthopedic Surgery

## 2012-10-18 ENCOUNTER — Encounter (HOSPITAL_COMMUNITY): Payer: Self-pay | Admitting: Oncology

## 2012-10-18 ENCOUNTER — Ambulatory Visit (HOSPITAL_COMMUNITY): Admission: RE | Admit: 2012-10-18 | Payer: Medicare Other | Source: Ambulatory Visit

## 2012-10-18 ENCOUNTER — Encounter (HOSPITAL_COMMUNITY): Payer: Medicare Other | Attending: Oncology | Admitting: Oncology

## 2012-10-18 VITALS — BP 160/93 | HR 121 | Temp 98.7°F | Resp 20 | Wt 181.8 lb

## 2012-10-18 DIAGNOSIS — E210 Primary hyperparathyroidism: Secondary | ICD-10-CM

## 2012-10-18 DIAGNOSIS — M810 Age-related osteoporosis without current pathological fracture: Secondary | ICD-10-CM | POA: Insufficient documentation

## 2012-10-18 DIAGNOSIS — G609 Hereditary and idiopathic neuropathy, unspecified: Secondary | ICD-10-CM | POA: Insufficient documentation

## 2012-10-18 DIAGNOSIS — Z853 Personal history of malignant neoplasm of breast: Secondary | ICD-10-CM | POA: Insufficient documentation

## 2012-10-18 DIAGNOSIS — Z09 Encounter for follow-up examination after completed treatment for conditions other than malignant neoplasm: Secondary | ICD-10-CM | POA: Insufficient documentation

## 2012-10-18 LAB — CBC WITH DIFFERENTIAL/PLATELET
Basophils Relative: 0 % (ref 0–1)
HCT: 45.6 % (ref 36.0–46.0)
Hemoglobin: 15.3 g/dL — ABNORMAL HIGH (ref 12.0–15.0)
Lymphocytes Relative: 14 % (ref 12–46)
Lymphs Abs: 2.1 10*3/uL (ref 0.7–4.0)
Monocytes Absolute: 0.7 10*3/uL (ref 0.1–1.0)
Monocytes Relative: 5 % (ref 3–12)
Neutro Abs: 12.7 10*3/uL — ABNORMAL HIGH (ref 1.7–7.7)
Neutrophils Relative %: 82 % — ABNORMAL HIGH (ref 43–77)
RBC: 5.15 MIL/uL — ABNORMAL HIGH (ref 3.87–5.11)
WBC: 15.5 10*3/uL — ABNORMAL HIGH (ref 4.0–10.5)

## 2012-10-18 LAB — COMPREHENSIVE METABOLIC PANEL
Albumin: 4.2 g/dL (ref 3.5–5.2)
Alkaline Phosphatase: 69 U/L (ref 39–117)
BUN: 19 mg/dL (ref 6–23)
CO2: 26 mEq/L (ref 19–32)
Chloride: 101 mEq/L (ref 96–112)
GFR calc non Af Amer: 80 mL/min — ABNORMAL LOW (ref >90)
Glucose, Bld: 200 mg/dL — ABNORMAL HIGH (ref 70–99)
Potassium: 3.8 mEq/L (ref 3.5–5.1)
Total Bilirubin: 0.3 mg/dL (ref 0.3–1.2)

## 2012-10-18 MED ORDER — ZOLEDRONIC ACID 5 MG/100ML IV SOLN: 5.0000 mg | Freq: Once | INTRAVENOUS | Status: DC

## 2012-10-18 NOTE — Telephone Encounter (Signed)
[name] will not accept outside orders from other doctors I left a message to see if pt would like to have the bone density at another facility

## 2012-10-18 NOTE — Patient Instructions (Addendum)
[facility] Discharge Instructions  RECOMMENDATIONS MADE BY THE CONSULTANT AND ANY TEST RESULTS WILL BE SENT TO YOUR REFERRING PHYSICIAN.  EXAM FINDINGS BY THE PHYSICIAN TODAY AND SIGNS OR SYMPTOMS TO REPORT TO CLINIC OR PRIMARY PHYSICIAN: Exam and discussion by MD.  [name] are doing well.  Will check labs today and give you Reclast on Friday if your lab work is ok.  MEDICATIONS PRESCRIBED:  none  INSTRUCTIONS GIVEN AND DISCUSSED: Report any new lumps, bone pain or shortness of breath.  SPECIAL INSTRUCTIONS/FOLLOW-UP: Bone Density in March, Reclast on Friday and to see MD in 1 year.  Thank you for choosing [name] to provide your oncology and hematology care.  To afford each patient quality time with our providers, please arrive at least 15 minutes before your scheduled appointment time.  With your help, our goal is to use those 15 minutes to complete the necessary work-up to ensure our physicians have the information they need to help with your evaluation and healthcare recommendations.    Effective January 1st, 2014, we ask that you re-schedule your appointment with our physicians should you arrive 10 or more minutes late for your appointment.  We strive to give you quality time with our providers, and arriving late affects you and other patients whose appointments are after yours.    Again, thank you for choosing [facility].  Our hope is that these requests will decrease the amount of time that you wait before being seen by our physicians.       _____________________________________________________________  Should you have questions after your visit to [facility], please contact our office at [phone] between the hours of 8:30 a.m. and 5:00 p.m.  Voicemails left after 4:30 p.m. will not be returned until the following business day.  For prescription refill requests, have your pharmacy contact our office with your  prescription refill request.
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
## 2012-10-19 ENCOUNTER — Other Ambulatory Visit: Payer: Medicare Other

## 2012-10-19 ENCOUNTER — Ambulatory Visit (HOSPITAL_COMMUNITY): Payer: Medicare Other

## 2012-10-19 LAB — PTH, INTACT AND CALCIUM: PTH: 55.8 pg/mL (ref 14.0–72.0)

## 2012-10-20 ENCOUNTER — Encounter (HOSPITAL_BASED_OUTPATIENT_CLINIC_OR_DEPARTMENT_OTHER): Payer: Medicare Other

## 2012-10-20 VITALS — BP 166/74 | HR 89 | Temp 98.4°F | Resp 18

## 2012-10-20 DIAGNOSIS — M810 Age-related osteoporosis without current pathological fracture: Secondary | ICD-10-CM

## 2012-10-20 MED ORDER — SODIUM CHLORIDE 0.9 % IJ SOLN
10.0000 mL | INTRAMUSCULAR | Status: DC | PRN
  Administered 2012-10-20: 10 mL via INTRAVENOUS
  Filled 2012-10-20: qty 10

## 2012-10-20 MED ORDER — ZOLEDRONIC ACID 5 MG/100ML IV SOLN
INTRAVENOUS | Status: AC
  Filled 2012-10-20: qty 100

## 2012-10-20 MED ORDER — ZOLEDRONIC ACID 5 MG/100ML IV SOLN: 5.0000 mg | Freq: Once | INTRAVENOUS | Status: DC

## 2012-10-20 MED ORDER — SODIUM CHLORIDE 0.9 % IV SOLN
INTRAVENOUS | Status: DC
  Administered 2012-10-20: 14:00:00 via INTRAVENOUS

## 2012-10-20 MED ORDER — ZOLEDRONIC ACID 5 MG/100ML IV SOLN
5.0000 mg | Freq: Once | INTRAVENOUS | Status: AC
  Administered 2012-10-20: 5 mg via INTRAVENOUS

## 2012-10-20 NOTE — Progress Notes (Signed)
Reclast 5 mg IV infused over 30 minutes without difficulty.

## 2012-10-20 NOTE — Telephone Encounter (Signed)
Patient to contact her MD @ [place] to see if he will order study.

## 2012-10-23 ENCOUNTER — Ambulatory Visit: Payer: Medicare Other | Admitting: Internal Medicine

## 2012-10-23 ENCOUNTER — Telehealth: Payer: Self-pay | Admitting: Internal Medicine

## 2012-10-23 NOTE — Telephone Encounter (Signed)
Pt oncologist Dr Jerelyn Scott is recommending pt to have bone density test, Can I sch?

## 2012-10-23 NOTE — Telephone Encounter (Signed)
Pt said it has not been over 2 yrs since she had bmd. Pt will contact dr Gerrit Friends office

## 2012-10-23 NOTE — Telephone Encounter (Signed)
I dont see where she has had one. Ask her when was her last one and if it has been2 years,she may have

## 2012-10-30 ENCOUNTER — Telehealth: Payer: Self-pay | Admitting: Internal Medicine

## 2012-10-30 DIAGNOSIS — M810 Age-related osteoporosis without current pathological fracture: Secondary | ICD-10-CM

## 2012-10-30 NOTE — Telephone Encounter (Signed)
Order placed

## 2012-10-30 NOTE — Telephone Encounter (Signed)
See call documentation from 3/3 - [name]' pt. Patient checked w/insurance and they will pay. She wants and order put in for [facility] to set up. She is quite frustrated that it has been taking so long. Please place order and let a scheduler know so we can call her to schedule. Pt wants to have the scan this Thursday.

## 2012-10-31 NOTE — Telephone Encounter (Signed)
Pt is sch for 3-13- at [facility]

## 2012-11-02 ENCOUNTER — Other Ambulatory Visit: Payer: Medicare Other

## 2012-11-03 ENCOUNTER — Ambulatory Visit (INDEPENDENT_AMBULATORY_CARE_PROVIDER_SITE_OTHER)
Admission: RE | Admit: 2012-11-03 | Discharge: 2012-11-03 | Disposition: A | Discharge status: Home / Self Care | Payer: Medicare Other | Source: Ambulatory Visit | Attending: Internal Medicine | Admitting: Internal Medicine

## 2012-11-03 DIAGNOSIS — M810 Age-related osteoporosis without current pathological fracture: Secondary | ICD-10-CM

## 2012-11-23 ENCOUNTER — Telehealth: Payer: Self-pay | Admitting: Internal Medicine

## 2012-11-23 NOTE — Telephone Encounter (Signed)
Patient called stating that she would like a call back with bone density results and also send a copy of it over to her Dr. Glenford Peers. Please assist.

## 2012-11-24 NOTE — Telephone Encounter (Signed)
Pt informed an d faxed to appropriate office

## 2013-01-16 ENCOUNTER — Telehealth: Payer: Self-pay | Admitting: Internal Medicine

## 2013-01-16 NOTE — Telephone Encounter (Signed)
1. Pt needs referral to a neurologist that does EMG/NCV lumbar section.,BLE lumbar section. 2. Also needs MRI for thorasic, with and without contrast. Pt is going to a specialist, and they require these things. Pls advise.

## 2013-01-17 NOTE — Telephone Encounter (Signed)
Pt will take to [facility]ic [facility]-(the script) and they will do

## 2013-01-24 ENCOUNTER — Ambulatory Visit (INDEPENDENT_AMBULATORY_CARE_PROVIDER_SITE_OTHER): Payer: Medicare Other | Admitting: Internal Medicine

## 2013-01-24 ENCOUNTER — Encounter: Payer: Self-pay | Admitting: Internal Medicine

## 2013-01-24 VITALS — BP 160/84 | HR 96 | Temp 98.2°F | Resp 18 | Ht 64.0 in | Wt 184.0 lb

## 2013-01-24 DIAGNOSIS — E039 Hypothyroidism, unspecified: Secondary | ICD-10-CM

## 2013-01-24 DIAGNOSIS — G609 Hereditary and idiopathic neuropathy, unspecified: Secondary | ICD-10-CM

## 2013-01-24 DIAGNOSIS — IMO0001 Reserved for inherently not codable concepts without codable children: Secondary | ICD-10-CM

## 2013-01-24 DIAGNOSIS — M51379 Other intervertebral disc degeneration, lumbosacral region without mention of lumbar back pain or lower extremity pain: Secondary | ICD-10-CM

## 2013-01-24 DIAGNOSIS — M5137 Other intervertebral disc degeneration, lumbosacral region: Secondary | ICD-10-CM

## 2013-01-24 DIAGNOSIS — I10 Essential (primary) hypertension: Secondary | ICD-10-CM

## 2013-01-24 LAB — COMPREHENSIVE METABOLIC PANEL
Albumin: 4.2 g/dL (ref 3.5–5.2)
Alkaline Phosphatase: 55 U/L (ref 39–117)
BUN: 17 mg/dL (ref 6–23)
Calcium: 9.5 mg/dL (ref 8.4–10.5)
Creatinine, Ser: 0.9 mg/dL (ref 0.4–1.2)
Glucose, Bld: 167 mg/dL — ABNORMAL HIGH (ref 70–99)
Potassium: 4.8 mEq/L (ref 3.5–5.1)

## 2013-01-24 LAB — HEMOGLOBIN A1C: Hgb A1c MFr Bld: 6.6 % — ABNORMAL HIGH (ref 4.6–6.5)

## 2013-01-24 NOTE — Patient Instructions (Signed)
You will be called to see [facility] for your tests.
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
## 2013-01-26 ENCOUNTER — Telehealth: Payer: Self-pay | Admitting: Internal Medicine

## 2013-01-26 NOTE — Telephone Encounter (Signed)
Pt called and does NOT want to be seen for Peripheral Neuropathy.(this is what [facility] said she was to consult for, pt refused)  She does not have nor ever had this, per pt. [facility] needs to know what is exactly is going on with the nerve with pt.. Pt has nerve damage.  Pt needs referral to a neurologist that does EMG/NCV in lumbar section.,BLE lumbar section  Pt would like to talk to someone to ensure this will get ordered correctly.

## 2013-01-26 NOTE — Telephone Encounter (Signed)
Talked with pt and she will go to neurologist

## 2013-01-29 ENCOUNTER — Telehealth (HOSPITAL_COMMUNITY): Payer: Self-pay

## 2013-01-29 NOTE — Telephone Encounter (Signed)
Call from patient requesting that she receive her zometa on the same day that she is seen by Dr. Mariel Sleet.  Patient instructed that if approved by MD she would have to have blood work done also and that would increase the amount of time that she would need to be here. Is scheduled to be seen on 10/17/2013 @ 11am.

## 2013-01-30 ENCOUNTER — Other Ambulatory Visit: Payer: Self-pay | Admitting: Internal Medicine

## 2013-01-30 DIAGNOSIS — E119 Type 2 diabetes mellitus without complications: Secondary | ICD-10-CM

## 2013-01-30 NOTE — Progress Notes (Signed)
Pt informed and referral sent to [facility]

## 2013-02-02 ENCOUNTER — Other Ambulatory Visit: Payer: Self-pay | Admitting: Orthopedic Surgery

## 2013-02-02 ENCOUNTER — Telehealth: Payer: Self-pay | Admitting: Internal Medicine

## 2013-02-02 DIAGNOSIS — K7689 Other specified diseases of liver: Secondary | ICD-10-CM

## 2013-02-02 NOTE — Telephone Encounter (Signed)
Pt would like to try elmiron (new drug) for cystitis and hydrocortisone for Ibs call into [facility]

## 2013-02-02 NOTE — Telephone Encounter (Signed)
Pt informed- she needs to call urologist for elmiron and gi for   IBS meds

## 2013-02-09 ENCOUNTER — Ambulatory Visit (INDEPENDENT_AMBULATORY_CARE_PROVIDER_SITE_OTHER): Payer: Medicare Other

## 2013-02-09 ENCOUNTER — Ambulatory Visit: Payer: Self-pay | Admitting: Diagnostic Neuroimaging

## 2013-02-09 ENCOUNTER — Ambulatory Visit (INDEPENDENT_AMBULATORY_CARE_PROVIDER_SITE_OTHER): Payer: Medicare Other | Admitting: Neurology

## 2013-02-09 DIAGNOSIS — Z0289 Encounter for other administrative examinations: Secondary | ICD-10-CM

## 2013-02-09 DIAGNOSIS — G544 Lumbosacral root disorders, not elsewhere classified: Secondary | ICD-10-CM

## 2013-02-09 DIAGNOSIS — M545 Low back pain: Secondary | ICD-10-CM

## 2013-02-09 NOTE — Procedures (Signed)
History of present illness: [name] is 79 years old Caucasian female, presenting with chronic low back pain, radiating pain to bilateral lower extremity, left worse than right.  On physical examination: Bilateral lower extremity motor strength was normal, deep tendon reflex were hypoactive and symmetric, trace ankle reflexes.  Nerve conduction study:  Bilateral peroneal sensory responses were normal. Bilateral peroneal to EDB, and tibial motor responses were normal.  Electromyography:  Selected needle examination was performed at left lower extremity muscles, and left lumbosacral paraspinal muscles.  Needle examination of left tibialis anterior, medial gastrocnemius, vastus lateralis, peroneal longus, biceps femoris short head, was normal,  There was no spontaneous activity at left lumbosacral paraspinal muscles, left L4, L5, S1  In conclusion:  This is a normal study, there was no electrodiagnostic evidence of large fiber peripheral neuropathy, or left lumbosacral radiculopathy.

## 2013-02-13 ENCOUNTER — Other Ambulatory Visit: Payer: Self-pay | Admitting: Obstetrics and Gynecology

## 2013-02-19 ENCOUNTER — Emergency Department (HOSPITAL_COMMUNITY)
Admission: EM | Admit: 2013-02-19 | Discharge: 2013-02-19 | Disposition: A | Discharge status: Home / Self Care | Payer: Medicare Other | Attending: Emergency Medicine | Admitting: Emergency Medicine

## 2013-02-19 ENCOUNTER — Telehealth: Payer: Self-pay | Admitting: Internal Medicine

## 2013-02-19 ENCOUNTER — Emergency Department (HOSPITAL_COMMUNITY): Payer: Medicare Other

## 2013-02-19 ENCOUNTER — Telehealth: Payer: Self-pay | Admitting: *Deleted

## 2013-02-19 ENCOUNTER — Encounter (HOSPITAL_COMMUNITY): Payer: Self-pay | Admitting: Emergency Medicine

## 2013-02-19 DIAGNOSIS — T07XXXA Unspecified multiple injuries, initial encounter: Secondary | ICD-10-CM

## 2013-02-19 DIAGNOSIS — Z8639 Personal history of other endocrine, nutritional and metabolic disease: Secondary | ICD-10-CM | POA: Insufficient documentation

## 2013-02-19 DIAGNOSIS — Z8739 Personal history of other diseases of the musculoskeletal system and connective tissue: Secondary | ICD-10-CM | POA: Insufficient documentation

## 2013-02-19 DIAGNOSIS — S59909A Unspecified injury of unspecified elbow, initial encounter: Secondary | ICD-10-CM | POA: Insufficient documentation

## 2013-02-19 DIAGNOSIS — Y92009 Unspecified place in unspecified non-institutional (private) residence as the place of occurrence of the external cause: Secondary | ICD-10-CM | POA: Insufficient documentation

## 2013-02-19 DIAGNOSIS — S298XXA Other specified injuries of thorax, initial encounter: Secondary | ICD-10-CM | POA: Insufficient documentation

## 2013-02-19 DIAGNOSIS — M810 Age-related osteoporosis without current pathological fracture: Secondary | ICD-10-CM | POA: Insufficient documentation

## 2013-02-19 DIAGNOSIS — S6990XA Unspecified injury of unspecified wrist, hand and finger(s), initial encounter: Secondary | ICD-10-CM | POA: Insufficient documentation

## 2013-02-19 DIAGNOSIS — E785 Hyperlipidemia, unspecified: Secondary | ICD-10-CM | POA: Insufficient documentation

## 2013-02-19 DIAGNOSIS — R296 Repeated falls: Secondary | ICD-10-CM | POA: Insufficient documentation

## 2013-02-19 DIAGNOSIS — Z862 Personal history of diseases of the blood and blood-forming organs and certain disorders involving the immune mechanism: Secondary | ICD-10-CM | POA: Insufficient documentation

## 2013-02-19 DIAGNOSIS — Z87448 Personal history of other diseases of urinary system: Secondary | ICD-10-CM | POA: Insufficient documentation

## 2013-02-19 DIAGNOSIS — Z87891 Personal history of nicotine dependence: Secondary | ICD-10-CM | POA: Insufficient documentation

## 2013-02-19 DIAGNOSIS — Y9389 Activity, other specified: Secondary | ICD-10-CM | POA: Insufficient documentation

## 2013-02-19 DIAGNOSIS — Z853 Personal history of malignant neoplasm of breast: Secondary | ICD-10-CM | POA: Insufficient documentation

## 2013-02-19 DIAGNOSIS — E079 Disorder of thyroid, unspecified: Secondary | ICD-10-CM | POA: Insufficient documentation

## 2013-02-19 DIAGNOSIS — Z8719 Personal history of other diseases of the digestive system: Secondary | ICD-10-CM | POA: Insufficient documentation

## 2013-02-19 DIAGNOSIS — I10 Essential (primary) hypertension: Secondary | ICD-10-CM | POA: Insufficient documentation

## 2013-02-19 DIAGNOSIS — Z79899 Other long term (current) drug therapy: Secondary | ICD-10-CM | POA: Insufficient documentation

## 2013-02-19 DIAGNOSIS — Z8659 Personal history of other mental and behavioral disorders: Secondary | ICD-10-CM | POA: Insufficient documentation

## 2013-02-19 MED ORDER — DIAZEPAM 5 MG/ML IJ SOLN
5.0000 mg | Freq: Once | INTRAMUSCULAR | Status: AC
  Administered 2013-02-19: 5 mg via INTRAMUSCULAR
  Filled 2013-02-19: qty 2

## 2013-02-19 MED ORDER — OXYCODONE-ACETAMINOPHEN 5-325 MG PO TABS: 1.0000 | ORAL_TABLET | ORAL | Status: DC | PRN

## 2013-02-19 MED ORDER — DIAZEPAM 5 MG/ML IJ SOLN: 5.0000 mg | Freq: Once | INTRAMUSCULAR | Status: DC

## 2013-02-19 MED ORDER — KETOROLAC TROMETHAMINE 15 MG/ML IJ SOLN
15.0000 mg | Freq: Once | INTRAMUSCULAR | Status: AC
  Administered 2013-02-19: 15 mg via INTRAMUSCULAR
  Filled 2013-02-19: qty 1

## 2013-02-19 MED ORDER — HYDROMORPHONE HCL PF 1 MG/ML IJ SOLN: 0.7500 mg | Freq: Once | INTRAMUSCULAR | Status: DC

## 2013-02-19 MED ORDER — HYDROMORPHONE HCL PF 1 MG/ML IJ SOLN
0.7500 mg | Freq: Once | INTRAMUSCULAR | Status: AC
  Administered 2013-02-19: 0.75 mg via INTRAMUSCULAR
  Filled 2013-02-19: qty 1

## 2013-02-19 MED ORDER — KETOROLAC TROMETHAMINE 15 MG/ML IJ SOLN: 15.0000 mg | Freq: Once | INTRAMUSCULAR | Status: DC

## 2013-02-19 MED ORDER — HYDROMORPHONE HCL PF 1 MG/ML IJ SOLN
1.0000 mg | Freq: Once | INTRAMUSCULAR | Status: AC
  Administered 2013-02-19: 1 mg via INTRAMUSCULAR
  Filled 2013-02-19: qty 1

## 2013-02-19 NOTE — Telephone Encounter (Signed)
She will have to find on call orthopedist

## 2013-02-19 NOTE — ED Notes (Signed)
Pt here via ems s/p fall on rt hip hx of chronic hip pain now c/o pain to that rt  hip and lower back pain no deformity noted

## 2013-02-19 NOTE — ED Notes (Signed)
Bed:[facility]<BR> Expected date:<BR> Expected time:<BR> Means of arrival:<BR> Comments:<BR> Fall-rt hip pain

## 2013-02-19 NOTE — ED Provider Notes (Signed)
History    79 year old female with right-sided pain. Patient was about to go for a walk yesterday when she had her left foot up to tie her shoe. When she did this she lost her balance and fell onto her right side. She's complaining of pain primarily in her right hip, but also her right chest wall, right shoulder and R elbow. Patient has been taking Valium, soma and hydrocodone with only mild relief. No acute numbness or tingling.Unable in ambulate because of the pain. She states that she is instead scooting on her back. She does not think she hit her head in the fall. She denies any acute headache or neck pain or back pain.   CSN: [id number] Arrival date & time 02/19/13  1342  First MD Initiated Contact with Patient 02/19/13 1459     Chief Complaint  Patient presents with  . Fall   (Consider location/radiation/quality/duration/timing/severity/associated sxs/prior Treatment) HPI Past Medical History  Diagnosis Date  . ANXIETY 10/13/2007  . BACK PAIN 03/01/2007  . BREAST CANCER, HX OF 12/06/2007  . CONSTIPATION 01/06/2009  . DIVERTICULOSIS, COLON 05/11/2005  . External hemorrhoids without mention of complication 05/11/2005  . FATIGUE 08/07/2008  . HEMORRHOIDS, INTERNAL 05/11/2005  . Hypercalcemia 04/23/2009  . HYPERLIPIDEMIA NEC/NOS 03/01/2007  . HYPERTENSION 10/13/2007  . Impaired fasting glucose 11/14/2008  . INTERSTITIAL CYSTITIS 09/06/2007  . Irritable bowel syndrome 01/06/2009  . LIVER MASS 05/15/2009  . OSTEOPOROSIS 05/09/2009  . PARESTHESIA 09/06/2007  . POLYCYTHEMIA 04/23/2009  . STENOSIS, SPINAL, UNSPC REGION 03/01/2007  . Thyroid disease   . Diverticular disease   . Allergic     Anectine  . Complication of anesthesia     family aggergy to annectine  . DEPRESSION 10/13/2007  . Cancer     RIGHT mastectomy with node dissection   Past Surgical History  Procedure Laterality Date  . Back surgery    . Cholecystectomy    . Abdominal hysterectomy    . Breast surgery      mastectomy  -right  . Cervical spine surgery    . Fixation kyphoplasty lumbar spine    . Parathyroid exploration  06/29/2011    Procedure: PARATHYROID EXPLORATION;  Surgeon: Velora Heckler, MD;  Location: [facility];  Service: General;  Laterality: Left;  Left Superior Parathyroidectomy  . Parathyroidectomy  06/29/11  . Cortisone injection  10/16/12    left knee   Family History  Problem Relation Age of Onset  . Heart disease Sister   . Hypertension Sister   . Asthma Sister   . Cancer Maternal Aunt     breast  . Cancer Paternal Aunt     ovarian   History  Substance Use Topics  . Smoking status: Former Smoker -- 1.00 packs/day for 42 years    Types: Cigarettes    Quit date: 08/23/1977  . Smokeless tobacco: Never Used  . Alcohol Use: No   OB History   Grav Para Term Preterm Abortions TAB SAB Ect Mult Living                 Review of Systems  All systems reviewed and negative, other than as noted in HPI.   Allergies  Decongest; Hydrocodone-acetaminophen; Iodine; Naproxen sodium; Other; Oxycodone hcl; Pentosan polysulfate sodium; Pseudoephedrine; Succinylcholine; Tape; Tramadol hcl; and Vioxx  Home Medications   Current Outpatient Rx  Name  Route  Sig  Dispense  Refill  . Attapulgite (KAOPECTATE PO)   Oral   Take by mouth as needed.         Marland Kitchen  calcium carbonate (OS-CAL) 600 MG TABS   Oral   Take 600 mg by mouth daily.         . carisoprodol (SOMA) 250 MG tablet   Oral   Take 350 mg by mouth at bedtime. For muscle spasms.         . celecoxib (CELEBREX) 200 MG capsule   Oral   Take 1 capsule (200 mg total) by mouth daily.   90 capsule   3   . cholecalciferol (VITAMIN D) 1000 UNITS tablet   Oral   Take 1,000 Units by mouth daily.         . diazepam (VALIUM) 5 MG tablet   Oral   Take 5-10 mg by mouth every 8 (eight) hours as needed. BRAND NAMES ONLY. For anxiety.         . EDARBI 80 MG TABS   Oral   Take 80 mg by mouth daily as needed. For blood pressure spikes.          . Homeopathic Products (CALENDULA) CREA   Apply externally   Apply 1 application topically as needed. Applies to feet.         . hydrOXYzine (VISTARIL) 25 MG capsule   Oral   Take 25 mg by mouth at bedtime.          . Menthol, Topical Analgesic, (SOMBRA COOL THERAPY) 6 % GEL   Apply externally   Apply 1 application topically as needed. Applies to feet.         . Methenamine-Sodium Salicylate (CYSTEX PO)   Oral   Take by mouth.         . Multiple Vitamins-Minerals (MULTIVITAMINS THER. W/MINERALS) TABS   Oral   Take 1 tablet by mouth every other day. She alternates with taking a Perfect Prenatal Multivitamin.         Marland Kitchen OVER THE COUNTER MEDICATION   Oral   Take 1 capsule by mouth daily as needed. Barth Kirks' Water Balance Factor. For fluid if rings are too tight.         Marland Kitchen OVER THE COUNTER MEDICATION   Oral   Take 1 tablet by mouth every other day. Perfect Prenatal Multivitamin. She alternates with taking a Multivitamin tablet.          Marland Kitchen OVER THE COUNTER MEDICATION   Oral   Take 1 capsule by mouth daily. GI Flora. This contains Lactobacillus acidophilus, L. rhamnosus, L. casei, and Bifidobacterium longum.         Marland Kitchen oxyCODONE-acetaminophen (PERCOCET) 5-325 MG per tablet   Oral   Take 1 tablet by mouth daily as needed. Severe pain         . Polyethyl Glycol-Propyl Glycol (SYSTANE) 0.4-0.3 % SOLN   Both Eyes   Place 2 drops into both eyes as needed. For dry eyes.         . progesterone (PROMETRIUM) 100 MG capsule   Oral   Take 100 mg by mouth at bedtime.          . psyllium (METAMUCIL) 58.6 % powder   Oral   Take 1 packet by mouth daily.          . Red Yeast Rice Extract 600 MG CAPS   Oral   Take 600 mg by mouth daily.          . vitamin E 1000 UNIT capsule   Oral   Take 1,000 Units by mouth daily.          Marland Kitchen  Zoledronic Acid (RECLAST IV)   Intravenous   Inject into the vein.           BP 158/86  Pulse 107  Temp(Src) 99.4  F (37.4 C) (Oral)  Resp 17  SpO2 93% Physical Exam  Nursing note and vitals reviewed. Constitutional: She appears well-developed and well-nourished. No distress.  HENT:  Head: Normocephalic and atraumatic.  Eyes: Conjunctivae are normal. Right eye exhibits no discharge. Left eye exhibits no discharge.  Neck: Neck supple.  Cardiovascular: Normal rate, regular rhythm and normal heart sounds.  Exam reveals no gallop and no friction rub.   No murmur heard. Pulmonary/Chest: Effort normal and breath sounds normal. No respiratory distress.  Abdominal: Soft. She exhibits no distension. There is no tenderness.  Musculoskeletal: She exhibits no edema and no tenderness.  Pt able to actively range R hip, although with increased pain. Tenderness to palpation R buttock and R ischial tuberosity. No midline spinal tenderness. No chest wall tenderness. Small area of ecchymosis R elbow. ABle to full extend at elbow   Neurological: She is alert.  Skin: Skin is warm and dry.  Psychiatric: She has a normal mood and affect. Her behavior is normal. Thought content normal.    ED Course  Procedures (including critical care time) Labs Reviewed - No data to display Dg Hip Complete Right  02/19/2013   *RADIOLOGY REPORT*  Clinical Data: Fall  RIGHT HIP - COMPLETE 2+ VIEW  Comparison: None.  Findings: Three views of the right hip submitted.  Study is limited by diffuse osteopenia.  No definite acute fracture or subluxation.  IMPRESSION: Limited study by diffuse osteopenia.  No definite acute fracture or subluxation.   Original Report Authenticated By: Natasha Mead, M.D.   Ct Hip Right Wo Contrast  02/19/2013   *RADIOLOGY REPORT*  Clinical Data: Fall, right hip pain  CT OF THE RIGHT HIP WITHOUT CONTRAST  Technique:  Multidetector CT imaging was performed according to the standard protocol. Multiplanar CT image reconstructions were also generated.  Comparison: Right hip radiographs dated 02/19/2013  Findings: No fracture  or dislocation is seen.  The right hip joint space is preserved.  The visualized bony pelvis appears intact.  Mild sclerosis of the right parasymphyseal region (series 2/image 37), likely degenerative.  Visualized soft tissues are notable for vascular calcifications and prior appendectomy.  IMPRESSION: No fracture or dislocation is seen.   Original Report Authenticated By: Charline Bills, M.D.   1. Multiple contusions     MDM  [phone] with R sided body pain after fall. Seemingly mild tenderness/pain with ROM on exam. Imaging reassuring. PRN pain meds. Out pt FU.   Raeford Razor, MD 02/21/13 1031

## 2013-02-19 NOTE — Telephone Encounter (Signed)
Patient Information:  Caller Name: [name]  Phone: [phone]  Patient: Selena, [name]  Gender: Female  DOB: 20-Sep-1929  Age: 79 Years  PCP: Darryll Capers (Adults only)  Office Follow Up:  Does the office need to follow up with this patient?: Yes  Instructions For The Office: Please see symptoms - pt is wanting to know Dr Lovell Sheehan recommendations for best orthopedic surgeon if one is needed.  Pt states she will have EMS take her to [facility]   Symptoms  Reason For Call & Symptoms: 02/18/13 pt was standing and left foot propped on a low table to tie shoe, pt fell and hit right hip and entire right side on brick.   Pt has bruise on right elbow.  02/19/13 pain in right hip, right thigh, right upper shoulder.  Unable to walk on right leg.  Ice/heat with no relief.  Pain medication do not help.  Advised pt to call 911, pt agreed to call but states she wants to go to [name].   Pt is very concerned about wanting the best orthopedic surgeon if one is needed.   Requesting if Dr Lovell Sheehan would recommend someone specifically and call her cell phone and/or husbands if she is unavailable.   Pt [phone]  or husband, [name] [phone]  Reviewed Health History In EMR: Yes  Reviewed Medications In EMR: Yes  Reviewed Allergies In EMR: Yes  Reviewed Surgeries / Procedures: Yes  Date of Onset of Symptoms: 02/18/2013  Treatments Tried: Percocet, Soma  Treatments Tried Worked: No  Guideline(s) Used:  Hip Injury  Disposition Per Guideline:   Call EMS 911 Now  Reason For Disposition Reached:   Can't stand (bear weight) or walk  Advice Given:  N/A  Patient Will Follow Care Advice:  YES

## 2013-02-20 ENCOUNTER — Ambulatory Visit: Payer: Medicare Other

## 2013-02-20 DIAGNOSIS — S32040A Wedge compression fracture of fourth lumbar vertebra, initial encounter for closed fracture: Secondary | ICD-10-CM

## 2013-02-20 HISTORY — DX: Wedge compression fracture of fourth lumbar vertebra, initial encounter for closed fracture: S32.040A

## 2013-02-21 ENCOUNTER — Telehealth: Payer: Self-pay | Admitting: Internal Medicine

## 2013-02-21 ENCOUNTER — Encounter (HOSPITAL_COMMUNITY): Payer: Self-pay | Admitting: Emergency Medicine

## 2013-02-21 ENCOUNTER — Emergency Department (HOSPITAL_COMMUNITY): Payer: Medicare Other

## 2013-02-21 ENCOUNTER — Emergency Department (HOSPITAL_COMMUNITY)
Admission: EM | Admit: 2013-02-21 | Discharge: 2013-02-21 | Disposition: A | Discharge status: Home / Self Care | Payer: Medicare Other | Attending: Emergency Medicine | Admitting: Emergency Medicine

## 2013-02-21 DIAGNOSIS — Z862 Personal history of diseases of the blood and blood-forming organs and certain disorders involving the immune mechanism: Secondary | ICD-10-CM | POA: Insufficient documentation

## 2013-02-21 DIAGNOSIS — K5900 Constipation, unspecified: Secondary | ICD-10-CM | POA: Insufficient documentation

## 2013-02-21 DIAGNOSIS — I10 Essential (primary) hypertension: Secondary | ICD-10-CM | POA: Insufficient documentation

## 2013-02-21 DIAGNOSIS — Z9889 Other specified postprocedural states: Secondary | ICD-10-CM | POA: Insufficient documentation

## 2013-02-21 DIAGNOSIS — R296 Repeated falls: Secondary | ICD-10-CM | POA: Insufficient documentation

## 2013-02-21 DIAGNOSIS — S32040A Wedge compression fracture of fourth lumbar vertebra, initial encounter for closed fracture: Secondary | ICD-10-CM

## 2013-02-21 DIAGNOSIS — F411 Generalized anxiety disorder: Secondary | ICD-10-CM | POA: Insufficient documentation

## 2013-02-21 DIAGNOSIS — Z8639 Personal history of other endocrine, nutritional and metabolic disease: Secondary | ICD-10-CM | POA: Insufficient documentation

## 2013-02-21 DIAGNOSIS — W19XXXA Unspecified fall, initial encounter: Secondary | ICD-10-CM

## 2013-02-21 DIAGNOSIS — Z8679 Personal history of other diseases of the circulatory system: Secondary | ICD-10-CM | POA: Insufficient documentation

## 2013-02-21 DIAGNOSIS — Y939 Activity, unspecified: Secondary | ICD-10-CM | POA: Insufficient documentation

## 2013-02-21 DIAGNOSIS — Z79899 Other long term (current) drug therapy: Secondary | ICD-10-CM | POA: Insufficient documentation

## 2013-02-21 DIAGNOSIS — M810 Age-related osteoporosis without current pathological fracture: Secondary | ICD-10-CM | POA: Insufficient documentation

## 2013-02-21 DIAGNOSIS — Z8739 Personal history of other diseases of the musculoskeletal system and connective tissue: Secondary | ICD-10-CM | POA: Insufficient documentation

## 2013-02-21 DIAGNOSIS — S32009A Unspecified fracture of unspecified lumbar vertebra, initial encounter for closed fracture: Secondary | ICD-10-CM | POA: Insufficient documentation

## 2013-02-21 DIAGNOSIS — Z8719 Personal history of other diseases of the digestive system: Secondary | ICD-10-CM | POA: Insufficient documentation

## 2013-02-21 DIAGNOSIS — Z791 Long term (current) use of non-steroidal anti-inflammatories (NSAID): Secondary | ICD-10-CM | POA: Insufficient documentation

## 2013-02-21 DIAGNOSIS — Y929 Unspecified place or not applicable: Secondary | ICD-10-CM | POA: Insufficient documentation

## 2013-02-21 DIAGNOSIS — Z87891 Personal history of nicotine dependence: Secondary | ICD-10-CM | POA: Insufficient documentation

## 2013-02-21 DIAGNOSIS — W19XXXD Unspecified fall, subsequent encounter: Secondary | ICD-10-CM

## 2013-02-21 DIAGNOSIS — Z853 Personal history of malignant neoplasm of breast: Secondary | ICD-10-CM | POA: Insufficient documentation

## 2013-02-21 DIAGNOSIS — Z8659 Personal history of other mental and behavioral disorders: Secondary | ICD-10-CM | POA: Insufficient documentation

## 2013-02-21 DIAGNOSIS — N3010 Interstitial cystitis (chronic) without hematuria: Secondary | ICD-10-CM | POA: Insufficient documentation

## 2013-02-21 DIAGNOSIS — R109 Unspecified abdominal pain: Secondary | ICD-10-CM

## 2013-02-21 LAB — COMPREHENSIVE METABOLIC PANEL
BUN: 13 mg/dL (ref 6–23)
CO2: 28 mEq/L (ref 19–32)
Calcium: 9.6 mg/dL (ref 8.4–10.5)
Chloride: 99 mEq/L (ref 96–112)
Creatinine, Ser: 0.61 mg/dL (ref 0.50–1.10)
GFR calc non Af Amer: 83 mL/min — ABNORMAL LOW (ref >90)
Total Bilirubin: 0.4 mg/dL (ref 0.3–1.2)

## 2013-02-21 LAB — CBC WITH DIFFERENTIAL/PLATELET
Basophils Absolute: 0 10*3/uL (ref 0.0–0.1)
Basophils Relative: 0 % (ref 0–1)
Eosinophils Relative: 0 % (ref 0–5)
HCT: 45.2 % (ref 36.0–46.0)
Hemoglobin: 15.5 g/dL — ABNORMAL HIGH (ref 12.0–15.0)
MCHC: 34.3 g/dL (ref 30.0–36.0)
MCV: 88.6 fL (ref 78.0–100.0)
Monocytes Absolute: 0.7 10*3/uL (ref 0.1–1.0)
Monocytes Relative: 5 % (ref 3–12)
Neutro Abs: 10.6 10*3/uL — ABNORMAL HIGH (ref 1.7–7.7)
RDW: 12.4 % (ref 11.5–15.5)

## 2013-02-21 LAB — POCT I-STAT, CHEM 8
Calcium, Ion: 1.15 mmol/L (ref 1.13–1.30)
HCT: 48 % — ABNORMAL HIGH (ref 36.0–46.0)
Hemoglobin: 16.3 g/dL — ABNORMAL HIGH (ref 12.0–15.0)
Sodium: 140 mEq/L (ref 135–145)
TCO2: 27 mmol/L (ref 0–100)

## 2013-02-21 LAB — LIPASE, BLOOD: Lipase: 16 U/L (ref 11–59)

## 2013-02-21 LAB — URINE MICROSCOPIC-ADD ON

## 2013-02-21 LAB — URINALYSIS, ROUTINE W REFLEX MICROSCOPIC
Ketones, ur: NEGATIVE mg/dL
Leukocytes, UA: NEGATIVE
Nitrite: NEGATIVE
Protein, ur: NEGATIVE mg/dL
Urobilinogen, UA: 0.2 mg/dL (ref 0.0–1.0)

## 2013-02-21 MED ORDER — HYDROMORPHONE HCL 4 MG PO TABS
4.0000 mg | ORAL_TABLET | ORAL | Status: DC | PRN
Start: 2013-02-21 — End: 2013-02-26

## 2013-02-21 MED ORDER — PEG 3350-KCL-NABCB-NACL-NASULF 236 G PO SOLR: 4.0000 L | Freq: Once | ORAL | Status: DC

## 2013-02-21 MED ORDER — HYDROMORPHONE HCL PF 1 MG/ML IJ SOLN
1.0000 mg | Freq: Once | INTRAMUSCULAR | Status: AC
  Administered 2013-02-21: 1 mg via INTRAVENOUS
  Filled 2013-02-21: qty 1

## 2013-02-21 MED ORDER — SODIUM CHLORIDE 0.9 % IV BOLUS (SEPSIS)
500.0000 mL | Freq: Once | INTRAVENOUS | Status: AC
  Administered 2013-02-21: 500 mL via INTRAVENOUS

## 2013-02-21 NOTE — ED Notes (Signed)
MD at bedside. 

## 2013-02-21 NOTE — Progress Notes (Signed)
   CARE MANAGEMENT ED NOTE 02/21/2013  Patient:  [name], [name]   Account Number:  [id number]  Date Initiated:  02/21/2013  Documentation initiated by:  Radford Pax  Subjective/Objective Assessment:   Patient presents to ED post fall     Subjective/Objective Assessment Detail:     Action/Plan:   Action/Plan Detail:   Anticipated DC Date:       Status Recommendation to Physician:   Result of Recommendation:    Other ED Services  Consult Working Plan    DC Planning Services  CM consult  Other    Choice offered to / List presented to:  C-3 Spouse         HH agency  [facility]    Status of service:  Completed, signed off  ED Comments:   ED Comments Detail:  EDCM asked to see patient by EDP Dr. Teena Irani regarding possible home health needs.  Patient's husband at bedside. Explained to patient that we could set up an RN, PT, OT, and a Child psychotherapist for her to come see her at home. Patient reports that she would like to go to our rehab facility.  Expalined to patient that she would be unable to go to a facility straight from the ED, that she would have to be admittted to the hospital and then the rehab team would have to assess her and then qualify her.  EDCM went on to explain that she could get physical therapy at home and also, if she still wanted to go to a rehab facility, the home health social worker could help facilitate that from home.  Patient is agreeable to home health services. Patient's husband chose [facility].

## 2013-02-21 NOTE — Telephone Encounter (Signed)
pts last bowel movement was 4 days ago.  She has tried metamucil with no relief and just did an enema .  She is in a lot of pain from the fall.  She is in to much pain to come in to be seen and has already been to the ER.  She wants to be seen but she can't even to go to the bathroom because of pain.  She is not a pt of [facility] and would like to be set up to get some help.  Pain scale of 9.  Pain is in stomach, right hip, right leg.  Stomach is also bloated.  When she stands up she is so much pain she feels like her back is going to break. Please advise Dr Lovell Sheehan is out of the office today

## 2013-02-21 NOTE — ED Provider Notes (Signed)
History    CSN: [id number] Arrival date & time 02/21/13  1433  First MD Initiated Contact with Patient 02/21/13 1510     Chief Complaint  Patient presents with  . Pain   (Consider location/radiation/quality/duration/timing/severity/associated sxs/prior Treatment) HPI This 79 year old female has chronic severe low back pain at baseline takes typically 2 tablets of oxycodone or hydrocodone daily for stable back pain, her last MRI was a couple months ago without significant change, she fell within the last few days and initially was seen for right hip pain and some right chest wall type pain and had an unremarkable CT scan of her hip at that time, since her fall on her discharge from the emergency department a few days ago she now has constant diffuse 24-hour a day abdominal pain and worse than usual severe low back pain worse with movement and palpation and position changes for the last few days, she is now on trying to walk with a walker but today feels too weak to walk with a walker and had to call the ambulance to come to the ED, she is no new focal weakness or numbness to her legs she is worse than usual constipation the last few days and has not had a bowel movement in the last few days potentially at least in part because she has started taking at least 4 tablets of oxycodone and/or hydrocodone daily since her fall, she also has had decreased urination due to her abdominal pain and back pain the last few days there is no incontinence or change in bladder sensation except may be feeling some incomplete urinary retention. Her pain to her entire abdomen and entire low back constant and severe since her fall a few days ago. She has no anterior chest pain no shortness of breath no neck pain no head injury no confusion no fever. Past Medical History  Diagnosis Date  . ANXIETY 10/13/2007  . BACK PAIN 03/01/2007  . BREAST CANCER, HX OF 12/06/2007  . CONSTIPATION 01/06/2009  . DIVERTICULOSIS, COLON  05/11/2005  . External hemorrhoids without mention of complication 05/11/2005  . FATIGUE 08/07/2008  . HEMORRHOIDS, INTERNAL 05/11/2005  . Hypercalcemia 04/23/2009  . HYPERLIPIDEMIA NEC/NOS 03/01/2007  . HYPERTENSION 10/13/2007  . Impaired fasting glucose 11/14/2008  . INTERSTITIAL CYSTITIS 09/06/2007  . Irritable bowel syndrome 01/06/2009  . LIVER MASS 05/15/2009  . OSTEOPOROSIS 05/09/2009  . PARESTHESIA 09/06/2007  . POLYCYTHEMIA 04/23/2009  . STENOSIS, SPINAL, UNSPC REGION 03/01/2007  . Thyroid disease   . Diverticular disease   . Allergic     Anectine  . Complication of anesthesia     family aggergy to annectine  . DEPRESSION 10/13/2007  . Cancer     RIGHT mastectomy with node dissection   Past Surgical History  Procedure Laterality Date  . Back surgery    . Cholecystectomy    . Abdominal hysterectomy    . Breast surgery      mastectomy -right  . Cervical spine surgery    . Fixation kyphoplasty lumbar spine    . Parathyroid exploration  06/29/2011    Procedure: PARATHYROID EXPLORATION;  Surgeon: Velora Heckler, MD;  Location: [facility];  Service: General;  Laterality: Left;  Left Superior Parathyroidectomy  . Parathyroidectomy  06/29/11  . Cortisone injection  10/16/12    left knee   Family History  Problem Relation Age of Onset  . Heart disease Sister   . Hypertension Sister   . Asthma Sister   . Cancer Maternal Aunt  breast  . Cancer Paternal Aunt     ovarian   History  Substance Use Topics  . Smoking status: Former Smoker -- 1.00 packs/day for 42 years    Types: Cigarettes    Quit date: 08/23/1977  . Smokeless tobacco: Never Used  . Alcohol Use: No   OB History   Grav Para Term Preterm Abortions TAB SAB Ect Mult Living                 Review of Systems 10 Systems reviewed and are negative for acute change except as noted in the HPI. Allergies  Decongest; Hydrocodone-acetaminophen; Iodine; Naproxen sodium; Other; Oxycodone hcl; Pentosan polysulfate sodium;  Pseudoephedrine; Succinylcholine; Tape; Tramadol hcl; Vioxx; and Shellfish allergy  Home Medications   Current Outpatient Rx  Name  Route  Sig  Dispense  Refill  . Aloe Vera (ALOE CAPE) MISC   Does not apply   1 tablet by Does not apply route daily.         . Attapulgite (KAOPECTATE PO)   Oral   Take by mouth as needed.         . calcium carbonate (OS-CAL) 600 MG TABS   Oral   Take 600 mg by mouth daily.         . carisoprodol (SOMA) 250 MG tablet   Oral   Take 250 mg by mouth daily as needed (Pt is also on the 350 mg Soma.). For muscle spasms.         . carisoprodol (SOMA) 350 MG tablet   Oral   Take 350 mg by mouth at bedtime as needed for muscle spasms (Pt is also taking 250 mg Soma.).          Marland Kitchen celecoxib (CELEBREX) 200 MG capsule   Oral   Take 1 capsule (200 mg total) by mouth daily.   90 capsule   3   . cholecalciferol (VITAMIN D) 1000 UNITS tablet   Oral   Take 5,000 Units by mouth daily.          . diazepam (VALIUM) 5 MG tablet   Oral   Take 5 mg by mouth every 8 (eight) hours as needed for anxiety. BRAND NAMES ONLY. For anxiety.         . EDARBI 80 MG TABS   Oral   Take 80 mg by mouth daily as needed. For blood pressure spikes.         . Homeopathic Products (CALENDULA) CREA   Apply externally   Apply 1 application topically as needed. Applies to feet.         Marland Kitchen HYDROcodone-acetaminophen (NORCO/VICODIN) 5-325 MG per tablet   Oral   Take by mouth daily as needed for pain.         . hydrOXYzine (VISTARIL) 25 MG capsule   Oral   Take 25 mg by mouth at bedtime.          . Menthol, Topical Analgesic, (SOMBRA COOL THERAPY) 6 % GEL   Apply externally   Apply 1 application topically as needed. Applies to feet.         . Methenamine-Sodium Salicylate (CYSTEX PO)   Oral   Take 2 tablets by mouth 3 (three) times daily as needed (interstitial cystitis).          . Multiple Vitamins-Minerals (MULTIVITAMINS THER. W/MINERALS) TABS    Oral   Take 1 tablet by mouth every other day. She alternates with taking a Perfect Prenatal Multivitamin.         Marland Kitchen  OVER THE COUNTER MEDICATION   Oral   Take 1 capsule by mouth daily as needed. Barth Kirks' Water Balance Factor. For fluid if rings are too tight.         Marland Kitchen OVER THE COUNTER MEDICATION   Oral   Take 1 tablet by mouth every other day. Perfect Prenatal Multivitamin. She alternates with taking a Multivitamin tablet.          Marland Kitchen OVER THE COUNTER MEDICATION   Oral   Take 1 capsule by mouth daily as needed (stomach upset.). GI Flora. This contains Lactobacillus acidophilus, L. rhamnosus, L. casei, and Bifidobacterium longum.         Marland Kitchen oxyCODONE-acetaminophen (PERCOCET) 5-325 MG per tablet   Oral   Take 1 tablet by mouth at bedtime as needed for pain. Severe pain         . Polyethyl Glycol-Propyl Glycol (SYSTANE) 0.4-0.3 % SOLN   Both Eyes   Place 2 drops into both eyes as needed. For dry eyes.         . progesterone (PROMETRIUM) 100 MG capsule   Oral   Take 100 mg by mouth at bedtime.          . psyllium (METAMUCIL) 58.6 % powder   Oral   Take 1 packet by mouth daily as needed (constipation).          . Red Yeast Rice Extract 600 MG CAPS   Oral   Take 600 mg by mouth daily.          . vitamin E 1000 UNIT capsule   Oral   Take 1,000 Units by mouth daily.          . Zoledronic Acid (RECLAST IV)   Intravenous   Inject into the vein. Once a year.         Marland Kitchen HYDROmorphone (DILAUDID) 4 MG tablet   Oral   Take 1 tablet (4 mg total) by mouth every 4 (four) hours as needed for pain.   30 tablet   0   . polyethylene glycol (GOLYTELY) 236 G solution   Oral   Take 4,000 mLs by mouth once.   4000 mL   0    BP 147/65  Pulse 114  Temp(Src) 98.3 F (36.8 C) (Oral)  Resp 22  Ht 5\' 4"  (1.626 m)  SpO2 93% Physical Exam  Nursing note and vitals reviewed. Constitutional:  Awake, alert, nontoxic appearance.  HENT:  Head: Atraumatic.  Eyes:  Right eye exhibits no discharge. Left eye exhibits no discharge.  Neck: Neck supple.  Cervical spine nontender  Cardiovascular: Normal rate and regular rhythm.   No murmur heard. Pulmonary/Chest: Effort normal and breath sounds normal. No respiratory distress. She has no wheezes. She has no rales. She exhibits no tenderness.  Abdominal: Soft. Bowel sounds are normal. She exhibits no distension and no mass. There is tenderness. There is guarding. There is no rebound.  Mild to moderate diffuse abdominal tenderness without rebound  Musculoskeletal: She exhibits tenderness. She exhibits no edema.  Baseline ROM, no obvious new focal weakness. Thoracic spine nontender. Diffuse lumbar and paralumbar tenderness. Mild right hip tenderness with good passive range of motion right hip, no left tenderness no tenderness to the bilateral knees ankles or feet both feet have dorsalis pedis pulses intact both feet at Refill less than 2 seconds baseline light touch and good movement of both feet and legs actively with no obvious lateralizing focal weakness.  Neurological: She is alert.  Mental status  and motor strength appears baseline for patient and situation.  Skin: No rash noted.  Psychiatric: She has a normal mood and affect.    ED Course  Procedures (including critical care time) Called PCP office and also consulted CM to see Pt in ED, Pt would like to consider if rehab candidate if CT doesn't require admit. 1550 D/w PCP office and CM again; Pt happy with home health referral and discharge.  Patient / Family / Caregiver informed of clinical course, understand medical decision-making process, and agree with plan.[id number]   Labs Reviewed  CBC WITH DIFFERENTIAL - Abnormal; Notable for the following:    WBC 12.7 (*)    Hemoglobin 15.5 (*)    Neutrophils Relative % 83 (*)    Neutro Abs 10.6 (*)    Lymphocytes Relative 11 (*)    All other components within normal limits  COMPREHENSIVE METABOLIC PANEL -  Abnormal; Notable for the following:    Glucose, Bld 159 (*)    GFR calc non Af Amer 83 (*)    All other components within normal limits  URINALYSIS, ROUTINE W REFLEX MICROSCOPIC - Abnormal; Notable for the following:    Hgb urine dipstick TRACE (*)    All other components within normal limits  URINE MICROSCOPIC-ADD ON - Abnormal; Notable for the following:    Squamous Epithelial / LPF FEW (*)    Bacteria, UA FEW (*)    All other components within normal limits  POCT I-STAT, CHEM 8 - Abnormal; Notable for the following:    Glucose, Bld 159 (*)    Hemoglobin 16.3 (*)    HCT 48.0 (*)    All other components within normal limits  LIPASE, BLOOD   Ct Abdomen Pelvis Wo Contrast  02/21/2013   *RADIOLOGY REPORT*  Clinical Data: Larey Seat.  Abdominal and pelvic pain.  CT ABDOMEN AND PELVIS WITHOUT CONTRAST  Technique:  Multidetector CT imaging of the abdomen and pelvis was performed following the standard protocol without intravenous contrast.  Comparison: CT right hip 02/19/2013.  Findings: The lung bases are clear.  No pleural effusion and/or worrisome pulmonary nodule.  A few small tiny subpleural nodules are unchanged since the prior abdominal CT scan from 05/19/2009.  The unenhanced appearance of the liver is unremarkable.  Cystic change noted near the intrahepatic IVC.  No worrisome hepatic lesions or intrahepatic biliary dilatation.  The gallbladder is surgically absent.  No common bile duct dilatation.  The pancreas is unremarkable.  The spleen is normal.  The adrenal glands and kidneys are unremarkable.  The stomach, duodenum, small bowel and colon are unremarkable except for a large amount of stool throughout the colon suggesting constipation.  No inflammatory changes or mass lesions.  No small bowel obstruction.  The terminal ileum is normal.  There are surgical changes from an appendectomy.  No mesenteric or retroperitoneal mass or adenopathy.  The aorta is normal in caliber.  Advanced  atherosclerotic calcifications are noted.  The uterus is surgically absent.  No pelvic mass or adenopathy.  No free pelvic fluid collections.  The bladder appears normal.  No inguinal mass or hernia.   There are remote vertebral augmentation changes at L1 and L3 (please note the patient has lumbarization of S1).  There is a new/acute L4 compression fracture involving the superior endplate. No retropulsion or canal compromise. There is a small amount of paraspinal hematoma.  IMPRESSION:  1.  New L4 compression fracture.  No retropulsion or canal compromise. 2.  Remote vertebral augmentation changes  at L1 and L3. 3.  No acute abdominal/pelvic findings. 4.  Large amount of stool throughout the colon suggesting constipation. 5.  Advanced atherosclerotic calcifications involving the aorta and branch vessels.   Original Report Authenticated By: Rudie Meyer, M.D.   1. Compression fracture of L4 lumbar vertebra, closed, initial encounter   2. Constipation   3. Abdominal pain   4. Fall, subsequent encounter     MDM  I doubt any other EMC precluding discharge at this time including, but not necessarily limited to the following:cauda equina syndrome.  Hurman Horn, MD 02/22/13 1250

## 2013-02-21 NOTE — Progress Notes (Signed)
[facility] chosen for home health.  Home health orders faxed to [facility] at 1941pm, confirmation received at 1945pm.  [facility] left voice message on patient's cell phone confirming home health services set up.

## 2013-02-21 NOTE — Telephone Encounter (Signed)
Patient Information:  Caller Name: Shantay  Phone: [phone]  Patient: [name], [name]  Gender: Female  DOB: 08/09/1930  Age: 79 Years  PCP: Darryll Capers (Adults only)  Office Follow Up:  Does the office need to follow up with this patient?: Yes  Instructions For The Office: Please review and contact patient at  [phone].  RN Note:  Does not feel she can make it to office to be seen.  Declines ER visit - says has already been there and they did not do MRI. Requesting order for [facility] to come to her for evaluation.  Symptoms  Reason For Call & Symptoms: Went for walk on Sat 6/28, stood on Right leg and put Left foot up on table to tie shoe lace and fell hitting Right hip on brick, had to crawl to phone.  Seen in ER, had tests and told bruised bone.  Pain continues in lumbar area and Right hip, feels pain worse than before.  Right knee not holding her up - if turns foot inward can walk better but if usual position or outward can't bear weight at all.  Up moaning all night last night.  Pain today 7/2 at 10+ over abdomen, lower back, Right hip.  Also has not had BM for 4 days.  Reviewed Health History In EMR: Yes  Reviewed Medications In EMR: Yes  Reviewed Allergies In EMR: Yes  Reviewed Surgeries / Procedures: Yes  Date of Onset of Symptoms: 02/17/2013  Treatments Tried: Has applied cold and moist heat which only helps a little  Treatments Tried Worked: No  Guideline(s) Used:  Hip Injury  Disposition Per Guideline:   Go to ED Now (or to Office with PCP Approval)  Reason For Disposition Reached:   Severe pain (e.g., excruciating)  Advice Given:  N/A  Patient Refused Recommendation:  Patient Refused Care Advice  Patient declines to go to ER, declines OV.  Asking for order for [facility] to assess her.

## 2013-02-21 NOTE — Progress Notes (Signed)
   CARE MANAGEMENT ED NOTE 02/21/2013  Patient:  [name], [name]   Account Number:  [id number]  Date Initiated:  02/21/2013  Documentation initiated by:  Radford Pax  Subjective/Objective Assessment:   Patient presents to ED post fall     Subjective/Objective Assessment Detail:     Action/Plan:   Action/Plan Detail:   Anticipated DC Date:  02/21/2013     Status Recommendation to Physician:   Result of Recommendation:    Other ED Services  Consult Working Plan    DC Planning Services  CM consult  Other    Choice offered to / List presented to:  C-3 Spouse     HH arranged  HH-1 RN  HH-2 PT  HH-3 OT  HH-4 NURSE'S AIDE  HH-6 SOCIAL WORKER      HH agency  [facility]    Status of service:  Completed, signed off  ED Comments:   ED Comments Detail:  EDCM asked to see patient by EDP Dr. Teena Irani regarding possible home health needs.  Patient's husband at bedside. Explained to patient that we could set up an RN, PT, OT, and a Child psychotherapist for her to come see her at home. Patient reports that she would like to go to our rehab facility.  Expalined to patient that she would be unable to go to a facility straight from the ED, that she would have to be admittted to the hospital and then the rehab team would have to assess her and then qualify her.  EDCM went on to explain that she could get physical therapy at home and also, if she still wanted to go to a rehab facility, the home health social worker could help facilitate that from home.  Patient is agreeable to home health services. Patient's husband chose [facility].

## 2013-02-21 NOTE — ED Notes (Signed)
Per EMS report patient presents with generalized pain, stemming from a fall this past Sunday, patient was seen at [facility] ED last Monday and no fractures/deformities were found. Per EMS report patient has not had a bowel movement in three days.

## 2013-02-21 NOTE — Telephone Encounter (Signed)
Spoke with Dr Kirtland Bouchard and per Dr Kirtland Bouchard ok for [facility] to assist.  Also pt should take miralax bid until she has a bowel movement.  Pt aware, referral order placed for [facility]

## 2013-02-21 NOTE — Telephone Encounter (Signed)
Patient Information:  Caller Name: Para March  Phone: [phone]  Patient: Selena, [name]  Gender: Female  DOB: February 26, 1930  Age: 79 Years  PCP: Darryll Capers (Adults only)  Office Follow Up:  Does the office need to follow up with this patient?: No  Instructions For The Office: N/A  RN Note:  Pt unable to make it to car, or sit. Will call ambulance to come get her for transport now.  Symptoms  Reason For Call & Symptoms: Pt calling regarding back pain after a fall on 02/17/13 or 02/18/13. Pt uses walker. Unable to stand upright. Seen in ED on 02/19/13; Bruised  hip bone. Pt now unable to pass urine or stool in a sitting position. Pain med not helping at all.  Reviewed Health History In EMR: Yes  Reviewed Medications In EMR: Yes  Reviewed Allergies In EMR: Yes  Reviewed Surgeries / Procedures: Yes  Date of Onset of Symptoms: 02/18/2013  Treatments Tried: Percocet, Soma  Treatments Tried Worked: No  Guideline(s) Used:  Back Injury  Disposition Per Guideline:   Go to ED Now  Reason For Disposition Reached:   Sounds like a serious injury to the triager  Advice Given:  N/A  Patient Will Follow Care Advice:  YES

## 2013-02-21 NOTE — ED Notes (Signed)
Patient reports that she fell on Sunday, was seen on Monday, and called the EMS today because she wanted a nurse and her doctor "would not prescribe her a nurse without being seen." Patient denies wanting a home health nurse, but that she wants someone other than her husband to nurse her today. Patient reports abdominal pain and ataxia starting with fall.

## 2013-02-21 NOTE — ED Notes (Signed)
Patient transported to CT 

## 2013-02-22 ENCOUNTER — Telehealth: Payer: Self-pay | Admitting: Internal Medicine

## 2013-02-22 NOTE — Telephone Encounter (Signed)
Left message to advise nurse, per Dr. Lovell Sheehan, it is ok for pt to have bedside commode today. Advised to call back with questions or concerns

## 2013-02-22 NOTE — Telephone Encounter (Signed)
Per Marlise Eves from [facility] pt needs order for bed side commode today. Per sheila verbal order will do.

## 2013-02-26 ENCOUNTER — Telehealth: Payer: Self-pay | Admitting: Internal Medicine

## 2013-02-26 DIAGNOSIS — S32000D Wedge compression fracture of unspecified lumbar vertebra, subsequent encounter for fracture with routine healing: Secondary | ICD-10-CM

## 2013-02-26 MED ORDER — HYDROMORPHONE HCL 4 MG PO TABS: 4.0000 mg | ORAL_TABLET | ORAL | Status: DC | PRN

## 2013-02-26 NOTE — Telephone Encounter (Signed)
Pt fell 7/2 and hurt her hip. Pt went to ED.  Husband states MD says there is a new fracture in L4. (Compression fracture). Pt states she was RX'd HYDROmorphone (DILAUDID) 4 MG tablet. Pt was instructed to fu w/ Dr Lovell Sheehan, but pt states she is in too much pain to come in.  Pt would like a refill of this med. Pls advise.

## 2013-02-26 NOTE — Telephone Encounter (Signed)
Per dr Lovell Sheehan- set up with interventional radiology for kypohplasty. And may have dilaudid refill.pt informed

## 2013-03-01 ENCOUNTER — Other Ambulatory Visit (HOSPITAL_COMMUNITY): Payer: Self-pay | Admitting: Radiology

## 2013-03-01 ENCOUNTER — Telehealth: Payer: Self-pay | Admitting: Internal Medicine

## 2013-03-01 ENCOUNTER — Other Ambulatory Visit: Payer: Self-pay | Admitting: Radiology

## 2013-03-01 MED ORDER — HYDROMORPHONE HCL 4 MG PO TABS: 4.0000 mg | ORAL_TABLET | ORAL | Status: DC | PRN

## 2013-03-01 NOTE — Telephone Encounter (Signed)
PT husband called in regard to the pt's HYDROmorphone (DILAUDID) 4 MG tablet RX. He states that she still has 23 left right now, but if she continues to take it every 4 hours, she will run out Sunday afternoon. He states that if they do not need it, they will not fill it, but he would like to be prepared. He states that she finally got an appointment with Dr. Thayer Jew, for tomorrow. Please assist.

## 2013-03-01 NOTE — Telephone Encounter (Signed)
Husband informed dr Lovell Sheehan out of the office until tomorrow,but there sh ould not be a problem and script will be ready after noon tomorrow

## 2013-03-02 ENCOUNTER — Ambulatory Visit (HOSPITAL_COMMUNITY)
Admission: RE | Admit: 2013-03-02 | Discharge: 2013-03-02 | Disposition: A | Discharge status: Home / Self Care | Payer: Medicare Other | Source: Ambulatory Visit | Attending: Internal Medicine | Admitting: Internal Medicine

## 2013-03-02 DIAGNOSIS — S32000D Wedge compression fracture of unspecified lumbar vertebra, subsequent encounter for fracture with routine healing: Secondary | ICD-10-CM

## 2013-03-06 ENCOUNTER — Encounter (HOSPITAL_COMMUNITY): Payer: Self-pay | Admitting: Pharmacy Technician

## 2013-03-06 ENCOUNTER — Other Ambulatory Visit (HOSPITAL_COMMUNITY): Payer: Self-pay | Admitting: Interventional Radiology

## 2013-03-06 DIAGNOSIS — IMO0002 Reserved for concepts with insufficient information to code with codable children: Secondary | ICD-10-CM

## 2013-03-06 DIAGNOSIS — M549 Dorsalgia, unspecified: Secondary | ICD-10-CM

## 2013-03-07 ENCOUNTER — Other Ambulatory Visit: Payer: Self-pay | Admitting: Radiology

## 2013-03-07 ENCOUNTER — Ambulatory Visit (HOSPITAL_COMMUNITY)
Admission: RE | Admit: 2013-03-07 | Discharge: 2013-03-07 | Disposition: A | Discharge status: Home / Self Care | Payer: Medicare Other | Source: Ambulatory Visit | Attending: Interventional Radiology | Admitting: Interventional Radiology

## 2013-03-07 DIAGNOSIS — IMO0002 Reserved for concepts with insufficient information to code with codable children: Secondary | ICD-10-CM

## 2013-03-07 DIAGNOSIS — M549 Dorsalgia, unspecified: Secondary | ICD-10-CM

## 2013-03-08 ENCOUNTER — Encounter (HOSPITAL_COMMUNITY): Payer: Self-pay

## 2013-03-08 ENCOUNTER — Ambulatory Visit (HOSPITAL_COMMUNITY)
Admission: RE | Admit: 2013-03-08 | Discharge: 2013-03-08 | Disposition: A | Discharge status: Home / Self Care | Payer: Medicare Other | Source: Ambulatory Visit | Attending: Interventional Radiology | Admitting: Interventional Radiology

## 2013-03-08 DIAGNOSIS — D45 Polycythemia vera: Secondary | ICD-10-CM | POA: Insufficient documentation

## 2013-03-08 DIAGNOSIS — K645 Perianal venous thrombosis: Secondary | ICD-10-CM | POA: Insufficient documentation

## 2013-03-08 DIAGNOSIS — F329 Major depressive disorder, single episode, unspecified: Secondary | ICD-10-CM | POA: Insufficient documentation

## 2013-03-08 DIAGNOSIS — S32009A Unspecified fracture of unspecified lumbar vertebra, initial encounter for closed fracture: Secondary | ICD-10-CM | POA: Insufficient documentation

## 2013-03-08 DIAGNOSIS — E785 Hyperlipidemia, unspecified: Secondary | ICD-10-CM | POA: Insufficient documentation

## 2013-03-08 DIAGNOSIS — Z87891 Personal history of nicotine dependence: Secondary | ICD-10-CM | POA: Insufficient documentation

## 2013-03-08 DIAGNOSIS — K589 Irritable bowel syndrome without diarrhea: Secondary | ICD-10-CM | POA: Insufficient documentation

## 2013-03-08 DIAGNOSIS — F3289 Other specified depressive episodes: Secondary | ICD-10-CM | POA: Insufficient documentation

## 2013-03-08 DIAGNOSIS — K5730 Diverticulosis of large intestine without perforation or abscess without bleeding: Secondary | ICD-10-CM | POA: Insufficient documentation

## 2013-03-08 DIAGNOSIS — W19XXXA Unspecified fall, initial encounter: Secondary | ICD-10-CM | POA: Insufficient documentation

## 2013-03-08 DIAGNOSIS — K644 Residual hemorrhoidal skin tags: Secondary | ICD-10-CM | POA: Insufficient documentation

## 2013-03-08 DIAGNOSIS — E079 Disorder of thyroid, unspecified: Secondary | ICD-10-CM | POA: Insufficient documentation

## 2013-03-08 DIAGNOSIS — F411 Generalized anxiety disorder: Secondary | ICD-10-CM | POA: Insufficient documentation

## 2013-03-08 DIAGNOSIS — Z853 Personal history of malignant neoplasm of breast: Secondary | ICD-10-CM | POA: Insufficient documentation

## 2013-03-08 DIAGNOSIS — M810 Age-related osteoporosis without current pathological fracture: Secondary | ICD-10-CM | POA: Insufficient documentation

## 2013-03-08 LAB — BASIC METABOLIC PANEL
Chloride: 102 mEq/L (ref 96–112)
GFR calc Af Amer: 90 mL/min — ABNORMAL LOW (ref >90)
GFR calc non Af Amer: 77 mL/min — ABNORMAL LOW (ref >90)
Sodium: 140 mEq/L (ref 135–145)

## 2013-03-08 LAB — CBC
HCT: 45.4 % (ref 36.0–46.0)
Hemoglobin: 15.4 g/dL — ABNORMAL HIGH (ref 12.0–15.0)
RDW: 12.6 % (ref 11.5–15.5)
WBC: 5.2 10*3/uL (ref 4.0–10.5)

## 2013-03-08 LAB — PROTIME-INR
INR: 0.99 (ref 0.00–1.49)
Prothrombin Time: 12.9 seconds (ref 11.6–15.2)

## 2013-03-08 MED ORDER — MIDAZOLAM HCL 5 MG/5ML IJ SOLN
INTRAMUSCULAR | Status: AC | PRN
  Administered 2013-03-08: 1 mg via INTRAVENOUS

## 2013-03-08 MED ORDER — CEFAZOLIN SODIUM-DEXTROSE 2-3 GM-% IV SOLR
2.0000 g | Freq: Once | INTRAVENOUS | Status: AC
  Administered 2013-03-08: 2 g via INTRAVENOUS
  Filled 2013-03-08: qty 50

## 2013-03-08 MED ORDER — MIDAZOLAM HCL 2 MG/2ML IJ SOLN
INTRAMUSCULAR | Status: AC
  Filled 2013-03-08: qty 6

## 2013-03-08 MED ORDER — HYDROMORPHONE HCL PF 1 MG/ML IJ SOLN
INTRAMUSCULAR | Status: AC | PRN
  Administered 2013-03-08 (×5): 1 mg

## 2013-03-08 MED ORDER — HYDRALAZINE HCL 20 MG/ML IJ SOLN
INTRAMUSCULAR | Status: AC
  Filled 2013-03-08: qty 1

## 2013-03-08 MED ORDER — IOHEXOL 300 MG/ML  SOLN
50.0000 mL | Freq: Once | INTRAMUSCULAR | Status: AC | PRN
  Administered 2013-03-08: 1 mL via INTRAVENOUS

## 2013-03-08 MED ORDER — DIPHENHYDRAMINE HCL 50 MG/ML IJ SOLN
INTRAMUSCULAR | Status: AC
  Administered 2013-03-08: 50 mg via INTRAVENOUS
  Filled 2013-03-08: qty 1

## 2013-03-08 MED ORDER — FLUMAZENIL 0.5 MG/5ML IV SOLN
INTRAVENOUS | Status: AC
  Filled 2013-03-08: qty 5

## 2013-03-08 MED ORDER — NALOXONE HCL 1 MG/ML IJ SOLN
INTRAMUSCULAR | Status: AC
  Filled 2013-03-08: qty 2

## 2013-03-08 MED ORDER — HYDROMORPHONE HCL PF 1 MG/ML IJ SOLN
INTRAMUSCULAR | Status: AC
  Filled 2013-03-08: qty 3

## 2013-03-08 MED ORDER — SODIUM CHLORIDE 0.9 % IV SOLN
50.0000 mg | Freq: Once | INTRAVENOUS | Status: DC
  Filled 2013-03-08: qty 1

## 2013-03-08 MED ORDER — CHLORHEXIDINE GLUCONATE 4 % EX LIQD
CUTANEOUS | Status: AC
  Filled 2013-03-08: qty 30

## 2013-03-08 MED ORDER — HYDRALAZINE HCL 20 MG/ML IJ SOLN
INTRAMUSCULAR | Status: AC | PRN
  Administered 2013-03-08 (×2): 5 mg via INTRAVENOUS

## 2013-03-08 MED ORDER — FENTANYL CITRATE 0.05 MG/ML IJ SOLN
INTRAMUSCULAR | Status: AC
  Filled 2013-03-08: qty 6

## 2013-03-08 MED ORDER — MIDAZOLAM HCL 2 MG/2ML IJ SOLN
INTRAMUSCULAR | Status: AC
  Filled 2013-03-08: qty 4

## 2013-03-08 MED ORDER — FENTANYL CITRATE 0.05 MG/ML IJ SOLN
INTRAMUSCULAR | Status: AC | PRN
  Administered 2013-03-08 (×3): 25 ug via INTRAVENOUS
  Administered 2013-03-08: 50 ug via INTRAVENOUS
  Administered 2013-03-08 (×3): 25 ug via INTRAVENOUS

## 2013-03-08 MED ORDER — FENTANYL CITRATE 0.05 MG/ML IJ SOLN
INTRAMUSCULAR | Status: AC
  Filled 2013-03-08: qty 4

## 2013-03-08 MED ORDER — SODIUM CHLORIDE 0.9 % IV SOLN
Freq: Once | INTRAVENOUS | Status: AC
  Administered 2013-03-08: 09:00:00 via INTRAVENOUS

## 2013-03-08 MED ORDER — MIDAZOLAM HCL 2 MG/2ML IJ SOLN
INTRAMUSCULAR | Status: AC | PRN
  Administered 2013-03-08 (×5): 1 mg via INTRAVENOUS

## 2013-03-08 MED ORDER — TOBRAMYCIN SULFATE 1.2 G IJ SOLR
INTRAMUSCULAR | Status: AC
  Filled 2013-03-08: qty 1.2

## 2013-03-08 NOTE — ED Notes (Signed)
Patient denies pain and is resting comfortably.  

## 2013-03-08 NOTE — ED Notes (Signed)
Patient denies pain and is resting comfortably with eyes closed, asleep 

## 2013-03-08 NOTE — H&P (Signed)
Chief Complaint: "I am here today for a vertebroplasty." HPI: [name] is an 79 y.o. female with PMHx Lumbar compression fractures L1 and L3 in which she has had vertebroplasty performed. Patient suffered a fall 02/18/13, she c/o severe 9/10 lower back pain radiating to sacral area only and presented to the ED. Imaging was performed and revealed compression fracture at L4. The patient met with Dr. Corliss Skains on 03/02/13 and discussed the images and plan and decided a vertebroplasty and possible kyphoplasty would be performed today. Patient states she is in 10/10 pain this morning c/o lower back pain radiating in sacral region with numbness down her lower extremities B/L. She denies any loss of bowel or bladder function. She currently takes dilaudid at home for her pain relief. She denies any chest pain, shortness of breath, recent illness, fever or chills. The patient denies any blood in her stool or urine.   Past Medical History:  Past Medical History  Diagnosis Date  . ANXIETY 10/13/2007  . BACK PAIN 03/01/2007  . BREAST CANCER, HX OF 12/06/2007  . CONSTIPATION 01/06/2009  . DIVERTICULOSIS, COLON 05/11/2005  . External hemorrhoids without mention of complication 05/11/2005  . FATIGUE 08/07/2008  . HEMORRHOIDS, INTERNAL 05/11/2005  . Hypercalcemia 04/23/2009  . HYPERLIPIDEMIA NEC/NOS 03/01/2007  . HYPERTENSION 10/13/2007  . Impaired fasting glucose 11/14/2008  . INTERSTITIAL CYSTITIS 09/06/2007  . Irritable bowel syndrome 01/06/2009  . LIVER MASS 05/15/2009  . OSTEOPOROSIS 05/09/2009  . PARESTHESIA 09/06/2007  . POLYCYTHEMIA 04/23/2009  . STENOSIS, SPINAL, UNSPC REGION 03/01/2007  . Thyroid disease   . Diverticular disease   . Allergic     Anectine  . Complication of anesthesia     family aggergy to annectine  . DEPRESSION 10/13/2007  . Cancer     RIGHT mastectomy with node dissection    Past Surgical History:  Past Surgical History  Procedure Laterality Date  . Back surgery    .  Cholecystectomy    . Abdominal hysterectomy    . Breast surgery      mastectomy -right  . Cervical spine surgery    . Fixation kyphoplasty lumbar spine    . Parathyroid exploration  06/29/2011    Procedure: PARATHYROID EXPLORATION;  Surgeon: Velora Heckler, MD;  Location: [facility];  Service: General;  Laterality: Left;  Left Superior Parathyroidectomy  . Parathyroidectomy  06/29/11  . Cortisone injection  10/16/12    left knee    Family History:  Family History  Problem Relation Age of Onset  . Heart disease Sister   . Hypertension Sister   . Asthma Sister   . Cancer Maternal Aunt     breast  . Cancer Paternal Aunt     ovarian    Social History:  reports that she quit smoking about 35 years ago. Her smoking use included Cigarettes. She has a 42 pack-year smoking history. She has never used smokeless tobacco. She reports that she does not drink alcohol or use illicit drugs.  Allergies:  Allergies  Allergen Reactions  . Anectine (Succinylcholine Chloride) Other (See Comments)    Reaction:  Patient states that her body wasn't functioning.   . Decongest     Elevated blood pressure, "Doesn't work"  . Hydrocodone-Acetaminophen     REACTION: Hallucinations, paranoia, amnesia  . Iodine     REACTION: Hives, asthma  . Naproxen Sodium     REACTION: Asthma  . Other     PATIENT STATES THAT SHE CAN NOT TAKE GENERIC MEDICATIONS. She  also can not take anti-depressants.  . Oxycodone Hcl     REACTION: Hallucinations, paranoia, amnesia  . Pentosan Polysulfate Sodium     REACTION: unspecified per patient  . Pseudoephedrine     REACTION: Palpitations  . Succinylcholine     REACTION: "Can't wake up".  . Tape Dermatitis    Red inflammed  . Tramadol Hcl     Pt allergic to ANY GENERIC Rx  . Vioxx (Rofecoxib)     "Doesn't work for pain"  . Shellfish Allergy Swelling and Rash      Medication List    ASK your doctor about these medications       ALOE CAPE Misc  Take 1 tablet by mouth  daily as needed.     calcium carbonate 600 MG Tabs  Commonly known as:  OS-CAL  Take 600 mg by mouth daily.     CALENDULA Crea  Apply 1 application topically as needed (For feet.). Applies to feet.     celecoxib 200 MG capsule  Commonly known as:  CELEBREX  Take 200 mg by mouth daily.     cholecalciferol 1000 UNITS tablet  Commonly known as:  VITAMIN D  Take 5,000 Units by mouth daily.     CYSTEX PO  Take 1 tablet by mouth daily as needed (interstitial cystitis).     diazepam 5 MG tablet  Commonly known as:  VALIUM  Take 5 mg by mouth every 8 (eight) hours as needed for anxiety. BRAND NAMES ONLY. For anxiety.     EDARBI 80 MG Tabs  Generic drug:  Azilsartan Medoxomil  Take 80 mg by mouth daily as needed (Blood pressure spikes). For blood pressure spikes.     estradiol 0.1 MG/GM vaginal cream  Commonly known as:  ESTRACE  Place 2 g vaginally daily.     estrogen (conjugated)-medroxyprogesterone 0.3-1.5 MG per tablet  Commonly known as:  PREMPRO  Take 1 tablet by mouth every evening.     HYDROmorphone 4 MG tablet  Commonly known as:  DILAUDID  Take 4 mg by mouth every 4 (four) hours as needed for pain.     KAOPECTATE PO  Take 5 mLs by mouth as needed.     OVER THE COUNTER MEDICATION  Take 1 tablet by mouth every other day. Perfect Prenatal Multivitamin. She alternates with taking a Multivitamin tablet.     OVER THE COUNTER MEDICATION  Take 1 capsule by mouth daily as needed (stomach upset.). GI Flora. This contains Lactobacillus acidophilus, L. rhamnosus, L. casei, and Bifidobacterium longum.     oxyCODONE-acetaminophen 5-325 MG per tablet  Commonly known as:  PERCOCET/ROXICET  Take 1-2 tablets by mouth every 4 (four) hours as needed for pain. Severe pain     RECLAST IV  Inject into the vein. Once a year.     SOMA 250 MG tablet  Generic drug:  carisoprodol  Take 250 mg by mouth daily as needed (Pt is also on the 350 mg Soma.). For muscle spasms.      carisoprodol 350 MG tablet  Commonly known as:  SOMA  Take 350 mg by mouth at bedtime as needed for muscle spasms (Pt is also taking 250 mg Soma.).        Please HPI for pertinent positives, otherwise complete 10 system ROS negative.  Physical Exam: There were no vitals taken for this visit. There is no weight on file to calculate BMI.   General Appearance:  Alert, cooperative, no distress, appears stated age  Head:  Normocephalic, without obvious abnormality, atraumatic  ENT: Unremarkable  Neck: Supple, symmetrical, trachea midline  Lungs:   Clear to auscultation bilaterally, no w/r/r, respirations unlabored without use of accessory muscles.  Chest Wall:  No tenderness or deformity  Heart:  Regular rate and rhythm, S1, S2 normal, no murmur, rub or gallop.  Abdomen:   Soft, non-tender, non distended.  Extremities: Extremities normal, atraumatic, no cyanosis or edema  Pulses: 2+ and symmetric  Neurologic: Normal affect, no gross deficits.   No results found for this or any previous visit (from the past 48 hour(s)). No results found.  Assessment/Plan Compression fracture Lumbar 4 region after fall 02/18/13. History of L1 and L3 vertebroplasty. Patient scheduled for vertebroplasty and possible kyphoplasty today. Risks and Benefits discussed with the patient and the patient's husband. All of the patient's questions were answered, patient is agreeable to proceed. Consent signed and in chart. Labs reviewed.   Pattricia Boss D PA-C 03/08/2013, 9:08 AM

## 2013-03-08 NOTE — ED Notes (Signed)
Dressing to mid back clean dry and intact

## 2013-03-08 NOTE — Procedures (Signed)
S/P L4 KP 

## 2013-03-13 ENCOUNTER — Telehealth: Payer: Self-pay | Admitting: Internal Medicine

## 2013-03-13 NOTE — Telephone Encounter (Signed)
talk with both amber and patient. Pt expects pt to do exercise in bed while she lays there--will discuss with dr Lovell Sheehan when he returns

## 2013-03-13 NOTE — Telephone Encounter (Signed)
Requesting order for physical therapy and occupational therapy.  Please advise if ok

## 2013-03-13 NOTE — Telephone Encounter (Signed)
Pt called to express her dissatisfaction with the fact that she has received no home health since the time of her surgery last month. FYI.

## 2013-03-19 ENCOUNTER — Other Ambulatory Visit (HOSPITAL_COMMUNITY): Payer: Self-pay | Admitting: Interventional Radiology

## 2013-03-19 DIAGNOSIS — IMO0002 Reserved for concepts with insufficient information to code with codable children: Secondary | ICD-10-CM

## 2013-03-19 DIAGNOSIS — M549 Dorsalgia, unspecified: Secondary | ICD-10-CM

## 2013-03-23 ENCOUNTER — Ambulatory Visit (HOSPITAL_COMMUNITY)
Admission: RE | Admit: 2013-03-23 | Discharge: 2013-03-23 | Disposition: A | Discharge status: Home / Self Care | Payer: Medicare Other | Source: Ambulatory Visit | Attending: Interventional Radiology | Admitting: Interventional Radiology

## 2013-03-23 DIAGNOSIS — IMO0002 Reserved for concepts with insufficient information to code with codable children: Secondary | ICD-10-CM

## 2013-03-23 DIAGNOSIS — M549 Dorsalgia, unspecified: Secondary | ICD-10-CM

## 2013-03-26 ENCOUNTER — Other Ambulatory Visit (HOSPITAL_COMMUNITY): Payer: Self-pay | Admitting: Interventional Radiology

## 2013-04-16 ENCOUNTER — Other Ambulatory Visit (HOSPITAL_COMMUNITY): Payer: Self-pay | Admitting: Interventional Radiology

## 2013-04-16 ENCOUNTER — Ambulatory Visit (HOSPITAL_COMMUNITY)
Admission: RE | Admit: 2013-04-16 | Discharge: 2013-04-16 | Disposition: A | Discharge status: Home / Self Care | Payer: Medicare Other | Source: Ambulatory Visit | Attending: Interventional Radiology | Admitting: Interventional Radiology

## 2013-04-16 ENCOUNTER — Telehealth (HOSPITAL_COMMUNITY): Payer: Self-pay | Admitting: Interventional Radiology

## 2013-04-16 DIAGNOSIS — M549 Dorsalgia, unspecified: Secondary | ICD-10-CM | POA: Insufficient documentation

## 2013-04-16 DIAGNOSIS — M533 Sacrococcygeal disorders, not elsewhere classified: Secondary | ICD-10-CM

## 2013-04-16 NOTE — Telephone Encounter (Signed)
Called pt to let her know that her insurance had approved her MRI sacral scan. Pt went on an extremely long tangent of how she does want to treat this or have the imaging done and continued on and on about all of her health issues and medications. She talks nonstop about her health issues and why she does not want to treat medically and how multiple doctors that she has seen do treat her for the wrong things. I am unsure how we can help this patient, as she does not appear to really want help. I told her that she did not have to have any procedure in order to have the imaging study performed that this was just a diagnostic study to see what her sacrum looked like. Pt agrees to this and agrees to schedule. JMichaux

## 2013-05-03 ENCOUNTER — Emergency Department (HOSPITAL_COMMUNITY): Payer: Medicare Other

## 2013-05-03 ENCOUNTER — Emergency Department (HOSPITAL_COMMUNITY)
Admission: EM | Admit: 2013-05-03 | Discharge: 2013-05-03 | Disposition: A | Discharge status: Home / Self Care | Payer: Medicare Other | Attending: Emergency Medicine | Admitting: Emergency Medicine

## 2013-05-03 ENCOUNTER — Encounter (HOSPITAL_COMMUNITY): Payer: Self-pay | Admitting: Family Medicine

## 2013-05-03 DIAGNOSIS — Z8639 Personal history of other endocrine, nutritional and metabolic disease: Secondary | ICD-10-CM | POA: Insufficient documentation

## 2013-05-03 DIAGNOSIS — Z87891 Personal history of nicotine dependence: Secondary | ICD-10-CM | POA: Insufficient documentation

## 2013-05-03 DIAGNOSIS — Z8679 Personal history of other diseases of the circulatory system: Secondary | ICD-10-CM | POA: Insufficient documentation

## 2013-05-03 DIAGNOSIS — S0993XA Unspecified injury of face, initial encounter: Secondary | ICD-10-CM | POA: Insufficient documentation

## 2013-05-03 DIAGNOSIS — Y929 Unspecified place or not applicable: Secondary | ICD-10-CM | POA: Insufficient documentation

## 2013-05-03 DIAGNOSIS — F411 Generalized anxiety disorder: Secondary | ICD-10-CM | POA: Insufficient documentation

## 2013-05-03 DIAGNOSIS — Y9389 Activity, other specified: Secondary | ICD-10-CM | POA: Insufficient documentation

## 2013-05-03 DIAGNOSIS — S0100XA Unspecified open wound of scalp, initial encounter: Secondary | ICD-10-CM | POA: Insufficient documentation

## 2013-05-03 DIAGNOSIS — Z23 Encounter for immunization: Secondary | ICD-10-CM | POA: Insufficient documentation

## 2013-05-03 DIAGNOSIS — Z853 Personal history of malignant neoplasm of breast: Secondary | ICD-10-CM | POA: Insufficient documentation

## 2013-05-03 DIAGNOSIS — Z79899 Other long term (current) drug therapy: Secondary | ICD-10-CM | POA: Insufficient documentation

## 2013-05-03 DIAGNOSIS — W208XXA Other cause of strike by thrown, projected or falling object, initial encounter: Secondary | ICD-10-CM | POA: Insufficient documentation

## 2013-05-03 DIAGNOSIS — S0101XA Laceration without foreign body of scalp, initial encounter: Secondary | ICD-10-CM

## 2013-05-03 DIAGNOSIS — N3010 Interstitial cystitis (chronic) without hematuria: Secondary | ICD-10-CM | POA: Insufficient documentation

## 2013-05-03 DIAGNOSIS — I10 Essential (primary) hypertension: Secondary | ICD-10-CM | POA: Insufficient documentation

## 2013-05-03 DIAGNOSIS — Z8719 Personal history of other diseases of the digestive system: Secondary | ICD-10-CM | POA: Insufficient documentation

## 2013-05-03 DIAGNOSIS — Z8739 Personal history of other diseases of the musculoskeletal system and connective tissue: Secondary | ICD-10-CM | POA: Insufficient documentation

## 2013-05-03 DIAGNOSIS — Z862 Personal history of diseases of the blood and blood-forming organs and certain disorders involving the immune mechanism: Secondary | ICD-10-CM | POA: Insufficient documentation

## 2013-05-03 MED ORDER — TETANUS-DIPHTH-ACELL PERTUSSIS 5-2.5-18.5 LF-MCG/0.5 IM SUSP
0.5000 mL | Freq: Once | INTRAMUSCULAR | Status: AC
  Administered 2013-05-03: 0.5 mL via INTRAMUSCULAR
  Filled 2013-05-03: qty 0.5

## 2013-05-03 NOTE — ED Notes (Signed)
[place] Pt was sitting on a bench when the bench gave out and caused her posterior occiput to hit a metal shelf. Pt has approx 1inch hematoma to posterior head, small laceration present. No LOC, A&Ox4. Pt c/o 6/10 pain. Bleeding controlled, pt is not anticoagulated.

## 2013-05-03 NOTE — ED Notes (Signed)
PT comfortable with d/c and f/u instructions. No prescriptions. 

## 2013-05-03 NOTE — ED Provider Notes (Addendum)
CSN: [id number]     Arrival date & time 05/03/13  1327 History   First MD Initiated Contact with Patient 05/03/13 1335     Chief Complaint  Patient presents with  . Head Laceration   (Consider location/radiation/quality/duration/timing/severity/associated sxs/prior Treatment) Patient is a 79 y.o. female presenting with scalp laceration. The history is provided by the patient.  Head Laceration This is a new (sitting on a bench today when it gave way and when it broke it caused a display case to fall on her head) problem. The current episode started less than 1 hour ago. The problem occurs constantly. The problem has not changed since onset.Pertinent negatives include no headaches. Associated symptoms comments: No LOC.  Mild neck pain.  No vision changes.. Nothing aggravates the symptoms. Nothing relieves the symptoms. She has tried nothing for the symptoms. The treatment provided no relief.    Past Medical History  Diagnosis Date  . ANXIETY 10/13/2007  . BACK PAIN 03/01/2007  . BREAST CANCER, HX OF 12/06/2007  . CONSTIPATION 01/06/2009  . DIVERTICULOSIS, COLON 05/11/2005  . External hemorrhoids without mention of complication 05/11/2005  . FATIGUE 08/07/2008  . HEMORRHOIDS, INTERNAL 05/11/2005  . Hypercalcemia 04/23/2009  . HYPERLIPIDEMIA NEC/NOS 03/01/2007  . HYPERTENSION 10/13/2007  . Impaired fasting glucose 11/14/2008  . INTERSTITIAL CYSTITIS 09/06/2007  . Irritable bowel syndrome 01/06/2009  . LIVER MASS 05/15/2009  . OSTEOPOROSIS 05/09/2009  . PARESTHESIA 09/06/2007  . POLYCYTHEMIA 04/23/2009  . STENOSIS, SPINAL, UNSPC REGION 03/01/2007  . Thyroid disease   . Diverticular disease   . Allergic     Anectine  . Complication of anesthesia     family aggergy to annectine  . DEPRESSION 10/13/2007  . Cancer     RIGHT mastectomy with node dissection   Past Surgical History  Procedure Laterality Date  . Back surgery    . Cholecystectomy    . Abdominal hysterectomy    . Breast surgery     mastectomy -right  . Cervical spine surgery    . Fixation kyphoplasty lumbar spine    . Parathyroid exploration  06/29/2011    Procedure: PARATHYROID EXPLORATION;  Surgeon: Velora Heckler, MD;  Location: [facility];  Service: General;  Laterality: Left;  Left Superior Parathyroidectomy  . Parathyroidectomy  06/29/11  . Cortisone injection  10/16/12    left knee   Family History  Problem Relation Age of Onset  . Heart disease Sister   . Hypertension Sister   . Asthma Sister   . Cancer Maternal Aunt     breast  . Cancer Paternal Aunt     ovarian   History  Substance Use Topics  . Smoking status: Former Smoker -- 1.00 packs/day for 42 years    Types: Cigarettes    Quit date: 08/23/1977  . Smokeless tobacco: Never Used  . Alcohol Use: No   OB History   Grav Para Term Preterm Abortions TAB SAB Ect Mult Living                 Review of Systems  Eyes: Negative for photophobia and visual disturbance.  Neurological: Negative for headaches.  All other systems reviewed and are negative.    Allergies  Anectine; Decongest; Hydrocodone-acetaminophen; Iodine; Naproxen sodium; Other; Oxycodone hcl; Pentosan polysulfate sodium; Pseudoephedrine; Succinylcholine; Tape; Tramadol hcl; Vioxx; and Shellfish allergy  Home Medications   Current Outpatient Rx  Name  Route  Sig  Dispense  Refill  . Aloe Vera (ALOE CAPE) MISC   Oral  Take 1 tablet by mouth daily as needed.          . Attapulgite (KAOPECTATE PO)   Oral   Take 5 mLs by mouth as needed.          . calcium carbonate (OS-CAL) 600 MG TABS   Oral   Take 600 mg by mouth daily.         . carisoprodol (SOMA) 250 MG tablet   Oral   Take 250 mg by mouth daily as needed (Pt is also on the 350 mg Soma.). For muscle spasms.         . carisoprodol (SOMA) 350 MG tablet   Oral   Take 350 mg by mouth at bedtime as needed for muscle spasms (Pt is also taking 250 mg Soma.).          Marland Kitchen celecoxib (CELEBREX) 200 MG capsule   Oral    Take 200 mg by mouth daily.         . cholecalciferol (VITAMIN D) 1000 UNITS tablet   Oral   Take 5,000 Units by mouth daily.          . diazepam (VALIUM) 5 MG tablet   Oral   Take 5 mg by mouth every 8 (eight) hours as needed for anxiety. BRAND NAMES ONLY. For anxiety.         . EDARBI 80 MG TABS   Oral   Take 80 mg by mouth daily as needed (Blood pressure spikes). For blood pressure spikes.         Marland Kitchen estradiol (ESTRACE) 0.1 MG/GM vaginal cream   Vaginal   Place 2 g vaginally daily.         Marland Kitchen estrogen, conjugated,-medroxyprogesterone (PREMPRO) 0.3-1.5 MG per tablet   Oral   Take 1 tablet by mouth every evening.         . Homeopathic Products (CALENDULA) CREA   Apply externally   Apply 1 application topically as needed (For feet.). Applies to feet.         Marland Kitchen HYDROmorphone (DILAUDID) 4 MG tablet   Oral   Take 4 mg by mouth every 4 (four) hours as needed for pain.         . Methenamine-Sodium Salicylate (CYSTEX PO)   Oral   Take 1 tablet by mouth daily as needed (interstitial cystitis).          Marland Kitchen OVER THE COUNTER MEDICATION   Oral   Take 1 tablet by mouth every other day. Perfect Prenatal Multivitamin. She alternates with taking a Multivitamin tablet.          Marland Kitchen OVER THE COUNTER MEDICATION   Oral   Take 1 capsule by mouth daily as needed (stomach upset.). GI Flora. This contains Lactobacillus acidophilus, L. rhamnosus, L. casei, and Bifidobacterium longum.         Marland Kitchen oxyCODONE-acetaminophen (PERCOCET) 5-325 MG per tablet   Oral   Take 1-2 tablets by mouth every 4 (four) hours as needed for pain. Severe pain         . Zoledronic Acid (RECLAST IV)   Intravenous   Inject into the vein. Once a year.          BP 191/113  Pulse 112  Temp(Src) 98.7 F (37.1 C) (Oral)  Resp 18  Ht 5\' 4"  (1.626 m)  Wt 166 lb (75.297 kg)  BMI 28.48 kg/m2  SpO2 96% Physical Exam  Nursing note and vitals reviewed. Constitutional: She is oriented to person,  place, and time. She appears well-developed and well-nourished. No distress.  HENT:  Head: Normocephalic. Head is with laceration.    Eyes: EOM are normal. Pupils are equal, round, and reactive to light.  Neck: Normal range of motion. Neck supple. Spinous process tenderness present. No muscular tenderness present.    Mild C2/3 tenderness   Cardiovascular: Normal rate.   Pulmonary/Chest: Effort normal.  Neurological: She is alert and oriented to person, place, and time.  Skin: Skin is warm and dry. No rash noted. No erythema.  Psychiatric: She has a normal mood and affect. Her behavior is normal.    ED Course  Procedures (including critical care time) Labs Review Labs Reviewed - No data to display Imaging Review Dg Cervical Spine Complete  05/03/2013   CLINICAL DATA:  Head and neck pain, trauma  EXAM: CERVICAL SPINE  4+ VIEWS  COMPARISON:  None.  FINDINGS: No prevertebral soft tissue soft tissue swelling. There is bulky osteophytosis anterior to the C5, C6, C7 vertebral bodies. No evidence of subluxation. Neural foramina are pain. Open mouth odontoid view demonstrates normal alignment of the lateral masses of C1 on C2.  IMPRESSION: Negative cervical spine radiographs.   Electronically Signed   By: Genevive Bi M.D.   On: 05/03/2013 14:46   LACERATION REPAIR Performed by: Gwyneth Sprout Authorized by: Gwyneth Sprout Consent: Verbal consent obtained. Risks and benefits: risks, benefits and alternatives were discussed Consent given by: patient Patient identity confirmed: provided demographic data Prepped and Draped in normal sterile fashion Wound explored  Laceration Location: scalp  Laceration Length: 1cm  No Foreign Bodies seen or palpated  Anesthesia: local infiltration  Local anesthetic: lidocaine 1% without epinephrine  Anesthetic total: 1 ml  Irrigation method: syringe Amount of cleaning: standard  Skin closure: staple  Number of sutures: 1  Technique:  staple  Patient tolerance: Patient tolerated the procedure well with no immediate complications.   MDM   1. Scalp laceration, initial encounter     Pt with hx of head injury bc a bench she was sitting on broke and a shelf above her fell off and hit her in the head.  Small puncture wound to the back of the head but no LoC and not on anticoagulation.  Well appearing her and NV intact.  Mild C2-3 spine tenderness and will get plain films.  No indication for CT at this time.    Tetanus updated.  Wound repaired.  Cervical films neg.  Pt d/ced home.    Gwyneth Sprout, MD 05/03/13 1459  Gwyneth Sprout, MD 05/03/13 1500

## 2013-05-03 NOTE — ED Notes (Signed)
Pt returned from X-ray.  

## 2013-05-08 ENCOUNTER — Telehealth: Payer: Self-pay | Admitting: Family

## 2013-05-08 NOTE — Telephone Encounter (Signed)
Pt has one staple in the back of head that needs to be removed. i had scheduled a 15 min.  Do you need 30 min?

## 2013-05-09 NOTE — Telephone Encounter (Signed)
15 min is fine

## 2013-05-10 ENCOUNTER — Ambulatory Visit (INDEPENDENT_AMBULATORY_CARE_PROVIDER_SITE_OTHER): Payer: Medicare Other | Admitting: Family

## 2013-05-10 ENCOUNTER — Encounter: Payer: Self-pay | Admitting: Family

## 2013-05-10 VITALS — BP 154/80 | HR 124 | Wt 168.0 lb

## 2013-05-10 DIAGNOSIS — Z4802 Encounter for removal of sutures: Secondary | ICD-10-CM

## 2013-05-10 NOTE — Progress Notes (Signed)
Subjective:    Patient ID: [name], female    DOB: 08-23-1930, 79 y.o.   MRN: [id number]  HPI Patient of Dr. Lovell Sheehan in for staple removal. Reports having a staple placed to the back of her head last week after hitting her head on the corner of a wooden cabinet.   Review of Systems  Constitutional: Negative.   Respiratory: Negative.   Cardiovascular: Negative.   Skin: Positive for wound.       Posterior right scalp  Psychiatric/Behavioral: Negative.    Past Medical History  Diagnosis Date  . ANXIETY 10/13/2007  . BACK PAIN 03/01/2007  . BREAST CANCER, HX OF 12/06/2007  . CONSTIPATION 01/06/2009  . DIVERTICULOSIS, COLON 05/11/2005  . External hemorrhoids without mention of complication 05/11/2005  . FATIGUE 08/07/2008  . HEMORRHOIDS, INTERNAL 05/11/2005  . Hypercalcemia 04/23/2009  . HYPERLIPIDEMIA NEC/NOS 03/01/2007  . HYPERTENSION 10/13/2007  . Impaired fasting glucose 11/14/2008  . INTERSTITIAL CYSTITIS 09/06/2007  . Irritable bowel syndrome 01/06/2009  . LIVER MASS 05/15/2009  . OSTEOPOROSIS 05/09/2009  . PARESTHESIA 09/06/2007  . POLYCYTHEMIA 04/23/2009  . STENOSIS, SPINAL, UNSPC REGION 03/01/2007  . Thyroid disease   . Diverticular disease   . Allergic     Anectine  . Complication of anesthesia     family aggergy to annectine  . DEPRESSION 10/13/2007  . Cancer     RIGHT mastectomy with node dissection    History   Social History  . Marital Status: Married    Spouse Name: N/A    Number of Children: N/A  . Years of Education: N/A   Occupational History  . Not on file.   Social History Main Topics  . Smoking status: Former Smoker -- 1.00 packs/day for 42 years    Types: Cigarettes    Quit date: 08/23/1977  . Smokeless tobacco: Never Used  . Alcohol Use: No  . Drug Use: No  . Sexual Activity: Not on file   Other Topics Concern  . Not on file   Social History Narrative  . No narrative on file    Past Surgical History  Procedure Laterality Date  . Back  surgery    . Cholecystectomy    . Abdominal hysterectomy    . Breast surgery      mastectomy -right  . Cervical spine surgery    . Fixation kyphoplasty lumbar spine    . Parathyroid exploration  06/29/2011    Procedure: PARATHYROID EXPLORATION;  Surgeon: Velora Heckler, MD;  Location: [facility];  Service: General;  Laterality: Left;  Left Superior Parathyroidectomy  . Parathyroidectomy  06/29/11  . Cortisone injection  10/16/12    left knee    Family History  Problem Relation Age of Onset  . Heart disease Sister   . Hypertension Sister   . Asthma Sister   . Cancer Maternal Aunt     breast  . Cancer Paternal Aunt     ovarian    Allergies  Allergen Reactions  . Anectine [Succinylcholine Chloride] Other (See Comments)    Reaction:  Patient states that her body wasn't functioning.   . Decongest     Elevated blood pressure, "Doesn't work"  . Hydrocodone-Acetaminophen     REACTION: Hallucinations, paranoia, amnesia  . Iodine     REACTION: Hives, asthma  . Naproxen Sodium     REACTION: Asthma  . Other     PATIENT STATES THAT SHE CAN NOT TAKE GENERIC MEDICATIONS. She also can not take anti-depressants.  Marland Kitchen  Oxycodone Hcl     REACTION: Hallucinations, paranoia, amnesia  . Pentosan Polysulfate Sodium     REACTION: unspecified per patient  . Pseudoephedrine     REACTION: Palpitations  . Succinylcholine     REACTION: "Can't wake up".  . Tape Dermatitis    Red inflammed  . Tramadol Hcl     Pt allergic to ANY GENERIC Rx  . Vioxx [Rofecoxib]     "Doesn't work for pain"  . Shellfish Allergy Swelling and Rash    Current Outpatient Prescriptions on File Prior to Visit  Medication Sig Dispense Refill  . acetaminophen (TYLENOL) 500 MG tablet Take 500-1,000 mg by mouth every 6 (six) hours as needed for pain.      . Aloe Vera (ALOE CAPE) MISC Take 1 tablet by mouth daily.       . Attapulgite (KAOPECTATE PO) Take 5 mLs by mouth daily as needed (for diarrhea).       . calcium carbonate  (OS-CAL) 600 MG TABS Take 600 mg by mouth daily.      . carisoprodol (SOMA) 250 MG tablet Take 250 mg by mouth daily as needed (Pt is also on the 350 mg Soma.). For muscle spasms.      . carisoprodol (SOMA) 350 MG tablet Take 350 mg by mouth at bedtime as needed for muscle spasms (Pt is also taking 250 mg Soma.).       Marland Kitchen celecoxib (CELEBREX) 200 MG capsule Take 200 mg by mouth daily.      . cholecalciferol (VITAMIN D) 1000 UNITS tablet Take 5,000 Units by mouth daily.       . diazepam (VALIUM) 5 MG tablet Take 5 mg by mouth every 8 (eight) hours as needed for anxiety. BRAND NAMES ONLY. For anxiety.      . EDARBI 80 MG TABS Take 80 mg by mouth daily as needed (Blood pressure spikes). For blood pressure spikes.      Marland Kitchen estradiol (ESTRACE) 0.1 MG/GM vaginal cream Place 2 g vaginally daily as needed (for dryness).       Marland Kitchen estrogen, conjugated,-medroxyprogesterone (PREMPRO) 0.3-1.5 MG per tablet Take 1 tablet by mouth every evening.      . Homeopathic Products (CALENDULA) CREA Apply 1 application topically as needed (For feet.). Applies to feet.      . Methenamine-Sodium Salicylate (CYSTEX PO) Take 1 tablet by mouth daily as needed (interstitial cystitis).       Marland Kitchen OVER THE COUNTER MEDICATION Take 1 tablet by mouth every other day. Perfect Prenatal Multivitamin. She alternates with taking a Multivitamin tablet.       Marland Kitchen OVER THE COUNTER MEDICATION Take 1 capsule by mouth daily as needed (stomach upset.). GI Flora. This contains Lactobacillus acidophilus, L. rhamnosus, L. casei, and Bifidobacterium longum.      Marland Kitchen oxyCODONE-acetaminophen (PERCOCET) 5-325 MG per tablet Take 1-2 tablets by mouth every 4 (four) hours as needed for pain. Severe pain      . VISTARIL 25 MG capsule Take 25 mg by mouth at bedtime.      . Zoledronic Acid (RECLAST IV) Inject into the vein. Once a year.       No current facility-administered medications on file prior to visit.    BP 154/80  Pulse 124  Wt 168 lb (76.204 kg)  BMI  28.82 kg/m2chart    Objective:   Physical Exam  Constitutional: She is oriented to person, place, and time. She appears well-developed and well-nourished.  Cardiovascular: Normal rate, regular rhythm and  normal heart sounds.   Neurological: She is alert and oriented to person, place, and time.  Skin: Skin is warm and dry.  Edges well approximated. One staple removed. Well healed. No drainage or discharge.  Psychiatric: She has a normal mood and affect.          Assessment & Plan:  Assessment: 1. Visit for staple removal  Plan: Recheck as needed.

## 2013-05-15 ENCOUNTER — Encounter: Payer: Self-pay | Admitting: Internal Medicine

## 2013-05-30 ENCOUNTER — Telehealth: Payer: Self-pay | Admitting: Internal Medicine

## 2013-05-30 NOTE — Telephone Encounter (Signed)
Pt is sch for tomorrow °

## 2013-05-30 NOTE — Telephone Encounter (Signed)
She can have a b12 level-

## 2013-05-30 NOTE — Telephone Encounter (Signed)
Pt was reading an article and think she may be vit b deficiency. Pt is having  Some symptoms of deficiency. Can I sch?

## 2013-05-31 ENCOUNTER — Other Ambulatory Visit (INDEPENDENT_AMBULATORY_CARE_PROVIDER_SITE_OTHER): Payer: Medicare Other

## 2013-05-31 DIAGNOSIS — E538 Deficiency of other specified B group vitamins: Secondary | ICD-10-CM

## 2013-05-31 LAB — VITAMIN B12: Vitamin B-12: 570 pg/mL (ref 211–911)

## 2013-06-05 ENCOUNTER — Encounter: Payer: Self-pay | Admitting: *Deleted

## 2013-06-28 ENCOUNTER — Other Ambulatory Visit: Payer: Self-pay

## 2013-07-05 ENCOUNTER — Encounter (HOSPITAL_COMMUNITY): Payer: Self-pay | Admitting: Emergency Medicine

## 2013-07-05 ENCOUNTER — Emergency Department (HOSPITAL_COMMUNITY): Payer: Medicare Other

## 2013-07-05 ENCOUNTER — Emergency Department (HOSPITAL_COMMUNITY)
Admission: EM | Admit: 2013-07-05 | Discharge: 2013-07-05 | Disposition: A | Discharge status: Home / Self Care | Payer: Medicare Other | Attending: Emergency Medicine | Admitting: Emergency Medicine

## 2013-07-05 DIAGNOSIS — Z79899 Other long term (current) drug therapy: Secondary | ICD-10-CM | POA: Insufficient documentation

## 2013-07-05 DIAGNOSIS — Z8719 Personal history of other diseases of the digestive system: Secondary | ICD-10-CM | POA: Insufficient documentation

## 2013-07-05 DIAGNOSIS — Z87891 Personal history of nicotine dependence: Secondary | ICD-10-CM | POA: Insufficient documentation

## 2013-07-05 DIAGNOSIS — Y9389 Activity, other specified: Secondary | ICD-10-CM | POA: Insufficient documentation

## 2013-07-05 DIAGNOSIS — S32591A Other specified fracture of right pubis, initial encounter for closed fracture: Secondary | ICD-10-CM

## 2013-07-05 DIAGNOSIS — F411 Generalized anxiety disorder: Secondary | ICD-10-CM | POA: Insufficient documentation

## 2013-07-05 DIAGNOSIS — E785 Hyperlipidemia, unspecified: Secondary | ICD-10-CM | POA: Insufficient documentation

## 2013-07-05 DIAGNOSIS — R296 Repeated falls: Secondary | ICD-10-CM | POA: Insufficient documentation

## 2013-07-05 DIAGNOSIS — Z87448 Personal history of other diseases of urinary system: Secondary | ICD-10-CM | POA: Insufficient documentation

## 2013-07-05 DIAGNOSIS — I10 Essential (primary) hypertension: Secondary | ICD-10-CM | POA: Insufficient documentation

## 2013-07-05 DIAGNOSIS — S32509A Unspecified fracture of unspecified pubis, initial encounter for closed fracture: Secondary | ICD-10-CM | POA: Insufficient documentation

## 2013-07-05 DIAGNOSIS — Z853 Personal history of malignant neoplasm of breast: Secondary | ICD-10-CM | POA: Insufficient documentation

## 2013-07-05 DIAGNOSIS — Y929 Unspecified place or not applicable: Secondary | ICD-10-CM | POA: Insufficient documentation

## 2013-07-05 DIAGNOSIS — Z862 Personal history of diseases of the blood and blood-forming organs and certain disorders involving the immune mechanism: Secondary | ICD-10-CM | POA: Insufficient documentation

## 2013-07-05 DIAGNOSIS — Z8781 Personal history of (healed) traumatic fracture: Secondary | ICD-10-CM | POA: Insufficient documentation

## 2013-07-05 DIAGNOSIS — M810 Age-related osteoporosis without current pathological fracture: Secondary | ICD-10-CM | POA: Insufficient documentation

## 2013-07-05 HISTORY — DX: Wedge compression fracture of fourth lumbar vertebra, initial encounter for closed fracture: S32.040A

## 2013-07-05 MED ORDER — OXYCODONE-ACETAMINOPHEN 5-325 MG PO TABS: ORAL_TABLET | ORAL | Status: DC

## 2013-07-05 MED ORDER — OXYCODONE-ACETAMINOPHEN 5-325 MG PO TABS
2.0000 | ORAL_TABLET | Freq: Once | ORAL | Status: AC
  Administered 2013-07-05: 2 via ORAL
  Filled 2013-07-05: qty 2

## 2013-07-05 NOTE — ED Notes (Signed)
Pt states she was opening an ice chest in back of car and "found herself on hr R hip on the ground".  She did not lose consciousness, did not trip, etc.  Lower extremities aligned .  Pulses present.  Pt c/o R hip pain and pelvic pain.  Pt's husband said she has been fallen several times in the last few months.

## 2013-07-05 NOTE — ED Notes (Signed)
Pt ambulated with assistance from this EMT and pts RN. Pt ambulated in hallway with little difficulty. Pt stated it wasn't as bad once she got up and moving.

## 2013-07-05 NOTE — ED Provider Notes (Signed)
CSN: [id number]     Arrival date & time 07/05/13  1551 History   First MD Initiated Contact with Patient 07/05/13 1650     Chief Complaint  Patient presents with  . Fall    HPI Pt was seen at 1705. Per pt, c/o sudden onset and resolution of one episode of fall that occurred PTA. Pt states she was reaching into the trunk of her car to put ice cream in an ice chest when "next thing I knew I fell." States she landed on her right side, mostly onto her right hip. Pt was unable to stand/weightbear on her RLE due to pain. Pt c/o right hip/groin pain since the fall. Denies prodromal symptoms before fall, no syncope, no head injury/LOC, no AMS, no CP/palpitations, no SOB/cough, no abd pain, no N/V/D, no focal motor weakness, no tingling/numbness in extremities, no back or neck pain.    Past Medical History  Diagnosis Date  . ANXIETY 10/13/2007  . BACK PAIN 03/01/2007  . BREAST CANCER, HX OF 12/06/2007  . CONSTIPATION 01/06/2009  . DIVERTICULOSIS, COLON 05/11/2005  . External hemorrhoids without mention of complication 05/11/2005  . FATIGUE 08/07/2008  . HEMORRHOIDS, INTERNAL 05/11/2005  . Hypercalcemia 04/23/2009  . HYPERLIPIDEMIA NEC/NOS 03/01/2007  . HYPERTENSION 10/13/2007  . Impaired fasting glucose 11/14/2008  . INTERSTITIAL CYSTITIS 09/06/2007  . Irritable bowel syndrome 01/06/2009  . LIVER MASS 05/15/2009  . OSTEOPOROSIS 05/09/2009  . PARESTHESIA 09/06/2007  . POLYCYTHEMIA 04/23/2009  . STENOSIS, SPINAL, UNSPC REGION 03/01/2007  . Thyroid disease   . Diverticular disease   . Allergic     Anectine  . Complication of anesthesia     family aggergy to annectine  . DEPRESSION 10/13/2007  . Cancer     RIGHT mastectomy with node dissection  . Compression fracture of L4 lumbar vertebra 02/2013   Past Surgical History  Procedure Laterality Date  . Back surgery    . Cholecystectomy    . Abdominal hysterectomy    . Breast surgery      mastectomy -right  . Cervical spine surgery    . Fixation  kyphoplasty lumbar spine    . Parathyroid exploration  06/29/2011    Procedure: PARATHYROID EXPLORATION;  Surgeon: Velora Heckler, MD;  Location: [facility];  Service: General;  Laterality: Left;  Left Superior Parathyroidectomy  . Parathyroidectomy  06/29/11  . Cortisone injection  10/16/12    left knee   Family History  Problem Relation Age of Onset  . Heart disease Sister   . Hypertension Sister   . Asthma Sister   . Cancer Maternal Aunt     breast  . Cancer Paternal Aunt     ovarian   History  Substance Use Topics  . Smoking status: Former Smoker -- 1.00 packs/day for 42 years    Types: Cigarettes    Quit date: 08/23/1977  . Smokeless tobacco: Never Used  . Alcohol Use: No    Review of Systems ROS: Statement: All systems negative except as marked or noted in the HPI; Constitutional: Negative for fever and chills. ; ; Eyes: Negative for eye pain, redness and discharge. ; ; ENMT: Negative for ear pain, hoarseness, nasal congestion, sinus pressure and sore throat. ; ; Cardiovascular: Negative for chest pain, palpitations, diaphoresis, dyspnea and peripheral edema. ; ; Respiratory: Negative for cough, wheezing and stridor. ; ; Gastrointestinal: Negative for nausea, vomiting, diarrhea, abdominal pain, blood in stool, hematemesis, jaundice and rectal bleeding. . ; ; Genitourinary: Negative for dysuria, flank  pain and hematuria. ; ; Musculoskeletal: +right hip/groin pain. Negative for back pain and neck pain. Negative for swelling and deformity.; ; Skin: Negative for pruritus, rash, abrasions, blisters, bruising and skin lesion.; ; Neuro: Negative for headache, lightheadedness and neck stiffness. Negative for weakness, altered level of consciousness , altered mental status, extremity weakness, paresthesias, involuntary movement, seizure and syncope.      Allergies  Anectine; Decongest; Dilaudid; Hydrocodone-acetaminophen; Iodine; Naproxen sodium; Other; Oxycodone hcl; Pentosan polysulfate  sodium; Pseudoephedrine; Succinylcholine; Tape; Tramadol hcl; Vioxx; and Shellfish allergy  Home Medications   Current Outpatient Rx  Name  Route  Sig  Dispense  Refill  . acetaminophen (TYLENOL) 500 MG tablet   Oral   Take 500-1,000 mg by mouth every 6 (six) hours as needed for pain.         . Aloe Vera (ALOE CAPE) MISC   Oral   Take 1 tablet by mouth daily.          . Attapulgite (KAOPECTATE PO)   Oral   Take 5 mLs by mouth daily as needed (for diarrhea).          . calcium carbonate (OS-CAL) 600 MG TABS   Oral   Take 600 mg by mouth daily.         . carisoprodol (SOMA) 250 MG tablet   Oral   Take 250 mg by mouth daily as needed (Pt is also on the 350 mg Soma.). For muscle spasms.         . carisoprodol (SOMA) 350 MG tablet   Oral   Take 350 mg by mouth at bedtime as needed for muscle spasms (Pt is also taking 250 mg Soma.).          Marland Kitchen celecoxib (CELEBREX) 200 MG capsule   Oral   Take 200 mg by mouth daily.         . cholecalciferol (VITAMIN D) 1000 UNITS tablet   Oral   Take 5,000 Units by mouth daily.          . diazepam (VALIUM) 5 MG tablet   Oral   Take 5 mg by mouth every 8 (eight) hours as needed for anxiety. BRAND NAMES ONLY. For anxiety.         . EDARBI 80 MG TABS   Oral   Take 80 mg by mouth daily as needed (Blood pressure spikes). For blood pressure spikes.         Marland Kitchen estradiol (ESTRACE) 0.1 MG/GM vaginal cream   Vaginal   Place 2 g vaginally daily as needed (for dryness).          Marland Kitchen estrogen, conjugated,-medroxyprogesterone (PREMPRO) 0.3-1.5 MG per tablet   Oral   Take 1 tablet by mouth every evening.         . Homeopathic Products (CALENDULA) CREA   Apply externally   Apply 1 application topically as needed (For feet.). Applies to feet.         . Methenamine-Sodium Salicylate (CYSTEX PO)   Oral   Take 1 tablet by mouth daily as needed (interstitial cystitis).          Marland Kitchen OVER THE COUNTER MEDICATION   Oral   Take  1 tablet by mouth every other day. Perfect Prenatal Multivitamin. She alternates with taking a Multivitamin tablet.          Marland Kitchen OVER THE COUNTER MEDICATION   Oral   Take 1 capsule by mouth daily as needed (stomach upset.). GI Flora. This contains  Lactobacillus acidophilus, L. rhamnosus, L. casei, and Bifidobacterium longum.         Marland Kitchen oxyCODONE-acetaminophen (PERCOCET) 5-325 MG per tablet   Oral   Take 1-2 tablets by mouth every 4 (four) hours as needed for pain. Severe pain         . VISTARIL 25 MG capsule   Oral   Take 25 mg by mouth at bedtime.         . Zoledronic Acid (RECLAST IV)   Intravenous   Inject into the vein. Once a year.          BP 167/62  Pulse 102  Temp(Src) 98.9 F (37.2 C) (Oral)  Resp 20  SpO2 92% Physical Exam 1710: Physical examination:  Nursing notes reviewed; Vital signs and O2 SAT reviewed;  Constitutional: Well developed, Well nourished, Well hydrated, In no acute distress; Head:  Normocephalic, atraumatic. No scalp open wounds, lacerations, abrasions, or ecchymosis.; Eyes: EOMI, PERRL, No scleral icterus; ENMT: Mouth and pharynx normal, Mucous membranes moist; Neck: Supple, Full range of motion, No lymphadenopathy; Cardiovascular: Regular rate and rhythm, No gallop; Respiratory: Breath sounds clear & equal bilaterally, No wheezes.  Speaking full sentences with ease, Normal respiratory effort/excursion; Chest: Nontender, No deformity, no soft tissue crepitus. Movement normal; Abdomen: Soft, Nontender, Nondistended, Normal bowel sounds; Genitourinary: No CVA tenderness; Spine:  No midline CS, TS, LS tenderness.;; Extremities: Pulses normal, NT right shoulder/elbow/wrist/hand. No RUE deformity, edema, ecchymosis, erythema or open wounds. +right groin/pelvic tenderness to palp. Pelvis stable. No obvious deformity, no open wounds, no ecchymosis. NT right knee/ankle/foot. NMS intact RLE. No edema, No calf edema or asymmetry.; Neuro: AA&Ox3, Major CN grossly  intact.  Speech clear. No gross focal motor or sensory deficits in extremities.; Skin: Color normal, Warm, Dry.   ED Course  Procedures  EKG Interpretation     Ventricular Rate:  98 PR Interval:  134 QRS Duration: 138 QT Interval:  384 QTC Calculation: 490 R Axis:   102 Text Interpretation:  Normal sinus rhythm with sinus arrhythmia Right bundle branch block T wave abnormality, consider inferior ischemia No significant change since last tracing dated 06/24/2011.            MDM  MDM Reviewed: previous chart, nursing note and vitals Interpretation: x-ray      Dg Chest 2 View 07/05/2013   CLINICAL DATA:  Fall.  Generalized pain.  EXAM: CHEST  2 VIEW  COMPARISON:  02/21/2013  FINDINGS: Right axillary lymph node dissection has been performed. The heart size and mediastinal contours are within normal limits. Both lungs are clear. Compression deformities involving the T12 and L2 vertebral bodies are again noted. These have been treated with bone cement. There may be mild loss of vertebral body height at the T7 level.  IMPRESSION: 1. No acute cardiopulmonary abnormalities. 2. Thoracic and lumbar compression fractures.   Electronically Signed   By: Signa Kell M.D.   On: 07/05/2013 18:15   Dg Lumbar Spine Complete 07/05/2013   CLINICAL DATA:  Status post fall.  History of lumbar fracture.  EXAM: LUMBAR SPINE - COMPLETE 4+ VIEW  COMPARISON:  CT abdomen and pelvis 02/21/2013.  FINDINGS: Since the prior CT scan, the patient has undergone L3 vertebral augmentation. Some of the cement is extruded within the L3-4 disc interspace, unchanged. Prior T12 and L2 vertebral augmentation for fracture again seen. There is no new fracture. Alignment is unremarkable. Intervertebral disc space height is maintained. Paraspinous structures demonstrate cholecystectomy clips  IMPRESSION: No acute finding.  Remote T12, L2 on L3 fractures with postoperative change of vertebral augmentation noted.    Electronically Signed   By: Drusilla Kanner M.D.   On: 07/05/2013 18:26   Dg Shoulder Right 07/05/2013   CLINICAL DATA:  Fall.  Right shoulder pain.  EXAM: RIGHT SHOULDER - 2+ VIEW  COMPARISON:  None.  FINDINGS: No acute bony or joint abnormality is identified. Mild acromioclavicular degenerative change is seen. Image right lung and ribs are clear. Multiple clips the right axilla are identified. No focal bony lesion.  IMPRESSION: No acute or focal abnormality.   Electronically Signed   By: Drusilla Kanner M.D.   On: 07/05/2013 18:27   Dg Wrist Complete Right 07/05/2013   CLINICAL DATA:  Pain.  Recent fall.  EXAM: RIGHT WRIST - COMPLETE 3+ VIEW  COMPARISON:  10/23/2006  FINDINGS: There is no evidence of fracture or dislocation. There is no evidence of arthropathy or other focal bone abnormality. Soft tissues are unremarkable.  IMPRESSION: Negative.   Electronically Signed   By: Signa Kell M.D.   On: 07/05/2013 18:18   Dg Hip Complete Right 07/05/2013   CLINICAL DATA:  Fall today.  Right hip pain  EXAM: RIGHT HIP - COMPLETE 2+ VIEW  COMPARISON:  CT and radiographs, 02/19/2013.  FINDINGS: There is cortical regularity along the right inferior pubic ramus which is new from the prior exams consistent with an acute nondisplaced fracture.  No other evidence of a fracture.  The right hip joint is normally space and aligned as are the SI joints and symphysis pubis. The bones are demineralized. No bone lesion.  Bowel anastomosis staples lie along the right aspect of the pelvis, stable.  IMPRESSION: Nondisplaced fracture of the right inferior pubic ramus.   Electronically Signed   By: Amie Portland M.D.   On: 07/05/2013 18:16     2100:  Pt received percocet. No walker available in the ED, so pt ambulated around the ED with 2 person assist. Pt ambulated with steady gait, easy resps. States she "feels much better now" and wants to go home. States she has 2 different walkers at home she can use. Dx and testing  d/w pt and family.  Questions answered.  Verb understanding, agreeable to d/c home with outpt f/u.    Laray Anger, DO 07/08/13 1356

## 2013-07-16 ENCOUNTER — Emergency Department (HOSPITAL_COMMUNITY)
Admission: EM | Admit: 2013-07-16 | Discharge: 2013-07-16 | Disposition: A | Discharge status: Home / Self Care | Payer: Medicare Other | Attending: Emergency Medicine | Admitting: Emergency Medicine

## 2013-07-16 ENCOUNTER — Encounter (HOSPITAL_COMMUNITY): Payer: Self-pay | Admitting: Emergency Medicine

## 2013-07-16 DIAGNOSIS — Z862 Personal history of diseases of the blood and blood-forming organs and certain disorders involving the immune mechanism: Secondary | ICD-10-CM | POA: Insufficient documentation

## 2013-07-16 DIAGNOSIS — Z87448 Personal history of other diseases of urinary system: Secondary | ICD-10-CM | POA: Insufficient documentation

## 2013-07-16 DIAGNOSIS — Z791 Long term (current) use of non-steroidal anti-inflammatories (NSAID): Secondary | ICD-10-CM | POA: Insufficient documentation

## 2013-07-16 DIAGNOSIS — F411 Generalized anxiety disorder: Secondary | ICD-10-CM | POA: Insufficient documentation

## 2013-07-16 DIAGNOSIS — E785 Hyperlipidemia, unspecified: Secondary | ICD-10-CM | POA: Insufficient documentation

## 2013-07-16 DIAGNOSIS — M810 Age-related osteoporosis without current pathological fracture: Secondary | ICD-10-CM | POA: Insufficient documentation

## 2013-07-16 DIAGNOSIS — R109 Unspecified abdominal pain: Secondary | ICD-10-CM | POA: Insufficient documentation

## 2013-07-16 DIAGNOSIS — Z79899 Other long term (current) drug therapy: Secondary | ICD-10-CM | POA: Insufficient documentation

## 2013-07-16 DIAGNOSIS — R05 Cough: Secondary | ICD-10-CM | POA: Insufficient documentation

## 2013-07-16 DIAGNOSIS — R059 Cough, unspecified: Secondary | ICD-10-CM | POA: Insufficient documentation

## 2013-07-16 DIAGNOSIS — F329 Major depressive disorder, single episode, unspecified: Secondary | ICD-10-CM | POA: Insufficient documentation

## 2013-07-16 DIAGNOSIS — G8911 Acute pain due to trauma: Secondary | ICD-10-CM | POA: Insufficient documentation

## 2013-07-16 DIAGNOSIS — F3289 Other specified depressive episodes: Secondary | ICD-10-CM | POA: Insufficient documentation

## 2013-07-16 DIAGNOSIS — M25551 Pain in right hip: Secondary | ICD-10-CM

## 2013-07-16 DIAGNOSIS — M25559 Pain in unspecified hip: Secondary | ICD-10-CM | POA: Insufficient documentation

## 2013-07-16 DIAGNOSIS — S329XXS Fracture of unspecified parts of lumbosacral spine and pelvis, sequela: Secondary | ICD-10-CM

## 2013-07-16 DIAGNOSIS — Z853 Personal history of malignant neoplasm of breast: Secondary | ICD-10-CM | POA: Insufficient documentation

## 2013-07-16 DIAGNOSIS — Z87891 Personal history of nicotine dependence: Secondary | ICD-10-CM | POA: Insufficient documentation

## 2013-07-16 DIAGNOSIS — Z8781 Personal history of (healed) traumatic fracture: Secondary | ICD-10-CM | POA: Insufficient documentation

## 2013-07-16 DIAGNOSIS — Z8719 Personal history of other diseases of the digestive system: Secondary | ICD-10-CM | POA: Insufficient documentation

## 2013-07-16 LAB — URINALYSIS, ROUTINE W REFLEX MICROSCOPIC
Bilirubin Urine: NEGATIVE
Ketones, ur: NEGATIVE mg/dL
Specific Gravity, Urine: 1.011 (ref 1.005–1.030)
pH: 8 (ref 5.0–8.0)

## 2013-07-16 LAB — URINE MICROSCOPIC-ADD ON

## 2013-07-16 MED ORDER — OXYCODONE-ACETAMINOPHEN 5-325 MG PO TABS
2.0000 | ORAL_TABLET | Freq: Once | ORAL | Status: AC
  Administered 2013-07-16: 2 via ORAL
  Filled 2013-07-16: qty 2

## 2013-07-16 NOTE — ED Notes (Signed)
Bed: WA04 Expected date:  Expected time:  Means of arrival:  Comments: Fall 2 wks ago

## 2013-07-16 NOTE — Progress Notes (Signed)
   CARE MANAGEMENT ED NOTE 07/16/2013  Patient:  [name], [name]   Account Number:  [id number]  Date Initiated:  07/16/2013  Documentation initiated by:  Edd Arbour  Subjective/Objective Assessment:   79 yr old female medicare/bcbs pt with fall states she has cystitis spouse primary care giver "he is getting tired of me" Pt [facility] for RN, PT, SW and aide     Subjective/Objective Assessment Detail:   pcp [name] Pt states she was offered pain management provider but refused because "he wanted me to sign a contract and give me what he wanted to have"  Has a person assisting in the home for 5 hours a day but spouse states he needs more "help" Pt initially refused [facility] Aide stated an aide would prevent and interfere in her time with her husband during "coffee time" Changed her mind when husband returned to the room Has a bedside commode, "three wheel" walker and a 4 wheel walker CM assisted pt with getting off the bedpan Pt with difficulty turning in bed from side to side Noted pt sitting up on side of bed when husband returned     Action/Plan:   ED CM spoke with EDP, Resident, Pt and spouse Reviewed CM consult for home health Spoke with Judeth Cornfield of [facility] to confirm pt no longer active for services but [facility] will see pt again Cm left voice message for Lucretia for rolator DME order   Action/Plan Detail:   CM encouraged pt/spouse to express their needs and concerns to [facility] staff about services   Anticipated DC Date:  07/16/2013     Status Recommendation to Physician:   Result of Recommendation:    Other ED Services  Consult Working Plan    DC Planning Services  Other  Outpatient Services - Pt will follow up   [facility] Choice  DURABLE MEDICAL EQUIPMENT  HOME HEALTH   Choice offered to / List presented to:  C-1 Patient  DME arranged  OTHER - SEE COMMENT     DME agency  [facility]   HH arranged  HH-1 RN  HH-2 PT  HH-4  NURSE'S AIDE  HH-6 SOCIAL WORKER      HH agency  [facility]    Status of service:  Completed, signed off  ED Comments:   ED Comments Detail:  CM reviewed in details medicare guidelines, home health [facility]) (length of stay in home, types of [facility] staff available, coverage, primary caregiver, up to 24 hrs before services may be started), Private duty nursing (PDN-coverage, length of stay in the home types of staff available),   CM reviewed availability of HH SW to assist pcp to get pt to snf (if desired disposition) from the community level. CM provided spouse & pt with a list of [place] home health agencies, home instead brochure and PDN resources

## 2013-07-16 NOTE — ED Provider Notes (Signed)
CSN: [id number]     Arrival date & time 07/16/13  1315 History   First MD Initiated Contact with Patient 07/16/13 1326     Chief Complaint  Patient presents with  . Leg Pain   (Consider location/radiation/quality/duration/timing/severity/associated sxs/prior Treatment) Patient is a 80 y.o. female presenting with hip pain. The history is provided by the patient and the spouse.  Hip Pain This is a new problem. Episode onset: 10 days ago. The problem occurs constantly. The problem has been gradually worsening. Associated symptoms include abdominal pain, arthralgias and coughing. Pertinent negatives include no anorexia, chest pain, chills, congestion, diaphoresis, fatigue, fever, headaches, joint swelling, myalgias, nausea, neck pain, numbness, rash, sore throat, swollen glands or vomiting. Associated symptoms comments: Suprapubic pain. The symptoms are aggravated by standing, stress, walking and exertion. [name] has tried oral narcotics and rest for the symptoms. The treatment provided mild relief.    Patient presents 11 days after pervious visit to ED for fall c/b non-displaced pelvic fracture. [name] states that the pain has continued to get worse and has not responded to Vicodin. [name] started taking percocet earlier today which helped with her pain.  However, [name] is frustrated that [name] is not pain free and frustrated that [name] is unable to ambulate 2/2 pain.  The pain is located in the anterior pubis and radiates to her right hip. [name] denies constipation, diarrhea, nausea, vomiting, [name] also denies difficulty urinating.    Past Medical History  Diagnosis Date  . ANXIETY 10/13/2007  . BACK PAIN 03/01/2007  . BREAST CANCER, HX OF 12/06/2007  . CONSTIPATION 01/06/2009  . DIVERTICULOSIS, COLON 05/11/2005  . External hemorrhoids without mention of complication 05/11/2005  . FATIGUE 08/07/2008  . HEMORRHOIDS, INTERNAL 05/11/2005  . Hypercalcemia 04/23/2009  . HYPERLIPIDEMIA NEC/NOS 03/01/2007  . HYPERTENSION  10/13/2007  . Impaired fasting glucose 11/14/2008  . INTERSTITIAL CYSTITIS 09/06/2007  . Irritable bowel syndrome 01/06/2009  . LIVER MASS 05/15/2009  . OSTEOPOROSIS 05/09/2009  . PARESTHESIA 09/06/2007  . POLYCYTHEMIA 04/23/2009  . STENOSIS, SPINAL, UNSPC REGION 03/01/2007  . Thyroid disease   . Diverticular disease   . Allergic     Anectine  . Complication of anesthesia     family aggergy to annectine  . DEPRESSION 10/13/2007  . Cancer     RIGHT mastectomy with node dissection  . Compression fracture of L4 lumbar vertebra 02/2013   Past Surgical History  Procedure Laterality Date  . Back surgery    . Cholecystectomy    . Abdominal hysterectomy    . Breast surgery      mastectomy -right  . Cervical spine surgery    . Fixation kyphoplasty lumbar spine    . Parathyroid exploration  06/29/2011    Procedure: PARATHYROID EXPLORATION;  Surgeon: Velora Heckler, MD;  Location: [facility];  Service: General;  Laterality: Left;  Left Superior Parathyroidectomy  . Parathyroidectomy  06/29/11  . Cortisone injection  10/16/12    left knee   Family History  Problem Relation Age of Onset  . Heart disease Sister   . Hypertension Sister   . Asthma Sister   . Cancer Maternal Aunt     breast  . Cancer Paternal Aunt     ovarian   History  Substance Use Topics  . Smoking status: Former Smoker -- 1.00 packs/day for 42 years    Types: Cigarettes    Quit date: 08/23/1977  . Smokeless tobacco: Never Used  . Alcohol Use: No   OB History  Grav Para Term Preterm Abortions TAB SAB Ect Mult Living                 Review of Systems  Constitutional: Negative for fever, chills, diaphoresis and fatigue.  HENT: Negative for congestion and sore throat.   Respiratory: Positive for cough. Negative for apnea, shortness of breath and wheezing.   Cardiovascular: Negative for chest pain, palpitations and leg swelling.  Gastrointestinal: Positive for abdominal pain. Negative for nausea, vomiting, diarrhea,  constipation, abdominal distention and anorexia.  Genitourinary: Negative for dysuria, frequency, flank pain and difficulty urinating.  Musculoskeletal: Positive for arthralgias. Negative for joint swelling, myalgias and neck pain.  Skin: Negative for rash.  Neurological: Negative for numbness and headaches.    Allergies  Anectine; Decongest; Dilaudid; Hydrocodone-acetaminophen; Iodine; Naproxen sodium; Other; Oxycodone hcl; Pentosan polysulfate sodium; Pseudoephedrine; Succinylcholine; Tape; Tramadol hcl; Vioxx; and Shellfish allergy  Home Medications   Current Outpatient Rx  Name  Route  Sig  Dispense  Refill  . acetaminophen (TYLENOL) 500 MG tablet   Oral   Take 500-1,000 mg by mouth every 6 (six) hours as needed for pain.         . Aloe Vera (ALOE CAPE) MISC   Oral   Take 1 tablet by mouth daily.          . Attapulgite (KAOPECTATE PO)   Oral   Take 5 mLs by mouth daily as needed (for diarrhea).          . calcium carbonate (OS-CAL) 600 MG TABS   Oral   Take 600 mg by mouth daily.         . carisoprodol (SOMA) 250 MG tablet   Oral   Take 250 mg by mouth daily as needed (Pt is also on the 350 mg Soma.). For muscle spasms.         . carisoprodol (SOMA) 350 MG tablet   Oral   Take 350 mg by mouth at bedtime as needed for muscle spasms (Pt is also taking 250 mg Soma.).          Marland Kitchen celecoxib (CELEBREX) 200 MG capsule   Oral   Take 200 mg by mouth daily.         . cholecalciferol (VITAMIN D) 1000 UNITS tablet   Oral   Take 5,000 Units by mouth daily.          . diazepam (VALIUM) 5 MG tablet   Oral   Take 5 mg by mouth every 8 (eight) hours as needed for anxiety. BRAND NAMES ONLY. For anxiety.         . EDARBI 80 MG TABS   Oral   Take 80 mg by mouth daily as needed (Blood pressure spikes). For blood pressure spikes.         Marland Kitchen estradiol (ESTRACE) 0.1 MG/GM vaginal cream   Vaginal   Place 2 g vaginally daily as needed (for dryness).          Marland Kitchen  estrogen, conjugated,-medroxyprogesterone (PREMPRO) 0.3-1.5 MG per tablet   Oral   Take 1 tablet by mouth every evening.         . Homeopathic Products (CALENDULA) CREA   Apply externally   Apply 1 application topically as needed (For feet.). Applies to feet.         . Methenamine-Sodium Salicylate (CYSTEX PO)   Oral   Take 1 tablet by mouth daily as needed (interstitial cystitis).          Marland Kitchen  OVER THE COUNTER MEDICATION   Oral   Take 1 tablet by mouth every other day. Perfect Prenatal Multivitamin. [name] alternates with taking a Multivitamin tablet.         Marland Kitchen OVER THE COUNTER MEDICATION   Oral   Take 1 capsule by mouth daily as needed (stomach upset.). GI Flora. This contains Lactobacillus acidophilus, L. rhamnosus, L. casei, and Bifidobacterium longum.         Marland Kitchen oxyCODONE-acetaminophen (PERCOCET) 5-325 MG per tablet   Oral   Take 1-2 tablets by mouth every 4 (four) hours as needed for pain. Severe pain         . oxyCODONE-acetaminophen (PERCOCET/ROXICET) 5-325 MG per tablet      1 or 2 tabs PO q6h prn pain   20 tablet   0   . VISTARIL 25 MG capsule   Oral   Take 25 mg by mouth at bedtime.         . Zoledronic Acid (RECLAST IV)   Intravenous   Inject into the vein. Once a year.          BP 190/94  Pulse 99  Temp(Src) 98.6 F (37 C) (Oral)  Resp 16  SpO2 90% Physical Exam  Constitutional: [name] appears well-developed and well-nourished. No distress.  HENT:  Head: Normocephalic.  Mouth/Throat: Oropharynx is clear and moist. No oropharyngeal exudate.  Eyes: Conjunctivae and EOM are normal. Pupils are equal, round, and reactive to light.  Cardiovascular: Normal rate, regular rhythm, normal heart sounds and intact distal pulses.  Exam reveals no gallop and no friction rub.   No murmur heard. Pulmonary/Chest: Effort normal and breath sounds normal. No respiratory distress. [name] has no wheezes. [name] has no rales.  Abdominal: Bowel sounds are normal. [name]  exhibits no distension and no mass. There is tenderness. There is no rebound and no guarding.  Mild suprapubic tenderness  Skin: [name] is not diaphoretic.    ED Course  Procedures (including critical care time) Labs Review Labs Reviewed  URINALYSIS, ROUTINE W REFLEX MICROSCOPIC - Abnormal; Notable for the following:    APPearance CLOUDY (*)    Hgb urine dipstick TRACE (*)    Leukocytes, UA SMALL (*)    All other components within normal limits  URINE MICROSCOPIC-ADD ON   Imaging Review No results found.  EKG Interpretation   None       MDM   1. Pelvic fracture, sequela     1. Pelvic Pain  The patient appears to have pain 2/2 to a healing pelvic fracture. [name] recently increased her dose from 5/325 percocet to 10/325 mg percocet which is doing a good job with her pain. However, [name] is unable to ambulate and take care of herself. Further, her husband who is 68 yo is unable to care for her. I have consulted the ED SW for evaluation for inpatient rehab versus home health and home nursing.I expect the patient to be discharged home with home health and f/u with Dr. Lajoyce Corners (ortho). Urine cultures and pending and will be followed up as indicated. Patient already has percocet at home. DME ordered and home health PT RN and SW ordered. The plan was discussed with the patient and they agree.   Pleas Koch, MD 07/16/13 1532  Pleas Koch, MD 07/16/13 1533  Pleas Koch, MD 07/16/13 1540

## 2013-07-16 NOTE — ED Notes (Signed)
Removed patient from bed pan. Urine collected and held.

## 2013-07-16 NOTE — ED Notes (Signed)
Patient from home fell two weeks ago and diagnosed with a right pelvic fracture.  Patient was told to take medication and it was okay to walk on leg but pain has increased to the point that she cannot walk.  Patient has been taking Percocet for pain.  Last dose 11AM.  Rates pain between an 8 and a 9.

## 2013-07-16 NOTE — ED Provider Notes (Signed)
I saw and evaluated the patient, reviewed the resident's note and I agree with the findings and plan.  EKG Interpretation   None       Well appearing. Improved pain with percocet. Case management involved, home health resources will be provided. D/c home with orthopedic follow up  Lyanne Co, MD 07/16/13 [phone]

## 2013-07-17 ENCOUNTER — Telehealth (HOSPITAL_COMMUNITY): Payer: Self-pay

## 2013-07-17 ENCOUNTER — Telehealth: Payer: Self-pay | Admitting: Internal Medicine

## 2013-07-17 NOTE — ED Notes (Signed)
Call from pt regarding CM referral to Home Health Care.  Informed [facility] was notified and the person they spoke to was [place].

## 2013-07-17 NOTE — Telephone Encounter (Signed)
Pt has been broken pelvic and was seen at [facility] and needs home health care. Please fax order to [facility] [phone]

## 2013-07-17 NOTE — Telephone Encounter (Signed)
Left message on machine i have talked with mel[facility] and they already have a referral for pt from ed and they are to start today

## 2013-08-28 ENCOUNTER — Encounter: Payer: Self-pay | Admitting: Internal Medicine

## 2013-08-28 DIAGNOSIS — IMO0001 Reserved for inherently not codable concepts without codable children: Secondary | ICD-10-CM

## 2013-08-28 DIAGNOSIS — M25559 Pain in unspecified hip: Secondary | ICD-10-CM

## 2013-08-28 DIAGNOSIS — M48061 Spinal stenosis, lumbar region without neurogenic claudication: Secondary | ICD-10-CM

## 2013-08-28 DIAGNOSIS — M810 Age-related osteoporosis without current pathological fracture: Secondary | ICD-10-CM

## 2013-09-25 ENCOUNTER — Encounter: Payer: Self-pay | Admitting: Internal Medicine

## 2013-09-25 ENCOUNTER — Ambulatory Visit (INDEPENDENT_AMBULATORY_CARE_PROVIDER_SITE_OTHER): Payer: Medicare Other | Admitting: Internal Medicine

## 2013-09-25 VITALS — BP 120/78 | HR 66 | Ht 64.0 in | Wt 163.4 lb

## 2013-09-25 DIAGNOSIS — K589 Irritable bowel syndrome without diarrhea: Secondary | ICD-10-CM | POA: Insufficient documentation

## 2013-09-25 DIAGNOSIS — K648 Other hemorrhoids: Secondary | ICD-10-CM

## 2013-09-25 DIAGNOSIS — Z860100 Personal history of colon polyps, unspecified: Secondary | ICD-10-CM | POA: Insufficient documentation

## 2013-09-25 MED ORDER — NA SULFATE-K SULFATE-MG SULF 17.5-3.13-1.6 GM/177ML PO SOLN: ORAL | Status: DC

## 2013-09-25 NOTE — Assessment & Plan Note (Signed)
Take probiotic everyday. Reassess symptoms at colonoscopy.

## 2013-09-25 NOTE — Patient Instructions (Addendum)
You have been scheduled for a colonoscopy with propofol. Please follow written instructions given to you at your visit today.  Please pick up your prep kit at the pharmacy within the next 1-3 days. If you use inhalers (even only as needed), please bring them with you on the day of your procedure.   Take a probiotic every day for a month.   HEMORRHOID BANDING PROCEDURE    FOLLOW-UP CARE   1. The procedure you have had should have been relatively painless since the banding of the area involved does not have nerve endings and there is no pain sensation.  The rubber band cuts off the blood supply to the hemorrhoid and the band may fall off as soon as 48 hours after the banding (the band may occasionally be seen in the toilet bowl following a bowel movement). You may notice a temporary feeling of fullness in the rectum which should respond adequately to plain Tylenol or Motrin.  2. Following the banding, avoid strenuous exercise that evening and resume full activity the next day.  A sitz bath (soaking in a warm tub) or bidet is soothing, and can be useful for cleansing the area after bowel movements.     3. To avoid constipation, take two tablespoons of natural wheat bran, natural oat bran, flax, Benefiber or any over the counter fiber supplement and increase your water intake to 7-8 glasses daily.    4. Unless you have been prescribed anorectal medication, do not put anything inside your rectum for two weeks: No suppositories, enemas, fingers, etc.  5. Occasionally, you may have more bleeding than usual after the banding procedure.  This is often from the untreated hemorrhoids rather than the treated one.  Don't be concerned if there is a tablespoon or so of blood.  If there is more blood than this, lie flat with your bottom higher than your head and apply an ice pack to the area. If the bleeding does not stop within a half an hour or if you feel faint, call our office at [phone] or go  to the emergency room.  6. Problems are not common; however, if there is a substantial amount of bleeding, severe pain, chills, fever or difficulty passing urine (very rare) or other problems, you should call us at [phone] or report to the nearest emergency room.  7. Do not stay seated continuously for more than 2-3 hours for a day or two after the procedure.  Tighten your buttock muscles 10-15 times every two hours and take 10-15 deep breaths every 1-2 hours.  Do not spend more than a few minutes on the toilet if you cannot empty your bowel; instead re-visit the toilet at a later time.    I appreciate the opportunity to care for you.   Per Dr. Carlean Purl ok to do prep the day of procedure.  Separate the doses by at least 3 hours.

## 2013-09-25 NOTE — Assessment & Plan Note (Signed)
Adenomas 5 and about 8 years ago. She is 84 but healthy overall and has one Jevity in her family with both her parents living to their 52s. We have decided to pursue one more routine colonoscopy.The risks and benefits as well as alternatives of endoscopic procedure(s) have been discussed and reviewed. All questions answered. The patient agrees to proceed.

## 2013-09-25 NOTE — Progress Notes (Signed)
          Subjective:    Patient ID: [name], female    DOB: 1930-01-26, 80 y.o.   MRN: [id number]  HPI Patient is a delightful elderly woman known to me from prior colonoscopy. She has a history of adenomatous polyps and had one about 5 years ago. She is not quite due for a routine repeat colonoscopy in, that is in March, but she is here because she says her intestines or sore. He has increasing gas and IBS problems. She is also complaining of protruding hemorrhoids that prolapse spontaneous reduction usually, occasional bleeding and occasional soreness particularly when sitting for a long period of time because of a protrusion. She has had hemorrhoid banding in the past by Dr. Rosana Hoes of surgery. She's taking her probiotic but not daily. She feels like he is getting better, her abdomen is not as sore as it was an it was worse when she had a pelvic fracture in the fall. She said she was for a sore all over them. She does have interstitial cystitis and is having some trouble with that as well. Medications, allergies, past medical history, past surgical history, family history and social history are reviewed and updated in the EMR.  Review of Systems As above. Chronic neuropathic m the pain is problematic as well.    Objective:   Physical Exam General:  NAD Eyes:   anicteric Lungs:  clear Heart:  S1S2 no rubs, murmurs or gallops Abdomen:  soft and nontender, BS+  Rectal: Female staff present -  There are some external tags/prolapse changes that are manually reducible.   Anoscopy was performed with the patient in the left lateral decubitus position while a chaperone was present and revealed internal hemorrhoids RP>PA>LL.  PROCEDURE NOTE: The patient presents with symptomatic grade 1-2  hemorrhoids, requesting rubber band ligation of his/her hemorrhoidal disease.  All risks, benefits and alternative forms of therapy were described and informed consent was obtained.   The decision  was made to band the RP internal hemorrhoid, and the [place] was used to perform band ligation without complication.  Digital anorectal examination was then performed to assure proper positioning of the band, and to adjust the banded tissue as required. Protruding tag retracted with banding of this right posterior hemorrhoid.  The patient was discharged home without pain or other issues.  Dietary and behavioral recommendations were given and along with follow-up instructions.     The patient will return in about one month for routine colonoscopy and will assess to see ifpossible additional banding is required after that. No complications were encountered and the patient tolerated the procedure well.      Assessment & Plan:   1. Personal history of colonic polyps - adenomas   2. Internal hemorrhoids with other complication   3. IBS (irritable bowel syndrome)

## 2013-09-25 NOTE — Assessment & Plan Note (Signed)
Right posterior internal hemorrhoid columns banded today

## 2013-09-28 ENCOUNTER — Telehealth (HOSPITAL_COMMUNITY): Payer: Self-pay | Admitting: Hematology and Oncology

## 2013-10-01 ENCOUNTER — Telehealth: Payer: Self-pay | Admitting: Internal Medicine

## 2013-10-01 NOTE — Telephone Encounter (Signed)
That is probably a good idea-ok with dr Verdis Prime change banner

## 2013-10-01 NOTE — Telephone Encounter (Signed)
Pt would like to switch to dr kim due to dr jenkins limited schedule °

## 2013-10-02 ENCOUNTER — Ambulatory Visit (INDEPENDENT_AMBULATORY_CARE_PROVIDER_SITE_OTHER): Payer: Medicare Other | Admitting: Family Medicine

## 2013-10-02 ENCOUNTER — Encounter: Payer: Self-pay | Admitting: Family Medicine

## 2013-10-02 VITALS — BP 134/90 | Temp 99.0°F | Ht 64.0 in | Wt 161.0 lb

## 2013-10-02 DIAGNOSIS — R300 Dysuria: Secondary | ICD-10-CM

## 2013-10-02 LAB — POCT URINALYSIS DIPSTICK
Bilirubin, UA: NEGATIVE
GLUCOSE UA: NEGATIVE
KETONES UA: NEGATIVE
Nitrite, UA: NEGATIVE
PH UA: 7.5
Protein, UA: NEGATIVE
RBC UA: NEGATIVE
SPEC GRAV UA: 1.015
Urobilinogen, UA: 0.2

## 2013-10-02 NOTE — Addendum Note (Signed)
Addended by: Townsend Roger D on: 10/02/2013 11:38 AM   Modules accepted: Orders

## 2013-10-02 NOTE — Progress Notes (Signed)
Chief Complaint  Patient presents with  . Back Pain    left    HPI:  [name] is a pt of Dr. Arnoldo Morale here for an acute visit for:  Dysuria/Low back pain: -has chronic pack pain followed by Dr. Read Drivers per her report, but wanted to make sure not UTI -low back pain chronically, a little different yesterday in terms of location of pain, moderate, achy, which she reports had in the past with UTI -feels better today after BM and treatment with heat, tylenol and soma  - here to check urine to make sure not UTI -denies: fevers, vomiting, diarrhea - change in radiation of pain, bowel or bladder incontinence  ROS: See pertinent positives and negatives per HPI.  Past Medical History  Diagnosis Date  . ANXIETY 10/13/2007  . BACK PAIN 03/01/2007  . BREAST CANCER, HX OF 12/06/2007  . CONSTIPATION 01/06/2009  . DIVERTICULOSIS, COLON 05/11/2005  . External hemorrhoids without mention of complication 10/02/4707  . FATIGUE 08/07/2008  . HEMORRHOIDS, INTERNAL 05/11/2005  . Hypercalcemia 04/23/2009  . HYPERLIPIDEMIA NEC/NOS 03/01/2007  . HYPERTENSION 10/13/2007  . Impaired fasting glucose 11/14/2008  . INTERSTITIAL CYSTITIS 09/06/2007  . Irritable bowel syndrome 01/06/2009  . LIVER MASS 05/15/2009  . OSTEOPOROSIS 05/09/2009  . PARESTHESIA 09/06/2007  . POLYCYTHEMIA 04/23/2009  . STENOSIS, SPINAL, UNSPC REGION 03/01/2007  . Thyroid disease   . Diverticular disease   . Allergic     Anectine  . Complication of anesthesia     family aggergy to annectine  . DEPRESSION 10/13/2007  . Cancer     RIGHT mastectomy with node dissection  . Compression fracture of L4 lumbar vertebra 02/2013  . Adenomatous colon polyp 04/2005    Past Surgical History  Procedure Laterality Date  . Back surgery    . Cholecystectomy    . Abdominal hysterectomy    . Breast surgery      mastectomy -right  . Cervical spine surgery    . Fixation kyphoplasty lumbar spine    . Parathyroid exploration  06/29/2011   Procedure: PARATHYROID EXPLORATION;  Surgeon: Earnstine Regal, MD;  Location: [facility];  Service: General;  Laterality: Left;  Left Superior Parathyroidectomy  . Parathyroidectomy  06/29/11  . Cortisone injection  10/16/12    left knee  . Colonoscopy w/ biopsies      Family History  Problem Relation Age of Onset  . Heart disease Sister   . Hypertension Sister   . Asthma Sister   . Breast cancer Maternal Aunt   . Ovarian cancer Paternal Aunt     History   Social History  . Marital Status: Married    Spouse Name: N/A    Number of Children: N/A  . Years of Education: N/A   Social History Main Topics  . Smoking status: Former Smoker -- 1.00 packs/day for 42 years    Types: Cigarettes    Quit date: 08/23/1977  . Smokeless tobacco: Never Used  . Alcohol Use: No  . Drug Use: No  . Sexual Activity: None   Other Topics Concern  . None   Social History Narrative  . None    Current outpatient prescriptions:acetaminophen (TYLENOL) 500 MG tablet, Take 500-1,000 mg by mouth every 6 (six) hours as needed for pain., Disp: , Rfl: ;  Attapulgite (KAOPECTATE PO), Take 5 mLs by mouth daily as needed (for diarrhea). , Disp: , Rfl: ;  calcium carbonate (OS-CAL) 600 MG TABS, Take 600 mg by mouth daily., Disp: ,  Rfl:  carisoprodol (SOMA) 250 MG tablet, Take 250 mg by mouth daily as needed (Pt is also on the 350 mg Soma.). For muscle spasms., Disp: , Rfl: ;  carisoprodol (SOMA) 350 MG tablet, Take 350 mg by mouth at bedtime as needed for muscle spasms (Pt is also taking 250 mg Soma.). , Disp: , Rfl: ;  celecoxib (CELEBREX) 200 MG capsule, Take 200 mg by mouth daily., Disp: , Rfl:  cholecalciferol (VITAMIN D) 1000 UNITS tablet, Take 5,000 Units by mouth daily. , Disp: , Rfl: ;  diazepam (VALIUM) 5 MG tablet, Take 5 mg by mouth every 8 (eight) hours as needed for anxiety. BRAND NAMES ONLY. For anxiety., Disp: , Rfl: ;  EDARBI 80 MG TABS, Take 80 mg by mouth daily as needed (Blood pressure spikes). For blood  pressure spikes., Disp: , Rfl:  estradiol (ESTRACE) 0.1 MG/GM vaginal cream, Place 2 g vaginally daily as needed (for dryness). , Disp: , Rfl: ;  estrogen, conjugated,-medroxyprogesterone (PREMPRO) 0.3-1.5 MG per tablet, Take 1 tablet by mouth every evening., Disp: , Rfl: ;  Homeopathic Products (CALENDULA) CREA, Apply 1 application topically as needed (For feet burning.). Applies to feet., Disp: , Rfl:  Methenamine-Sodium Salicylate (CYSTEX PO), Take 1 tablet by mouth daily as needed (interstitial cystitis). , Disp: , Rfl: ;  Na Sulfate-K Sulfate-Mg Sulf (SUPREP BOWEL PREP) SOLN, Use as directed, Disp: 2 Bottle, Rfl: 0;  OVER THE COUNTER MEDICATION, Take 1 tablet by mouth every other day. Perfect Prenatal Multivitamin. She alternates with taking a Multivitamin tablet., Disp: , Rfl:  OVER THE COUNTER MEDICATION, Take 1 capsule by mouth daily as needed (stomach upset.). GI Flora. This contains Lactobacillus acidophilus, L. rhamnosus, L. casei, and Bifidobacterium longum., Disp: , Rfl: ;  oxyCODONE-acetaminophen (PERCOCET) 5-325 MG per tablet, Take 1-2 tablets by mouth every 4 (four) hours as needed for pain. Severe pain, Disp: , Rfl:  Polyethyl Glycol-Propyl Glycol (SYSTANE OP), Apply 1 drop to eye every 6 (six) hours as needed., Disp: , Rfl: ;  VISTARIL 25 MG capsule, Take 25 mg by mouth at bedtime., Disp: , Rfl: ;  Zoledronic Acid (RECLAST IV), Inject into the vein. Once a year., Disp: , Rfl:   EXAM:  Filed Vitals:   10/02/13 1114  BP: 134/90  Temp: 99 F (37.2 C)    Body mass index is 27.62 kg/(m^2).  GENERAL: vitals reviewed and listed above, alert, oriented, appears well hydrated and in no acute distress  HEENT: atraumatic, conjunttiva clear, no obvious abnormalities on inspection of external nose and ears  NECK: no obvious masses on inspection  ABD: BS normal, soft, NTTP, no CVA TTP  MS: moves all extremities without noticeable abnormality, normal gait, TTP in R gluteus maximus - no  bony TTP  PSYCH: pleasant and cooperative, no obvious depression or anxiety  ASSESSMENT AND PLAN:  Discussed the following assessment and plan:  Dysuria - Plan: POCT urinalysis dipstick  -urine dip: neg -culture pending -for muscle soreness advised heat, stretching, tylenol as this is resolving -Patient advised to return or notify a doctor immediately if symptoms worsen or persist or new concerns arise.  There are no Patient Instructions on file for this visit.   Colin Benton R.

## 2013-10-02 NOTE — Progress Notes (Signed)
Pre visit review using our clinic review tool, if applicable. No additional management support is needed unless otherwise documented below in the visit note. 

## 2013-10-02 NOTE — Telephone Encounter (Signed)
Patient Information:  Caller Name: Caleb  Phone: [phone]  Patient: [name], [name]  Gender: Female  DOB: [birth year]  Age: 80 Years  PCP: Benay Pillow (Adults only)  Office Follow Up:  Does the office need to follow up with this patient?: No  Instructions For The Office: N/A  RN Note:  Afebrile. Onset of MODERATE left sided pain, 10/01/2013. Tylenol and Muscle relaxor, Soma, did not help and neither did valium, which she takes to control her CNS and BP. Heating pad did not help either. This pain is new and the last time pain like this it was a kidney related pain. Recently she thought she had a bladder infection. Pain after bowel movement lessened. Home Care advice given and she thinks she needs to give urine speciman. She agreed to appointment today, 10/02/2013 @ 11:00 with Dr. Maudie Mercury.  Symptoms  Reason For Call & Symptoms: left lower side has pain  Reviewed Health History In EMR: Yes  Reviewed Medications In EMR: Yes  Reviewed Allergies In EMR: Yes  Reviewed Surgeries / Procedures: Yes  Date of Onset of Symptoms: 10/01/2013  Guideline(s) Used:  Back Pain  Disposition Per Guideline:   See Today in Office  Reason For Disposition Reached:   Can't walk or can barely walk  Advice Given:  Reassurance:  Twisting or heavy lifting can cause back pain.  With treatment, the pain most often goes away in 1-2 weeks.  You can treat most back pain at home.  Sleep:  Sleep on your side with a pillow between your knees. If you sleep on your back, put a pillow under your knees.  Avoid sleeping on your stomach.  Your mattress should be firm. Avoid waterbeds.  Activity  You do not need to stay in bed.  Patient Will Follow Care Advice:  YES  Appointment Scheduled:  10/02/2013 11:00:00 Appointment Scheduled Provider:  Maudie Mercury (TEXT 1st, after 20 mins can call), Jarrett Soho [facility])

## 2013-10-03 LAB — URINE CULTURE
Colony Count: NO GROWTH
Organism ID, Bacteria: NO GROWTH

## 2013-10-17 ENCOUNTER — Ambulatory Visit (HOSPITAL_COMMUNITY): Payer: Medicare Other

## 2013-10-24 ENCOUNTER — Ambulatory Visit (AMBULATORY_SURGERY_CENTER): Payer: Medicare Other | Admitting: Internal Medicine

## 2013-10-24 ENCOUNTER — Encounter: Payer: Self-pay | Admitting: Internal Medicine

## 2013-10-24 VITALS — BP 182/90 | HR 92 | Temp 99.3°F | Resp 24 | Ht 64.0 in | Wt 161.0 lb

## 2013-10-24 DIAGNOSIS — D126 Benign neoplasm of colon, unspecified: Secondary | ICD-10-CM

## 2013-10-24 DIAGNOSIS — K5730 Diverticulosis of large intestine without perforation or abscess without bleeding: Secondary | ICD-10-CM

## 2013-10-24 DIAGNOSIS — Z86010 Personal history of colonic polyps: Secondary | ICD-10-CM

## 2013-10-24 DIAGNOSIS — K648 Other hemorrhoids: Secondary | ICD-10-CM

## 2013-10-24 MED ORDER — SODIUM CHLORIDE 0.9 % IV SOLN: 500.0000 mL | INTRAVENOUS | Status: DC

## 2013-10-24 NOTE — Progress Notes (Signed)
Called to room to assist during endoscopic procedure.  Patient ID and intended procedure confirmed with present staff. Received instructions for my participation in the procedure from the performing physician.  

## 2013-10-24 NOTE — Progress Notes (Signed)
  [facility] Post-op Note  Patient: [name]  Procedure(s) Performed: colonoscopy  Patient Location: [facility] - Recovery Area  Anesthesia Type: Deep Sedation/Propofol  Level of Consciousness: awake, oriented and patient cooperative  Airway and Oxygen Therapy: Patient Spontanous Breathing  Post-op Pain: none  Post-op Assessment:  Post-op Vital signs reviewed, Patient's Cardiovascular Status Stable, Respiratory Function Stable, Patent Airway, No signs of Nausea or vomiting and Pain level controlled  Post-op Vital Signs: Reviewed and stable  Complications: No apparent anesthesia complications  Annet Manukyan E 4:50 PM

## 2013-10-24 NOTE — Op Note (Signed)
[address] [place], [id number]   COLONOSCOPY PROCEDURE REPORT  PATIENT: Selena, [name]  MR#: [id number] BIRTHDATE: 1929-11-15 , 83  yrs. old GENDER: Female ENDOSCOPIST: Gatha Mayer, MD, [facility] PROCEDURE DATE:  10/24/2013 PROCEDURE:   Colonoscopy with biopsy First Screening Colonoscopy - Avg.  risk and is 50 yrs.  old or older - No.  Prior Negative Screening - Now for repeat screening. N/A  History of Adenoma - Now for follow-up colonoscopy & has been > or = to 3 yrs.  Yes hx of adenoma.  Has been 3 or more years since last colonoscopy.  Polyps Removed Today? Yes. ASA CLASS:   Class II INDICATIONS:Patient's personal history of adenomatous colon polyps.  MEDICATIONS: propofol (Diprivan) 200mg  IV, MAC sedation, administered by CRNA, and These medications were titrated to patient response per physician's verbal order  DESCRIPTION OF PROCEDURE:   After the risks benefits and alternatives of the procedure were thoroughly explained, informed consent was obtained.  A digital rectal exam revealed no rectal mass and A digital rectal exam revealed several skin tags.   The endoscope was introduced through the anus and advanced to the cecum, which was identified by both the appendix and ileocecal valve. No adverse events experienced.   The quality of the prep was Suprep adequate  The instrument was then slowly withdrawn as the colon was fully examined.  COLON FINDINGS: Two diminutive sessile polyps were found at the cecum and in the descending colon.  A polypectomy was performed with cold forceps.  The resection was complete and the polyp tissue was completely retrieved.   Severe diverticulosis was noted in the sigmoid colon.   A small ulcer was found in rectum (right posterior) seen upon the retroflexed view.   The colon mucosa was otherwise normal.  Retroflexed views also  revealed internal hemorrhoids - right anterior most prominent. The time to  cecum=4 minutes 45 seconds.  Withdrawal time=10 minutes 15 seconds.  The scope was withdrawn and the procedure completed. COMPLICATIONS: There were no complications.  ENDOSCOPIC IMPRESSION: 1.   Two diminutive sessile polyps were found at the cecum and in the descending colon; polypectomy was performed with cold forceps 2.   Severe diverticulosis was noted in the sigmoid colon 3.   Small ulcer in rectum seen upon the retroflexed view - from recent RP hemorrhoid banding 4.   The colon mucosa was otherwise normal  RECOMMENDATIONS: 1.  Await pathology results - do not anticipate routine repeat colonoscopy (age) 2.   She may call to schedule further hemorrhoid banding if desired - looks like right anterior most prominent   eSigned:  Gatha Mayer, MD, [facility] 10/24/2013 5:08 PM cc: The Patient

## 2013-10-24 NOTE — Progress Notes (Signed)
DR Carlean Purl SPOKE W/ PT ABOUT TAKING HER BP MED WHEN SHE ETS HOME AND TO F/U W/ PCP REGARDING ELEVATED BP. DR Carlean Purl OK'D PT FOR D/C

## 2013-10-24 NOTE — Progress Notes (Signed)
The pt and Dr. Carlean Purl agreed to take the suprep all in one day.  The pt drank ist at 0700.  And took the 2nd dose at 10:00 today.  She states the last result was clear yellow liquid.  Selena Eddy, RN notified him of this.  Dr. Carlean Purl said that they did agree to this schedule of taking the suprep. Maw

## 2013-10-24 NOTE — Patient Instructions (Addendum)
I removed two tiny polyps. I will let you know the results by mail. You still have diverticulosis. There is an ulcer where the hemorrhoid banding is healing, and you have some other hemorrhoids.  You may call to make an appointment for repeat hemorrhoid banding if desired.  I appreciate the opportunity to care for you. Gatha Mayer, MD, FACG   YOU HAD AN ENDOSCOPIC PROCEDURE TODAY AT [facility]: Refer to the procedure report that was given to you for any specific questions about what was found during the examination.  If the procedure report does not answer your questions, please call your gastroenterologist to clarify.  If you requested that your care partner not be given the details of your procedure findings, then the procedure report has been included in a sealed envelope for you to review at your convenience later.  YOU SHOULD EXPECT: Some feelings of bloating in the abdomen. Passage of more gas than usual.  Walking can help get rid of the air that was put into your GI tract during the procedure and reduce the bloating. If you had a lower endoscopy (such as a colonoscopy or flexible sigmoidoscopy) you may notice spotting of blood in your stool or on the toilet paper. If you underwent a bowel prep for your procedure, then you may not have a normal bowel movement for a few days.  DIET: Your first meal following the procedure should be a light meal and then it is ok to progress to your normal diet.  A half-sandwich or bowl of soup is an example of a good first meal.  Heavy or fried foods are harder to digest and may make you feel nauseous or bloated.  Likewise meals heavy in dairy and vegetables can cause extra gas to form and this can also increase the bloating.  Drink plenty of fluids but you should avoid alcoholic beverages for 24 hours.  ACTIVITY: Your care partner should take you home directly after the procedure.  You should plan to take it easy, moving slowly for the  rest of the day.  You can resume normal activity the day after the procedure however you should NOT DRIVE or use heavy machinery for 24 hours (because of the sedation medicines used during the test).    SYMPTOMS TO REPORT IMMEDIATELY: A gastroenterologist can be reached at any hour.  During normal business hours, 8:30 AM to 5:00 PM Monday through Friday, call [phone].  After hours and on weekends, please call the GI answering service at [phone] who will take a message and have the physician on call contact you.   Following lower endoscopy (colonoscopy or flexible sigmoidoscopy):  Excessive amounts of blood in the stool  Significant tenderness or worsening of abdominal pains  Swelling of the abdomen that is new, acute  Fever of 100F or higher    FOLLOW UP: If any biopsies were taken you will be contacted by phone or by letter within the next 1-3 weeks.  Call your gastroenterologist if you have not heard about the biopsies in 3 weeks.  Our staff will call the home number listed on your records the next business day following your procedure to check on you and address any questions or concerns that you may have at that time regarding the information given to you following your procedure. This is a courtesy call and so if there is no answer at the home number and we have not heard from you through the emergency physician  on call, we will assume that you have returned to your regular daily activities without incident.  SIGNATURES/CONFIDENTIALITY: You and/or your care partner have signed paperwork which will be entered into your electronic medical record.  These signatures attest to the fact that that the information above on your After Visit Summary has been reviewed and is understood.  Full responsibility of the confidentiality of this discharge information lies with you and/or your care-partner.   INFORMATION ON POLYPS & DIVERTICULOSIS  GIVEN TO YOU TODAY

## 2013-10-31 ENCOUNTER — Encounter: Payer: Self-pay | Admitting: Internal Medicine

## 2013-10-31 NOTE — Progress Notes (Signed)
Quick Note:  One diminutive adenoma and benign mucosal colon polyp No recall due to age ______

## 2013-11-20 ENCOUNTER — Other Ambulatory Visit (HOSPITAL_COMMUNITY): Payer: Self-pay | Admitting: Oncology

## 2013-11-20 DIAGNOSIS — Z139 Encounter for screening, unspecified: Secondary | ICD-10-CM

## 2014-01-09 ENCOUNTER — Other Ambulatory Visit: Payer: Self-pay | Admitting: Internal Medicine

## 2014-06-07 ENCOUNTER — Other Ambulatory Visit: Payer: Self-pay

## 2014-07-16 ENCOUNTER — Ambulatory Visit: Payer: Medicare Other | Admitting: Family Medicine

## 2014-08-02 ENCOUNTER — Other Ambulatory Visit (INDEPENDENT_AMBULATORY_CARE_PROVIDER_SITE_OTHER): Payer: Medicare Other

## 2014-08-02 ENCOUNTER — Encounter: Payer: Self-pay | Admitting: Internal Medicine

## 2014-08-02 ENCOUNTER — Ambulatory Visit (INDEPENDENT_AMBULATORY_CARE_PROVIDER_SITE_OTHER): Payer: Medicare Other | Admitting: Internal Medicine

## 2014-08-02 VITALS — BP 152/84 | HR 99 | Temp 98.2°F | Resp 20 | Ht 64.0 in | Wt 177.0 lb

## 2014-08-02 DIAGNOSIS — E785 Hyperlipidemia, unspecified: Secondary | ICD-10-CM

## 2014-08-02 DIAGNOSIS — I10 Essential (primary) hypertension: Secondary | ICD-10-CM

## 2014-08-02 DIAGNOSIS — R739 Hyperglycemia, unspecified: Secondary | ICD-10-CM

## 2014-08-02 DIAGNOSIS — M810 Age-related osteoporosis without current pathological fracture: Secondary | ICD-10-CM

## 2014-08-02 DIAGNOSIS — R7301 Impaired fasting glucose: Secondary | ICD-10-CM

## 2014-08-02 DIAGNOSIS — Z0000 Encounter for general adult medical examination without abnormal findings: Secondary | ICD-10-CM

## 2014-08-02 DIAGNOSIS — Z853 Personal history of malignant neoplasm of breast: Secondary | ICD-10-CM

## 2014-08-02 LAB — BASIC METABOLIC PANEL
BUN: 18 mg/dL (ref 6–23)
CO2: 28 meq/L (ref 19–32)
Calcium: 9.5 mg/dL (ref 8.4–10.5)
Chloride: 104 mEq/L (ref 96–112)
Creatinine, Ser: 0.9 mg/dL (ref 0.4–1.2)
GFR: 62.06 mL/min (ref >60.00)
Glucose, Bld: 169 mg/dL — ABNORMAL HIGH (ref 70–99)
Potassium: 4.6 mEq/L (ref 3.5–5.1)
SODIUM: 139 meq/L (ref 135–145)

## 2014-08-02 LAB — LIPID PANEL
CHOL/HDL RATIO: 7
Cholesterol: 241 mg/dL — ABNORMAL HIGH (ref 0–200)
HDL: 32.7 mg/dL — AB (ref >39.00)
NONHDL: 208.3
Triglycerides: 432 mg/dL — ABNORMAL HIGH (ref 0.0–149.0)
VLDL: 86.4 mg/dL — ABNORMAL HIGH (ref 0.0–40.0)

## 2014-08-02 LAB — HEMOGLOBIN A1C: Hgb A1c MFr Bld: 6.8 % — ABNORMAL HIGH (ref 4.6–6.5)

## 2014-08-02 LAB — LDL CHOLESTEROL, DIRECT: Direct LDL: 154.7 mg/dL

## 2014-08-02 NOTE — Patient Instructions (Signed)
We will check your blood work today. We will call you back with those results.  We will see you next year for your physical. If you have any problems or questions before then please feel free to call us back.  Fall Prevention and Home Safety Falls cause injuries and can affect all age groups. It is possible to use preventive measures to significantly decrease the likelihood of falls. There are many simple measures which can make your home safer and prevent falls. OUTDOORS  Repair cracks and edges of walkways and driveways.  Remove high doorway thresholds.  Trim shrubbery on the main path into your home.  Have good outside lighting.  Clear walkways of tools, rocks, debris, and clutter.  Check that handrails are not broken and are securely fastened. Both sides of steps should have handrails.  Have leaves, snow, and ice cleared regularly.  Use sand or salt on walkways during winter months.  In the garage, clean up grease or oil spills. BATHROOM  Install night lights.  Install grab bars by the toilet and in the tub and shower.  Use non-skid mats or decals in the tub or shower.  Place a plastic non-slip stool in the shower to sit on, if needed.  Keep floors dry and clean up all water on the floor immediately.  Remove soap buildup in the tub or shower on a regular basis.  Secure bath mats with non-slip, double-sided rug tape.  Remove throw rugs and tripping hazards from the floors. BEDROOMS  Install night lights.  Make sure a bedside light is easy to reach.  Do not use oversized bedding.  Keep a telephone by your bedside.  Have a firm chair with side arms to use for getting dressed.  Remove throw rugs and tripping hazards from the floor. KITCHEN  Keep handles on pots and pans turned toward the center of the stove. Use back burners when possible.  Clean up spills quickly and allow time for drying.  Avoid walking on wet floors.  Avoid hot utensils and  knives.  Position shelves so they are not too high or low.  Place commonly used objects within easy reach.  If necessary, use a sturdy step stool with a grab bar when reaching.  Keep electrical cables out of the way.  Do not use floor polish or wax that makes floors slippery. If you must use wax, use non-skid floor wax.  Remove throw rugs and tripping hazards from the floor. STAIRWAYS  Never leave objects on stairs.  Place handrails on both sides of stairways and use them. Fix any loose handrails. Make sure handrails on both sides of the stairways are as long as the stairs.  Check carpeting to make sure it is firmly attached along stairs. Make repairs to worn or loose carpet promptly.  Avoid placing throw rugs at the top or bottom of stairways, or properly secure the rug with carpet tape to prevent slippage. Get rid of throw rugs, if possible.  Have an electrician put in a light switch at the top and bottom of the stairs. OTHER FALL PREVENTION TIPS  Wear low-heel or rubber-soled shoes that are supportive and fit well. Wear closed toe shoes.  When using a stepladder, make sure it is fully opened and both spreaders are firmly locked. Do not climb a closed stepladder.  Add color or contrast paint or tape to grab bars and handrails in your home. Place contrasting color strips on first and last steps.  Learn and use mobility  aids as needed. Install an electrical emergency response system.  Turn on lights to avoid dark areas. Replace light bulbs that burn out immediately. Get light switches that glow.  Arrange furniture to create clear pathways. Keep furniture in the same place.  Firmly attach carpet with non-skid or double-sided tape.  Eliminate uneven floor surfaces.  Select a carpet pattern that does not visually hide the edge of steps.  Be aware of all pets. OTHER HOME SAFETY TIPS  Set the water temperature for 120 F (48.8 C).  Keep emergency numbers on or near the  telephone.  Keep smoke detectors on every level of the home and near sleeping areas. Document Released: 07/30/2002 Document Revised: 02/08/2012 Document Reviewed: 10/29/2011 [facility] Patient Information 2015 [place], [place]. This information is not intended to replace advice given to you by your health care provider. Make sure you discuss any questions you have with your health care provider.

## 2014-08-02 NOTE — Progress Notes (Signed)
Pre visit review using our clinic review tool, if applicable. No additional management support is needed unless otherwise documented below in the visit note. 

## 2014-08-05 NOTE — Assessment & Plan Note (Signed)
She does continue to follow with heme/onc yearly, we'll need to review records to see what your her breast cancer was. She could not recall during our visit.

## 2014-08-05 NOTE — Assessment & Plan Note (Signed)
Currently taking reclast yearly. We'll need to review records to see what year this was initiated.

## 2014-08-05 NOTE — Assessment & Plan Note (Signed)
She does have borderline elevated blood pressure. Would not recommend her current when necessary blood pressure agent. Feel the beta blocker may be more beneficial given that she feels like she needs it when her mood is also up. If BP continually mildly elevated at next visit may start a blood pressure agent all the time rather than when necessary. Have discouraged her from taking blood pressure agent when necessary.

## 2014-08-05 NOTE — Assessment & Plan Note (Signed)
Check hemoglobin A1c.

## 2014-08-05 NOTE — Progress Notes (Signed)
   Subjective:    Patient ID: [name], female    DOB: September 29, 1929, 80 y.o.   MRN: [id number]  HPI The patient is an 80 year old female comes in today to establish care. She has a past medical history of back pain, remote breast cancer, hyperlipidemia, IBS, osteoporosis. She does follow with the pain clinic and does chronically take Percocet, Soma, Celebrex for pain. She feels as though she is doing well overall but when her mood gets irritated she feels her blood pressure goes up. She does have a blood pressure medicine that she can take as needed for blood pressure. She states she doesn't take it very often. Denies any chest pains, shortness of breath. She does exercise several times a week.  Review of Systems  Constitutional: Negative for fever, activity change, appetite change, fatigue and unexpected weight change.  HENT: Negative.   Respiratory: Negative for cough, chest tightness, shortness of breath and wheezing.   Cardiovascular: Negative for chest pain, palpitations and leg swelling.  Gastrointestinal: Negative for nausea, abdominal pain, diarrhea, constipation and abdominal distention.  Musculoskeletal: Positive for myalgias, back pain and arthralgias. Negative for gait problem.  Skin: Negative.   Neurological: Negative.       Objective:   Physical Exam  Constitutional: She is oriented to person, place, and time. She appears well-developed and well-nourished.  HENT:  Head: Normocephalic and atraumatic.  Eyes: EOM are normal.  Neck: Normal range of motion.  Cardiovascular: Normal rate and regular rhythm.   Pulmonary/Chest: Effort normal and breath sounds normal. No respiratory distress. She has no wheezes.  Abdominal: Soft. Bowel sounds are normal. She exhibits no distension. There is no tenderness.  Neurological: She is alert and oriented to person, place, and time. Coordination normal.  Skin: Skin is warm and dry.   Filed Vitals:   08/02/14 1430  BP: 152/84    Pulse: 99  Temp: 98.2 F (36.8 C)  TempSrc: Oral  Resp: 20  Height: 5\' 4"  (1.626 m)  Weight: 177 lb (80.287 kg)  SpO2: 97%      Assessment & Plan:

## 2014-08-28 ENCOUNTER — Encounter: Payer: Self-pay | Admitting: Internal Medicine

## 2014-08-28 ENCOUNTER — Ambulatory Visit (INDEPENDENT_AMBULATORY_CARE_PROVIDER_SITE_OTHER): Payer: Medicare Other | Admitting: Internal Medicine

## 2014-08-28 VITALS — BP 132/90 | HR 120 | Temp 97.9°F | Resp 20 | Ht 64.0 in | Wt 176.0 lb

## 2014-08-28 DIAGNOSIS — F411 Generalized anxiety disorder: Secondary | ICD-10-CM

## 2014-08-28 DIAGNOSIS — M545 Low back pain, unspecified: Secondary | ICD-10-CM

## 2014-08-28 DIAGNOSIS — I10 Essential (primary) hypertension: Secondary | ICD-10-CM

## 2014-08-28 NOTE — Patient Instructions (Signed)
Dr. Estanislado Pandy does not do back surgery but we will send you to a good back surgeon so that you can talk to them about your pain. If you are not having chronic pain I am not sure that you would get good benefit from a back surgery.  We will see you back as scheduled.

## 2014-08-28 NOTE — Progress Notes (Signed)
Pre visit review using our clinic review tool, if applicable. No additional management support is needed unless otherwise documented below in the visit note. 

## 2014-08-29 NOTE — Assessment & Plan Note (Signed)
Blood pressure is normal today. I am not sure that I agree with her valium usage and I have asked her to try not to use it and she informed me that this was not possible. Will not personally prescribe for her at that interval.

## 2014-08-29 NOTE — Assessment & Plan Note (Signed)
She claims that she does not have anxiety but that she takes valium for blood pressure.
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
## 2014-09-06 ENCOUNTER — Ambulatory Visit (INDEPENDENT_AMBULATORY_CARE_PROVIDER_SITE_OTHER): Payer: Medicare Other | Admitting: Internal Medicine

## 2014-09-06 ENCOUNTER — Telehealth: Payer: Self-pay | Admitting: Internal Medicine

## 2014-09-06 ENCOUNTER — Encounter: Payer: Self-pay | Admitting: Internal Medicine

## 2014-09-06 ENCOUNTER — Other Ambulatory Visit: Payer: Self-pay | Admitting: Geriatric Medicine

## 2014-09-06 VITALS — BP 142/70 | HR 120 | Temp 99.0°F | Resp 22 | Wt 171.2 lb

## 2014-09-06 DIAGNOSIS — J069 Acute upper respiratory infection, unspecified: Secondary | ICD-10-CM

## 2014-09-06 MED ORDER — FLUTICASONE PROPIONATE 50 MCG/ACT NA SUSP: 2.0000 | Freq: Every day | NASAL | Status: DC

## 2014-09-06 MED ORDER — FLUTICASONE PROPIONATE 50 MCG/ACT NA SUSP
2.0000 | Freq: Every day | NASAL | Status: DC
Start: 2014-09-06 — End: 2020-12-16

## 2014-09-06 NOTE — Progress Notes (Signed)
Pre visit review using our clinic review tool, if applicable. No additional management support is needed unless otherwise documented below in the visit note. 

## 2014-09-06 NOTE — Telephone Encounter (Signed)
Spoke to pt regard rx being sent in to [facility]). Rx was erx to mail order but has since been resent to [facility]

## 2014-09-06 NOTE — Assessment & Plan Note (Signed)
Reassured that small amount of blood normal after nosebleed. If continues will get chest x-ray. No indication for antibiotics. Rx for flonase to help with congestion.

## 2014-09-06 NOTE — Telephone Encounter (Signed)
Inquiring on script for an antibiotic.

## 2014-09-06 NOTE — Patient Instructions (Signed)
We have sent in flonase for you. Use 2 puffs in each nostril every day for the next 1-2 weeks. You can continue to use saline nose spray as well to rinse out the sinuses.   It may take 2-3 weeks for the cough to go away entirely once you are feeling better. If you are still coughing up blood in 1-2 weeks please call our office back.  Upper Respiratory Infection, Adult An upper respiratory infection (URI) is also sometimes known as the common cold. The upper respiratory tract includes the nose, sinuses, throat, trachea, and bronchi. Bronchi are the airways leading to the lungs. Most people improve within 1 week, but symptoms can last up to 2 weeks. A residual cough may last even longer.  CAUSES Many different viruses can infect the tissues lining the upper respiratory tract. The tissues become irritated and inflamed and often become very moist. Mucus production is also common. A cold is contagious. You can easily spread the virus to others by oral contact. This includes kissing, sharing a glass, coughing, or sneezing. Touching your mouth or nose and then touching a surface, which is then touched by another person, can also spread the virus. SYMPTOMS  Symptoms typically develop 1 to 3 days after you come in contact with a cold virus. Symptoms vary from person to person. They may include:  Runny nose.  Sneezing.  Nasal congestion.  Sinus irritation.  Sore throat.  Loss of voice (laryngitis).  Cough.  Fatigue.  Muscle aches.  Loss of appetite.  Headache.  Low-grade fever. DIAGNOSIS  You might diagnose your own cold based on familiar symptoms, since most people get a cold 2 to 3 times a year. Your caregiver can confirm this based on your exam. Most importantly, your caregiver can check that your symptoms are not due to another disease such as strep throat, sinusitis, pneumonia, asthma, or epiglottitis. Blood tests, throat tests, and X-rays are not necessary to diagnose a common cold,  but they may sometimes be helpful in excluding other more serious diseases. Your caregiver will decide if any further tests are required. RISKS AND COMPLICATIONS  You may be at risk for a more severe case of the common cold if you smoke cigarettes, have chronic heart disease (such as heart failure) or lung disease (such as asthma), or if you have a weakened immune system. The very young and very old are also at risk for more serious infections. Bacterial sinusitis, middle ear infections, and bacterial pneumonia can complicate the common cold. The common cold can worsen asthma and chronic obstructive pulmonary disease (COPD). Sometimes, these complications can require emergency medical care and may be life-threatening. PREVENTION  The best way to protect against getting a cold is to practice good hygiene. Avoid oral or hand contact with people with cold symptoms. Wash your hands often if contact occurs. There is no clear evidence that vitamin C, vitamin E, echinacea, or exercise reduces the chance of developing a cold. However, it is always recommended to get plenty of rest and practice good nutrition. TREATMENT  Treatment is directed at relieving symptoms. There is no cure. Antibiotics are not effective, because the infection is caused by a virus, not by bacteria. Treatment may include:  Increased fluid intake. Sports drinks offer valuable electrolytes, sugars, and fluids.  Breathing heated mist or steam (vaporizer or shower).  Eating chicken soup or other clear broths, and maintaining good nutrition.  Getting plenty of rest.  Using gargles or lozenges for comfort.  Controlling fevers  with ibuprofen or acetaminophen as directed by your caregiver.  Increasing usage of your inhaler if you have asthma. Zinc gel and zinc lozenges, taken in the first 24 hours of the common cold, can shorten the duration and lessen the severity of symptoms. Pain medicines may help with fever, muscle aches, and throat  pain. A variety of non-prescription medicines are available to treat congestion and runny nose. Your caregiver can make recommendations and may suggest nasal or lung inhalers for other symptoms.  HOME CARE INSTRUCTIONS   Only take over-the-counter or prescription medicines for pain, discomfort, or fever as directed by your caregiver.  Use a warm mist humidifier or inhale steam from a shower to increase air moisture. This may keep secretions moist and make it easier to breathe.  Drink enough water and fluids to keep your urine clear or pale yellow.  Rest as needed.  Return to work when your temperature has returned to normal or as your caregiver advises. You may need to stay home longer to avoid infecting others. You can also use a face mask and careful hand washing to prevent spread of the virus. SEEK MEDICAL CARE IF:   After the first few days, you feel you are getting worse rather than better.  You need your caregiver's advice about medicines to control symptoms.  You develop chills, worsening shortness of breath, or brown or red sputum. These may be signs of pneumonia.  You develop yellow or brown nasal discharge or pain in the face, especially when you bend forward. These may be signs of sinusitis.  You develop a fever, swollen neck glands, pain with swallowing, or white areas in the back of your throat. These may be signs of strep throat. SEEK IMMEDIATE MEDICAL CARE IF:   You have a fever.  You develop severe or persistent headache, ear pain, sinus pain, or chest pain.  You develop wheezing, a prolonged cough, cough up blood, or have a change in your usual mucus (if you have chronic lung disease).  You develop sore muscles or a stiff neck. Document Released: 02/02/2001 Document Revised: 11/01/2011 Document Reviewed: 11/14/2013 [facility] Patient Information 2015 [place], [place]. This information is not intended to replace advice given to you by your health care provider. Make sure  you discuss any questions you have with your health care provider.

## 2014-09-06 NOTE — Progress Notes (Signed)
   Subjective:    Patient ID: [name], female    DOB: March 15, 1930, 81 y.o.   MRN: [id number]  HPI The patient is an 81 YO female who is coming in today for cough. It started about 1 week ago right after her husband came down with a cold. She thinks that the cough is getting better but she is still coughing some. She coughed up a fleck of blood and wanted to make sure it was okay. She had a small amount of crusted blood in her nose the other morning when she awoke. She has since gotten the humidifier into her room at night time. She is also using saline nose spray for her nose. She denies fevers or chills. She denies ear pain, drainage, sinus pain or tenderness.   Review of Systems  Constitutional: Negative for fever, chills, activity change, appetite change, fatigue and unexpected weight change.  HENT: Positive for congestion, nosebleeds, postnasal drip and rhinorrhea. Negative for dental problem, drooling, ear discharge, ear pain, facial swelling, sinus pressure, sore throat and voice change.   Respiratory: Positive for cough. Negative for chest tightness, shortness of breath and wheezing.   Cardiovascular: Negative for chest pain, palpitations and leg swelling.  Gastrointestinal: Negative.   Neurological: Negative.       Objective:   Physical Exam  Constitutional: She appears well-developed and well-nourished.  HENT:  Head: Normocephalic and atraumatic.  Eyes: EOM are normal.  Neck: Normal range of motion.  Cardiovascular: Normal rate and regular rhythm.   Pulmonary/Chest: Effort normal and breath sounds normal. No respiratory distress. She has no wheezes. She has no rales.  Abdominal: Soft. Bowel sounds are normal. She exhibits no distension. There is no tenderness. There is no rebound.   Filed Vitals:   09/06/14 1451  BP: 142/70  Pulse: 120  Temp: 99 F (37.2 C)  TempSrc: Oral  Resp: 22  Weight: 171 lb 3.2 oz (77.656 kg)  SpO2: 96%      Assessment & Plan:

## 2014-09-10 ENCOUNTER — Telehealth: Payer: Self-pay | Admitting: Internal Medicine

## 2014-09-10 NOTE — Telephone Encounter (Signed)
Rectal bleeding yesterday after a BM.  Blood is bright red.  She would feel better if she were examined to determine if it is in fact hemorrhoidal.  She will come in and see Alonza Bogus, PA tomorrow at 1:30

## 2014-09-11 ENCOUNTER — Encounter: Payer: Self-pay | Admitting: Gastroenterology

## 2014-09-11 ENCOUNTER — Ambulatory Visit (INDEPENDENT_AMBULATORY_CARE_PROVIDER_SITE_OTHER): Payer: Medicare Other | Admitting: Gastroenterology

## 2014-09-11 VITALS — BP 190/96 | HR 92 | Ht 63.75 in | Wt 175.4 lb

## 2014-09-11 DIAGNOSIS — K625 Hemorrhage of anus and rectum: Secondary | ICD-10-CM

## 2014-09-11 DIAGNOSIS — K649 Unspecified hemorrhoids: Secondary | ICD-10-CM | POA: Insufficient documentation

## 2014-09-11 MED ORDER — HYDROCORTISONE ACETATE 25 MG RE SUPP: 25.0000 mg | Freq: Two times a day (BID) | RECTAL | Status: DC

## 2014-09-11 NOTE — Progress Notes (Signed)
     09/11/2014 [name] [id number] November 02, 1929   History of Present Illness:  This is an 81 year old female who is known to Dr. Carlean Purl.  She had a colonoscopy in March 2015 at which time she was found to have two polyps that were removed and one was a tubular adenoma.  Also had severe diverticulosis and hemorrhoids as well as a rectal ulcer from recent hemorrhoid banding.  She presents to our office today with complaints of rectal bleeding on one occasion on Monday.  She says that she had a small BM and then had bright red blood on the toilet paper and some on the stool as well as a "glob" in the toilet.  No bleeding since that time.  No rectal pain or discomfort.  She knows that she has hemorrhoids, but just wanted to get it checked.   Current Medications, Allergies, Past Medical History, Past Surgical History, Family History and Social History were reviewed in [employer] record.   Physical Exam: BP 190/96 mmHg  Pulse 92  Ht 5' 3.75" (1.619 m)  Wt 175 lb 6 oz (79.55 kg)  BMI 30.35 kg/m2 General: Well developed white female in no acute distress Head: Normocephalic and atraumatic Eyes:  Sclerae anicteric, conjunctiva pink  Ears: Normal auditory acuity Rectal:  External hemorrhoids noted. Musculoskeletal: Symmetrical with no gross deformities  Extremities: No edema  Neurological: Alert oriented x 4, grossly non-focal Psychological:  Alert and cooperative. Normal mood and affect  Assessment and Recommendations: -Rectal bleeding:  One episode two days ago, now resolved.  Hemorrhoids evident on exam, one prolapsed internal hemorrhoid that was inflamed and with stigmata of recent bleeding.  Will treat with hydrocortisone suppositories BID for 7-10 days.  Rectal care instructions reviewed and given as well.

## 2014-09-11 NOTE — Patient Instructions (Signed)
We have sent  medications to your pharmacy for you to pick up at your convenience. CC:  Vertell Novak MD

## 2014-09-12 NOTE — Progress Notes (Signed)
Agree with Ms. Alphia Kava management.  Would consider repeat banding - the rectal ulcer she had after banding is part of normal post-banding process.  Gatha Mayer, MD, [facility]

## 2014-11-04 ENCOUNTER — Ambulatory Visit (INDEPENDENT_AMBULATORY_CARE_PROVIDER_SITE_OTHER): Payer: Medicare Other | Admitting: Internal Medicine

## 2014-11-04 ENCOUNTER — Encounter: Payer: Self-pay | Admitting: Internal Medicine

## 2014-11-04 VITALS — BP 146/86 | HR 120 | Ht 63.75 in | Wt 173.2 lb

## 2014-11-04 DIAGNOSIS — E739 Lactose intolerance, unspecified: Secondary | ICD-10-CM | POA: Insufficient documentation

## 2014-11-04 DIAGNOSIS — K648 Other hemorrhoids: Secondary | ICD-10-CM

## 2014-11-04 DIAGNOSIS — K589 Irritable bowel syndrome without diarrhea: Secondary | ICD-10-CM

## 2014-11-04 NOTE — Progress Notes (Signed)
   Subjective:    Patient ID: [name], female    DOB: Mar 09, 1930, 81 y.o.   MRN: [id number] Cc: Rectal bleeding, IBS HPI Elderly woman with IBS and hemorrhoids. Seen in Jan 2016 with rectal bleeding - Anusol HC rx has helped. Still has some intermittent bleeding on TP.  Has loose urgent stools with some urge incontinence at times. X years. Also has some mild constipation. Usese metamucil most days and may use some mint tea from [employer] that causes defecation. "It really works".  She says she has bad diarrhea when she ate ice cream but she was ice cream and does not want to give it up. Other milk products will cause similar problems.  Medications, allergies, past medical history, past surgical history, family history and social history are reviewed and updated in the EMR.  Review of Systems As above    Objective:   Physical Exam BP 146/86 mmHg  Pulse 120  Ht 5' 3.75" (1.619 m)  Wt 173 lb 4 oz (78.586 kg)  BMI 29.98 kg/m2    Rectal: Female staff present - bulging LL and RA internal hemorrhoids with associated tags.    Anoscopy was performed with the patient in the left lateral decubitus position while a chaperone was present and revealed Grade 2 internal hemorrhoids RA and LL   PROCEDURE NOTE: The patient presents with symptomatic grade 2  hemorrhoids, requesting rubber band ligation of his/her hemorrhoidal disease.  All risks, benefits and alternative forms of therapy were described and informed consent was obtained.   The decision was made to band the RA and LL internal hemorrhoids, and the Hardin was used to perform band ligation without complication.  Digital anorectal examination was then performed to assure proper positioning of the band, and to adjust the banded tissue as required.  The patient was discharged home without pain or other issues.  Dietary and behavioral recommendations were given and along with follow-up instructions.     The  following adjunctive treatments were recommended:  Benefiber 1 tbsp daily (replace Metamucil)  The patient will return in 1 month for  follow-up and possible additional banding as required. No complications were encountered and the patient tolerated the procedure well.       Assessment & Plan:    IBS (irritable bowel syndrome) Benefiber 1 tablespoon daily will be tried. Is okay to use Imodium as needed. Work on Lactose intolerance as well.   Lactose intolerance I educated her about this today and recommended modifying diet and or using Lactaid. Handouts provided.   Internal hemorrhoids with bleeding and prolapse She had her right posterior hemorrhoid banded last year but never followed up for that. I banded her right anterior and left lateral internal hemorrhoids that looked symptomatic today. She will follow-up in about a month.

## 2014-11-04 NOTE — Patient Instructions (Addendum)
HEMORRHOID BANDING PROCEDURE    FOLLOW-UP CARE   1. The procedure you have had should have been relatively painless since the banding of the area involved does not have nerve endings and there is no pain sensation.  The rubber band cuts off the blood supply to the hemorrhoid and the band may fall off as soon as 48 hours after the banding (the band may occasionally be seen in the toilet bowl following a bowel movement). You may notice a temporary feeling of fullness in the rectum which should respond adequately to plain Tylenol or Motrin.  2. Following the banding, avoid strenuous exercise that evening and resume full activity the next day.  A sitz bath (soaking in a warm tub) or bidet is soothing, and can be useful for cleansing the area after bowel movements.     3. To avoid constipation, take two tablespoons of natural wheat bran, natural oat bran, flax, Benefiber or any over the counter fiber supplement and increase your water intake to 7-8 glasses daily.    4. Unless you have been prescribed anorectal medication, do not put anything inside your rectum for two weeks: No suppositories, enemas, fingers, etc.  5. Occasionally, you may have more bleeding than usual after the banding procedure.  This is often from the untreated hemorrhoids rather than the treated one.  Don't be concerned if there is a tablespoon or so of blood.  If there is more blood than this, lie flat with your bottom higher than your head and apply an ice pack to the area. If the bleeding does not stop within a half an hour or if you feel faint, call our office at [phone] or go to the emergency room.  6. Problems are not common; however, if there is a substantial amount of bleeding, severe pain, chills, fever or difficulty passing urine (very rare) or other problems, you should call us at [phone] or report to the nearest emergency room.  7. Do not stay seated continuously for more than 2-3 hours for a day or two  after the procedure.  Tighten your buttock muscles 10-15 times every two hours and take 10-15 deep breaths every 1-2 hours.  Do not spend more than a few minutes on the toilet if you cannot empty your bowel; instead re-visit the toilet at a later time.    Today you have been given a handout to read and follow on Benefiber.  Use one tablespoon daily.  We are giving you a booklet on lactose free food information to read.     We will see you back at your return visit:  April 29th at 4:00pm.   I appreciate the opportunity to care for you. Silvano Rusk, M.D., [facility]

## 2014-11-04 NOTE — Assessment & Plan Note (Signed)
Benefiber 1 tablespoon daily will be tried. Is okay to use Imodium as needed. Work on Lactose intolerance as well.

## 2014-11-04 NOTE — Assessment & Plan Note (Signed)
I educated her about this today and recommended modifying diet and or using Lactaid. Handouts provided.

## 2014-11-04 NOTE — Assessment & Plan Note (Signed)
She had her right posterior hemorrhoid banded last year but never followed up for that. I banded her right anterior and left lateral internal hemorrhoids that looked symptomatic today. She will follow-up in about a month.

## 2014-11-07 ENCOUNTER — Telehealth: Payer: Self-pay

## 2014-11-07 NOTE — Telephone Encounter (Signed)
No way to leave message for flu vaccine, inbox is full

## 2014-11-22 ENCOUNTER — Other Ambulatory Visit (HOSPITAL_COMMUNITY): Payer: Self-pay | Admitting: Interventional Radiology

## 2014-11-22 DIAGNOSIS — M549 Dorsalgia, unspecified: Secondary | ICD-10-CM

## 2014-11-22 DIAGNOSIS — IMO0002 Reserved for concepts with insufficient information to code with codable children: Secondary | ICD-10-CM

## 2014-12-02 ENCOUNTER — Ambulatory Visit (HOSPITAL_COMMUNITY)
Admission: RE | Admit: 2014-12-02 | Discharge: 2014-12-02 | Disposition: A | Discharge status: Home / Self Care | Payer: Medicare Other | Source: Ambulatory Visit | Attending: Interventional Radiology | Admitting: Interventional Radiology

## 2014-12-02 DIAGNOSIS — M4854XD Collapsed vertebra, not elsewhere classified, thoracic region, subsequent encounter for fracture with routine healing: Secondary | ICD-10-CM | POA: Diagnosis not present

## 2014-12-02 DIAGNOSIS — M4856XD Collapsed vertebra, not elsewhere classified, lumbar region, subsequent encounter for fracture with routine healing: Secondary | ICD-10-CM | POA: Insufficient documentation

## 2014-12-02 DIAGNOSIS — K5730 Diverticulosis of large intestine without perforation or abscess without bleeding: Secondary | ICD-10-CM | POA: Diagnosis not present

## 2014-12-02 DIAGNOSIS — D1809 Hemangioma of other sites: Secondary | ICD-10-CM | POA: Diagnosis not present

## 2014-12-02 DIAGNOSIS — M549 Dorsalgia, unspecified: Secondary | ICD-10-CM

## 2014-12-02 DIAGNOSIS — IMO0002 Reserved for concepts with insufficient information to code with codable children: Secondary | ICD-10-CM

## 2014-12-02 DIAGNOSIS — I517 Cardiomegaly: Secondary | ICD-10-CM | POA: Insufficient documentation

## 2014-12-02 DIAGNOSIS — M5124 Other intervertebral disc displacement, thoracic region: Secondary | ICD-10-CM | POA: Insufficient documentation

## 2014-12-05 ENCOUNTER — Ambulatory Visit: Payer: Medicare Other | Admitting: Family Medicine

## 2014-12-17 ENCOUNTER — Encounter: Payer: Self-pay | Admitting: Internal Medicine

## 2014-12-17 ENCOUNTER — Ambulatory Visit (INDEPENDENT_AMBULATORY_CARE_PROVIDER_SITE_OTHER): Payer: Medicare Other | Admitting: Internal Medicine

## 2014-12-17 VITALS — BP 158/78 | HR 120 | Temp 98.4°F | Resp 20 | Ht 64.0 in | Wt 174.1 lb

## 2014-12-17 DIAGNOSIS — M545 Low back pain, unspecified: Secondary | ICD-10-CM

## 2014-12-17 MED ORDER — CARISOPRODOL 250 MG PO TABS: 250.0000 mg | ORAL_TABLET | Freq: Every day | ORAL | Status: DC | PRN

## 2014-12-17 MED ORDER — CARISOPRODOL 350 MG PO TABS: 350.0000 mg | ORAL_TABLET | Freq: Every evening | ORAL | Status: DC | PRN

## 2014-12-17 MED ORDER — CELECOXIB 200 MG PO CAPS: 200.0000 mg | ORAL_CAPSULE | Freq: Every day | ORAL | Status: DC

## 2014-12-17 NOTE — Progress Notes (Signed)
Pre visit review using our clinic review tool, if applicable. No additional management support is needed unless otherwise documented below in the visit note. 

## 2014-12-17 NOTE — Patient Instructions (Signed)
We would like you to take the flonase as it is safer than the vistaril. If it is not working call us and let us know.   We have sent in the celebrex and the soma to your pharmacy and you may need to call them to remind them to send the celebrex now.

## 2014-12-19 ENCOUNTER — Telehealth: Payer: Self-pay | Admitting: Internal Medicine

## 2014-12-19 NOTE — Assessment & Plan Note (Signed)
Will refill the soma (2 strengths as she takes the 350 mg if needed at night and the 250 if needed during the day due to drowsiness. She is low risk as it has been filled once in the last year. Will not refill percocet for her.

## 2014-12-19 NOTE — Progress Notes (Signed)
   Subjective:    Patient ID: [name], female    DOB: 04-06-1930, 81 y.o.   MRN: [id number]  HPI The patient is an 81 YO female who is coming in to talk about her chronic pain. She does not like to take medications and goes to a pain clinic where they work with chiropractic work and pain distraction techniques. This works most of the time but she is running low of her soma and needs a refill. She has no idea who the last person was to prescribe it. She had previously told me that she had a pain center (when I ran her through narcotic database to find out she has not filled the soma or had percocet filled except once (soma) in the last year).   Review of Systems  Constitutional: Negative for fever, activity change, appetite change, fatigue and unexpected weight change.  HENT: Negative.   Respiratory: Negative for cough, chest tightness, shortness of breath and wheezing.   Cardiovascular: Negative for chest pain, palpitations and leg swelling.  Gastrointestinal: Negative for nausea, abdominal pain, diarrhea, constipation and abdominal distention.  Musculoskeletal: Positive for myalgias, back pain and arthralgias. Negative for gait problem.  Skin: Negative.   Neurological: Negative.       Objective:   Physical Exam  Constitutional: She appears well-developed and well-nourished.  HENT:  Head: Normocephalic and atraumatic.  Eyes: EOM are normal.  Neck: Normal range of motion.  Cardiovascular: Normal rate and regular rhythm.   Pulmonary/Chest: Effort normal and breath sounds normal. No respiratory distress. She has no wheezes. She has no rales.  Abdominal: Soft. Bowel sounds are normal. She exhibits no distension. There is no tenderness. There is no rebound.   Filed Vitals:   12/17/14 1549  BP: 158/78  Pulse: 120  Temp: 98.4 F (36.9 C)  TempSrc: Oral  Resp: 20  Height: 5\' 4"  (1.626 m)  Weight: 174 lb 1.9 oz (78.98 kg)  SpO2: 93%      Assessment & Plan:

## 2014-12-19 NOTE — Telephone Encounter (Signed)
Pt came by stating she needs a prior authorization done for Celebrex.

## 2014-12-20 ENCOUNTER — Encounter: Payer: Self-pay | Admitting: Internal Medicine

## 2014-12-20 ENCOUNTER — Ambulatory Visit (INDEPENDENT_AMBULATORY_CARE_PROVIDER_SITE_OTHER): Payer: Medicare Other | Admitting: Internal Medicine

## 2014-12-20 VITALS — BP 170/90 | HR 100 | Ht 63.75 in | Wt 173.4 lb

## 2014-12-20 DIAGNOSIS — R030 Elevated blood-pressure reading, without diagnosis of hypertension: Secondary | ICD-10-CM

## 2014-12-20 DIAGNOSIS — K648 Other hemorrhoids: Secondary | ICD-10-CM

## 2014-12-20 DIAGNOSIS — K589 Irritable bowel syndrome without diarrhea: Secondary | ICD-10-CM

## 2014-12-20 DIAGNOSIS — IMO0001 Reserved for inherently not codable concepts without codable children: Secondary | ICD-10-CM

## 2014-12-20 NOTE — Patient Instructions (Signed)
Please take your benefiber on a regular basis.  Continue your probiotic.   Follow up with Dr Carlean Purl as needed.    I appreciate the opportunity to care for you. Silvano Rusk, M.D., [facility]

## 2014-12-20 NOTE — Assessment & Plan Note (Signed)
Improved Needs to take fiber regularly

## 2014-12-22 ENCOUNTER — Encounter: Payer: Self-pay | Admitting: Internal Medicine

## 2014-12-22 DIAGNOSIS — R030 Elevated blood-pressure reading, without diagnosis of hypertension: Secondary | ICD-10-CM

## 2014-12-22 DIAGNOSIS — IMO0001 Reserved for inherently not codable concepts without codable children: Secondary | ICD-10-CM | POA: Insufficient documentation

## 2014-12-22 NOTE — Assessment & Plan Note (Signed)
Has htn Needs f/u PCP

## 2014-12-22 NOTE — Progress Notes (Signed)
   Subjective:    Patient ID: [name], female    DOB: Jan 07, 1930, 81 y.o.   MRN: [id number] Cc: f/u hemorrhoids and fecal soiling HPI The patient returns after hemorrhoid banding. She had RP internal hemorrhoids banded in 2015 then RA and  LL banded 3/14 2016. Her main c/o has been rectal bleeding and symptomatic prolapse of hemorrhoids. She has IBS with alternating bowel habits. She "despises medication use: and thus has not been compliant with fiber supplementation daily as had been recommended (Benefiber).  Is out of her probiotics also. Bowel habits are no stools for 1-2 days vs. Frequent urgent stools.  Medications, allergies, past medical history, past surgical history, family history and social history are reviewed and updated in the EMR.  Review of Systems As above, some anxiety    Objective:   Physical Exam BP 170/90 mmHg  Pulse 100  Ht 5' 3.75" (1.619 m)  Wt 173 lb 6 oz (78.642 kg)  BMI 30.00 kg/m2 Elderly ww NAD Inspection of rectum w/ female staff present reveals anal tags all 3 positions but smaller than before. Some external hemorrhoid component RP noted. No mass on DRE. No tenderness.       Assessment & Plan:  Internal hemorrhoids with bleeding and prolapse Improved Needs to take fiber regularly   Elevated blood pressure Has htn Needs f/u PCP   15 minutes time spent with patient > half in counseling coordination of care

## 2014-12-24 ENCOUNTER — Other Ambulatory Visit: Payer: Self-pay | Admitting: Geriatric Medicine

## 2014-12-24 MED ORDER — CELECOXIB 200 MG PO CAPS: 200.0000 mg | ORAL_CAPSULE | Freq: Every day | ORAL | Status: DC

## 2014-12-24 NOTE — Telephone Encounter (Signed)
Prescription for celecoxib (CELEBREX) 200 MG capsule [[id number] needs to be for a whole year and the diagnosis is for inflammation and is permanent. Pharmacy is [facility]

## 2014-12-24 NOTE — Telephone Encounter (Signed)
Sent to pharmacy 
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
## 2015-01-16 NOTE — Telephone Encounter (Signed)
We would have to discuss how much she is taking and if that is reasonable. We do not write 90 day scripts for narcotics however.

## 2015-01-16 NOTE — Telephone Encounter (Signed)
Tried to reach patient to schedule an office visit. No answer and no voice mail.

## 2015-01-29 ENCOUNTER — Other Ambulatory Visit (INDEPENDENT_AMBULATORY_CARE_PROVIDER_SITE_OTHER): Payer: Medicare Other

## 2015-01-29 ENCOUNTER — Encounter: Payer: Self-pay | Admitting: Internal Medicine

## 2015-01-29 ENCOUNTER — Ambulatory Visit (INDEPENDENT_AMBULATORY_CARE_PROVIDER_SITE_OTHER): Payer: Medicare Other | Admitting: Internal Medicine

## 2015-01-29 VITALS — BP 162/90 | HR 93 | Temp 98.9°F | Resp 20 | Wt 172.0 lb

## 2015-01-29 DIAGNOSIS — I10 Essential (primary) hypertension: Secondary | ICD-10-CM

## 2015-01-29 DIAGNOSIS — E119 Type 2 diabetes mellitus without complications: Secondary | ICD-10-CM

## 2015-01-29 DIAGNOSIS — M545 Low back pain, unspecified: Secondary | ICD-10-CM

## 2015-01-29 LAB — HEMOGLOBIN A1C: HEMOGLOBIN A1C: 6.4 % (ref 4.6–6.5)

## 2015-01-29 MED ORDER — DULOXETINE HCL 30 MG PO CPEP: 30.0000 mg | ORAL_CAPSULE | Freq: Every day | ORAL | Status: DC

## 2015-01-29 NOTE — Progress Notes (Signed)
Pre visit review using our clinic review tool, if applicable. No additional management support is needed unless otherwise documented below in the visit note. 

## 2015-01-29 NOTE — Patient Instructions (Signed)
We would like you to try cymbalta which is a medicine for nerve pain that I think will help you. Take 1 pill a day (30 mg) for the next month. If it helps we can increase you to the full dose which is 60 mg daily.   We are checking your blood work today for the sugars.   I will also work to see if we can get the 24 hour blood pressure monitoring done as I worry that your blood pressure is elevated all the time. If the blood pressure is elevated then I do think that you would benefit from a medicine for blood pressure to decrease your risk of heart attack and stroke.

## 2015-01-31 NOTE — Progress Notes (Signed)
   Subjective:    Patient ID: [name], female    DOB: October 13, 1929, 81 y.o.   MRN: [id number]  HPI The patient is an 81 YO female coming in for her back pain and chronic pain. She is out of some of her medications and needs refills. She does have a bottle of percocet from January from a different doctor and wants #180 for 1 month and there are still about 10-20 pills in the bottle. She is also taking soma and celebrex for the pain and some days she cannot get out of bed. She has a lot of nerve pain. Her blood pressure is high but she insists that it is not at home and it is her nerves and if she would take her valium twice a day as she is instructed it would not be high. She arrived late to the visit and was very insulted that the receptionist asked her if she was late. She did create a scene in the waiting room and was rude to the receptionist.   Review of Systems  Constitutional: Negative for fever, activity change, appetite change, fatigue and unexpected weight change.  HENT: Negative.   Respiratory: Negative for cough, chest tightness, shortness of breath and wheezing.   Cardiovascular: Negative for chest pain, palpitations and leg swelling.  Gastrointestinal: Negative for nausea, abdominal pain, diarrhea, constipation and abdominal distention.  Musculoskeletal: Positive for myalgias, back pain and arthralgias. Negative for gait problem.  Skin: Negative.   Neurological: Negative.       Objective:   Physical Exam  Constitutional: She appears well-developed and well-nourished.  HENT:  Head: Normocephalic and atraumatic.  Eyes: EOM are normal.  Neck: Normal range of motion.  Cardiovascular: Normal rate and regular rhythm.   Pulmonary/Chest: Effort normal and breath sounds normal. No respiratory distress. She has no wheezes. She has no rales.  Abdominal: Soft. Bowel sounds are normal. She exhibits no distension. There is no tenderness. There is no rebound.   Filed Vitals:   01/29/15 1111  BP: 162/90  Pulse: 93  Temp: 98.9 F (37.2 C)  TempSrc: Oral  Resp: 20  Weight: 172 lb (78.019 kg)  SpO2: 93%      Assessment & Plan:

## 2015-01-31 NOTE — Assessment & Plan Note (Signed)
Did advise her that I would not be able to provide a 3 month prescription for narcotics and that I would not be able to provide as many as she had (#180 from February). Interestingly I ran her name through the [place] narcotic database in April and they did not have her February medication fill (I forgot to look at year perhaps the bottle was from 1 year ago). She then abruptly decided that she did not want to get her pain medication from me if I would not do what she wanted. Also interestingly she claims to be taking valium quite frequently but this also did not show up on the controlled substances database.

## 2015-01-31 NOTE — Assessment & Plan Note (Signed)
Once again her BP is elevated and she does not believe that it is and insists that it is normal at home. I spoke with her at length that uncontrolled high blood pressure is dangerous and that valium is not a treatment for high blood pressure and that temperament does not cause high blood pressure and that she should not be taking valium daily. Talked to her about 24 hour BP monitoring and will see if a home health agency can arrange that so we can see how her BP runs.

## 2015-02-17 ENCOUNTER — Other Ambulatory Visit: Payer: Self-pay

## 2015-03-25 ENCOUNTER — Encounter (HOSPITAL_COMMUNITY): Payer: Self-pay | Admitting: *Deleted

## 2015-03-25 ENCOUNTER — Emergency Department (HOSPITAL_COMMUNITY)
Admission: EM | Admit: 2015-03-25 | Discharge: 2015-03-25 | Disposition: A | Discharge status: Home / Self Care | Payer: Medicare Other | Attending: Emergency Medicine | Admitting: Emergency Medicine

## 2015-03-25 DIAGNOSIS — Z791 Long term (current) use of non-steroidal anti-inflammatories (NSAID): Secondary | ICD-10-CM | POA: Diagnosis not present

## 2015-03-25 DIAGNOSIS — Z79899 Other long term (current) drug therapy: Secondary | ICD-10-CM | POA: Diagnosis not present

## 2015-03-25 DIAGNOSIS — F329 Major depressive disorder, single episode, unspecified: Secondary | ICD-10-CM | POA: Insufficient documentation

## 2015-03-25 DIAGNOSIS — K648 Other hemorrhoids: Secondary | ICD-10-CM | POA: Insufficient documentation

## 2015-03-25 DIAGNOSIS — Z87891 Personal history of nicotine dependence: Secondary | ICD-10-CM | POA: Diagnosis not present

## 2015-03-25 DIAGNOSIS — Z7952 Long term (current) use of systemic steroids: Secondary | ICD-10-CM | POA: Insufficient documentation

## 2015-03-25 DIAGNOSIS — I10 Essential (primary) hypertension: Secondary | ICD-10-CM | POA: Insufficient documentation

## 2015-03-25 DIAGNOSIS — Z792 Long term (current) use of antibiotics: Secondary | ICD-10-CM | POA: Insufficient documentation

## 2015-03-25 DIAGNOSIS — M810 Age-related osteoporosis without current pathological fracture: Secondary | ICD-10-CM | POA: Insufficient documentation

## 2015-03-25 DIAGNOSIS — Z86010 Personal history of colonic polyps: Secondary | ICD-10-CM | POA: Insufficient documentation

## 2015-03-25 DIAGNOSIS — K644 Residual hemorrhoidal skin tags: Secondary | ICD-10-CM | POA: Insufficient documentation

## 2015-03-25 DIAGNOSIS — Z8639 Personal history of other endocrine, nutritional and metabolic disease: Secondary | ICD-10-CM | POA: Diagnosis not present

## 2015-03-25 DIAGNOSIS — K921 Melena: Secondary | ICD-10-CM | POA: Diagnosis not present

## 2015-03-25 DIAGNOSIS — Z8742 Personal history of other diseases of the female genital tract: Secondary | ICD-10-CM | POA: Insufficient documentation

## 2015-03-25 DIAGNOSIS — Z853 Personal history of malignant neoplasm of breast: Secondary | ICD-10-CM | POA: Diagnosis not present

## 2015-03-25 DIAGNOSIS — F419 Anxiety disorder, unspecified: Secondary | ICD-10-CM | POA: Diagnosis not present

## 2015-03-25 DIAGNOSIS — Z8781 Personal history of (healed) traumatic fracture: Secondary | ICD-10-CM | POA: Insufficient documentation

## 2015-03-25 DIAGNOSIS — R195 Other fecal abnormalities: Secondary | ICD-10-CM

## 2015-03-25 LAB — CBC WITH DIFFERENTIAL/PLATELET
Basophils Absolute: 0 10*3/uL (ref 0.0–0.1)
Basophils Absolute: 0 10*3/uL (ref 0.0–0.1)
Basophils Relative: 0 % (ref 0–1)
Basophils Relative: 0 % (ref 0–1)
Eosinophils Absolute: 0.1 10*3/uL (ref 0.0–0.7)
Eosinophils Absolute: 0.1 10*3/uL (ref 0.0–0.7)
Eosinophils Relative: 1 % (ref 0–5)
Eosinophils Relative: 1 % (ref 0–5)
HCT: 44.6 % (ref 36.0–46.0)
HEMATOCRIT: 43.7 % (ref 36.0–46.0)
HEMOGLOBIN: 15.2 g/dL — AB (ref 12.0–15.0)
Hemoglobin: 14.7 g/dL (ref 12.0–15.0)
LYMPHS PCT: 35 % (ref 12–46)
Lymphocytes Relative: 27 % (ref 12–46)
Lymphs Abs: 2.5 10*3/uL (ref 0.7–4.0)
Lymphs Abs: 3.3 10*3/uL (ref 0.7–4.0)
MCH: 29.9 pg (ref 26.0–34.0)
MCH: 29.6 pg (ref 26.0–34.0)
MCHC: 34.1 g/dL (ref 30.0–36.0)
MCHC: 33.6 g/dL (ref 30.0–36.0)
MCV: 87.6 fL (ref 78.0–100.0)
MCV: 88.1 fL (ref 78.0–100.0)
MONO ABS: 0.5 10*3/uL (ref 0.1–1.0)
MONO ABS: 0.6 10*3/uL (ref 0.1–1.0)
MONOS PCT: 6 % (ref 3–12)
Monocytes Relative: 5 % (ref 3–12)
NEUTROS PCT: 67 % (ref 43–77)
NEUTROS PCT: 58 % (ref 43–77)
Neutro Abs: 6.1 10*3/uL (ref 1.7–7.7)
Neutro Abs: 5.5 10*3/uL (ref 1.7–7.7)
PLATELETS: 184 10*3/uL (ref 150–400)
Platelets: 191 10*3/uL (ref 150–400)
RBC: 5.09 MIL/uL (ref 3.87–5.11)
RBC: 4.96 MIL/uL (ref 3.87–5.11)
RDW: 12.3 % (ref 11.5–15.5)
RDW: 12.4 % (ref 11.5–15.5)
WBC: 9.1 10*3/uL (ref 4.0–10.5)
WBC: 9.5 10*3/uL (ref 4.0–10.5)

## 2015-03-25 LAB — COMPREHENSIVE METABOLIC PANEL
ALBUMIN: 3.6 g/dL (ref 3.5–5.0)
ALK PHOS: 58 U/L (ref 38–126)
ALT: 16 U/L (ref 14–54)
AST: 28 U/L (ref 15–41)
Anion gap: 8 (ref 5–15)
BUN: 11 mg/dL (ref 6–20)
CALCIUM: 8.6 mg/dL — AB (ref 8.9–10.3)
CO2: 30 mmol/L (ref 22–32)
Chloride: 100 mmol/L — ABNORMAL LOW (ref 101–111)
Creatinine, Ser: 0.83 mg/dL (ref 0.44–1.00)
GFR calc non Af Amer: >60 mL/min (ref >60)
Glucose, Bld: 156 mg/dL — ABNORMAL HIGH (ref 65–99)
Potassium: 4.1 mmol/L (ref 3.5–5.1)
Sodium: 138 mmol/L (ref 135–145)
TOTAL PROTEIN: 5.8 g/dL — AB (ref 6.5–8.1)
Total Bilirubin: 1.1 mg/dL (ref 0.3–1.2)

## 2015-03-25 LAB — TYPE AND SCREEN
ABO/RH(D): A POS
Antibody Screen: NEGATIVE

## 2015-03-25 LAB — PROTIME-INR
INR: 1.06 (ref 0.00–1.49)
Prothrombin Time: 14 seconds (ref 11.6–15.2)

## 2015-03-25 LAB — POC OCCULT BLOOD, ED: FECAL OCCULT BLD: NEGATIVE

## 2015-03-25 LAB — APTT: aPTT: 28 seconds (ref 24–37)

## 2015-03-25 LAB — ABO/RH: ABO/RH(D): A POS

## 2015-03-25 NOTE — Discharge Instructions (Signed)
No evidence of bleeding today.  Your lab work was ok. Please follow-up with Dr. Carlean Purl-- call to make appt regarding dark stools. May follow-up with your primary care physician for other concerns. Return here for any new/worsening symptoms.

## 2015-03-25 NOTE — ED Notes (Signed)
Pt is in stable condition upon d/c and is escorted from ED via wheelchair. 

## 2015-03-25 NOTE — ED Notes (Signed)
Pt states she had lower abdominal pain and dark black stools yesterday accompanied by diarrhea. Pt is currently pain free and endorses having a formed dark black stool this morning.

## 2015-03-25 NOTE — ED Provider Notes (Signed)
CSN: [id number]     Arrival date & time 03/25/15  1044 History   First MD Initiated Contact with Patient 03/25/15 1046     Chief Complaint  Patient presents with  . Melena     (Consider location/radiation/quality/duration/timing/severity/associated sxs/prior Treatment) The history is provided by the patient and medical records.    This is an 81 y.o. F with hx of anxiety, chronic back pain, IBS, hemorrhoids, diverticulosis, polycythemia, thyroid disease, polycythemia vera, depression, presenting to the ED for black stools. Patient states yesterday she had a very loose bowel movement that was black in color with "specks" in it. She denies any bright red bleeding. States this morning her stool was formed but remained black. She denies any iron supplementation or anticoagulants. No recent NSAID use, taken tylenol only.  Patient has no history of GI bleeding in the past. She did have hemorrhoid surgery last year. She is followed by GI, Dr. Carlean Purl.  She states she feels generally weak.  Denies current abdominal pain, nausea, or vomiting.  No fever, chills, sweats.  No chest pain or SOB.  Past Medical History  Diagnosis Date  . ANXIETY 10/13/2007  . BACK PAIN 03/01/2007  . BREAST CANCER, HX OF 12/06/2007  . CONSTIPATION 01/06/2009  . DIVERTICULOSIS, COLON 05/11/2005  . External hemorrhoids without mention of complication 01/27/3709  . FATIGUE 08/07/2008  . HEMORRHOIDS, INTERNAL 05/11/2005  . Hypercalcemia 04/23/2009  . HYPERLIPIDEMIA NEC/NOS 03/01/2007  . HYPERTENSION 10/13/2007  . Impaired fasting glucose 11/14/2008  . INTERSTITIAL CYSTITIS 09/06/2007  . Irritable bowel syndrome 01/06/2009  . LIVER MASS 05/15/2009  . OSTEOPOROSIS 05/09/2009  . PARESTHESIA 09/06/2007  . POLYCYTHEMIA 04/23/2009  . STENOSIS, SPINAL, UNSPC REGION 03/01/2007  . Thyroid disease   . Diverticular disease   . Allergic     Anectine  . Complication of anesthesia     family aggergy to annectine  . DEPRESSION 10/13/2007  . Cancer      RIGHT mastectomy with node dissection  . Compression fracture of L4 lumbar vertebra 02/2013  . Adenomatous colon polyp 04/2005   Past Surgical History  Procedure Laterality Date  . Back surgery    . Cholecystectomy    . Abdominal hysterectomy    . Breast surgery      mastectomy -right  . Cervical spine surgery    . Fixation kyphoplasty lumbar spine    . Parathyroid exploration  06/29/2011    Procedure: PARATHYROID EXPLORATION;  Surgeon: Earnstine Regal, MD;  Location: [facility];  Service: General;  Laterality: Left;  Left Superior Parathyroidectomy  . Parathyroidectomy  06/29/11  . Cortisone injection  10/16/12    left knee  . Colonoscopy w/ biopsies    . Hemorrhoid banding  2015-16   Family History  Problem Relation Age of Onset  . Heart disease Sister   . Hypertension Sister   . Asthma Sister   . Breast cancer Maternal Aunt   . Ovarian cancer Paternal Aunt   . Colon cancer Neg Hx   . Esophageal cancer Neg Hx   . Pancreatic cancer Neg Hx   . Rectal cancer Neg Hx   . Stomach cancer Neg Hx    History  Substance Use Topics  . Smoking status: Former Smoker -- 1.00 packs/day for 42 years    Types: Cigarettes    Quit date: 08/23/1977  . Smokeless tobacco: Never Used  . Alcohol Use: No   OB History    No data available     Review of Systems  Gastrointestinal:       Black stools  All other systems reviewed and are negative.     Allergies  Other; Anectine; Decongest; Dilaudid; Hydrocodone-acetaminophen; Iodine; Naproxen sodium; Oxycodone hcl; Pentosan polysulfate sodium; Pseudoephedrine; Succinylcholine; Tape; Tramadol hcl; Vioxx; and Shellfish allergy  Home Medications   Prior to Admission medications   Medication Sig Start Date End Date Taking? Authorizing Provider  Attapulgite (KAOPECTATE PO) Take 5 mLs by mouth daily as needed (for diarrhea).     Historical Provider, MD  calcium carbonate (OS-CAL) 600 MG TABS Take 600 mg by mouth daily.    Historical Provider, MD   carisoprodol (SOMA) 250 MG tablet Take 1 tablet (250 mg total) by mouth daily as needed (Pt is also on the 350 mg Soma.). For muscle spasms. 12/17/14   Olga Millers, MD  carisoprodol (SOMA) 350 MG tablet Take 1 tablet (350 mg total) by mouth at bedtime as needed for muscle spasms (Pt is also taking 250 mg Soma.). 12/17/14   Olga Millers, MD  celecoxib (CELEBREX) 200 MG capsule Take 1 capsule (200 mg total) by mouth daily. 12/24/14   Olga Millers, MD  Cholecalciferol (VITAMIN D3) 1000 UNITS CAPS Take 1,000 Units by mouth.    Historical Provider, MD  diazepam (VALIUM) 5 MG tablet Take 5 mg by mouth as needed. BRAND NAMES ONLY.    Historical Provider, MD  DULoxetine (CYMBALTA) 30 MG capsule Take 1 capsule (30 mg total) by mouth daily. 01/29/15   Olga Millers, MD  estradiol (ESTRACE) 0.1 MG/GM vaginal cream Place 2 g vaginally daily as needed (for dryness).     Historical Provider, MD  estrogen, conjugated,-medroxyprogesterone (PREMPRO) 0.3-1.5 MG per tablet Take 1 tablet by mouth every evening.    Historical Provider, MD  fluticasone (FLONASE) 50 MCG/ACT nasal spray Place 2 sprays into both nostrils daily. Patient taking differently: Place 2 sprays into both nostrils as needed.  09/06/14   Olga Millers, MD  hydrocortisone (ANUSOL-HC) 25 MG suppository Place 1 suppository (25 mg total) rectally 2 (two) times daily. Patient taking differently: Place 25 mg rectally as needed.  09/11/14   Jessica D Zehr, PA-C  metroNIDAZOLE (METROGEL) 1 % gel Apply 1 application topically daily.  09/19/13   Historical Provider, MD  OVER THE COUNTER MEDICATION Take 1 tablet by mouth every other day. Perfect Prenatal Multivitamin. She alternates with taking a Multivitamin tablet.    Historical Provider, MD  OVER THE COUNTER MEDICATION Take 1 capsule by mouth daily as needed (stomach upset.). GI Flora. This contains Lactobacillus acidophilus, L. rhamnosus, L. casei, and Bifidobacterium longum.     Historical Provider, MD  VISTARIL 25 MG capsule Take 25 mg by mouth at bedtime. 02/22/13   Historical Provider, MD  Wheat Dextrin (BENEFIBER) POWD Take by mouth. Take one tablespoon daily.    Historical Provider, MD  Zoledronic Acid (RECLAST IV) Inject into the vein. Once a year.    Historical Provider, MD   BP 153/64 mmHg  Pulse 99  Temp(Src) 97.9 F (36.6 C) (Oral)  Ht 5\' 4"  (1.626 m)  Wt 176 lb 14.4 oz (80.241 kg)  BMI 30.35 kg/m2  SpO2 95%   Physical Exam  Constitutional: She is oriented to person, place, and time. She appears well-developed and well-nourished. No distress.  HENT:  Head: Normocephalic and atraumatic.  Mouth/Throat: Oropharynx is clear and moist.  Eyes: Conjunctivae and EOM are normal. Pupils are equal, round, and reactive to light.  Neck: Normal range of motion. Neck  supple.  Cardiovascular: Normal rate, regular rhythm and normal heart sounds.   Pulmonary/Chest: Effort normal and breath sounds normal. No respiratory distress. She has no wheezes.  Abdominal: Soft. Bowel sounds are normal. There is no tenderness. There is no guarding.  Genitourinary: Rectal exam shows external hemorrhoid and internal hemorrhoid. Rectal exam shows no fissure, no mass, no tenderness and anal tone normal. Guaiac negative stool.  External and internal hemorrhoids noted, no active bleeding; dark brown/black stool noted on glove, no bright red blood; guaiac negative  Musculoskeletal: Normal range of motion. She exhibits no edema.  Neurological: She is alert and oriented to person, place, and time.  Skin: Skin is warm and dry. She is not diaphoretic.  Psychiatric: She has a normal mood and affect.  Nursing note and vitals reviewed.   ED Course  Procedures (including critical care time) Labs Review Labs Reviewed  CBC WITH DIFFERENTIAL/PLATELET - Abnormal; Notable for the following:    Hemoglobin 15.2 (*)    All other components within normal limits  COMPREHENSIVE METABOLIC PANEL -  Abnormal; Notable for the following:    Chloride 100 (*)    Glucose, Bld 156 (*)    Calcium 8.6 (*)    Total Protein 5.8 (*)    All other components within normal limits  APTT  PROTIME-INR  CBC WITH DIFFERENTIAL/PLATELET  POC OCCULT BLOOD, ED  TYPE AND SCREEN  ABO/RH    Imaging Review No results found.   EKG Interpretation None      MDM   Final diagnoses:  Dark stools   81 y.o. F here with dark stools.  No hx of GI bleeding, no anti-coagulants or recent NSAID use.  Patient followed by GI, Dr. Michail Sermon-- hx of internal hemorrhoids with banding last year.  Patient afebrile, nontoxic. She is in no acute distress. Her abdomen is nontender and she has no complaint of abdominal pain currently. She does have internal and external hemorrhoids noted on rectal exam, no gross blood. Stool is dark in color. Labwork as above, guaiac negative. Initial and 3 hour hemoglobin is stable. Vital signs remain stable on room air. Given that there is no evidence of active bleeding, repeat hemoglobin remains stable after period ff observation in the ED, and patient has no pain, do not feel that further workup is indicated at this time. I've encouraged patient to follow-up with her GI physician. She may also follow-up with her PCP.  Discussed plan with patient, he/she acknowledged understanding and agreed with plan of care.  Return precautions given for new or worsening symptoms.  Case discussed with attending physician, Dr. Darl Householder, who personally evaluated patient and agrees with assessment and plan of care.  Larene Pickett, PA-C 03/25/15 1747  Wandra Arthurs, MD 03/26/15 [phone]

## 2015-03-26 ENCOUNTER — Ambulatory Visit (INDEPENDENT_AMBULATORY_CARE_PROVIDER_SITE_OTHER): Payer: Medicare Other | Admitting: Internal Medicine

## 2015-03-26 ENCOUNTER — Encounter: Payer: Self-pay | Admitting: Internal Medicine

## 2015-03-26 ENCOUNTER — Telehealth: Payer: Self-pay | Admitting: Internal Medicine

## 2015-03-26 VITALS — BP 180/98 | HR 112 | Temp 99.2°F | Resp 18 | Wt 175.1 lb

## 2015-03-26 DIAGNOSIS — K625 Hemorrhage of anus and rectum: Secondary | ICD-10-CM

## 2015-03-26 DIAGNOSIS — I10 Essential (primary) hypertension: Secondary | ICD-10-CM

## 2015-03-26 NOTE — Progress Notes (Signed)
Pre visit review using our clinic review tool, if applicable. No additional management support is needed unless otherwise documented below in the visit note. 

## 2015-03-26 NOTE — Telephone Encounter (Signed)
Patient was heme negative in the ER and Hgb is 15.  She says that she needs to have an EGD because the dark stools.  She is insistent that she needs an EGD.,  She is very upset that she can't be scheduled for a direct EGD.  I scheduled her for a office visit with Dr. Carlean Purl for 05/27/15 11:30

## 2015-03-26 NOTE — Telephone Encounter (Signed)
Left message for patient to call back  

## 2015-03-26 NOTE — Patient Instructions (Addendum)
We will call the GI office to see if we can get you in with them sooner to see if they can decide if you need any more work up of the dark stools.   Percocet is a very strong medicine that I do not prescribe for 3 month supplies for any patients.

## 2015-03-27 NOTE — Assessment & Plan Note (Signed)
She did have episodes of dark stools and we will contact GI for visit. CBC was stable and no repeat episodes.

## 2015-03-27 NOTE — Progress Notes (Signed)
   Subjective:    Patient ID: [name], female    DOB: Nov 25, 1929, 81 y.o.   MRN: [id number]  HPI The patient is an 81 YO female coming in for ER follow up. She was there yesterday with dark stools. CBC was normal and hemoccult negative in the ER. They recommended follow up with GI and she would like [place] to call them. No repeat episodes since being home, no abdominal pain, no nausea. No diarrhea. She denies taking iron pills or excessive milk of magnesia. She is up to date on colonoscopy (last 10/2013)  Review of Systems  Constitutional: Negative for fever, activity change, appetite change, fatigue and unexpected weight change.  HENT: Negative.   Respiratory: Negative for cough, chest tightness, shortness of breath and wheezing.   Cardiovascular: Negative for chest pain, palpitations and leg swelling.  Gastrointestinal: Negative for nausea, abdominal pain, diarrhea, constipation and abdominal distention.  Musculoskeletal: Positive for myalgias, back pain and arthralgias. Negative for gait problem.  Skin: Negative.   Neurological: Negative.       Objective:   Physical Exam  Constitutional: She appears well-developed and well-nourished.  HENT:  Head: Normocephalic and atraumatic.  Eyes: EOM are normal.  Neck: Normal range of motion.  Cardiovascular: Normal rate and regular rhythm.   Pulmonary/Chest: Effort normal and breath sounds normal. No respiratory distress. She has no wheezes. She has no rales.  Abdominal: Soft. Bowel sounds are normal. She exhibits no distension. There is no tenderness. There is no rebound.   Filed Vitals:   03/26/15 1054  BP: 180/98  Pulse: 112  Temp: 99.2 F (37.3 C)  TempSrc: Oral  Resp: 18  Weight: 175 lb 1.9 oz (79.434 kg)  SpO2: 99%      Assessment & Plan:

## 2015-03-27 NOTE — Assessment & Plan Note (Signed)
Did have a discussion with her again about the seriousness of her blood pressure and she again refuses to take any medicine but valium for it. Again I ran her through the [place] narcotic database and it appears that every 3-6 months she does get a valium prescription however the name of the provider on the rx is an OB/Gyn which is not sensical. This provider is also rxing soma and percocet in the last year to her. She does refuse to try any blood pressure medication despite the fact that her blood pressure are dangerously high at time during her visits and likely are running this way at home as well. Her husband was present with her today and I tried to talk to him about it as well but he did not have anything to add to the conversation.

## 2015-04-17 ENCOUNTER — Telehealth: Payer: Self-pay | Admitting: Internal Medicine
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
## 2015-04-18 ENCOUNTER — Other Ambulatory Visit: Payer: Self-pay | Admitting: Geriatric Medicine

## 2015-04-18 NOTE — Telephone Encounter (Signed)
Patient states she is having worse diarrhea and is very grouchy. She did not want a visit and she did not want to try imodium to stop the diarrhea. She just wants a letter to get reimbursed for her plane ticket.

## 2015-04-18 NOTE — Telephone Encounter (Signed)
Left patient a message telling her she could come pick up her letter.

## 2015-04-18 NOTE — Telephone Encounter (Signed)
Able to do a letter but unable to fulfil all her requests. Will state that her medical condition has worsened and I feel she should be able to change without penalty.

## 2015-05-01 ENCOUNTER — Ambulatory Visit (INDEPENDENT_AMBULATORY_CARE_PROVIDER_SITE_OTHER)
Admission: RE | Admit: 2015-05-01 | Discharge: 2015-05-01 | Disposition: A | Discharge status: Home / Self Care | Payer: Medicare Other | Source: Ambulatory Visit | Attending: Internal Medicine | Admitting: Internal Medicine

## 2015-05-01 ENCOUNTER — Other Ambulatory Visit (INDEPENDENT_AMBULATORY_CARE_PROVIDER_SITE_OTHER): Payer: Medicare Other

## 2015-05-01 ENCOUNTER — Encounter: Payer: Self-pay | Admitting: Internal Medicine

## 2015-05-01 ENCOUNTER — Ambulatory Visit (INDEPENDENT_AMBULATORY_CARE_PROVIDER_SITE_OTHER): Payer: Medicare Other | Admitting: Internal Medicine

## 2015-05-01 VITALS — BP 148/100 | HR 112 | Temp 97.9°F | Wt 172.0 lb

## 2015-05-01 DIAGNOSIS — R000 Tachycardia, unspecified: Secondary | ICD-10-CM | POA: Diagnosis not present

## 2015-05-01 DIAGNOSIS — M79674 Pain in right toe(s): Secondary | ICD-10-CM

## 2015-05-01 DIAGNOSIS — S91119A Laceration without foreign body of unspecified toe without damage to nail, initial encounter: Secondary | ICD-10-CM

## 2015-05-01 DIAGNOSIS — I10 Essential (primary) hypertension: Secondary | ICD-10-CM | POA: Diagnosis not present

## 2015-05-01 LAB — TSH: TSH: 2.07 u[IU]/mL (ref 0.35–4.50)

## 2015-05-01 LAB — CBC WITH DIFFERENTIAL/PLATELET
Basophils Absolute: 0 10*3/uL (ref 0.0–0.1)
Basophils Relative: 0.4 % (ref 0.0–3.0)
EOS ABS: 0.1 10*3/uL (ref 0.0–0.7)
Eosinophils Relative: 0.8 % (ref 0.0–5.0)
HCT: 47 % — ABNORMAL HIGH (ref 36.0–46.0)
Hemoglobin: 15.9 g/dL — ABNORMAL HIGH (ref 12.0–15.0)
LYMPHS PCT: 33.4 % (ref 12.0–46.0)
Lymphs Abs: 3 10*3/uL (ref 0.7–4.0)
MCHC: 33.8 g/dL (ref 30.0–36.0)
MCV: 88.5 fl (ref 78.0–100.0)
MONO ABS: 0.5 10*3/uL (ref 0.1–1.0)
Monocytes Relative: 5.6 % (ref 3.0–12.0)
Neutro Abs: 5.4 10*3/uL (ref 1.4–7.7)
Neutrophils Relative %: 59.8 % (ref 43.0–77.0)
Platelets: 228 10*3/uL (ref 150.0–400.0)
RBC: 5.31 Mil/uL — AB (ref 3.87–5.11)
RDW: 13.3 % (ref 11.5–15.5)
WBC: 8.9 10*3/uL (ref 4.0–10.5)

## 2015-05-01 MED ORDER — METOPROLOL TARTRATE 25 MG PO TABS: ORAL_TABLET | ORAL | Status: DC

## 2015-05-01 NOTE — Progress Notes (Signed)
   Subjective:    Patient ID: [name], female    DOB: July 23, 1930, 81 y.o.   MRN: [id number]  HPI She was walking in a parking lot 04/18/15 and had a mechanical fall. She states that she has chronic severe nerve pain in her feet and her feet stay sweaty. Her feet slipped in her sandals resulting in the fall. There was no cardiac or neurologic prodrome prior to the fall.  She fell on her palms and knees. She also sustained laceration to the ventral surface of the base of the right great toe.  No medical treatment was pursued. She continues to have pain in the base of the toe prompting this visit almost 2 weeks later.  Review of Systems  Denied were any change in heart rhythm or rate prior to the event. There was no associated chest pain or shortness of breath .  Also specifically denied prior to the episode were headache, limb weakness, tingling, or numbness. No seizure activity noted.  She states that she's had elevated blood pressure for years. She was intolerant or resistant to multiple therapies by her report. She is been prescribed Valium for hypertension. "My BP was never normal except when I was seeing Dr Lyla Son".  She was unaware of her tachycardia. Her last TSH was 1.34 in 2014. Chemistries were normal 03/25/15.      Objective:   Physical Exam  Pertinent or positive findings include: She exhibits a resting tachycardia of 100-112. Repeat blood pressure was 140/100. There is an irregular eschar at the base of the left palm suggesting previous abrasion. There is a 19 mm linear eschar at the base of the right great toe. This is surrounded by an area 15 x 20 mm of ecchymosis. This area is tender to touch. There is no evidence of vesicle, pustule, or cellulitic change. There is very faint bruising over the right patella and above the left patella.  General appearance :adequately nourished; in no distress.  Eyes: No conjunctival inflammation or scleral icterus is  present.  Oral exam:  Lips and gums are healthy appearing.There is no oropharyngeal erythema or exudate noted. Dental hygiene is good.  Heart:   regular rhythm. S1 and S2 normal without  murmur, click, rub or other extra sounds    Lungs:Chest clear to auscultation; no wheezes, rhonchi,rales ,or rubs present.No increased work of breathing.   Abdomen: bowel sounds normal, soft and non-tender without masses, organomegaly or hernias noted.  No guarding or rebound.   Vascular : all pulses equal ; no bruits present.  Skin:Warm & dry.  Intact without suspicious lesions or rashes ; no tenting or jaundice   Lymphatic: No lymphadenopathy is noted about the head, neck, axilla.   Neuro: Strength, tone  normal.     Assessment & Plan:  #1 toe pain, post fall. Rule out fracture   #2 hypertension  #3 tachycardia  Plan: Lopressor 25 mg twice daily was prescribed. She states she cannot take generics. Labs will be checked.

## 2015-05-01 NOTE — Progress Notes (Signed)
Pre visit review using our clinic review tool, if applicable. No additional management support is needed unless otherwise documented below in the visit note. 

## 2015-05-01 NOTE — Patient Instructions (Signed)
  Your next office appointment will be determined based upon review of your pending labs  and  xrays  Those written interpretation of the lab results and instructions will be transmitted to you by mail for your records.  Critical results will be called.   Followup as needed for any active or acute issue. Please report any significant change in your symptoms.  Minimal Blood Pressure Goal= AVERAGE < 140/90;  Ideal is an AVERAGE < 135/85. This AVERAGE should be calculated from @ least 5-7 BP readings taken @ different times of day on different days of week. You should not respond to isolated BP readings , but rather the AVERAGE for that week .Please bring your  blood pressure cuff to office visits to verify that it is reliable.It  can also be checked against the blood pressure device at the pharmacy. Finger or wrist cuffs are not dependable; an arm cuff is. 

## 2015-05-26 ENCOUNTER — Emergency Department (HOSPITAL_COMMUNITY): Payer: Medicare Other

## 2015-05-26 ENCOUNTER — Encounter (HOSPITAL_COMMUNITY): Payer: Self-pay | Admitting: Emergency Medicine

## 2015-05-26 ENCOUNTER — Emergency Department (HOSPITAL_COMMUNITY)
Admission: EM | Admit: 2015-05-26 | Discharge: 2015-05-26 | Disposition: A | Discharge status: Home / Self Care | Payer: Medicare Other | Attending: Emergency Medicine | Admitting: Emergency Medicine

## 2015-05-26 DIAGNOSIS — Z79899 Other long term (current) drug therapy: Secondary | ICD-10-CM | POA: Insufficient documentation

## 2015-05-26 DIAGNOSIS — Y9289 Other specified places as the place of occurrence of the external cause: Secondary | ICD-10-CM | POA: Diagnosis not present

## 2015-05-26 DIAGNOSIS — Z7951 Long term (current) use of inhaled steroids: Secondary | ICD-10-CM | POA: Insufficient documentation

## 2015-05-26 DIAGNOSIS — F419 Anxiety disorder, unspecified: Secondary | ICD-10-CM | POA: Insufficient documentation

## 2015-05-26 DIAGNOSIS — S2241XA Multiple fractures of ribs, right side, initial encounter for closed fracture: Secondary | ICD-10-CM | POA: Insufficient documentation

## 2015-05-26 DIAGNOSIS — S2231XA Fracture of one rib, right side, initial encounter for closed fracture: Secondary | ICD-10-CM

## 2015-05-26 DIAGNOSIS — Z8719 Personal history of other diseases of the digestive system: Secondary | ICD-10-CM | POA: Diagnosis not present

## 2015-05-26 DIAGNOSIS — S299XXA Unspecified injury of thorax, initial encounter: Secondary | ICD-10-CM | POA: Diagnosis present

## 2015-05-26 DIAGNOSIS — Y998 Other external cause status: Secondary | ICD-10-CM | POA: Insufficient documentation

## 2015-05-26 DIAGNOSIS — Z86018 Personal history of other benign neoplasm: Secondary | ICD-10-CM | POA: Diagnosis not present

## 2015-05-26 DIAGNOSIS — Y9389 Activity, other specified: Secondary | ICD-10-CM | POA: Diagnosis not present

## 2015-05-26 DIAGNOSIS — Z87891 Personal history of nicotine dependence: Secondary | ICD-10-CM | POA: Insufficient documentation

## 2015-05-26 DIAGNOSIS — Z853 Personal history of malignant neoplasm of breast: Secondary | ICD-10-CM | POA: Insufficient documentation

## 2015-05-26 DIAGNOSIS — F329 Major depressive disorder, single episode, unspecified: Secondary | ICD-10-CM | POA: Diagnosis not present

## 2015-05-26 DIAGNOSIS — I10 Essential (primary) hypertension: Secondary | ICD-10-CM | POA: Diagnosis not present

## 2015-05-26 DIAGNOSIS — Z791 Long term (current) use of non-steroidal anti-inflammatories (NSAID): Secondary | ICD-10-CM | POA: Diagnosis not present

## 2015-05-26 DIAGNOSIS — W2209XA Striking against other stationary object, initial encounter: Secondary | ICD-10-CM | POA: Insufficient documentation

## 2015-05-26 DIAGNOSIS — Z8639 Personal history of other endocrine, nutritional and metabolic disease: Secondary | ICD-10-CM | POA: Diagnosis not present

## 2015-05-26 MED ORDER — FENTANYL CITRATE (PF) 100 MCG/2ML IJ SOLN
50.0000 ug | Freq: Once | INTRAMUSCULAR | Status: AC
  Administered 2015-05-26: 50 ug via INTRAVENOUS
  Filled 2015-05-26: qty 2

## 2015-05-26 MED ORDER — OXYCODONE-ACETAMINOPHEN 5-325 MG PO TABS
2.0000 | ORAL_TABLET | Freq: Once | ORAL | Status: AC
  Administered 2015-05-26: 2 via ORAL
  Filled 2015-05-26: qty 2

## 2015-05-26 NOTE — ED Notes (Signed)
Bed: VX48 Expected date:  Expected time:  Means of arrival:  Comments: EMS-rib cage pain

## 2015-05-26 NOTE — ED Notes (Signed)
Instructions given r/t incentive spirometer

## 2015-05-26 NOTE — ED Notes (Signed)
Off floor for testing 

## 2015-05-26 NOTE — ED Provider Notes (Signed)
CSN: [id number]     Arrival date & time 05/26/15  1011 History   First MD Initiated Contact with Patient 05/26/15 1020     Chief Complaint  Patient presents with  . Rib Injury     (Consider location/radiation/quality/duration/timing/severity/associated sxs/prior Treatment) HPI [name] is a 81 y.o. female who comes in for evaluation of right-sided rib pain. Patient states earlier this morning she was carrying her dog outside, when the dog squirmed, caused patient to lose her balance and fall into door jamb. Patient reports she heard a pop and experienced sudden onset, severe right-sided rib pain. She reports inability to move right arm secondary to the pain. She reports that it is painful to take a deep breath. She denies any numbness, tingling or other weakness in her hands. Patient also reports that she just started taking blood pressure medicine, but has not taken it today. No other aggravating or modifying factors.  Past Medical History  Diagnosis Date  . ANXIETY 10/13/2007  . BACK PAIN 03/01/2007  . BREAST CANCER, HX OF 12/06/2007  . CONSTIPATION 01/06/2009  . DIVERTICULOSIS, COLON 05/11/2005  . External hemorrhoids without mention of complication 12/08/4079  . FATIGUE 08/07/2008  . HEMORRHOIDS, INTERNAL 05/11/2005  . Hypercalcemia 04/23/2009  . HYPERLIPIDEMIA NEC/NOS 03/01/2007  . HYPERTENSION 10/13/2007  . Impaired fasting glucose 11/14/2008  . INTERSTITIAL CYSTITIS 09/06/2007  . Irritable bowel syndrome 01/06/2009  . LIVER MASS 05/15/2009  . OSTEOPOROSIS 05/09/2009  . PARESTHESIA 09/06/2007  . POLYCYTHEMIA 04/23/2009  . STENOSIS, SPINAL, UNSPC REGION 03/01/2007  . Thyroid disease   . Diverticular disease   . Allergic     Anectine  . Complication of anesthesia     family aggergy to annectine  . DEPRESSION 10/13/2007  . Cancer [facility])     RIGHT mastectomy with node dissection  . Compression fracture of L4 lumbar vertebra (HCC) 02/2013  . Adenomatous colon polyp 04/2005   Past  Surgical History  Procedure Laterality Date  . Back surgery    . Cholecystectomy    . Abdominal hysterectomy    . Breast surgery      mastectomy -right  . Cervical spine surgery    . Fixation kyphoplasty lumbar spine    . Parathyroid exploration  06/29/2011    Procedure: PARATHYROID EXPLORATION;  Surgeon: Earnstine Regal, MD;  Location: [facility];  Service: General;  Laterality: Left;  Left Superior Parathyroidectomy  . Parathyroidectomy  06/29/11  . Cortisone injection  10/16/12    left knee  . Colonoscopy w/ biopsies    . Hemorrhoid banding  2015-16   Family History  Problem Relation Age of Onset  . Heart disease Sister   . Hypertension Sister   . Asthma Sister   . Breast cancer Maternal Aunt   . Ovarian cancer Paternal Aunt   . Colon cancer Neg Hx   . Esophageal cancer Neg Hx   . Pancreatic cancer Neg Hx   . Rectal cancer Neg Hx   . Stomach cancer Neg Hx    Social History  Substance Use Topics  . Smoking status: Former Smoker -- 1.00 packs/day for 42 years    Types: Cigarettes    Quit date: 08/23/1977  . Smokeless tobacco: Never Used  . Alcohol Use: No   OB History    No data available     Review of Systems A 10 point review of systems was completed and was negative except for pertinent positives and negatives as mentioned in the history of present  illness     Allergies  Other; Anectine; Decongest; Dilaudid; Hydrocodone-acetaminophen; Iodine; Naproxen sodium; Oxycodone hcl; Pentosan polysulfate sodium; Pseudoephedrine; Succinylcholine; Tape; Tramadol hcl; Vioxx; and Shellfish allergy  Home Medications   Prior to Admission medications   Medication Sig Start Date End Date Taking? Authorizing Provider  acetaminophen (TYLENOL) 500 MG tablet Take 1,000 mg by mouth every 6 (six) hours as needed for mild pain.   Yes Historical Provider, MD  Attapulgite (KAOPECTATE PO) Take 5 mLs by mouth daily as needed (for diarrhea).    Yes Historical Provider, MD  calcium carbonate  (OS-CAL) 600 MG TABS Take 600 mg by mouth daily.   Yes Historical Provider, MD  carisoprodol (SOMA) 250 MG tablet Take 1 tablet (250 mg total) by mouth daily as needed (Pt is also on the 350 mg Soma.). For muscle spasms. 12/17/14  Yes Hoyt Koch, MD  carisoprodol (SOMA) 350 MG tablet Take 1 tablet (350 mg total) by mouth at bedtime as needed for muscle spasms (Pt is also taking 250 mg Soma.). 12/17/14  Yes Hoyt Koch, MD  celecoxib (CELEBREX) 200 MG capsule Take 1 capsule (200 mg total) by mouth daily. 12/24/14  Yes Hoyt Koch, MD  Cholecalciferol (VITAMIN D3) 1000 UNITS CAPS Take 1,000 Units by mouth.   Yes Historical Provider, MD  diazepam (VALIUM) 5 MG tablet Take 5 mg by mouth 2 (two) times daily as needed for anxiety. BRAND NAMES ONLY.   Yes Historical Provider, MD  estradiol (ESTRACE) 0.1 MG/GM vaginal cream Place 2 g vaginally daily as needed (for dryness).    Yes Historical Provider, MD  estrogen, conjugated,-medroxyprogesterone (PREMPRO) 0.3-1.5 MG per tablet Take 1 tablet by mouth every evening.   Yes Historical Provider, MD  fluticasone (FLONASE) 50 MCG/ACT nasal spray Place 2 sprays into both nostrils daily. Patient taking differently: Place 2 sprays into both nostrils as needed for allergies.  09/06/14  Yes Hoyt Koch, MD  hydrocortisone (ANUSOL-HC) 25 MG suppository Place 1 suppository (25 mg total) rectally 2 (two) times daily. Patient taking differently: Place 25 mg rectally as needed for itching (IBS symptoms).  09/11/14  Yes Jessica D Zehr, PA-C  metoprolol tartrate (LOPRESSOR) 25 MG tablet Branded Lopressor 25 mg bid requested by patient 05/01/15  Yes Hendricks Limes, MD  metroNIDAZOLE (METROGEL) 1 % gel Apply 1 application topically daily.  09/19/13  Yes Historical Provider, MD  OVER THE COUNTER MEDICATION Take 1 tablet by mouth every other day. Perfect Prenatal Multivitamin. She alternates with taking a Multivitamin tablet.   Yes Historical Provider,  MD  PERCOCET 10-325 MG per tablet Take 0.5-1 tablets by mouth every 8 (eight) hours as needed for pain.  04/01/15  Yes Historical Provider, MD  VISTARIL 25 MG capsule Take 25 mg by mouth at bedtime. 02/22/13  Yes Historical Provider, MD  Wheat Dextrin (BENEFIBER) POWD Take by mouth. Take one tablespoon daily.   Yes Historical Provider, MD  Zoledronic Acid (RECLAST IV) Inject into the vein. Once a year.   Yes Historical Provider, MD   BP 168/75 mmHg  Pulse 104  Temp(Src) 97.4 F (36.3 C) (Oral)  Resp 16  SpO2 100% Physical Exam  Constitutional: She is oriented to person, place, and time. She appears well-developed and well-nourished.  HENT:  Head: Normocephalic and atraumatic.  Mouth/Throat: Oropharynx is clear and moist.  Eyes: Conjunctivae are normal. Pupils are equal, round, and reactive to light. Right eye exhibits no discharge. Left eye exhibits no discharge. No scleral icterus.  Neck: Neck supple.  Cardiovascular: Normal rate, regular rhythm and normal heart sounds.   Pulmonary/Chest: Effort normal and breath sounds normal. No respiratory distress. She has no wheezes. She has no rales.  Tenderness diffusely along the right axillary line. No bony step-offs, crepitus. No other lesions or deformities noted.  Abdominal: Soft. There is no tenderness.  Musculoskeletal: She exhibits no tenderness.  Neurological: She is alert and oriented to person, place, and time.  Cranial Nerves II-XII grossly intact  Skin: Skin is warm and dry. No rash noted.  Psychiatric: She has a normal mood and affect.  Nursing note and vitals reviewed.   ED Course  Procedures (including critical care time) Labs Review Labs Reviewed - No data to display  Imaging Review Dg Ribs Unilateral W/chest Right  05/26/2015   CLINICAL DATA:  [place] against a door this morning. Complains of right posterior shoulder and arm pain. Nonsmoker.  EXAM: RIGHT RIBS AND CHEST - 3+ VIEW  COMPARISON:  07/05/2013  FINDINGS: Shallow lung  inflation. Heart is mildly enlarged. There is mild perihilar pulmonary edema. No focal consolidations. Acute fractures of the right sixth and seventh ribs. No pneumothorax. Previous multilevel vertebroplasty. Previous axillary node dissection.  IMPRESSION: 1. Shallow inflation. 2. Mild edema. 3. Right sixth and seventh rib fractures.   Electronically Signed   By: Nolon Nations M.D.   On: 05/26/2015 12:35   Dg Shoulder Right  05/26/2015   CLINICAL DATA:  [place] against a door this morning, complaining of posterior RIGHT shoulder and arm pain, initial encounter  EXAM: RIGHT SHOULDER - 2+ VIEW  COMPARISON:  07/05/2013  FINDINGS: Osseous demineralization.  AC joint alignment normal minimal degenerative changes.  No glenohumeral fracture, dislocation, or bone destruction.  Numerous surgical clips RIGHT axilla.  Age-indeterminate fractures lateral RIGHT sixth and seventh ribs.  IMPRESSION: Osseous demineralization.  No acute shoulder abnormalities.  Age-indeterminate fractures of the RIGHT sixth and seventh ribs.   Electronically Signed   By: Lavonia [name] M.D.   On: 05/26/2015 12:23   I have personally reviewed and evaluated these images and lab results as part of my medical decision-making.   EKG Interpretation None     Meds given in ED:  Medications  fentaNYL (SUBLIMAZE) injection 50 mcg (50 mcg Intravenous Given 05/26/15 1121)  oxyCODONE-acetaminophen (PERCOCET/ROXICET) 5-325 MG per tablet 2 tablet (2 tablets Oral Given 05/26/15 1404)    Discharge Medication List as of 05/26/2015  2:31 PM     Filed Vitals:   05/26/15 1029 05/26/15 1434  BP: 235/117 168/75  Pulse: 105 104  Temp: 97.4 F (36.3 C)   TempSrc: Oral   Resp: 20 16  SpO2: 96% 100%    MDM  Vitals stable, mild tachycardia and elevated blood pressure likely secondary to pain -afebrile Pt resting comfortably in ED. PE--unremarkable cardiopulmonary exam. Tenderness diffusely throughout right axillary line. No crepitus or bony  step-offs. Imaging--chest x-ray shows acute fractures of right sixth and seventh ribs with no pneumothorax.  DDX--patient lives at home with her husband, has good support network. We'll discharge with incentive spirometry. Patient has Percocet at home for ongoing pain issues. No other evidence of acute or emergent pathology.  I discussed all relevant lab findings and imaging results with pt and they verbalized understanding. Discussed f/u with PCP within 48 hrs and return precautions, pt very amenable to plan. Prior to patient discharge, I discussed and reviewed this case with Dr. Alfonse Spruce, who also agrees with above plan.   Final diagnoses:  Rib  fractures, right, closed, initial encounter        Comer Locket, PA-C 05/26/15 1750  Harvel Quale, MD 05/26/15 2132

## 2015-05-26 NOTE — ED Notes (Signed)
Pt arrived via EMS with report of rt rib cage pain s/p to hitting a door frame. No bruising/swelling noted. No crepitus or pain to palpate.

## 2015-05-26 NOTE — Discharge Instructions (Signed)
You were evaluated in the ED today and found to have 2 broken ribs. It is important for you to use your incentive spirometer as directed. You may take her pain medicine at home as prescribed for your rib pain. Follow-up with your doctor in 1 week for reevaluation. Return to ED for new or worsening symptoms.  Rib Fracture A rib fracture is a break or crack in one of the bones of the ribs. The ribs are a group of long, curved bones that wrap around your chest and attach to your spine. They protect your lungs and other organs in the chest cavity. A broken or cracked rib is often painful, but most do not cause other problems. Most rib fractures heal on their own over time. However, rib fractures can be more serious if multiple ribs are broken or if broken ribs move out of place and push against other structures. CAUSES   A direct blow to the chest. For example, this could happen during contact sports, a car accident, or a fall against a hard object.  Repetitive movements with high force, such as pitching a baseball or having severe coughing spells. SYMPTOMS   Pain when you breathe in or cough.  Pain when someone presses on the injured area. DIAGNOSIS  Your caregiver will perform a physical exam. Various imaging tests may be ordered to confirm the diagnosis and to look for related injuries. These tests may include a chest X-ray, computed tomography (CT), magnetic resonance imaging (MRI), or a bone scan. TREATMENT  Rib fractures usually heal on their own in 1-3 months. The longer healing period is often associated with a continued cough or other aggravating activities. During the healing period, pain control is very important. Medication is usually given to control pain. Hospitalization or surgery may be needed for more severe injuries, such as those in which multiple ribs are broken or the ribs have moved out of place.  HOME CARE INSTRUCTIONS   Avoid strenuous activity and any activities or movements  that cause pain. Be careful during activities and avoid bumping the injured rib.  Gradually increase activity as directed by your caregiver.  Only take over-the-counter or prescription medications as directed by your caregiver. Do not take other medications without asking your caregiver first.  Apply ice to the injured area for the first 1-2 days after you have been treated or as directed by your caregiver. Applying ice helps to reduce inflammation and pain.  Put ice in a plastic bag.  Place a towel between your skin and the bag.   Leave the ice on for 15-20 minutes at a time, every 2 hours while you are awake.  Perform deep breathing as directed by your caregiver. This will help prevent pneumonia, which is a common complication of a broken rib. Your caregiver may instruct you to:  Take deep breaths several times a day.  Try to cough several times a day, holding a pillow against the injured area.  Use a device called an incentive spirometer to practice deep breathing several times a day.  Drink enough fluids to keep your urine clear or pale yellow. This will help you avoid constipation.   Do not wear a rib belt or binder. These restrict breathing, which can lead to pneumonia.  SEEK IMMEDIATE MEDICAL CARE IF:   You have a fever.   You have difficulty breathing or shortness of breath.   You develop a continual cough, or you cough up thick or bloody sputum.  You feel  sick to your stomach (nausea), throw up (vomit), or have abdominal pain.   You have worsening pain not controlled with medications.  MAKE SURE YOU:  Understand these instructions.  Will watch your condition.  Will get help right away if you are not doing well or get worse. Document Released: 08/09/2005 Document Revised: 04/11/2013 Document Reviewed: 10/11/2012 [facility] Patient Information 2015 [place], [place]. This information is not intended to replace advice given to you by your health care provider.  Make sure you discuss any questions you have with your health care provider.

## 2015-05-27 ENCOUNTER — Ambulatory Visit: Payer: Medicare Other | Admitting: Internal Medicine

## 2015-05-28 ENCOUNTER — Telehealth: Payer: Self-pay | Admitting: Internal Medicine

## 2015-05-28 NOTE — Telephone Encounter (Signed)
Patient has hospital fu with Dr. Linna Darner on 10/10.  Patient has two broke ribs and husband can not take care of her.  He is requesting orders to advanced home care to be placed before then.  Please follow up with patient.

## 2015-05-29 NOTE — Telephone Encounter (Signed)
Can you call and find out what kind of help they think they would benefit from. Generally with home health they can come out for physical therapy, nursing if there are bandages to change, or occupational therapy to make their environment more safe. I'm not sure what services they want.

## 2015-06-02 ENCOUNTER — Ambulatory Visit: Payer: Medicare Other | Admitting: Internal Medicine

## 2015-06-04 ENCOUNTER — Telehealth: Payer: Self-pay | Admitting: Internal Medicine

## 2015-06-04 NOTE — Telephone Encounter (Signed)
Pt's husband called requesting some pain patches for patients broken ribs. She has a ED follow up on Monday.  I tried to get her scheduled for something earlier, but husband says she not very mobile right now.  Pharmacy is [facility] on [address]

## 2015-06-05 NOTE — Telephone Encounter (Signed)
She was supposed to come in on 06/02/15 and cancelled her appointment. She needs appointment for evaluation but can use her home medications for pain (she has soma, percocet, and valium) and heat pack for pain relief.

## 2015-06-09 ENCOUNTER — Encounter: Payer: Self-pay | Admitting: Internal Medicine

## 2015-06-09 ENCOUNTER — Other Ambulatory Visit: Payer: Medicare Other

## 2015-06-09 ENCOUNTER — Ambulatory Visit (INDEPENDENT_AMBULATORY_CARE_PROVIDER_SITE_OTHER)
Admission: RE | Admit: 2015-06-09 | Discharge: 2015-06-09 | Disposition: A | Discharge status: Home / Self Care | Payer: Medicare Other | Source: Ambulatory Visit | Attending: Internal Medicine | Admitting: Internal Medicine

## 2015-06-09 ENCOUNTER — Ambulatory Visit (INDEPENDENT_AMBULATORY_CARE_PROVIDER_SITE_OTHER): Payer: Medicare Other | Admitting: Internal Medicine

## 2015-06-09 VITALS — BP 140/86 | HR 77 | Resp 18 | Wt 170.0 lb

## 2015-06-09 DIAGNOSIS — R2681 Unsteadiness on feet: Secondary | ICD-10-CM

## 2015-06-09 DIAGNOSIS — I10 Essential (primary) hypertension: Secondary | ICD-10-CM | POA: Diagnosis not present

## 2015-06-09 DIAGNOSIS — Z23 Encounter for immunization: Secondary | ICD-10-CM | POA: Diagnosis not present

## 2015-06-09 DIAGNOSIS — S2241XG Multiple fractures of ribs, right side, subsequent encounter for fracture with delayed healing: Secondary | ICD-10-CM

## 2015-06-09 DIAGNOSIS — R300 Dysuria: Secondary | ICD-10-CM

## 2015-06-09 MED ORDER — METOPROLOL TARTRATE 25 MG PO TABS: ORAL_TABLET | ORAL | Status: DC

## 2015-06-09 NOTE — Progress Notes (Signed)
Pre visit review using our clinic review tool, if applicable. No additional management support is needed unless otherwise documented below in the visit note. 

## 2015-06-09 NOTE — Addendum Note (Signed)
Addended by: Terence Lux B on: 06/09/2015 05:01 PM   Modules accepted: Orders

## 2015-06-09 NOTE — Progress Notes (Signed)
   Subjective:    Patient ID: [name], female    DOB: 29-Jul-1930, 81 y.o.   MRN: [id number]  HPI  She sustained fractures of the sixth and seventh right ribs on 05/26/15. She was carrying her dog outside ;it squirmed causing her to lose her balance and fall against a door jam.  The chest x-ray showed a 2 rib fractures but also mild perihilar pulmonary edema.  She's been on Percocet, a narcotic pain medicine, from a pain specialist, Dr.Kirstein but this has caused some dizziness. Pain has varied from 0 at rest and 9 on a 10 scale. She has limited range of motion of the right upper extremity because of the pain. This has altered her gait and she's nearly fallen fallen on 3 occasions.  She does receive neuromuscular therapy neck puncture once weekly @ [facility]; this is been some benefit.  Review of Systems  Denied were any change in heart rhythm or rate prior to the event. There was no associated chest pain or shortness of breath .  Also specifically denied prior to the episode were headache, limb weakness, tingling, or numbness. No seizure activity noted.     Objective:   Physical Exam  Pertinent or positive findings include: She is in a wheelchair; but she can get up and ambulate with help. She has minor rales over the right anterior chest.   General appearance :adequately nourished; in no distress.  Eyes: No conjunctival inflammation or scleral icterus is present.  Heart:  Normal rate and regular rhythm. S1 and S2 normal without gallop, murmur, click, rub or other extra sounds    Lungs: no wheezes, rhonchi or rubs present.No increased work of breathing.    Vascular : all pulses equal ; no bruits present.  Skin:Warm & dry.  Intact without suspicious lesions or rashes ; no tenting or jaundice   Lymphatic: No lymphadenopathy is noted about the head, neck, axilla.   Neuro: Strength, tone decreased. Gait is slow and slightly unsteady. This was most obvious  when she attempted to turn and return to the wheelchair.      Assessment & Plan:  #1 rib fractures  #2 unsteady gait . Physical Therapy assessment will be requested  Plan: See orders and recommendations

## 2015-06-09 NOTE — Patient Instructions (Signed)
  Your next office appointment will be determined based upon review of your pending  xrays  Those written interpretation of the lab results and instructions will be transmitted to you by mail for your records.  Critical results will be called.   Followup as needed for any active or acute issue. Please report any significant change in your symptoms.  The Physical Therapy referral will be scheduled and you'll be notified of the time.Please call the Referral Co-Ordinator @ [phone] if you have not been notified of appointment time within 7-10 days.

## 2015-06-10 LAB — URINE CULTURE: Colony Count: 3000

## 2015-06-12 ENCOUNTER — Telehealth: Payer: Self-pay | Admitting: Internal Medicine

## 2015-06-12 NOTE — Telephone Encounter (Signed)
Patient reports that she had a BM this am with large amount of bright red bleeding.  She has a history of hemorrhoidal bleeding.  When questioned if she has had any additional BM or bleeding she stated "Well I don't know, I put on a depends this morning and went and sat down".  She is advised that she should proceed to the ER for evaluation if she is passing blood independent of stool, passing clots, large amount of bleeding, light headed, dizzy, CP or SOB.  She should remain on Benefiber as recommended if she is just seeing this bleeding with BM.  She will call next week with an update.

## 2015-07-08 ENCOUNTER — Telehealth: Payer: Self-pay | Admitting: Internal Medicine

## 2015-07-08 NOTE — Telephone Encounter (Signed)
Is requesting call back in regards to lab work in September.  She is requesting more information.

## 2015-07-09 ENCOUNTER — Encounter: Payer: Self-pay | Admitting: Internal Medicine

## 2015-07-30 ENCOUNTER — Ambulatory Visit: Payer: Medicare Other | Admitting: Internal Medicine

## 2015-10-20 ENCOUNTER — Other Ambulatory Visit: Payer: Self-pay

## 2015-10-20 MED ORDER — CELECOXIB 200 MG PO CAPS: 200.0000 mg | ORAL_CAPSULE | Freq: Every day | ORAL | Status: DC

## 2015-11-25 ENCOUNTER — Encounter: Payer: Medicare Other | Admitting: Internal Medicine

## 2015-11-25 ENCOUNTER — Ambulatory Visit: Payer: Medicare Other | Admitting: Internal Medicine

## 2015-12-15 ENCOUNTER — Telehealth: Payer: Self-pay | Admitting: Internal Medicine

## 2015-12-15 NOTE — Telephone Encounter (Signed)
Patient got charged a now show fee from 4/4.  Patients appt should have been cancelled due to provider not being in office. Can you please waive this fee.

## 2015-12-16 NOTE — Telephone Encounter (Signed)
The NS fee is from 07/30/15. This appt was confirmed on 12/5 via [employer]. Please let the patient know that we will waive fee as a courtesy.

## 2015-12-18 NOTE — Telephone Encounter (Signed)
Notified patient.  Patient states she has been having memory issues. States she is seeing MD's in regards to this.

## 2016-01-01 ENCOUNTER — Telehealth: Payer: Self-pay | Admitting: *Deleted

## 2016-01-01 NOTE — Telephone Encounter (Signed)
Patient requesting chart notes from March 2008 with Dr. Letta Pate. She cites legal issues as the reason

## 2016-01-16 ENCOUNTER — Encounter: Payer: Self-pay | Admitting: Oncology

## 2016-01-16 ENCOUNTER — Telehealth: Payer: Self-pay | Admitting: Oncology

## 2016-01-16 ENCOUNTER — Telehealth: Payer: Self-pay | Admitting: *Deleted

## 2016-01-16 NOTE — Telephone Encounter (Signed)
Received appt date/time from Seth Bake in [facility].  Pt is a transfer pt so I did not mail out a new pt packet.  I placed a note for an intake form to be given to the patient at time of check in.

## 2016-01-16 NOTE — Telephone Encounter (Signed)
Crystal from Dr. Jones Broom office called regarding an appointment for [name]. I told her that I spoke to the patient on 5/23 and at the time patient seemed confused as to why I was scheduling an appointment for her. I also explained to [place] that the patient told me that she was more concerned about having a bone scan than cancer. I scheduled an appointment with Dr. Jana Hakim on 6/7 @4 :30PM. Crystal will contact the patient regarding her appointment.

## 2016-01-27 ENCOUNTER — Other Ambulatory Visit: Payer: Self-pay | Admitting: *Deleted

## 2016-01-27 DIAGNOSIS — Z853 Personal history of malignant neoplasm of breast: Secondary | ICD-10-CM

## 2016-01-28 ENCOUNTER — Encounter: Payer: Self-pay | Admitting: Oncology

## 2016-01-28 ENCOUNTER — Encounter: Payer: Medicare Other | Admitting: Oncology

## 2016-01-28 ENCOUNTER — Other Ambulatory Visit: Payer: Medicare Other

## 2016-03-19 ENCOUNTER — Telehealth: Payer: Self-pay | Admitting: Internal Medicine

## 2016-03-19 NOTE — Telephone Encounter (Signed)
Patient state when she established with Dr. Sharlet Salina she was not comfortable with her and decided not to go back.  She states she she has received a no show fee.  Not sure of the date.  It looks like she has a no show on 4/4 and 12/7.  Patient is requesting fee to be waived.

## 2016-03-23 NOTE — Telephone Encounter (Signed)
Please inform patient that I have emailed billing to void the No Show fee for [facility] 11/25/15 as a one time courtesy.  NOTE: 11/25/15 clinic was cancelled by the provider.

## 2016-03-24 NOTE — Telephone Encounter (Signed)
Informed patient

## 2016-05-17 ENCOUNTER — Telehealth: Payer: Self-pay | Admitting: Internal Medicine

## 2016-05-17 NOTE — Telephone Encounter (Signed)
Patient is scheduled for an appt at 8:30 on 9/27

## 2016-05-19 ENCOUNTER — Ambulatory Visit (INDEPENDENT_AMBULATORY_CARE_PROVIDER_SITE_OTHER): Payer: Medicare Other | Admitting: Internal Medicine

## 2016-05-19 ENCOUNTER — Encounter: Payer: Self-pay | Admitting: Internal Medicine

## 2016-05-19 DIAGNOSIS — K648 Other hemorrhoids: Secondary | ICD-10-CM | POA: Diagnosis not present

## 2016-05-19 NOTE — Assessment & Plan Note (Signed)
Symptomatic again Banded RP internal pile RTC prn Stay on Benefiber

## 2016-05-19 NOTE — Patient Instructions (Signed)
HEMORRHOID BANDING PROCEDURE    FOLLOW-UP CARE   1. The procedure you have had should have been relatively painless since the banding of the area involved does not have nerve endings and there is no pain sensation.  The rubber band cuts off the blood supply to the hemorrhoid and the band may fall off as soon as 48 hours after the banding (the band may occasionally be seen in the toilet bowl following a bowel movement). You may notice a temporary feeling of fullness in the rectum which should respond adequately to plain Tylenol or Motrin.  2. Following the banding, avoid strenuous exercise that evening and resume full activity the next day.  A sitz bath (soaking in a warm tub) or bidet is soothing, and can be useful for cleansing the area after bowel movements.     3. To avoid constipation, take two tablespoons of natural wheat bran, natural oat bran, flax, Benefiber or any over the counter fiber supplement and increase your water intake to 7-8 glasses daily.    4. Unless you have been prescribed anorectal medication, do not put anything inside your rectum for two weeks: No suppositories, enemas, fingers, etc.  5. Occasionally, you may have more bleeding than usual after the banding procedure.  This is often from the untreated hemorrhoids rather than the treated one.  Don't be concerned if there is a tablespoon or so of blood.  If there is more blood than this, lie flat with your bottom higher than your head and apply an ice pack to the area. If the bleeding does not stop within a half an hour or if you feel faint, call our office at [phone] or go to the emergency room.  6. Problems are not common; however, if there is a substantial amount of bleeding, severe pain, chills, fever or difficulty passing urine (very rare) or other problems, you should call us at [phone] or report to the nearest emergency room.  7. Do not stay seated continuously for more than 2-3 hours for a day or two  after the procedure.  Tighten your buttock muscles 10-15 times every two hours and take 10-15 deep breaths every 1-2 hours.  Do not spend more than a few minutes on the toilet if you cannot empty your bowel; instead re-visit the toilet at a later time.    We will see you back as needed.      I appreciate the opportunity to care for you. Silvano Rusk, MD, [facility]

## 2016-05-19 NOTE — Progress Notes (Signed)
   [name] 82 y.o. July 03, 1930 [id number]  Assessment & Plan:  Internal hemorrhoids with bleeding and prolapse Symptomatic again Banded RP internal pile RTC prn Stay on Benefiber  Subjective:   Chief Complaint: rectal bleeding  HPI  Had a BM with severe bleeding x 15 minutes 2 mos ago A little bleeding with spots since "not pouring" I think its my hemorrhoids again   Related a story about when se was young she ate mash that father had made for the still and then used to drink whiskey from his still in [facility] - thinks that is why she has lived so long  Medications, allergies, past medical history, past surgical history, family history and social history are reviewed and updated in the EMR.  Review of Systems As above, dizzy when standing, some memory loss  Objective:   Physical Exam BP 138/76 (BP Location: Left Arm, Patient Position: Sitting, Cuff Size: Normal)   Pulse 84   Ht 5' 3.75" (1.619 m)   Wt 158 lb 3.2 oz (71.8 kg)   BMI 27.37 kg/m   Patti Martinique, CMA present.  Rectal shows fleshy anal tags all 3 positions and a grade 3 RP internal/external hemorrhoid   Anoscopy Gr 3 RP and Gr 1-2 RA and LL internal hemorrhoids  PROCEDURE NOTE: The patient presents with symptomatic grade 3 hemorrhoids, requesting rubber band ligation of his/her hemorrhoidal disease.  All risks, benefits and alternative forms of therapy were described and informed consent was obtained.    The decision was made to band the RP internal hemorrhoid, and the Silt was used to perform band ligation without complication.  Digital anorectal examination was then performed to assure proper positioning of the band, and to adjust the banded tissue as required.  The patient was discharged home without pain or other issues.  Dietary and behavioral recommendations were given and along with follow-up instructions.     The following adjunctive treatments were  recommended:  Continue benefiber  The patient will return as needed for  follow-up and possible additional banding as required. No complications were encountered and the patient tolerated the procedure well.

## 2016-07-20 ENCOUNTER — Ambulatory Visit: Payer: Medicare Other | Admitting: Internal Medicine

## 2016-07-27 ENCOUNTER — Encounter: Payer: Self-pay | Admitting: Neurology

## 2016-07-27 ENCOUNTER — Ambulatory Visit (INDEPENDENT_AMBULATORY_CARE_PROVIDER_SITE_OTHER): Payer: Medicare Other | Admitting: Neurology

## 2016-07-27 VITALS — BP 120/76 | HR 116 | Ht 61.0 in | Wt 161.0 lb

## 2016-07-27 DIAGNOSIS — R42 Dizziness and giddiness: Secondary | ICD-10-CM | POA: Diagnosis not present

## 2016-07-27 NOTE — Progress Notes (Signed)
NEUROLOGY CONSULTATION NOTE  [name] MRN: [id number] DOB: [birth year]  Referring provider: Dr. Delfina Redwood Primary care provider: Dr. Delfina Redwood  Reason for consult:  Dizziness  HISTORY OF PRESENT ILLNESS: [name] is an 82 year old woman with PTSD, depression, chronic pain, IBS, MGUS, osteoporosis and history of breast cancer who presents for dizziness.    She has experienced these dizzy spells for many years.  It occurs frequently.  It occurs in the morning when she wakes up.  It occurs following an intense dream.  When she wakes up laying in bed, her head "does not feel normal".  It is difficult for her to elaborate.  It is not a sensation of the room spinning.  There is no associated headache, double vision, slurred speech, hearing change or focal numbness or weakness.  When she gets up, she feels off-balance.  It usually resolves once she is able to sit down and relax.   She has history of chronic pain, depression and anxiety.   She takes Manufacturing engineer, Valium, Vistaril, Percocet and Wellbutrin.  PAST MEDICAL HISTORY: Past Medical History:  Diagnosis Date  . Adenomatous colon polyp 04/2005  . Allergic    Anectine  . ANXIETY 10/13/2007  . BACK PAIN 03/01/2007  . BREAST CANCER, HX OF 12/06/2007  . Cancer [facility])    RIGHT mastectomy with node dissection  . Complication of anesthesia    family aggergy to annectine  . Compression fracture of L4 lumbar vertebra (HCC) 02/2013  . CONSTIPATION 01/06/2009  . DEPRESSION 10/13/2007  . Diverticular disease   . DIVERTICULOSIS, COLON 05/11/2005  . External hemorrhoids without mention of complication [id number]  . FATIGUE 08/07/2008  . HEMORRHOIDS, INTERNAL 05/11/2005  . Hypercalcemia 04/23/2009  . HYPERLIPIDEMIA NEC/NOS 03/01/2007  . HYPERTENSION 10/13/2007  . Impaired fasting glucose 11/14/2008  . INTERSTITIAL CYSTITIS 09/06/2007  . Irritable bowel syndrome 01/06/2009  . LIVER MASS 05/15/2009  . OSTEOPOROSIS 05/09/2009  . PARESTHESIA  09/06/2007  . POLYCYTHEMIA 04/23/2009  . STENOSIS, SPINAL, UNSPC REGION 03/01/2007  . Thyroid disease     PAST SURGICAL HISTORY: Past Surgical History:  Procedure Laterality Date  . ABDOMINAL HYSTERECTOMY    . BACK SURGERY    . BREAST SURGERY     mastectomy -right  . CERVICAL SPINE SURGERY    . CHOLECYSTECTOMY    . COLONOSCOPY W/ BIOPSIES    . cortisone injection  10/16/12   left knee  . FIXATION KYPHOPLASTY LUMBAR SPINE    . HEMORRHOID BANDING  2015-16  . PARATHYROID EXPLORATION  06/29/2011   Procedure: PARATHYROID EXPLORATION;  Surgeon: Earnstine Regal, MD;  Location: [facility];  Service: General;  Laterality: Left;  Left Superior Parathyroidectomy  . PARATHYROIDECTOMY  06/29/11    MEDICATIONS: Current Outpatient Prescriptions on File Prior to Visit  Medication Sig Dispense Refill  . acetaminophen (TYLENOL) 500 MG tablet Take 1,000 mg by mouth every 6 (six) hours as needed for mild pain.    . Attapulgite (KAOPECTATE PO) Take 5 mLs by mouth daily as needed (for diarrhea).     Marland Kitchen buPROPion (WELLBUTRIN XL) 300 MG 24 hr tablet Take 300 mg by mouth daily.    . calcium carbonate (OS-CAL) 600 MG TABS Take 600 mg by mouth daily.    . carisoprodol (SOMA) 250 MG tablet Take 1 tablet (250 mg total) by mouth daily as needed (Pt is also on the 350 mg Soma.). For muscle spasms. 90 tablet 0  . carisoprodol (SOMA) 350 MG tablet Take 1 tablet (  350 mg total) by mouth at bedtime as needed for muscle spasms (Pt is also taking 250 mg Soma.). 90 tablet 0  . celecoxib (CELEBREX) 200 MG capsule Take 1 capsule (200 mg total) by mouth daily. 90 capsule 3  . Cholecalciferol (VITAMIN D3) 1000 UNITS CAPS Take 1,000 Units by mouth.    . diazepam (VALIUM) 5 MG tablet Take 10 mg by mouth 2 (two) times daily as needed for anxiety. BRAND NAMES ONLY.    Marland Kitchen estradiol (ESTRACE) 0.1 MG/GM vaginal cream Place 2 g vaginally daily as needed (for dryness).     Marland Kitchen estrogen, conjugated,-medroxyprogesterone (PREMPRO) 0.3-1.5 MG per  tablet Take 1 tablet by mouth every evening.    . fluticasone (FLONASE) 50 MCG/ACT nasal spray Place 2 sprays into both nostrils daily. (Patient taking differently: Place 2 sprays into both nostrils as needed for allergies. ) 16 g 2  . hydrocortisone (ANUSOL-HC) 25 MG suppository Place 1 suppository (25 mg total) rectally 2 (two) times daily. (Patient taking differently: Place 25 mg rectally as needed for itching (IBS symptoms). ) 24 suppository 0  . metoprolol tartrate (LOPRESSOR) 25 MG tablet Branded Lopressor 25 mg bid requested by patient 60 tablet 2  . metroNIDAZOLE (METROGEL) 1 % gel Apply 1 application topically daily.     Marland Kitchen OVER THE COUNTER MEDICATION Take 1 tablet by mouth every other day. Perfect Prenatal Multivitamin. She alternates with taking a Multivitamin tablet.    Marland Kitchen PERCOCET 10-325 MG per tablet Take 0.5-1 tablets by mouth every 8 (eight) hours as needed for pain.     Marland Kitchen VISTARIL 25 MG capsule Take 25 mg by mouth at bedtime.    . Wheat Dextrin (BENEFIBER) POWD Take by mouth. Take one tablespoon daily.    . Zoledronic Acid (RECLAST IV) Inject into the vein. Once a year.     No current facility-administered medications on file prior to visit.     ALLERGIES: Allergies  Allergen Reactions  . Other Other (See Comments)    Other reaction(s): Unknown Can't remember reaction PATIENT STATED SHE SLEPT FOR 3 DAYS AND 3 NIGHTS PATIENT STATES THAT SHE CAN NOT TAKE GENERIC MEDICATIONS. She also can not take anti-depressants.  Jules Schick [Succinylcholine Chloride] Other (See Comments)    Reaction:  Patient states that her body wasn't functioning.  Pt states she was put on life support.   . Decongest     Elevated blood pressure, "Doesn't work"  . Dilaudid [Hydromorphone Hcl]     Makes sleepy but it works for her  . Hydrocodone-Acetaminophen     REACTION: Hallucinations, paranoia, amnesia  . Iodine     REACTION: Hives, asthma  . Naproxen Sodium     REACTION: Asthma  . Oxycodone Hcl      REACTION: Hallucinations, paranoia, amnesia  . Pentosan Polysulfate Sodium     REACTION: unspecified per patient  . Pseudoephedrine     REACTION: Palpitations  . Succinylcholine     REACTION: "Can't wake up".  . Tape Dermatitis    Red inflammed  . Tramadol Hcl     Pt allergic to ANY GENERIC Rx  . Vioxx [Rofecoxib]     "Doesn't work for pain"  . Shellfish Allergy     Runny nose, cough, hoarseness    FAMILY HISTORY: Family History  Problem Relation Age of Onset  . Heart disease Sister   . Hypertension Sister   . Asthma Sister   . Breast cancer Maternal Aunt   . Ovarian cancer Paternal Aunt   .  Colon cancer Neg Hx   . Esophageal cancer Neg Hx   . Pancreatic cancer Neg Hx   . Rectal cancer Neg Hx   . Stomach cancer Neg Hx     SOCIAL HISTORY: Social History   Social History  . Marital status: Married    Spouse name: N/A  . Number of children: N/A  . Years of education: N/A   Occupational History  . Not on file.   Social History Main Topics  . Smoking status: Former Smoker    Packs/day: 1.00    Years: 42.00    Types: Cigarettes    Quit date: 08/23/1977  . Smokeless tobacco: Never Used  . Alcohol use No  . Drug use: No  . Sexual activity: Not on file   Other Topics Concern  . Not on file   Social History Narrative  . No narrative on file    REVIEW OF SYSTEMS: Constitutional: No fevers, chills, or sweats, no generalized fatigue, change in appetite Eyes: No visual changes, double vision, eye pain Ear, nose and throat: No hearing loss, ear pain, nasal congestion, sore throat Cardiovascular: No chest pain, palpitations Respiratory:  No shortness of breath at rest or with exertion, wheezes GastrointestinaI: No nausea, vomiting, diarrhea, abdominal pain, fecal incontinence Genitourinary:  No dysuria, urinary retention or frequency Musculoskeletal:  Generalized pain Integumentary: No rash, pruritus, skin lesions Neurological: as above Psychiatric:  depression, anxiety Endocrine: No palpitations, fatigue, diaphoresis, mood swings, change in appetite, change in weight, increased thirst Hematologic/Lymphatic:  No purpura, petechiae. Allergic/Immunologic: no itchy/runny eyes, nasal congestion, recent allergic reactions, rashes  PHYSICAL EXAM: Vitals:   07/27/16 1508  BP: 120/76  Pulse: (!) 116   General: No acute distress.  Patient appears well-groomed.  Head:  Normocephalic/atraumatic Eyes:  fundi examined but not visualized Neck: supple, no paraspinal tenderness, full range of motion Back: No paraspinal tenderness Heart: regular rate and rhythm Lungs: Clear to auscultation bilaterally. Vascular: No carotid bruits. Neurological Exam: Mental status: alert and oriented to person, place, and time, recent and remote memory intact, fund of knowledge intact, attention and concentration intact, speech fluent and not dysarthric, language intact. Cranial nerves: CN I: not tested CN II: pupils equal, round and reactive to light, visual fields intact CN III, IV, VI:  full range of motion, no nystagmus, no ptosis CN V: facial sensation intact CN VII: upper and lower face symmetric CN VIII: hearing intact CN IX, X: gag intact, uvula midline CN XI: sternocleidomastoid and trapezius muscles intact CN XII: tongue midline Bulk & Tone: normal, no fasciculations. Motor:  5/5 throughout  Sensation: temperature and vibration sensation intact. Deep Tendon Reflexes:  2+ throughout, toes downgoing.  Finger to nose testing:  Without dysmetria.  Heel to shin:  Without dysmetria.  Gait:  Cautious gait.  Uses walker due to pain.  Romberg negative.  IMPRESSION: 1. Dizziness, unspecified.  I [name] not feel it intracranial or otherwise neurologic.  Symptoms are non-focal and have occurred for many years.  It may be stress-related or due to medication side effects.  No further neurologic workup warranted.  45 minutes spent face to face with patient, over  50% spent discussing my impression and reviewing differential diagnoses.  Thank you for allowing me to take part in the care of this patient.  Selena Clines, [name]  CC:  Seward Carol, MD

## 2016-07-27 NOTE — Patient Instructions (Signed)
I don't think the dizziness is neurologic.  Your exam looks okay

## 2016-09-15 ENCOUNTER — Telehealth: Payer: Self-pay | Admitting: Internal Medicine

## 2016-09-15 NOTE — Telephone Encounter (Signed)
Patient reports that she is being treated for a vaginal yeast infection and thrush.  She reports that her GYN recommended a visit with GI to discuss "intestinal yeast"  She will come in tomorrow and see Dr. Carlean Purl on 09/16/16 8:30

## 2016-09-16 ENCOUNTER — Ambulatory Visit (INDEPENDENT_AMBULATORY_CARE_PROVIDER_SITE_OTHER): Payer: Medicare Other | Admitting: Internal Medicine

## 2016-09-16 ENCOUNTER — Encounter: Payer: Self-pay | Admitting: Internal Medicine

## 2016-09-16 VITALS — BP 150/80 | HR 104 | Ht 63.75 in | Wt 158.5 lb

## 2016-09-16 DIAGNOSIS — B373 Candidiasis of vulva and vagina: Secondary | ICD-10-CM | POA: Diagnosis not present

## 2016-09-16 DIAGNOSIS — K644 Residual hemorrhoidal skin tags: Secondary | ICD-10-CM | POA: Diagnosis not present

## 2016-09-16 DIAGNOSIS — B3731 Acute candidiasis of vulva and vagina: Secondary | ICD-10-CM

## 2016-09-16 DIAGNOSIS — A09 Infectious gastroenteritis and colitis, unspecified: Secondary | ICD-10-CM

## 2016-09-16 DIAGNOSIS — K581 Irritable bowel syndrome with constipation: Secondary | ICD-10-CM | POA: Diagnosis not present

## 2016-09-16 DIAGNOSIS — K529 Noninfective gastroenteritis and colitis, unspecified: Secondary | ICD-10-CM

## 2016-09-16 NOTE — Progress Notes (Signed)
[name] 83 y.o. 07/23/1930 [id number]  Assessment & Plan:   Encounter Diagnoses  Name Primary?  . Irritable bowel syndrome with constipation Yes  . Gastroenteritis presumed infectious   . Vulvovaginal candidiasis   . Anal skin tags    She is concerned that she has yeast overgrowth in intestines and needs treatment. I told her best I could do for that was probiotic and that I am not a believer in that being a problem. Probiotics after a gastroenteritis very reasonable.  Probiotic VSL#3 DS x 1 month possibly longer pending results  She does not want to drastically alter diet as done in past to "readicate yeast in gut" - her choice as I would not recommend that anyway  Benefiber qd - will promote regular defecation and ease hemorrhoid sxs  RTC prn   [id number] D, MD Dian Queen, MD  Subjective:   Chief Complaint: yeast in GI tract  HPI Terrible diarrheal illness recently - self-limited but lasted days Then sore tongue and throoat and vaginal candidiasis - dx and Tx by Dr. Helane Rima  Has had intestinal yeast in past and thinks again - saw MD who Rxed diet and nonths-years of probiotics Prefers not to change diets again  Now constipated and hemorrhoids are bothering her - "I can feel them" Not taking Benefiber as recommended  Current Meds  Medication Sig  . acetaminophen (TYLENOL) 500 MG tablet Take 1,000 mg by mouth every 6 (six) hours as needed for mild pain.  . Attapulgite (KAOPECTATE PO) Take 5 mLs by mouth daily as needed (for diarrhea).   Marland Kitchen buPROPion (WELLBUTRIN XL) 300 MG 24 hr tablet Take 300 mg by mouth daily.  . calcium carbonate (OS-CAL) 600 MG TABS Take 600 mg by mouth daily.  . carisoprodol (SOMA) 250 MG tablet Take 1 tablet (250 mg total) by mouth daily as needed (Pt is also on the 350 mg Soma.). For muscle spasms.  . celecoxib (CELEBREX) 200 MG capsule Take 1 capsule (200 mg total) by mouth daily.  . Cholecalciferol (VITAMIN D3) 1000  UNITS CAPS Take 1,000 Units by mouth.  . estradiol (ESTRACE) 0.1 MG/GM vaginal cream Place 2 g vaginally daily as needed (for dryness).   Marland Kitchen estrogen, conjugated,-medroxyprogesterone (PREMPRO) 0.3-1.5 MG per tablet Take 1 tablet by mouth every evening.  . fluticasone (FLONASE) 50 MCG/ACT nasal spray Place 2 sprays into both nostrils daily. (Patient taking differently: Place 2 sprays into both nostrils as needed for allergies. )  . hydrocortisone (ANUSOL-HC) 25 MG suppository Place 1 suppository (25 mg total) rectally 2 (two) times daily. (Patient taking differently: Place 25 mg rectally as needed for itching (IBS symptoms). )  . OVER THE COUNTER MEDICATION Take 1 tablet by mouth every other day. Perfect Prenatal Multivitamin. She alternates with taking a Multivitamin tablet.  Marland Kitchen PERCOCET 10-325 MG per tablet Take 0.5-1 tablets by mouth as needed for pain.   . Probiotic Product (VSL#3 DS PO) Take 1 packet by mouth daily. Take for a month  . VISTARIL 25 MG capsule Take 25 mg by mouth at bedtime.  . Wheat Dextrin (BENEFIBER) POWD Take by mouth. Take one tablespoon daily.  . Zoledronic Acid (RECLAST IV) Inject into the vein. Once a year.  . [DISCONTINUED] diazepam (VALIUM) 5 MG tablet Take 10 mg by mouth 2 (two) times daily as needed for anxiety. BRAND NAMES ONLY.   Allergies  Allergen Reactions  . Other Other (See Comments)    Other reaction(s): Unknown Can't remember reaction PATIENT  STATED SHE SLEPT FOR 3 DAYS AND 3 NIGHTS PATIENT STATES THAT SHE CAN NOT TAKE GENERIC MEDICATIONS. She also can not take anti-depressants.  Jules Schick [Succinylcholine Chloride] Other (See Comments)    Reaction:  Patient states that her body wasn't functioning.  Pt states she was put on life support.   . Decongest     Elevated blood pressure, "Doesn't work"  . Dilaudid [Hydromorphone Hcl]     Makes sleepy but it works for her  . Hydrocodone-Acetaminophen     REACTION: Hallucinations, paranoia, amnesia  . Iodine      REACTION: Hives, asthma  . Naproxen Sodium     REACTION: Asthma  . Oxycodone Hcl     REACTION: Hallucinations, paranoia, amnesia  . Pentosan Polysulfate Sodium     REACTION: unspecified per patient  . Pseudoephedrine     REACTION: Palpitations  . Succinylcholine     REACTION: "Can't wake up".  . Tape Dermatitis    Red inflammed  . Tramadol Hcl     Pt allergic to ANY GENERIC Rx  . Vioxx [Rofecoxib]     "Doesn't work for pain"  . Shellfish Allergy     Runny nose, cough, hoarseness   Past Medical History:  Diagnosis Date  . Adenomatous colon polyp 04/2005  . Allergic    Anectine  . ANXIETY 10/13/2007  . BACK PAIN 03/01/2007  . BREAST CANCER, HX OF 12/06/2007  . Cancer [facility])    RIGHT mastectomy with node dissection  . Complication of anesthesia    family aggergy to annectine  . Compression fracture of L4 lumbar vertebra (HCC) 02/2013  . CONSTIPATION 01/06/2009  . DEPRESSION 10/13/2007  . Diverticular disease   . DIVERTICULOSIS, COLON 05/11/2005  . External hemorrhoids without mention of complication [id number]  . FATIGUE 08/07/2008  . HEMORRHOIDS, INTERNAL 05/11/2005  . Hypercalcemia 04/23/2009  . HYPERLIPIDEMIA NEC/NOS 03/01/2007  . HYPERTENSION 10/13/2007  . Impaired fasting glucose 11/14/2008  . INTERSTITIAL CYSTITIS 09/06/2007  . Irritable bowel syndrome 01/06/2009  . LIVER MASS 05/15/2009  . OSTEOPOROSIS 05/09/2009  . PARESTHESIA 09/06/2007  . POLYCYTHEMIA 04/23/2009  . STENOSIS, SPINAL, UNSPC REGION 03/01/2007  . Thyroid disease    Past Surgical History:  Procedure Laterality Date  . ABDOMINAL HYSTERECTOMY    . BACK SURGERY    . BREAST SURGERY     mastectomy -right  . CERVICAL SPINE SURGERY    . CHOLECYSTECTOMY    . COLONOSCOPY W/ BIOPSIES    . cortisone injection  10/16/12   left knee  . FIXATION KYPHOPLASTY LUMBAR SPINE    . HEMORRHOID BANDING  2015-16  . PARATHYROID EXPLORATION  06/29/2011   Procedure: PARATHYROID EXPLORATION;  Surgeon: Earnstine Regal, MD;  Location:  [facility];  Service: General;  Laterality: Left;  Left Superior Parathyroidectomy  . PARATHYROIDECTOMY  06/29/11   Social History   Social History  . Marital status: Married    Spouse name: N/A  . Number of children: 0  . Years of education: N/A   Social History Main Topics  . Smoking status: Former Smoker    Packs/day: 1.00    Years: 42.00    Types: Cigarettes    Quit date: 08/23/1977  . Smokeless tobacco: Never Used  . Alcohol use No  . Drug use: No  . Sexual activity: Not Asked   Other Topics Concern  . None   Social History Narrative  . None   family history includes Asthma in her sister; Breast cancer in her maternal aunt;  Heart disease in her sister; Hypertension in her sister; Ovarian cancer in her paternal aunt.   Review of Systems As above Husband taken away by his son to another state  Objective:   Physical Exam BP (!) 150/80   Pulse (!) 104   Ht 5' 3.75" (1.619 m)   Wt 158 lb 8 oz (71.9 kg)   BMI 27.42 kg/m  Eyes anicteric Tongue, oral and post pharynx look NL Alert and oriented Appropriate  Patti Martinique, CMA present.  Rectal - small fleshy tags no rash NL DRE   Reviewed recent GI visits.

## 2016-09-16 NOTE — Patient Instructions (Signed)
   Today we are giving you samples of VSL #3 DS, take one packet daily for a month.    Continue your benefiber daily.     I appreciate the opportunity to care for you. Silvano Rusk, MD, [facility]

## 2016-12-16 ENCOUNTER — Observation Stay (HOSPITAL_COMMUNITY)
Admission: EM | Admit: 2016-12-16 | Discharge: 2016-12-17 | Disposition: A | Discharge status: Home Health | Payer: Medicare Other | Attending: Nephrology | Admitting: Nephrology

## 2016-12-16 ENCOUNTER — Observation Stay (HOSPITAL_COMMUNITY): Payer: Medicare Other

## 2016-12-16 ENCOUNTER — Emergency Department (HOSPITAL_COMMUNITY): Payer: Medicare Other

## 2016-12-16 ENCOUNTER — Encounter (HOSPITAL_COMMUNITY): Payer: Self-pay | Admitting: Nurse Practitioner

## 2016-12-16 DIAGNOSIS — R531 Weakness: Secondary | ICD-10-CM | POA: Insufficient documentation

## 2016-12-16 DIAGNOSIS — R471 Dysarthria and anarthria: Secondary | ICD-10-CM | POA: Diagnosis not present

## 2016-12-16 DIAGNOSIS — Z79899 Other long term (current) drug therapy: Secondary | ICD-10-CM | POA: Diagnosis not present

## 2016-12-16 DIAGNOSIS — Z87891 Personal history of nicotine dependence: Secondary | ICD-10-CM | POA: Insufficient documentation

## 2016-12-16 DIAGNOSIS — Z885 Allergy status to narcotic agent status: Secondary | ICD-10-CM | POA: Diagnosis not present

## 2016-12-16 DIAGNOSIS — M545 Low back pain: Secondary | ICD-10-CM | POA: Insufficient documentation

## 2016-12-16 DIAGNOSIS — R29898 Other symptoms and signs involving the musculoskeletal system: Secondary | ICD-10-CM | POA: Diagnosis not present

## 2016-12-16 DIAGNOSIS — Z853 Personal history of malignant neoplasm of breast: Secondary | ICD-10-CM | POA: Insufficient documentation

## 2016-12-16 DIAGNOSIS — G8929 Other chronic pain: Secondary | ICD-10-CM | POA: Diagnosis not present

## 2016-12-16 DIAGNOSIS — Z91013 Allergy to seafood: Secondary | ICD-10-CM | POA: Diagnosis not present

## 2016-12-16 DIAGNOSIS — R4781 Slurred speech: Secondary | ICD-10-CM | POA: Diagnosis present

## 2016-12-16 DIAGNOSIS — R9431 Abnormal electrocardiogram [ECG] [EKG]: Secondary | ICD-10-CM | POA: Diagnosis not present

## 2016-12-16 DIAGNOSIS — F419 Anxiety disorder, unspecified: Secondary | ICD-10-CM | POA: Insufficient documentation

## 2016-12-16 DIAGNOSIS — F411 Generalized anxiety disorder: Secondary | ICD-10-CM | POA: Diagnosis not present

## 2016-12-16 DIAGNOSIS — Z803 Family history of malignant neoplasm of breast: Secondary | ICD-10-CM | POA: Diagnosis not present

## 2016-12-16 DIAGNOSIS — I10 Essential (primary) hypertension: Secondary | ICD-10-CM

## 2016-12-16 DIAGNOSIS — E785 Hyperlipidemia, unspecified: Secondary | ICD-10-CM | POA: Diagnosis not present

## 2016-12-16 DIAGNOSIS — I639 Cerebral infarction, unspecified: Secondary | ICD-10-CM

## 2016-12-16 DIAGNOSIS — F329 Major depressive disorder, single episode, unspecified: Secondary | ICD-10-CM | POA: Insufficient documentation

## 2016-12-16 DIAGNOSIS — Z888 Allergy status to other drugs, medicaments and biological substances status: Secondary | ICD-10-CM | POA: Insufficient documentation

## 2016-12-16 DIAGNOSIS — Z91048 Other nonmedicinal substance allergy status: Secondary | ICD-10-CM | POA: Diagnosis not present

## 2016-12-16 DIAGNOSIS — G459 Transient cerebral ischemic attack, unspecified: Principal | ICD-10-CM

## 2016-12-16 LAB — DIFFERENTIAL
BASOS PCT: 0 %
Basophils Absolute: 0 10*3/uL (ref 0.0–0.1)
Eosinophils Absolute: 0.2 10*3/uL (ref 0.0–0.7)
Eosinophils Relative: 2 %
LYMPHS ABS: 3.8 10*3/uL (ref 0.7–4.0)
LYMPHS PCT: 44 %
MONOS PCT: 5 %
Monocytes Absolute: 0.5 10*3/uL (ref 0.1–1.0)
Neutro Abs: 4.1 10*3/uL (ref 1.7–7.7)
Neutrophils Relative %: 49 %

## 2016-12-16 LAB — COMPREHENSIVE METABOLIC PANEL
ALBUMIN: 3.9 g/dL (ref 3.5–5.0)
ALK PHOS: 73 U/L (ref 38–126)
ALT: 16 U/L (ref 14–54)
ANION GAP: 9 (ref 5–15)
AST: 21 U/L (ref 15–41)
BUN: 14 mg/dL (ref 6–20)
CALCIUM: 9 mg/dL (ref 8.9–10.3)
CHLORIDE: 105 mmol/L (ref 101–111)
CO2: 25 mmol/L (ref 22–32)
CREATININE: 0.94 mg/dL (ref 0.44–1.00)
GFR calc Af Amer: >60 mL/min (ref >60)
GFR calc non Af Amer: 54 mL/min — ABNORMAL LOW (ref >60)
Glucose, Bld: 121 mg/dL — ABNORMAL HIGH (ref 65–99)
Potassium: 4 mmol/L (ref 3.5–5.1)
SODIUM: 139 mmol/L (ref 135–145)
Total Bilirubin: 0.3 mg/dL (ref 0.3–1.2)
Total Protein: 6.1 g/dL — ABNORMAL LOW (ref 6.5–8.1)

## 2016-12-16 LAB — I-STAT CHEM 8, ED
BUN: 20 mg/dL (ref 6–20)
CALCIUM ION: 1.09 mmol/L — AB (ref 1.15–1.40)
Chloride: 103 mmol/L (ref 101–111)
Creatinine, Ser: 0.9 mg/dL (ref 0.44–1.00)
Glucose, Bld: 122 mg/dL — ABNORMAL HIGH (ref 65–99)
HEMATOCRIT: 44 % (ref 36.0–46.0)
Hemoglobin: 15 g/dL (ref 12.0–15.0)
Potassium: 4.1 mmol/L (ref 3.5–5.1)
SODIUM: 138 mmol/L (ref 135–145)
TCO2: 30 mmol/L (ref 0–100)

## 2016-12-16 LAB — CBC
HCT: 45 % (ref 36.0–46.0)
Hemoglobin: 14.8 g/dL (ref 12.0–15.0)
MCH: 29.6 pg (ref 26.0–34.0)
MCHC: 32.9 g/dL (ref 30.0–36.0)
MCV: 90 fL (ref 78.0–100.0)
Platelets: 195 10*3/uL (ref 150–400)
RBC: 5 MIL/uL (ref 3.87–5.11)
RDW: 12.9 % (ref 11.5–15.5)
WBC: 8.5 10*3/uL (ref 4.0–10.5)

## 2016-12-16 LAB — I-STAT TROPONIN, ED: Troponin i, poc: 0.06 ng/mL (ref 0.00–0.08)

## 2016-12-16 LAB — CBG MONITORING, ED: GLUCOSE-CAPILLARY: 114 mg/dL — AB (ref 65–99)

## 2016-12-16 LAB — APTT: aPTT: 30 seconds (ref 24–36)

## 2016-12-16 LAB — PROTIME-INR
INR: 0.96
Prothrombin Time: 12.7 seconds (ref 11.4–15.2)

## 2016-12-16 MED ORDER — ENOXAPARIN SODIUM 40 MG/0.4ML ~~LOC~~ SOLN
40.0000 mg | SUBCUTANEOUS | Status: DC
  Administered 2016-12-17: 40 mg via SUBCUTANEOUS
  Filled 2016-12-16: qty 0.4

## 2016-12-16 MED ORDER — OXYCODONE HCL 5 MG PO TABS: 5.0000 mg | ORAL_TABLET | Freq: Every day | ORAL | Status: DC | PRN

## 2016-12-16 MED ORDER — HYDROXYZINE PAMOATE 25 MG PO CAPS
25.0000 mg | ORAL_CAPSULE | Freq: Every day | ORAL | Status: DC
  Filled 2016-12-16 (×2): qty 1

## 2016-12-16 MED ORDER — ACETAMINOPHEN 325 MG PO TABS: 650.0000 mg | ORAL_TABLET | Freq: Four times a day (QID) | ORAL | Status: DC | PRN

## 2016-12-16 MED ORDER — SODIUM CHLORIDE 0.9 % IV SOLN
INTRAVENOUS | Status: AC
  Administered 2016-12-17: 01:00:00 via INTRAVENOUS

## 2016-12-16 MED ORDER — CELECOXIB 200 MG PO CAPS
200.0000 mg | ORAL_CAPSULE | Freq: Every day | ORAL | Status: DC | PRN
  Filled 2016-12-16: qty 1

## 2016-12-16 MED ORDER — VITAMIN B-12 100 MCG PO TABS
500.0000 ug | ORAL_TABLET | Freq: Every day | ORAL | Status: DC
  Administered 2016-12-17: 500 ug via ORAL
  Filled 2016-12-16: qty 5

## 2016-12-16 MED ORDER — VITAMIN D 1000 UNITS PO TABS
1000.0000 [IU] | ORAL_TABLET | Freq: Every day | ORAL | Status: DC
  Administered 2016-12-17: 1000 [IU] via ORAL
  Filled 2016-12-16 (×2): qty 1

## 2016-12-16 MED ORDER — CARISOPRODOL 350 MG PO TABS: 350.0000 mg | ORAL_TABLET | Freq: Every evening | ORAL | Status: DC | PRN

## 2016-12-16 MED ORDER — OXYCODONE-ACETAMINOPHEN 10-325 MG PO TABS: 1.0000 | ORAL_TABLET | Freq: Every day | ORAL | Status: DC | PRN

## 2016-12-16 MED ORDER — LABETALOL HCL 5 MG/ML IV SOLN: 5.0000 mg | INTRAVENOUS | Status: DC | PRN

## 2016-12-16 MED ORDER — ASPIRIN 81 MG PO CHEW
81.0000 mg | CHEWABLE_TABLET | Freq: Every day | ORAL | Status: DC
  Administered 2016-12-17 (×2): 81 mg via ORAL
  Filled 2016-12-16 (×2): qty 1

## 2016-12-16 MED ORDER — DIAZEPAM 5 MG PO TABS
10.0000 mg | ORAL_TABLET | Freq: Once | ORAL | Status: AC
  Administered 2016-12-16: 10 mg via ORAL
  Filled 2016-12-16: qty 2

## 2016-12-16 MED ORDER — STROKE: EARLY STAGES OF RECOVERY BOOK
Freq: Once | Status: AC
  Administered 2016-12-17: 01:00:00
  Filled 2016-12-16: qty 1

## 2016-12-16 MED ORDER — OXYCODONE-ACETAMINOPHEN 5-325 MG PO TABS: 1.0000 | ORAL_TABLET | Freq: Every day | ORAL | Status: DC | PRN

## 2016-12-16 MED ORDER — DIAZEPAM 5 MG PO TABS: 10.0000 mg | ORAL_TABLET | Freq: Every day | ORAL | Status: DC | PRN

## 2016-12-16 MED ORDER — SENNOSIDES-DOCUSATE SODIUM 8.6-50 MG PO TABS
1.0000 | ORAL_TABLET | Freq: Every evening | ORAL | Status: DC | PRN
  Filled 2016-12-16: qty 1

## 2016-12-16 MED ORDER — BUPROPION HCL ER (XL) 150 MG PO TB24
300.0000 mg | ORAL_TABLET | Freq: Every day | ORAL | Status: DC
  Administered 2016-12-17: 300 mg via ORAL
  Filled 2016-12-16: qty 2

## 2016-12-16 NOTE — ED Notes (Signed)
Patient transported to CT 

## 2016-12-16 NOTE — ED Notes (Signed)
Patient transported to MRI 

## 2016-12-16 NOTE — ED Notes (Signed)
Adalberto Ill, [phone]

## 2016-12-16 NOTE — ED Triage Notes (Signed)
Pt presents with c/o stroke symptoms. She was last normal before she went to bed last night. She woke this morning with trouble walking, had difficulty getting herself out of bed and doing normal activities. When she went to physical therapy her therapist noticed that her speech seemed slurred and called family to transport to ED. Family feels that her speech has improved since they picked her up from PT. She reports falling outside yesterday while picking weeds and has had some back and R side pain since

## 2016-12-16 NOTE — ED Notes (Signed)
Patient transported to X-ray 

## 2016-12-16 NOTE — H&P (Signed)
History and Physical    ALANNA STORTI UXN:[id number] DOB: February 10, 1930 DOA: 12/16/2016  PCP: Kandice Hams, MD   Patient coming from: Home, by way of PT   Chief Complaint: Leg weakness, slurred speech  HPI: [name] is a 83 y.o. female with medical history significant for untreated hypertension, depression, anxiety, and chronic back pain who presents to the emergency department for evaluation of weakness and slurred speech. Patient reports that she was in her usual state of health when she went to bed last night, but had difficulty getting up from bed upon waking this morning secondary to weakness. Patient reported difficulty walking secondary to weakness. Weakness persisted throughout the morning and later, had a physical therapy appointment, she was noted to have slurred speech, so the therapist called the patient's family to advise transporting her to the emergency department. Patient denies headache, change in vision or hearing, or confusion. She denies dysphagia or choking. She reports falling in the garden yesterday, but denies hitting her head or losing consciousness. She is not anticoagulated. She has not experienced similar symptoms previously. Patient noted improvement on her way to the ED, and per family, she seems to have returned to her baseline in the ED.  ED Course: Upon arrival to the ED, patient is found to be afebrile, saturating well on room air, hypertensive to 200/100 range, and with vitals otherwise stable. EKG features a normal sinus rhythm with chronic right bundle branch block and a left posterior fascicular block. Chest x-ray is notable for cardiomegaly without any acute cardiopulmonary disease. Noncontrast head CT is negative for acute intracranial abnormality, but notable for remote left basal ganglia lacunar infarct. Also noted on the head CT is generalized central atrophy with moderate chronic microvascular disease. Chemistry panel and CBC are unremarkable,  INR is within the normal limits, and troponin is also within the normal limits. Neurology was consulted by the ED physician and has evaluated the patient in the emergency department. Patient has remained hemodynamically stable in the ED, though severely hypertensive, and will be admitted to the telemetry unit for ongoing evaluation and management of transient weakness and dysarthria, now resolved, concerning for possible TIA.  Review of Systems:  All other systems reviewed and apart from HPI, are negative.  Past Medical History:  Diagnosis Date  . Adenomatous colon polyp 04/2005  . Allergic    Anectine  . ANXIETY 10/13/2007  . BACK PAIN 03/01/2007  . BREAST CANCER, HX OF 12/06/2007  . Cancer [facility])    RIGHT mastectomy with node dissection  . Complication of anesthesia    family aggergy to annectine  . Compression fracture of L4 lumbar vertebra (HCC) 02/2013  . CONSTIPATION 01/06/2009  . DEPRESSION 10/13/2007  . Diverticular disease   . DIVERTICULOSIS, COLON 05/11/2005  . External hemorrhoids without mention of complication 2/54/2706  . FATIGUE 08/07/2008  . HEMORRHOIDS, INTERNAL 05/11/2005  . Hypercalcemia 04/23/2009  . HYPERLIPIDEMIA NEC/NOS 03/01/2007  . HYPERTENSION 10/13/2007  . Impaired fasting glucose 11/14/2008  . INTERSTITIAL CYSTITIS 09/06/2007  . Irritable bowel syndrome 01/06/2009  . LIVER MASS 05/15/2009  . OSTEOPOROSIS 05/09/2009  . PARESTHESIA 09/06/2007  . POLYCYTHEMIA 04/23/2009  . STENOSIS, SPINAL, UNSPC REGION 03/01/2007  . Thyroid disease     Past Surgical History:  Procedure Laterality Date  . ABDOMINAL HYSTERECTOMY    . BACK SURGERY    . BREAST SURGERY     mastectomy -right  . CERVICAL SPINE SURGERY    . CHOLECYSTECTOMY    . COLONOSCOPY W/  BIOPSIES    . cortisone injection  10/16/12   left knee  . FIXATION KYPHOPLASTY LUMBAR SPINE    . HEMORRHOID BANDING  2015-16  . PARATHYROID EXPLORATION  06/29/2011   Procedure: PARATHYROID EXPLORATION;  Surgeon: Earnstine Regal, MD;   Location: [facility] ORS;  Service: General;  Laterality: Left;  Left Superior Parathyroidectomy  . PARATHYROIDECTOMY  06/29/11     reports that she quit smoking about 39 years ago. Her smoking use included Cigarettes. She has a 42.00 pack-year smoking history. She has never used smokeless tobacco. She reports that she does not drink alcohol or use drugs.  Allergies  Allergen Reactions  . Anectine [Succinylcholine Chloride] Other (See Comments)    Reaction:  Patient states that her body wasn't functioning.  Pt states she was put on life support.   Bretta Bang Other (See Comments)    Elevated blood pressure, "Doesn't work"  . Dilaudid [Hydromorphone Hcl] Other (See Comments)    Slept for 3 days and 3 nights  . Hydrocodone-Acetaminophen Other (See Comments)     Hallucinations, paranoia, amnesia  . Iodine Hives and Other (See Comments)    , asthma  . Naproxen Sodium Other (See Comments)    REACTION: Asthma  . Oxycodone Hcl Other (See Comments)     Hallucinations, paranoia, amnesia  . Pentosan Polysulfate Sodium Other (See Comments)    REACTION: unspecified per patient  . Pseudoephedrine Other (See Comments)     Palpitations  . Succinylcholine     REACTION: "Can't wake up".  . Tape Dermatitis    Red inflammed  . Tramadol Hcl Other (See Comments)    Unknown reaction  . Vioxx [Rofecoxib]     "Doesn't work for pain"  . Shellfish Allergy     Runny nose, cough, hoarseness    Family History  Problem Relation Age of Onset  . Heart disease Sister   . Hypertension Sister   . Asthma Sister   . Breast cancer Maternal Aunt   . Ovarian cancer Paternal Aunt   . Colon cancer Neg Hx   . Esophageal cancer Neg Hx   . Pancreatic cancer Neg Hx   . Rectal cancer Neg Hx   . Stomach cancer Neg Hx      Prior to Admission medications   Medication Sig Start Date End Date Taking? Authorizing Provider  acetaminophen (TYLENOL) 500 MG tablet Take 1,000 mg by mouth every 6 (six) hours as needed for mild  pain.   Yes Historical Provider, MD  Alum & Mag Hydroxide-Simeth (GELUSIL PO) Take 1 tablet by mouth daily as needed (acid reflux).   Yes Historical Provider, MD  Ascorbic Acid (VITAMIN C PO) Take 1 tablet by mouth daily as needed (when not eating enough fruit).   Yes Historical Provider, MD  buPROPion (WELLBUTRIN XL) 300 MG 24 hr tablet Take 300 mg by mouth daily.   Yes Historical Provider, MD  CALCIUM-MAGNESIUM PO Take 1 tablet by mouth daily.   Yes Historical Provider, MD  carisoprodol (SOMA) 250 MG tablet Take 1 tablet (250 mg total) by mouth daily as needed (Pt is also on the 350 mg Soma.). For muscle spasms. Patient taking differently: Take 250 mg by mouth daily as needed (muscle spasms).  12/17/14  Yes Hoyt Koch, MD  carisoprodol (SOMA) 350 MG tablet Take 350 mg by mouth at bedtime as needed for muscle spasms.   Yes Historical Provider, MD  celecoxib (CELEBREX) 200 MG capsule Take 1 capsule (200 mg total) by mouth  daily. Patient taking differently: Take 200 mg by mouth daily as needed (arthritis pain).  10/20/15  Yes Hoyt Koch, MD  Cholecalciferol (VITAMIN D3) 1000 UNITS CAPS Take 1,000 Units by mouth daily.    Yes Historical Provider, MD  Cyanocobalamin (VITAMIN B-12 PO) Take 1 tablet by mouth daily.   Yes Historical Provider, MD  diazepam (VALIUM) 10 MG tablet Take 10 mg by mouth daily as needed for anxiety.   Yes Historical Provider, MD  estradiol (ESTRACE) 0.1 MG/GM vaginal cream Place 1 Applicatorful vaginally daily as needed (for dryness).    Yes Historical Provider, MD  fluticasone (FLONASE) 50 MCG/ACT nasal spray Place 2 sprays into both nostrils daily. Patient taking differently: Place 2 sprays into both nostrils daily as needed for allergies.  09/06/14  Yes Hoyt Koch, MD  hydrOXYzine (VISTARIL) 25 MG capsule Take 25 mg by mouth at bedtime.   Yes Historical Provider, MD  Methenamine-Sodium Salicylate (CYSTEX PO) Take 1 tablet by mouth daily as needed  (urinary tract pain).   Yes Historical Provider, MD  Multiple Vitamin (MULTIVITAMIN WITH MINERALS) TABS tablet Take 1 tablet by mouth daily as needed (when not eating well).   Yes Historical Provider, MD  oxyCODONE-acetaminophen (PERCOCET) 10-325 MG tablet Take 1 tablet by mouth daily as needed for pain.   Yes Historical Provider, MD  VITAMIN E PO Take 1 capsule by mouth daily.   Yes Historical Provider, MD  Zoledronic Acid (RECLAST IV) Inject into the vein. Yearly injection - last injection March 2018   Yes Historical Provider, MD    Physical Exam: Vitals:   12/16/16 1730 12/16/16 1830 12/16/16 2000 12/16/16 2100  BP: (!) 176/92 (!) 206/88 (!) 209/102 (!) 209/99  Pulse: 81  92   Resp: 20 15 20 18   Temp:      TempSrc:      SpO2: 96%  95%       Constitutional: No respiratory distress, anxious, no pallor or diaphoresis.  Eyes: PERTLA, lids and conjunctivae normal ENMT: Mucous membranes are moist. Posterior pharynx clear of any exudate or lesions.   Neck: normal, supple, no masses, no thyromegaly Respiratory: clear to auscultation bilaterally, no wheezing, no crackles. Normal respiratory effort.   Cardiovascular: S1 & S2 heard, regular rate and rhythm. No significant JVD. Abdomen: No distension, no tenderness, no masses palpated. Bowel sounds normal.  Musculoskeletal: no clubbing / cyanosis. No joint deformity upper and lower extremities.    Skin: no significant rashes, lesions, ulcers. Warm, dry, well-perfused. Neurologic: CN 2-12 grossly intact. Sensation intact, DTR normal. Strength 5/5 in all 4 limbs.  Psychiatric: Alert and oriented x 3. Anxious.      Labs on Admission: I have personally reviewed following labs and imaging studies  CBC:  Recent Labs Lab 12/16/16 1706 12/16/16 1716  WBC 8.5  --   NEUTROABS 4.1  --   HGB 14.8 15.0  HCT 45.0 44.0  MCV 90.0  --   PLT 195  --    Basic Metabolic Panel:  Recent Labs Lab 12/16/16 1706 12/16/16 1716  NA 139 138  K 4.0  4.1  CL 105 103  CO2 25  --   GLUCOSE 121* 122*  BUN 14 20  CREATININE 0.94 0.90  CALCIUM 9.0  --    GFR: CrCl cannot be calculated (Unknown ideal weight.). Liver Function Tests:  Recent Labs Lab 12/16/16 1706  AST 21  ALT 16  ALKPHOS 73  BILITOT 0.3  PROT 6.1*  ALBUMIN 3.9  No results for input(s): LIPASE, AMYLASE in the last 168 hours. No results for input(s): AMMONIA in the last 168 hours. Coagulation Profile:  Recent Labs Lab 12/16/16 1706  INR 0.96   Cardiac Enzymes: No results for input(s): CKTOTAL, CKMB, CKMBINDEX, TROPONINI in the last 168 hours. BNP (last 3 results) No results for input(s): PROBNP in the last 8760 hours. HbA1C: No results for input(s): HGBA1C in the last 72 hours. CBG:  Recent Labs Lab 12/16/16 1703  GLUCAP 114*   Lipid Profile: No results for input(s): CHOL, HDL, LDLCALC, TRIG, CHOLHDL, LDLDIRECT in the last 72 hours. Thyroid Function Tests: No results for input(s): TSH, T4TOTAL, FREET4, T3FREE, THYROIDAB in the last 72 hours. Anemia Panel: No results for input(s): VITAMINB12, FOLATE, FERRITIN, TIBC, IRON, RETICCTPCT in the last 72 hours. Urine analysis:    Component Value Date/Time   COLORURINE YELLOW 07/16/2013 1421   APPEARANCEUR CLOUDY (A) 07/16/2013 1421   LABSPEC 1.011 07/16/2013 1421   PHURINE 8.0 07/16/2013 1421   GLUCOSEU NEGATIVE 07/16/2013 1421   GLUCOSEU NEGATIVE 10/14/2006 0836   HGBUR TRACE (A) 07/16/2013 1421   HGBUR trace-lysed 08/07/2008 0922   BILIRUBINUR n 10/02/2013 1123   KETONESUR NEGATIVE 07/16/2013 1421   PROTEINUR n 10/02/2013 1123   PROTEINUR NEGATIVE 07/16/2013 1421   UROBILINOGEN 0.2 10/02/2013 1123   UROBILINOGEN 0.2 07/16/2013 1421   NITRITE n 10/02/2013 1123   NITRITE NEGATIVE 07/16/2013 1421   LEUKOCYTESUR small (1+) 10/02/2013 1123   Sepsis Labs: @LABRCNTIP (procalcitonin:4,lacticidven:4) )No results found for this or any previous visit (from the past 240 hour(s)).   Radiological  Exams on Admission: Dg Ribs Unilateral W/chest Right  Result Date: 12/16/2016 CLINICAL DATA:  Acute right chest and rib pain following fall yesterday. Initial encounter. EXAM: RIGHT RIBS AND CHEST - 3+ VIEW COMPARISON:  06/09/2015 and prior radiographs FINDINGS: Cardiomegaly again identified. There is no evidence of focal airspace disease, pulmonary edema, suspicious pulmonary nodule/mass, pleural effusion, or pneumothorax. No acute bony abnormalities are identified. Remote right rib fractures are identified. IMPRESSION: Cardiomegaly without evidence of acute cardiopulmonary disease. No evidence of acute rib fracture. Multiple remote right rib fractures. Electronically Signed   By: Margarette Canada M.D.   On: 12/16/2016 19:36   Ct Head Wo Contrast  Result Date: 12/16/2016 CLINICAL DATA:  Initial evaluation for acute slurred speech, dizziness, headaches. EXAM: CT HEAD WITHOUT CONTRAST TECHNIQUE: Contiguous axial images were obtained from the base of the skull through the vertex without intravenous contrast. COMPARISON:  None available. FINDINGS: Brain: Diffuse prominence of the CSF containing spaces is compatible with generalized cerebral atrophy. Patchy and confluent hypodensity within the periventricular and deep white matter both cerebral hemispheres most consistent with chronic small vessel ischemic disease. Superimposed remote lacunar infarct present within the left lentiform nucleus. No acute intracranial hemorrhage. No evidence for acute large vessel territory infarct. No mass lesion, midline shift or mass effect. No hydrocephalus. No extra-axial fluid collection. Vascular: No asymmetric hyperdense vessel. Vessels are somewhat diffusely dense, suggesting underlying dehydration. Scattered atherosclerosis noted within the carotid siphons. Skull: Scalp soft tissues within normal limits.  Calvarium intact. Sinuses/Orbits: Globes and orbital soft tissues within normal limits. Patient status post lens extraction  bilaterally. Paranasal sinuses are clear. No mastoid effusion. 1. No acute intracranial process identified. 2. Remote left basal ganglia lacunar infarct. 3. Generalized cerebral atrophy with moderate chronic microvascular disease. Electronically Signed   By: Jeannine Boga M.D.   On: 12/16/2016 17:14    EKG: Independently reviewed. Normal sinus rhythm, chronic RBBB,  new LPFB   Assessment/Plan  1. Acute weakness and dysarthria, now resolved  - Pt presents after waking with weakness and difficultly ambulating, later noted to have slurred speech, with an apparent return to baseline by time of admission  - Head CT is negative for acute intracranial abnormality, but notable for remote left basal ganglia lacunar infarct and generalized central atrophy with moderate chronic microvascular disease  - Neurology is consulting and much appreciated; differential dx includes TIA, dementia, hypertensive encephalopathy - Observe on telemetry with frequent neuro checks, PT/OT/SLP evals  - Obtain MRI brain, MRA head and neck, echocardiogram, fasting lipids, and A1c  - Per neuro recs, maintain SBP <180 given that hypertensive encephalopathy is on Ddx  - Start ASA 81 qD  2. Hypertension with hypertensive urgency  - Pt has hx of HTN, but not on any antihypertensives  - BP as high as 210/100 in ED, possibly secondary to acute ischemic infarct, but hypertensive encephalopathy is also considered as possible etiology for presenting complaints  - Per neuro recs, maintain SBP <180; will use prn labetalol IVP's   3. Depression, anxiety  - Pt appears anxious in ED given the circumstances   - Continue Wellbutrin and prn Valium    4. Chronic pain  - Pt reports chronic back pain worse secondary to the hospital bed; no saddle anesthesia or incontinence; leg weakness has resolved   - Continue her home regimen of prn Percocet and prn Soma   5. EKG abnormalities  - Pt has chronic RBBB seen on admission EKG, but also  new LPFB - She is asymptomatic with this - She is monitored on telemetry and echocardiogram is ordered     DVT prophylaxis: sq Lovenox  Code Status: Full  Family Communication: Nephew updated at bedside Disposition Plan: Observe on telemetry Consults called: Neurology Admission status: Observation    Vianne Bulls, MD [facility] Pager [phone]  If 7PM-7AM, please contact night-coverage [email or web address] Password [facility]  12/16/2016, 9:52 PM

## 2016-12-16 NOTE — ED Notes (Signed)
Neurologist at the bedside 

## 2016-12-16 NOTE — ED Notes (Signed)
Pt requesting Valium for her blood pressure. Pt nephew states she normally takes this BID.

## 2016-12-16 NOTE — ED Provider Notes (Signed)
[facility] DEPT Provider Note   CSN: [id number] Arrival date & time: 12/16/16  1619     History   Chief Complaint Chief Complaint  Patient presents with  . Stroke Symptoms    HPI SHIRYL RUDDY is a 83 y.o. female.  The history is provided by the patient, medical records and a relative. No language interpreter was used.   DESHAWN SKELLEY is a 83 y.o. female  with a PMH of HTN, HLD who presents to the Emergency Department for concerns of dizziness and slurred speech. Last seen normal last night. She states that she was working outside pulling weeds yesterday when she somehow fell. She denies hitting her head. She states that she fell onto her right side. Since the fall, she was a little unsteady on her feet, but did not think much about it. She went to sleep as usual. This morning, she awoke and felt off balance and very dizzy. She went to her physical therapy appointment where they called family, encouraging her to go to the emergency department. She typically ambulates independently from the car into therapy, however today, she was holding onto the guardrails and looked as if she was going to fall. When they spoke with her, they thought her speech was slurring. Family at bedside states that they were called and went straight to PT. They too felt as if her speech was slurred and both legs were weak. Since coming to the ER, patient and family feel as if symptoms are close to baseline. Not on blood thinners. No history of TIA / stroke in the past. Denies history of similar symptoms but per chart review, did see neurology 07/2016 for dizziness.   Past Medical History:  Diagnosis Date  . Adenomatous colon polyp 04/2005  . Allergic    Anectine  . ANXIETY 10/13/2007  . BACK PAIN 03/01/2007  . BREAST CANCER, HX OF 12/06/2007  . [facility])    RIGHT mastectomy with node dissection  . Complication of anesthesia    family aggergy to annectine  . Compression fracture of L4 lumbar  vertebra (HCC) 02/2013  . CONSTIPATION 01/06/2009  . DEPRESSION 10/13/2007  . Diverticular disease   . DIVERTICULOSIS, COLON 05/11/2005  . External hemorrhoids without mention of complication 11/29/8117  . FATIGUE 08/07/2008  . HEMORRHOIDS, INTERNAL 05/11/2005  . Hypercalcemia 04/23/2009  . HYPERLIPIDEMIA NEC/NOS 03/01/2007  . HYPERTENSION 10/13/2007  . Impaired fasting glucose 11/14/2008  . INTERSTITIAL CYSTITIS 09/06/2007  . Irritable bowel syndrome 01/06/2009  . LIVER MASS 05/15/2009  . OSTEOPOROSIS 05/09/2009  . PARESTHESIA 09/06/2007  . POLYCYTHEMIA 04/23/2009  . STENOSIS, SPINAL, UNSPC REGION 03/01/2007  . Thyroid disease     Patient Active Problem List   Diagnosis Date Noted  . TIA (transient ischemic attack) 12/16/2016  . Dysarthria 12/16/2016  . Leg weakness 12/16/2016  . EKG abnormalities 12/16/2016  . Elevated blood pressure 12/22/2014  . Internal hemorrhoids with bleeding and prolapse 11/04/2014  . Lactose intolerance 11/04/2014  . Hemorrhoid 09/11/2014  . Rectal bleeding 09/11/2014  . IBS (irritable bowel syndrome) 09/25/2013  . Hyperparathyroidism, primary (B and E) 05/26/2011  . Osteoporosis 05/09/2009  . POLYCYTHEMIA 04/23/2009  . IMPAIRED FASTING GLUCOSE 11/14/2008  . BREAST CANCER, HX OF 12/06/2007  . Anxiety state 10/13/2007  . Essential hypertension 10/13/2007  . INTERSTITIAL CYSTITIS 09/06/2007  . HYPERLIPIDEMIA NEC/NOS 03/01/2007  . Backache 03/01/2007    Past Surgical History:  Procedure Laterality Date  . ABDOMINAL HYSTERECTOMY    . BACK SURGERY    .  BREAST SURGERY     mastectomy -right  . CERVICAL SPINE SURGERY    . CHOLECYSTECTOMY    . COLONOSCOPY W/ BIOPSIES    . cortisone injection  10/16/12   left knee  . FIXATION KYPHOPLASTY LUMBAR SPINE    . HEMORRHOID BANDING  2015-16  . PARATHYROID EXPLORATION  06/29/2011   Procedure: PARATHYROID EXPLORATION;  Surgeon: Earnstine Regal, MD;  Location: [facility] ORS;  Service: General;  Laterality: Left;  Left Superior  Parathyroidectomy  . PARATHYROIDECTOMY  06/29/11    OB History    No data available       Home Medications    Prior to Admission medications   Medication Sig Start Date End Date Taking? Authorizing Provider  acetaminophen (TYLENOL) 500 MG tablet Take 1,000 mg by mouth every 6 (six) hours as needed for mild pain.   Yes Historical Provider, MD  Alum & Mag Hydroxide-Simeth (GELUSIL PO) Take 1 tablet by mouth daily as needed (acid reflux).   Yes Historical Provider, MD  Ascorbic Acid (VITAMIN C PO) Take 1 tablet by mouth daily as needed (when not eating enough fruit).   Yes Historical Provider, MD  buPROPion (WELLBUTRIN XL) 300 MG 24 hr tablet Take 300 mg by mouth daily.   Yes Historical Provider, MD  CALCIUM-MAGNESIUM PO Take 1 tablet by mouth daily.   Yes Historical Provider, MD  carisoprodol (SOMA) 250 MG tablet Take 1 tablet (250 mg total) by mouth daily as needed (Pt is also on the 350 mg Soma.). For muscle spasms. Patient taking differently: Take 250 mg by mouth daily as needed (muscle spasms).  12/17/14  Yes Hoyt Koch, MD  carisoprodol (SOMA) 350 MG tablet Take 350 mg by mouth at bedtime as needed for muscle spasms.   Yes Historical Provider, MD  celecoxib (CELEBREX) 200 MG capsule Take 1 capsule (200 mg total) by mouth daily. Patient taking differently: Take 200 mg by mouth daily as needed (arthritis pain).  10/20/15  Yes Hoyt Koch, MD  Cholecalciferol (VITAMIN D3) 1000 UNITS CAPS Take 1,000 Units by mouth daily.    Yes Historical Provider, MD  Cyanocobalamin (VITAMIN B-12 PO) Take 1 tablet by mouth daily.   Yes Historical Provider, MD  diazepam (VALIUM) 10 MG tablet Take 10 mg by mouth daily as needed for anxiety.   Yes Historical Provider, MD  estradiol (ESTRACE) 0.1 MG/GM vaginal cream Place 1 Applicatorful vaginally daily as needed (for dryness).    Yes Historical Provider, MD  fluticasone (FLONASE) 50 MCG/ACT nasal spray Place 2 sprays into both nostrils  daily. Patient taking differently: Place 2 sprays into both nostrils daily as needed for allergies.  09/06/14  Yes Hoyt Koch, MD  hydrOXYzine (VISTARIL) 25 MG capsule Take 25 mg by mouth at bedtime.   Yes Historical Provider, MD  Methenamine-Sodium Salicylate (CYSTEX PO) Take 1 tablet by mouth daily as needed (urinary tract pain).   Yes Historical Provider, MD  Multiple Vitamin (MULTIVITAMIN WITH MINERALS) TABS tablet Take 1 tablet by mouth daily as needed (when not eating well).   Yes Historical Provider, MD  oxyCODONE-acetaminophen (PERCOCET) 10-325 MG tablet Take 1 tablet by mouth daily as needed for pain.   Yes Historical Provider, MD  VITAMIN E PO Take 1 capsule by mouth daily.   Yes Historical Provider, MD  Zoledronic Acid (RECLAST IV) Inject into the vein. Yearly injection - last injection March 2018   Yes Historical Provider, MD  hydrocortisone (ANUSOL-HC) 25 MG suppository Place 1 suppository (25  mg total) rectally 2 (two) times daily. Patient not taking: Reported on 12/16/2016 09/11/14   Loralie Champagne, PA-C    Family History Family History  Problem Relation Age of Onset  . Heart disease Sister   . Hypertension Sister   . Asthma Sister   . Breast cancer Maternal Aunt   . Ovarian cancer Paternal Aunt   . Colon cancer Neg Hx   . Esophageal cancer Neg Hx   . Pancreatic cancer Neg Hx   . Rectal cancer Neg Hx   . Stomach cancer Neg Hx     Social History Social History  Substance Use Topics  . Smoking status: Former Smoker    Packs/day: 1.00    Years: 42.00    Types: Cigarettes    Quit date: 08/23/1977  . Smokeless tobacco: Never Used  . Alcohol use No     Allergies   Anectine [succinylcholine chloride]; Decongest; Dilaudid [hydromorphone hcl]; Hydrocodone-acetaminophen; Iodine; Naproxen sodium; Oxycodone hcl; Pentosan polysulfate sodium; Pseudoephedrine; Succinylcholine; Tape; Tramadol hcl; Vioxx [rofecoxib]; and Shellfish allergy   Review of Systems Review of  Systems  Neurological: Positive for dizziness, speech difficulty and weakness. Negative for headaches.  All other systems reviewed and are negative.    Physical Exam Updated Vital Signs BP (!) 209/99   Pulse 92   Temp 97.8 F (36.6 C)   Resp 18   SpO2 95%   Physical Exam  Constitutional: She is oriented to person, place, and time. She appears well-developed and well-nourished. No distress.  HENT:  Head: Normocephalic and atraumatic.  Cardiovascular: Normal rate, regular rhythm and normal heart sounds.   No murmur heard. Pulmonary/Chest: Effort normal and breath sounds normal. No respiratory distress. She has no wheezes. She has no rales.  Abdominal: Soft. She exhibits no distension. There is no tenderness.  Musculoskeletal:  Tenderness to palpation along right lateral rib cage with no crepitus, erythema, ecchymosis or swelling.   Neurological: She is alert and oriented to person, place, and time.  Alert, oriented, thought content appropriate, able to give a coherent history. Speech is clear and goal oriented, able to follow commands.  Cranial Nerves:  II:  Peripheral visual fields grossly normal, pupils equal, round, reactive to light III, IV, VI: EOM intact bilaterally, ptosis not present V,VII: smile symmetric, eyes kept closed tightly against resistance, facial light touch sensation equal VIII: hearing grossly normal IX, X: symmetric soft palate movement, uvula elevates symmetrically  XI: bilateral shoulder shrug symmetric and strong XII: midline tongue extension 5/5 muscle strength in upper and lower extremities bilaterally including strong and equal grip strength and dorsiflexion/plantar flexion Sensory to light touch normal in all four extremities.  Normal finger-to-nose and rapid alternating movements; no drift.  Skin: Skin is warm and dry.  Nursing note and vitals reviewed.    ED Treatments / Results  Labs (all labs ordered are listed, but only abnormal results  are displayed) Labs Reviewed  COMPREHENSIVE METABOLIC PANEL - Abnormal; Notable for the following:       Result Value   Glucose, Bld 121 (*)    Total Protein 6.1 (*)    GFR calc non Af Amer 54 (*)    All other components within normal limits  CBG MONITORING, ED - Abnormal; Notable for the following:    Glucose-Capillary 114 (*)    All other components within normal limits  I-STAT CHEM 8, ED - Abnormal; Notable for the following:    Glucose, Bld 122 (*)    Calcium, Ion 1.09 (*)  All other components within normal limits  PROTIME-INR  APTT  CBC  DIFFERENTIAL  I-STAT TROPOININ, ED    EKG  EKG Interpretation  Date/Time:  Thursday December 16 2016 16:24:49 EDT Ventricular Rate:  91 PR Interval:  128 QRS Duration: 132 QT Interval:  446 QTC Calculation: 548 R Axis:   145 Text Interpretation:  Normal sinus rhythm Right bundle branch block Left posterior fascicular block  Bifascicular block  T wave abnormality, consider inferior ischemia Abnormal ECG No significant change since last tracing Confirmed by LITTLE MD, RACHEL [phone]) on 12/16/2016 4:53:27 PM       Radiology Dg Ribs Unilateral W/chest Right  Result Date: 12/16/2016 CLINICAL DATA:  Acute right chest and rib pain following fall yesterday. Initial encounter. EXAM: RIGHT RIBS AND CHEST - 3+ VIEW COMPARISON:  06/09/2015 and prior radiographs FINDINGS: Cardiomegaly again identified. There is no evidence of focal airspace disease, pulmonary edema, suspicious pulmonary nodule/mass, pleural effusion, or pneumothorax. No acute bony abnormalities are identified. Remote right rib fractures are identified. IMPRESSION: Cardiomegaly without evidence of acute cardiopulmonary disease. No evidence of acute rib fracture. Multiple remote right rib fractures. Electronically Signed   By: Margarette Canada M.D.   On: 12/16/2016 19:36   Ct Head Wo Contrast  Result Date: 12/16/2016 CLINICAL DATA:  Initial evaluation for acute slurred speech, dizziness,  headaches. EXAM: CT HEAD WITHOUT CONTRAST TECHNIQUE: Contiguous axial images were obtained from the base of the skull through the vertex without intravenous contrast. COMPARISON:  None available. FINDINGS: Brain: Diffuse prominence of the CSF containing spaces is compatible with generalized cerebral atrophy. Patchy and confluent hypodensity within the periventricular and deep white matter both cerebral hemispheres most consistent with chronic small vessel ischemic disease. Superimposed remote lacunar infarct present within the left lentiform nucleus. No acute intracranial hemorrhage. No evidence for acute large vessel territory infarct. No mass lesion, midline shift or mass effect. No hydrocephalus. No extra-axial fluid collection. Vascular: No asymmetric hyperdense vessel. Vessels are somewhat diffusely dense, suggesting underlying dehydration. Scattered atherosclerosis noted within the carotid siphons. Skull: Scalp soft tissues within normal limits.  Calvarium intact. Sinuses/Orbits: Globes and orbital soft tissues within normal limits. Patient status post lens extraction bilaterally. Paranasal sinuses are clear. No mastoid effusion. 1. No acute intracranial process identified. 2. Remote left basal ganglia lacunar infarct. 3. Generalized cerebral atrophy with moderate chronic microvascular disease. Electronically Signed   By: Jeannine Boga M.D.   On: 12/16/2016 17:14    Procedures Procedures (including critical care time)  Medications Ordered in ED Medications  diazepam (VALIUM) tablet 10 mg (not administered)     Initial Impression / Assessment and Plan / ED Course  I have reviewed the triage vital signs and the nursing notes.  Pertinent labs & imaging results that were available during my care of the patient were reviewed by me and considered in my medical decision making (see chart for details).    YANEL DOMBROSKY is a 83 y.o. female who presents to ED for slurred speech, dizziness  and bilateral lower extremity weakness which was first noticed at physical therapy earlier today. Last known normal last night before bed. No focal neuro deficits on exam. Family at bedside state speech has normalized by the time of evaluation in ED. CT head with no acute intracranial process - does show remote left basal ganglia lacunar infarct. Yesterday, she had a fall while trying to pull weeds, falling onto her right side. She does have tenderness along the ribcage to this area with  no overlying skin changes. X-ray with no evidence of acute rib fractures. Neurology consulted who recommends MR brain and hospitalist admission for TIA workup. Hospitalist consulted who will admit.   Patient seen by and discussed with Dr. Rex Kras who agrees with treatment plan.    Final Clinical Impressions(s) / ED Diagnoses   Final diagnoses:  Slurred speech  Weakness    New Prescriptions New Prescriptions   No medications on file     [place], PA-C 12/16/16 2128    Sharlett Iles, MD 12/16/16 [phone]

## 2016-12-16 NOTE — Consult Note (Signed)
Referring Physician: Dr. Rex Kras    Chief Complaint: Slurred speech  HPI: [name] is an 83 y.o. female who presents with stroke like symptoms. LKN was yesterday night prior to going to bed. On awakening this AM, she had difficulty getting herself out of bed and then noticed that she had trouble walking. Later, at her PT appointment, it was noticed that her speech was slurred. She was then taken to the ED by her family. She feels that her speech has improved since she left PT. She did fall outside yesterday while gardening.   She has no prior history of stroke or MI. She does not take an antiplatelet medication and is not on anticoagulation.   LSN: Yesterday night tPA Given: No: Out of time window  Past Medical History:  Diagnosis Date  . Adenomatous colon polyp 04/2005  . Allergic    Anectine  . ANXIETY 10/13/2007  . BACK PAIN 03/01/2007  . BREAST CANCER, HX OF 12/06/2007  . Cancer [facility])    RIGHT mastectomy with node dissection  . Complication of anesthesia    family aggergy to annectine  . Compression fracture of L4 lumbar vertebra (HCC) 02/2013  . CONSTIPATION 01/06/2009  . DEPRESSION 10/13/2007  . Diverticular disease   . DIVERTICULOSIS, COLON 05/11/2005  . External hemorrhoids without mention of complication 6/94/8546  . FATIGUE 08/07/2008  . HEMORRHOIDS, INTERNAL 05/11/2005  . Hypercalcemia 04/23/2009  . HYPERLIPIDEMIA NEC/NOS 03/01/2007  . HYPERTENSION 10/13/2007  . Impaired fasting glucose 11/14/2008  . INTERSTITIAL CYSTITIS 09/06/2007  . Irritable bowel syndrome 01/06/2009  . LIVER MASS 05/15/2009  . OSTEOPOROSIS 05/09/2009  . PARESTHESIA 09/06/2007  . POLYCYTHEMIA 04/23/2009  . STENOSIS, SPINAL, UNSPC REGION 03/01/2007  . Thyroid disease     Past Surgical History:  Procedure Laterality Date  . ABDOMINAL HYSTERECTOMY    . BACK SURGERY    . BREAST SURGERY     mastectomy -right  . CERVICAL SPINE SURGERY    . CHOLECYSTECTOMY    . COLONOSCOPY W/ BIOPSIES    . cortisone  injection  10/16/12   left knee  . FIXATION KYPHOPLASTY LUMBAR SPINE    . HEMORRHOID BANDING  2015-16  . PARATHYROID EXPLORATION  06/29/2011   Procedure: PARATHYROID EXPLORATION;  Surgeon: Earnstine Regal, MD;  Location: [facility] ORS;  Service: General;  Laterality: Left;  Left Superior Parathyroidectomy  . PARATHYROIDECTOMY  06/29/11    Family History  Problem Relation Age of Onset  . Heart disease Sister   . Hypertension Sister   . Asthma Sister   . Breast cancer Maternal Aunt   . Ovarian cancer Paternal Aunt   . Colon cancer Neg Hx   . Esophageal cancer Neg Hx   . Pancreatic cancer Neg Hx   . Rectal cancer Neg Hx   . Stomach cancer Neg Hx    Social History:  reports that she quit smoking about 39 years ago. Her smoking use included Cigarettes. She has a 42.00 pack-year smoking history. She has never used smokeless tobacco. She reports that she does not drink alcohol or use drugs.  Allergies:  Allergies  Allergen Reactions  . Anectine [Succinylcholine Chloride] Other (See Comments)    Reaction:  Patient states that her body wasn't functioning.  Pt states she was put on life support.   Bretta Bang Other (See Comments)    Elevated blood pressure, "Doesn't work"  . Dilaudid [Hydromorphone Hcl] Other (See Comments)    Slept for 3 days and 3 nights  . Hydrocodone-Acetaminophen  Other (See Comments)     Hallucinations, paranoia, amnesia  . Iodine Hives and Other (See Comments)    , asthma  . Naproxen Sodium Other (See Comments)    REACTION: Asthma  . Oxycodone Hcl Other (See Comments)     Hallucinations, paranoia, amnesia  . Pentosan Polysulfate Sodium Other (See Comments)    REACTION: unspecified per patient  . Pseudoephedrine Other (See Comments)     Palpitations  . Succinylcholine     REACTION: "Can't wake up".  . Tape Dermatitis    Red inflammed  . Tramadol Hcl Other (See Comments)    Unknown reaction  . Vioxx [Rofecoxib]     "Doesn't work for pain"  . Shellfish Allergy      Runny nose, cough, hoarseness    Home Medications:  acetaminophen (TYLENOL) 500 MG tablet Take 1,000 mg by mouth every 6 (six) hours as needed for mild pain. Historical Provider, MD Needs Review  Alum & Mag Hydroxide-Simeth (GELUSIL PO) Take 1 tablet by mouth daily as needed (acid reflux). Historical Provider, MD Needs Review  Ascorbic Acid (VITAMIN C PO) Take 1 tablet by mouth daily as needed (when not eating enough fruit). Historical Provider, MD Needs Review  buPROPion (WELLBUTRIN XL) 300 MG 24 hr tablet Take 300 mg by mouth daily. Historical Provider, MD Needs Review  CALCIUM-MAGNESIUM PO Take 1 tablet by mouth daily. Historical Provider, MD Needs Review  carisoprodol (SOMA) 250 MG tablet Take 1 tablet (250 mg total) by mouth daily as needed (Pt is also on the 350 mg Soma.). For muscle spasms. Hoyt Koch, MD Needs Review   Patient taking differently: Take 250 mg by mouth daily as needed (muscle spasms).     carisoprodol (SOMA) 350 MG tablet Take 350 mg by mouth at bedtime as needed for muscle spasms. Historical Provider, MD Needs Review  celecoxib (CELEBREX) 200 MG capsule Take 1 capsule (200 mg total) by mouth daily. Hoyt Koch, MD Needs Review   Patient taking differently: Take 200 mg by mouth daily as needed (arthritis pain).     Cholecalciferol (VITAMIN D3) 1000 UNITS CAPS Take 1,000 Units by mouth daily.  Historical Provider, MD Needs Review  Cyanocobalamin (VITAMIN B-12 PO) Take 1 tablet by mouth daily. Historical Provider, MD Needs Review  diazepam (VALIUM) 10 MG tablet Take 10 mg by mouth daily as needed for anxiety. Historical Provider, MD Needs Review  estradiol (ESTRACE) 0.1 MG/GM vaginal cream Place 1 Applicatorful vaginally daily as needed (for dryness).  Historical Provider, MD Needs Review  fluticasone (FLONASE) 50 MCG/ACT nasal spray Place 2 sprays into both nostrils daily. Hoyt Koch, MD Needs Review   Patient taking differently: Place 2 sprays  into both nostrils daily as needed for allergies.     hydrOXYzine (VISTARIL) 25 MG capsule Take 25 mg by mouth at bedtime. Historical Provider, MD Needs Review  Methenamine-Sodium Salicylate (CYSTEX PO) Take 1 tablet by mouth daily as needed (urinary tract pain). Historical Provider, MD Needs Review  Multiple Vitamin (MULTIVITAMIN WITH MINERALS) TABS tablet Take 1 tablet by mouth daily as needed (when not eating well). Historical Provider, MD Needs Review  oxyCODONE-acetaminophen (PERCOCET) 10-325 MG tablet Take 1 tablet by mouth daily as needed for pain. Historical Provider, MD Needs Review  VITAMIN E PO Take 1 capsule by mouth daily. Historical Provider, MD Needs Review  Zoledronic Acid (RECLAST IV) Inject into the vein. Yearly injection - last injection March 2018 Historical Provider, MD Needs Review  hydrocortisone (ANUSOL-HC) 25 MG  suppository Place 1 suppository (25 mg total) rectally 2 (two) times daily. Loralie Champagne, PA-C Needs Review   Patient not taking: Reported on 12/16/2016      ROS: Positive for dizziness that is nonvertiginous and not presyncopal. No headache. Denies trouble understanding speech. States speech is back to baseline. Complains of right flank pain from recent fall. Other ROS as per HPI.    Physical Examination: Blood pressure (!) 209/102, pulse 92, temperature 97.8 F (36.6 C), resp. rate 20, SpO2 95 %.  HEENT: [place]/AT Lungs: Respirations unlabored. No gross wheezing.  Ext: Warm and well-perfused  Neurologic Examination: Mental Status: Alert, fully oriented. Somewhat odd affect with decreased attentiveness and some tangentiality. Subtle dysarthria waxes and wanes. Speech is fluent. Comprehension intact for all commands. Slight error with repetition. Naming intact except for "index finger". Registers 3/3 items and recalls 1/3 after a 4 minute delay. Mild disinhibition noted.  Cranial Nerves:  II:  Visual fields intact. PERRL.  III,IV, VI: ptosis not present,  horizontal and vertical visual pursuits are conjugate and full, with saccadic quality to pursuits noted.  V,VII: smile symmetric, facial temp sensation normal bilaterally VIII: hearing intact to conversation IX,X: palate rises symmetrically XI: bilateral shoulder shrug symmetric XII: midline tongue extension Motor: Right : Upper extremity   5/5    Left:     Upper extremity   5/5  Lower extremity   5/5     Lower extremity   5/5 Normal tone throughout; no atrophy noted Sensory: Temp and light touch intact in all 4 extremities proximally. Mild temperature sensation loss to feet.  Deep Tendon Reflexes:  Normoactive x 4.  Plantars: Mute bilaterally Cerebellar: No ataxia with FNF bilaterally Gait: Narrow based, with mild shuffling quality. Able to stand with own power.   Results for orders placed or performed during the hospital encounter of 12/16/16 (from the past 48 hour(s))  CBG monitoring, ED     Status: Abnormal   Collection Time: 12/16/16  5:03 PM  Result Value Ref Range   Glucose-Capillary 114 (H) 65 - 99 mg/dL   Comment 1 Notify RN    Comment 2 Document in Chart   Protime-INR     Status: None   Collection Time: 12/16/16  5:06 PM  Result Value Ref Range   Prothrombin Time 12.7 11.4 - 15.2 seconds   INR 0.96   APTT     Status: None   Collection Time: 12/16/16  5:06 PM  Result Value Ref Range   aPTT 30 24 - 36 seconds  CBC     Status: None   Collection Time: 12/16/16  5:06 PM  Result Value Ref Range   WBC 8.5 4.0 - 10.5 K/uL   RBC 5.00 3.87 - 5.11 MIL/uL   Hemoglobin 14.8 12.0 - 15.0 g/dL   HCT 45.0 36.0 - 46.0 %   MCV 90.0 78.0 - 100.0 fL   MCH 29.6 26.0 - 34.0 pg   MCHC 32.9 30.0 - 36.0 g/dL   RDW 12.9 11.5 - 15.5 %   Platelets 195 150 - 400 K/uL  Differential     Status: None   Collection Time: 12/16/16  5:06 PM  Result Value Ref Range   Neutrophils Relative % 49 %   Neutro Abs 4.1 1.7 - 7.7 K/uL   Lymphocytes Relative 44 %   Lymphs Abs 3.8 0.7 - 4.0 K/uL    Monocytes Relative 5 %   Monocytes Absolute 0.5 0.1 - 1.0 K/uL   Eosinophils Relative  2 %   Eosinophils Absolute 0.2 0.0 - 0.7 K/uL   Basophils Relative 0 %   Basophils Absolute 0.0 0.0 - 0.1 K/uL  Comprehensive metabolic panel     Status: Abnormal   Collection Time: 12/16/16  5:06 PM  Result Value Ref Range   Sodium 139 135 - 145 mmol/L   Potassium 4.0 3.5 - 5.1 mmol/L   Chloride 105 101 - 111 mmol/L   CO2 25 22 - 32 mmol/L   Glucose, Bld 121 (H) 65 - 99 mg/dL   BUN 14 6 - 20 mg/dL   Creatinine, Ser 0.94 0.44 - 1.00 mg/dL   Calcium 9.0 8.9 - 10.3 mg/dL   Total Protein 6.1 (L) 6.5 - 8.1 g/dL   Albumin 3.9 3.5 - 5.0 g/dL   AST 21 15 - 41 U/L   ALT 16 14 - 54 U/L   Alkaline Phosphatase 73 38 - 126 U/L   Total Bilirubin 0.3 0.3 - 1.2 mg/dL   GFR calc non Af Amer 54 (L) >60 mL/min   GFR calc Af Amer >60 >60 mL/min    Comment: (NOTE) The eGFR has been calculated using the CKD EPI equation. This calculation has not been validated in all clinical situations. eGFR's persistently <60 mL/min signify possible Chronic Kidney Disease.    Anion gap 9 5 - 15  I-stat troponin, ED     Status: None   Collection Time: 12/16/16  5:14 PM  Result Value Ref Range   Troponin i, poc 0.06 0.00 - 0.08 ng/mL   Comment 3            Comment: Due to the release kinetics of cTnI, a negative result within the first hours of the onset of symptoms does not rule out myocardial infarction with certainty. If myocardial infarction is still suspected, repeat the test at appropriate intervals.   I-Stat Chem 8, ED     Status: Abnormal   Collection Time: 12/16/16  5:16 PM  Result Value Ref Range   Sodium 138 135 - 145 mmol/L   Potassium 4.1 3.5 - 5.1 mmol/L   Chloride 103 101 - 111 mmol/L   BUN 20 6 - 20 mg/dL   Creatinine, Ser 0.90 0.44 - 1.00 mg/dL   Glucose, Bld 122 (H) 65 - 99 mg/dL   Calcium, Ion 1.09 (L) 1.15 - 1.40 mmol/L   TCO2 30 0 - 100 mmol/L   Hemoglobin 15.0 12.0 - 15.0 g/dL   HCT 44.0 36.0  - 46.0 %   Dg Ribs Unilateral W/chest Right  Result Date: 12/16/2016 CLINICAL DATA:  Acute right chest and rib pain following fall yesterday. Initial encounter. EXAM: RIGHT RIBS AND CHEST - 3+ VIEW COMPARISON:  06/09/2015 and prior radiographs FINDINGS: Cardiomegaly again identified. There is no evidence of focal airspace disease, pulmonary edema, suspicious pulmonary nodule/mass, pleural effusion, or pneumothorax. No acute bony abnormalities are identified. Remote right rib fractures are identified. IMPRESSION: Cardiomegaly without evidence of acute cardiopulmonary disease. No evidence of acute rib fracture. Multiple remote right rib fractures. Electronically Signed   By: Margarette Canada M.D.   On: 12/16/2016 19:36   Ct Head Wo Contrast  Result Date: 12/16/2016 CLINICAL DATA:  Initial evaluation for acute slurred speech, dizziness, headaches. EXAM: CT HEAD WITHOUT CONTRAST TECHNIQUE: Contiguous axial images were obtained from the base of the skull through the vertex without intravenous contrast. COMPARISON:  None available. FINDINGS: Brain: Diffuse prominence of the CSF containing spaces is compatible with generalized cerebral atrophy. Patchy and  confluent hypodensity within the periventricular and deep white matter both cerebral hemispheres most consistent with chronic small vessel ischemic disease. Superimposed remote lacunar infarct present within the left lentiform nucleus. No acute intracranial hemorrhage. No evidence for acute large vessel territory infarct. No mass lesion, midline shift or mass effect. No hydrocephalus. No extra-axial fluid collection. Vascular: No asymmetric hyperdense vessel. Vessels are somewhat diffusely dense, suggesting underlying dehydration. Scattered atherosclerosis noted within the carotid siphons. Skull: Scalp soft tissues within normal limits.  Calvarium intact. Sinuses/Orbits: Globes and orbital soft tissues within normal limits. Patient status post lens extraction  bilaterally. Paranasal sinuses are clear. No mastoid effusion. 1. No acute intracranial process identified. 2. Remote left basal ganglia lacunar infarct. 3. Generalized cerebral atrophy with moderate chronic microvascular disease. Electronically Signed   By: Jeannine Boga M.D.   On: 12/16/2016 17:14    Assessment: 83 y.o. female with transient speech deficit and difficulty ambulating 1. DDx includes TIA and cognitive fluctuation given signs on exam of MCI or mild dementia. DDx for the latter includes Lewy body dementia, although she denies visual hallucinations or thrashing in her sleep. Hypertensive encephalopathy also on DDx regarding her transient speech deficit 2. History of breast cancer. 3. Anxiety. 4. Severely elevated BP during ED evaluation 5. Stroke Risk Factors - HTN 6. Chronic low back pain. Contributes to her ambulatory problems and is the reason for her PT, per patient  Plan: 1. HgbA1c, fasting lipid panel 2. MRI, MRA of the brain without contrast 3. MRA of neck 4. Echocardiogram 5. Start ASA 81 mg po qd, first dose now 6. Hold off on statin pending results of stroke work up 7. Telemetry monitoring 8. Frequent neuro checks 9. PT consult, OT consult, Speech consult 10. Modified permissive HTN goal given possibility of malignant HTN as an alternate etiology for her presentation: SBP goal of < 180 11. If work up negative for stroke or structural risk factors for stroke, she should be scheduled for an outpatient dementia evaluation with [facility].    @Electronically  signed: Dr. Kerney Elbe  12/16/2016, 8:26 PM

## 2016-12-16 NOTE — ED Notes (Signed)
ED Provider at bedside. 

## 2016-12-17 ENCOUNTER — Observation Stay (HOSPITAL_BASED_OUTPATIENT_CLINIC_OR_DEPARTMENT_OTHER): Payer: Medicare Other

## 2016-12-17 DIAGNOSIS — I6789 Other cerebrovascular disease: Secondary | ICD-10-CM | POA: Diagnosis not present

## 2016-12-17 DIAGNOSIS — G459 Transient cerebral ischemic attack, unspecified: Secondary | ICD-10-CM | POA: Diagnosis not present

## 2016-12-17 DIAGNOSIS — I10 Essential (primary) hypertension: Secondary | ICD-10-CM | POA: Diagnosis not present

## 2016-12-17 DIAGNOSIS — R471 Dysarthria and anarthria: Secondary | ICD-10-CM | POA: Diagnosis not present

## 2016-12-17 LAB — ECHOCARDIOGRAM COMPLETE
EERAT: 14.01
FS: 22 % — AB (ref 28–44)
IV/PV OW: 0.97
LA diam end sys: 38 mm
LA diam index: 2.15 cm/m2
LA vol A4C: 25.7 ml
LA vol index: 19.8 mL/m2
LA vol: 35.1 mL
LASIZE: 38 mm
LDCA: 2.54 cm2
LV E/e' medial: 14.01
LVEEAVG: 14.01
LVOT SV: 37 mL
LVOT VTI: 14.6 cm
LVOT diameter: 18 mm
LVOT peak vel: 82.9 cm/s
Lateral S' vel: 8.59 cm/s
MVPKAVEL: 113 m/s
MVPKEVEL: 58.7 m/s
PW: 11.5 mm — AB (ref 0.6–1.1)
RV TAPSE: 11.8 mm
TDI e' medial: 4.19

## 2016-12-17 LAB — LIPID PANEL
CHOL/HDL RATIO: 5.5 ratio
Cholesterol: 181 mg/dL (ref 0–200)
HDL: 33 mg/dL — AB (ref >40)
LDL Cholesterol: 109 mg/dL — ABNORMAL HIGH (ref 0–99)
Triglycerides: 194 mg/dL — ABNORMAL HIGH (ref <150)
VLDL: 39 mg/dL (ref 0–40)

## 2016-12-17 MED ORDER — HYDROXYZINE HCL 25 MG PO TABS
25.0000 mg | ORAL_TABLET | Freq: Every day | ORAL | Status: DC
  Administered 2016-12-17: 25 mg via ORAL
  Filled 2016-12-17: qty 1

## 2016-12-17 MED ORDER — PRAVASTATIN SODIUM 40 MG PO TABS: 40.0000 mg | ORAL_TABLET | Freq: Every day | ORAL | 0 refills | Status: DC

## 2016-12-17 MED ORDER — ASPIRIN 81 MG PO CHEW: 81.0000 mg | CHEWABLE_TABLET | Freq: Every day | ORAL | 0 refills | Status: DC

## 2016-12-17 MED ORDER — GADOBENATE DIMEGLUMINE 529 MG/ML IV SOLN
15.0000 mL | Freq: Once | INTRAVENOUS | Status: AC | PRN
  Administered 2016-12-17: 15 mL via INTRAVENOUS

## 2016-12-17 MED ORDER — PRAVASTATIN SODIUM 40 MG PO TABS
40.0000 mg | ORAL_TABLET | Freq: Every day | ORAL | Status: DC
Start: 2016-12-17 — End: 2016-12-17

## 2016-12-17 NOTE — Discharge Summary (Addendum)
Physician Discharge Summary  [name] GEX:[id number] DOB: April 25, 1930 DOA: 12/16/2016  PCP: Kandice Hams, MD  Admit date: 12/16/2016 Discharge date: 12/17/2016  Admitted From:home Disposition:home with home care services  Recommendations for Outpatient Follow-up:  1. Follow up with PCP in 1-2 weeks 2. Please obtain BMP/CBC in one week  Home Health:yes Equipment/Devices:no Discharge Condition:stable CODE STATUS:full code Diet recommendation:heart healthy  Brief/Interim Summary: 83 year old female with history of hypertension, anxiety depression, chronic back presented to the hospital for the evaluation of weakness and slurry speech. In the ED patient was found to have hypertensive with systolic blood pressure 010U. Imaging studies including CT scan of head, MRI brain, MRA brain with no acute finding consistent with chronic microvascular ischemic disease. Patient's symptoms likely due to transient ischemic attack. LDL 109, A1c pending. Started on aspirin and statin. Patient is very eager to go home today and asking for discharge as soon as possible. I discussed with Dr. Erlinda Hong from neurologist and reviewed the imaging studies and lab. He recommended that patient can be discharged home with aspirin and statin and home medications with outpatient follow-up in neurology clinic. Neurology was referred. Patient was recommended to follow-up with her PCP. Home care services including physical therapy, home health aide and visiting nurse ordered. Echo is pending at this time. This will be reviewed before patient is discharged from the hospital. I discussed with the patient's nurse.  On discharge patient has no slurred speech or any focal neurological deficit. She has no weakness in upper and lower extremities and his speech is clear.  Discharge Diagnoses:  Principal Problem:   TIA (transient ischemic attack) Active Problems:   Anxiety state   Essential hypertension   Dysarthria   Leg  weakness   EKG abnormalities   Chronic pain    Discharge Instructions  Discharge Instructions    Ambulatory referral to Neurology    Complete by:  As directed    Follow up with Dr. Tomi Likens at [facility] in 6 weeks. Thanks.   Call MD for:  difficulty breathing, headache or visual disturbances    Complete by:  As directed    Call MD for:  extreme fatigue    Complete by:  As directed    Call MD for:  hives    Complete by:  As directed    Call MD for:  persistant dizziness or light-headedness    Complete by:  As directed    Call MD for:  persistant nausea and vomiting    Complete by:  As directed    Call MD for:  severe uncontrolled pain    Complete by:  As directed    Call MD for:  temperature >100.4    Complete by:  As directed    Diet - low sodium heart healthy    Complete by:  As directed    Discharge instructions    Complete by:  As directed    Please follow up with neurologist.   Increase activity slowly    Complete by:  As directed      Allergies as of 12/17/2016      Reactions   Anectine [succinylcholine Chloride] Other (See Comments)   Reaction:  Patient states that her body wasn't functioning.  Pt states she was put on life support.   Dilaudid [hydromorphone Hcl] Other (See Comments)   Slept for 3 days and 3 nights   Hydrocodone-acetaminophen Other (See Comments)    Hallucinations, paranoia, amnesia   Iodine Hives, Other (See Comments)   ,  asthma   Naproxen Sodium Other (See Comments)   REACTION: Asthma   Oxycodone Hcl Other (See Comments)    Hallucinations, paranoia, amnesia   Pentosan Polysulfate Sodium Other (See Comments)   REACTION: unspecified per patient   Pseudoephedrine Other (See Comments)    Palpitations   Succinylcholine    REACTION: "Can't wake up".   Tape Dermatitis   Red inflammed   Tramadol Hcl Other (See Comments)   Unknown reaction   Shellfish Allergy    Runny nose, cough, hoarseness      Medication List    TAKE these medications    acetaminophen 500 MG tablet Commonly known as:  TYLENOL Take 1,000 mg by mouth every 6 (six) hours as needed for mild pain.   aspirin 81 MG chewable tablet Chew 1 tablet (81 mg total) by mouth daily. Start taking on:  12/18/2016   buPROPion 300 MG 24 hr tablet Commonly known as:  WELLBUTRIN XL Take 300 mg by mouth daily.   CALCIUM-MAGNESIUM PO Take 1 tablet by mouth daily.   carisoprodol 350 MG tablet Commonly known as:  SOMA Take 350 mg by mouth at bedtime as needed for muscle spasms. What changed:  Another medication with the same name was changed. Make sure you understand how and when to take each.   carisoprodol 250 MG tablet Commonly known as:  SOMA Take 1 tablet (250 mg total) by mouth daily as needed (Pt is also on the 350 mg Soma.). For muscle spasms. What changed:  reasons to take this  additional instructions   celecoxib 200 MG capsule Commonly known as:  CELEBREX Take 1 capsule (200 mg total) by mouth daily. What changed:  when to take this  reasons to take this   CYSTEX PO Take 1 tablet by mouth daily as needed (urinary tract pain).   diazepam 10 MG tablet Commonly known as:  VALIUM Take 10 mg by mouth daily as needed for anxiety.   estradiol 0.1 MG/GM vaginal cream Commonly known as:  ESTRACE Place 1 Applicatorful vaginally daily as needed (for dryness).   fluticasone 50 MCG/ACT nasal spray Commonly known as:  FLONASE Place 2 sprays into both nostrils daily. What changed:  when to take this  reasons to take this   GELUSIL PO Take 1 tablet by mouth daily as needed (acid reflux).   hydrOXYzine 25 MG capsule Commonly known as:  VISTARIL Take 25 mg by mouth at bedtime.   multivitamin with minerals Tabs tablet Take 1 tablet by mouth daily as needed (when not eating well).   oxyCODONE-acetaminophen 10-325 MG tablet Commonly known as:  PERCOCET Take 1 tablet by mouth daily as needed for pain.   pravastatin 40 MG tablet Commonly known as:   PRAVACHOL Take 1 tablet (40 mg total) by mouth daily at 6 PM.   RECLAST IV Inject into the vein. Yearly injection - last injection March 2018   VITAMIN B-12 PO Take 1 tablet by mouth daily.   VITAMIN C PO Take 1 tablet by mouth daily as needed (when not eating enough fruit).   Vitamin D3 1000 units Caps Take 1,000 Units by mouth daily.   VITAMIN E PO Take 1 capsule by mouth daily.      Follow-up Information    POLITE,RONALD D, MD. Schedule an appointment as soon as possible for a visit in 1 week(s).   Specialty:  Internal Medicine Contact information: [address] [place] [address] [phone]  Dudley Major, DO. Schedule an appointment as soon as possible for a visit in 6 week(s).   Specialty:  Neurology Contact information: [address] [place] [address] [phone]          Allergies  Allergen Reactions  . Anectine [Succinylcholine Chloride] Other (See Comments)    Reaction:  Patient states that her body wasn't functioning.  Pt states she was put on life support.   . Dilaudid [Hydromorphone Hcl] Other (See Comments)    Slept for 3 days and 3 nights  . Hydrocodone-Acetaminophen Other (See Comments)     Hallucinations, paranoia, amnesia  . Iodine Hives and Other (See Comments)    , asthma  . Naproxen Sodium Other (See Comments)    REACTION: Asthma  . Oxycodone Hcl Other (See Comments)     Hallucinations, paranoia, amnesia  . Pentosan Polysulfate Sodium Other (See Comments)    REACTION: unspecified per patient  . Pseudoephedrine Other (See Comments)     Palpitations  . Succinylcholine     REACTION: "Can't wake up".  . Tape Dermatitis    Red inflammed  . Tramadol Hcl Other (See Comments)    Unknown reaction  . Shellfish Allergy     Runny nose, cough, hoarseness    Consultations: Neurologist  Procedures/Studies: CT scan, MRI, echo  Subjective: Patient was seen and examined at bedside.  She is asking to be discharged as soon as possible. Reported that she is feeling much better and can follow-up with her doctors outpatient. Denied headache, dizziness, nausea, vomiting, chest pain, shortness of breath. Her speech is clear.  Discharge Exam: Vitals:   12/17/16 0613 12/17/16 0906  BP: (!) 155/68 (!) 169/73  Pulse: 95 97  Resp: 20 20  Temp: 98.8 F (37.1 C) 98.5 F (36.9 C)   Vitals:   12/17/16 0223 12/17/16 0424 12/17/16 0613 12/17/16 0906  BP: (!) 171/63 (!) 149/69 (!) 155/68 (!) 169/73  Pulse: 90 90 95 97  Resp: 20 18 20 20   Temp: 98.1 F (36.7 C) 98.3 F (36.8 C) 98.8 F (37.1 C) 98.5 F (36.9 C)  TempSrc: Oral Oral Oral Oral  SpO2: 97% 94% 96% 94%    General: Pt is alert, awake, not in acute distress Cardiovascular: RRR, S1/S2 +, no rubs, no gallops Respiratory: CTA bilaterally, no wheezing, no rhonchi Abdominal: Soft, NT, ND, bowel sounds + Extremities: no edema, no cyanosis    The results of significant diagnostics from this hospitalization (including imaging, microbiology, ancillary and laboratory) are listed below for reference.     Microbiology: No results found for this or any previous visit (from the past 240 hour(s)).   Labs: BNP (last 3 results) No results for input(s): BNP in the last 8760 hours. Basic Metabolic Panel:  Recent Labs Lab 12/16/16 1706 12/16/16 1716  NA 139 138  K 4.0 4.1  CL 105 103  CO2 25  --   GLUCOSE 121* 122*  BUN 14 20  CREATININE 0.94 0.90  CALCIUM 9.0  --    Liver Function Tests:  Recent Labs Lab 12/16/16 1706  AST 21  ALT 16  ALKPHOS 73  BILITOT 0.3  PROT 6.1*  ALBUMIN 3.9   No results for input(s): LIPASE, AMYLASE in the last 168 hours. No results for input(s): AMMONIA in the last 168 hours. CBC:  Recent Labs Lab 12/16/16 1706 12/16/16 1716  WBC 8.5  --   NEUTROABS 4.1  --   HGB 14.8 15.0  HCT 45.0  44.0  MCV 90.0  --   PLT 195  --    Cardiac Enzymes: No results for input(s):  CKTOTAL, CKMB, CKMBINDEX, TROPONINI in the last 168 hours. BNP: Invalid input(s): POCBNP CBG:  Recent Labs Lab 12/16/16 1703  GLUCAP 114*   D-Dimer No results for input(s): DDIMER in the last 72 hours. Hgb A1c No results for input(s): HGBA1C in the last 72 hours. Lipid Profile  Recent Labs  12/17/16 0459  CHOL 181  HDL 33*  LDLCALC 109*  TRIG 194*  CHOLHDL 5.5   Thyroid function studies No results for input(s): TSH, T4TOTAL, T3FREE, THYROIDAB in the last 72 hours.  Invalid input(s): FREET3 Anemia work up No results for input(s): VITAMINB12, FOLATE, FERRITIN, TIBC, IRON, RETICCTPCT in the last 72 hours. Urinalysis    Component Value Date/Time   COLORURINE YELLOW 07/16/2013 1421   APPEARANCEUR CLOUDY (A) 07/16/2013 1421   LABSPEC 1.011 07/16/2013 1421   PHURINE 8.0 07/16/2013 1421   GLUCOSEU NEGATIVE 07/16/2013 1421   GLUCOSEU NEGATIVE 10/14/2006 0836   HGBUR TRACE (A) 07/16/2013 1421   HGBUR trace-lysed 08/07/2008 0922   BILIRUBINUR n 10/02/2013 1123   KETONESUR NEGATIVE 07/16/2013 1421   PROTEINUR n 10/02/2013 1123   PROTEINUR NEGATIVE 07/16/2013 1421   UROBILINOGEN 0.2 10/02/2013 1123   UROBILINOGEN 0.2 07/16/2013 1421   NITRITE n 10/02/2013 1123   NITRITE NEGATIVE 07/16/2013 1421   LEUKOCYTESUR small (1+) 10/02/2013 1123   Sepsis Labs Invalid input(s): PROCALCITONIN,  WBC,  LACTICIDVEN Microbiology No results found for this or any previous visit (from the past 240 hour(s)).   Time coordinating discharge: 26 minutes  SIGNED:   Rosita Fire, MD  [facility] 12/17/2016, 4:23 PM  If 7PM-7AM, please contact night-coverage [email or web address] Password [email or web address]

## 2016-12-17 NOTE — Care Management Note (Addendum)
Case Management Note  Patient Details  Name: [name] MRN: [id number] Date of Birth: 11-08-1929  Subjective/Objective:                    Action/Plan: Pt discharging home with orders for [facility] services. CM met with the patient and her caregiver (with patient 4 days a week for 6 hours at a time) and provided a list of [place] agencies. She selected [facility] [place]. Brad with [facility] notified and accepted the referral.  Per caregiver pt has cane, walker, bar on her bed and 3 in 1 at home.  Pt has transportation home.   Expected Discharge Date:  12/17/16               Expected Discharge Plan:  [place]  In-House Referral:     Discharge planning Services  CM Consult  Post Acute Care Choice:  Home Health Choice offered to:  Patient  DME Arranged:    DME Agency:     HH Arranged:  RN, PT, OT, Nurse's Aide [place] Agency:  [facility]  Status of Service:  Completed, signed off  If discussed at [place] of Stay Meetings, dates discussed:    Additional Comments:  Pollie Friar, RN 12/17/2016, 3:08 PM

## 2016-12-17 NOTE — Evaluation (Signed)
Physical Therapy Evaluation Patient Details Name: [name] MRN: [id number] DOB: Jan 10, 1930 Today's Date: 12/17/2016   History of Present Illness   83 y.o. female with medical history significant for untreated hypertension, depression, anxiety, and chronic back pain who presents to the emergency department for evaluation of weakness and slurred speech. Complains of right flank pain from recent fall while pulling weeds in the garden. MRA findings include: Chronic microvascular ischemia without acute intracranial. Echo pending.  Clinical Impression  Patient presents with decreased safety and independence with mobility with LE weakness, pain and recent fall, at risk for further falls.  Discussed with pt concern for home alone and initially recommended SNF, but she refused stating needs to be home with her dog.  Educated in importance of not traversing stairs alone and not to sit in lower seats in the home as well as to not use her garden tub.  She has her niece 6 hours 4 days a week and would benefit from max [facility] services as well.  Will follow until d/c.    Follow Up Recommendations Home health PT;Supervision - Intermittent ([place] aide)    Equipment Recommendations  None recommended by PT    Recommendations for Other Services       Precautions / Restrictions Precautions Precautions: Fall Restrictions Weight Bearing Restrictions: No Other Position/Activity Restrictions: chronic back pain      Mobility  Bed Mobility Overal bed mobility: Needs Assistance Bed Mobility: Supine to Sit;Sit to Supine     Supine to sit: Min assist Sit to supine: Supervision   General bed mobility comments: assist for scooting to EOB, assist for positioning in supine for comfort  Transfers Overall transfer level: Needs assistance Equipment used: Rolling walker (2 wheeled) Transfers: Sit to/from Stand Sit to Stand: Min guard         General transfer comment: for safety from EOB, pt standing  from 3:1 in bathroom on her own  Ambulation/Gait Ambulation/Gait assistance: Min guard Ambulation Distance (Feet): 200 Feet (& 150') Assistive device: Rolling walker (2 wheeled) Gait Pattern/deviations: Step-to pattern;Trunk flexed;Shuffle;Decreased stride length;Decreased dorsiflexion - right;Decreased dorsiflexion - left     General Gait Details: shuffling feet with flexed posture, cues and assist for improved upright posture with COG over BOS, but does not last  Stairs Stairs: Yes Stairs assistance: Min assist Stair Management: Two rails;Step to pattern;Forwards Number of Stairs: 2 General stair comments: uncontrolled descent due to pt c/o pain in legs to descend stairs  Wheelchair Mobility    Modified Rankin (Stroke Patients Only)       Balance Overall balance assessment: Needs assistance   Sitting balance-Leahy Scale: Good     Standing balance support: During functional activity Standing balance-Leahy Scale: Fair Standing balance comment: washed hands without UE support                             Pertinent Vitals/Pain Pain Assessment: Faces Faces Pain Scale: Hurts even more Pain Location: legs from trying to get up Pain Descriptors / Indicators: Aching;Grimacing;Sore;Guarding Pain Intervention(s): Monitored during session;Repositioned    Home Living Family/patient expects to be discharged to:: Private residence Living Arrangements: Alone Available Help at Discharge: Neighbor;Available PRN/intermittently;Family Type of Home: House Home Access: Stairs to enter Entrance Stairs-Rails: Psychiatric nurse of Steps: 3 Home Layout: Two level;Able to live on main level with bedroom/bathroom Home Equipment: Shower seat - built in;Walker - 4 wheels      Prior Function Level of  Independence: Independent;Independent with assistive device(s)         Comments: reports that she drives to therapy; uses rollator     Hand Dominance         Extremity/Trunk Assessment   Upper Extremity Assessment Upper Extremity Assessment: Defer to OT evaluation    Lower Extremity Assessment Lower Extremity Assessment: RLE deficits/detail;LLE deficits/detail RLE Deficits / Details: AROM WFL, strength grossly 4/5 but painful due to soreness pt reports from trying to get up from fall LLE Deficits / Details: AROM WFL, strength grossly 4/5 but painful due to soreness pt reports from trying to get up from fall    Cervical / Trunk Assessment Cervical / Trunk Assessment: Kyphotic;Lordotic  Communication   Communication: No difficulties  Cognition Arousal/Alertness: Awake/alert Behavior During Therapy: Anxious Overall Cognitive Status: No family/caregiver present to determine baseline cognitive functioning                                 General Comments: decreased safety awareness and problem solving      General Comments General comments (skin integrity, edema, etc.): Discussed not traversing stairs for freezer and to plan ahead to get items when her neice is there; also about not using whirlpool unless dangling legs in from outside ledge    Exercises     Assessment/Plan    PT Assessment Patient needs continued PT services  PT Problem List Decreased strength;Decreased balance;Decreased knowledge of use of DME;Decreased safety awareness;Decreased activity tolerance;Pain       PT Treatment Interventions DME instruction;Gait training;Stair training;Balance training;Functional mobility training;Therapeutic exercise;Patient/family education;Therapeutic activities    PT Goals (Current goals can be found in the Care Plan section)  Acute Rehab PT Goals Patient Stated Goal: home ASAP PT Goal Formulation: With patient Time For Goal Achievement: 12/17/16 Potential to Achieve Goals: Good    Frequency Min 3X/week   Barriers to discharge Decreased caregiver support      Co-evaluation               End of  Session Equipment Utilized During Treatment: Gait belt Activity Tolerance: Patient tolerated treatment well Patient left: in bed;with call bell/phone within reach;with family/visitor present;with bed alarm set   PT Visit Diagnosis: Other abnormalities of gait and mobility (R26.89);History of falling (Z91.81);Pain Pain - Right/Left: Right Pain - part of body: Leg    Time: 0165-5374 PT Time Calculation (min) (ACUTE ONLY): 24 min   Charges:   PT Evaluation $PT Eval Moderate Complexity: 1 Procedure PT Treatments $Gait Training: 8-22 mins   PT G Codes:   PT G-Codes **NOT FOR INPATIENT CLASS** Functional Assessment Tool Used: AM-PAC 6 Clicks Basic Mobility Functional Limitation: Mobility: Walking and moving around Mobility: Walking and Moving Around Current Status (M2707): At least 40 percent but less than 60 percent impaired, limited or restricted Mobility: Walking and Moving Around Goal Status [phone]): At least 20 percent but less than 40 percent impaired, limited or restricted    [place], Walker 12/17/2016   Reginia Naas 12/17/2016, 12:28 PM

## 2016-12-17 NOTE — Progress Notes (Signed)
STROKE TEAM PROGRESS NOTE   HISTORY OF PRESENT ILLNESS (per record) [name] is an 83 y.o. female who presents with stroke like symptoms. LKN was yesterday night prior to going to bed. On awakening this AM, she had difficulty getting herself out of bed and then noticed that she had trouble walking. Later, at her PT appointment, it was noticed that her speech was slurred. She was then taken to the ED by her family. She feels that her speech has improved since she left PT. She did fall outside yesterday while gardening.   She has no prior history of stroke or MI. She does not take an antiplatelet medication and is not on anticoagulation.   LSN: Yesterday night tPA Given: No: Out of time window   SUBJECTIVE (INTERVAL HISTORY) Her daughter and son are at the bedside.  Pt is waking with walker in room preparing to be discharged. She agrees to take ASA and check BP at home.    OBJECTIVE Temp:  [97.8 F (36.6 C)-98.8 F (37.1 C)] 98.5 F (36.9 C) (04/27 0906) Pulse Rate:  [81-97] 97 (04/27 0906) Cardiac Rhythm: Normal sinus rhythm (04/27 0754) Resp:  [15-20] 20 (04/27 0906) BP: (149-209)/(63-102) 169/73 (04/27 0906) SpO2:  [94 %-97 %] 94 % (04/27 0906)  CBC:   Recent Labs Lab 12/16/16 1706 12/16/16 1716  WBC 8.5  --   NEUTROABS 4.1  --   HGB 14.8 15.0  HCT 45.0 44.0  MCV 90.0  --   PLT 195  --     Basic Metabolic Panel:   Recent Labs Lab 12/16/16 1706 12/16/16 1716  NA 139 138  K 4.0 4.1  CL 105 103  CO2 25  --   GLUCOSE 121* 122*  BUN 14 20  CREATININE 0.94 0.90  CALCIUM 9.0  --     Lipid Panel:     Component Value Date/Time   CHOL 181 12/17/2016 0459   TRIG 194 (H) 12/17/2016 0459   HDL 33 (L) 12/17/2016 0459   CHOLHDL 5.5 12/17/2016 0459   VLDL 39 12/17/2016 0459   LDLCALC 109 (H) 12/17/2016 0459   HgbA1c:  Lab Results  Component Value Date   HGBA1C 6.4 01/29/2015   Urine Drug Screen: No results found for: LABOPIA, COCAINSCRNUR,  LABBENZ, AMPHETMU, THCU, LABBARB  Alcohol Level No results found for: [place] I have personally reviewed the radiological images below and agree with the radiology interpretations.  Ct Head Wo Contrast 12/16/2016 1. No acute intracranial process identified.  2. Remote left basal ganglia lacunar infarct.  3. Generalized cerebral atrophy with moderate chronic microvascular disease.   Mri and Mra Head Wo Contrast Mr Jodene Nam Neck W Wo Contrast 12/17/2016 1. Chronic microvascular ischemia without acute intracranial abnormality.  2. No emergent large vessel occlusion. Multifocal moderate to severe atherosclerotic stenosis of the left MCA M2 branches, right PCA P2 segment and left PCA P1 and P2 segments.  3. No hemodynamically significant carotid stenosis.  4. Poor visualization of the left vertebral artery origin and mild narrowing of the right V1 segment, but otherwise normal vertebral arteries and and their distal branches.   TTE pending   PHYSICAL EXAM Temp:  [97.8 F (36.6 C)-98.8 F (37.1 C)] 98.5 F (36.9 C) (04/27 0906) Pulse Rate:  [81-97] 97 (04/27 0906) Resp:  [15-20] 20 (04/27 0906) BP: (149-209)/(63-102) 169/73 (04/27 0906) SpO2:  [94 %-97 %] 94 % (04/27 0906)  General - Well nourished, well developed, in no apparent distress.  Ophthalmologic - Fundi not visualized due to noncooperation.  Cardiovascular - Regular rate and rhythm.  Mental Status -  Level of arousal and orientation to time, place, and person were intact. Language including expression, naming, repetition, comprehension was assessed and found intact. Fund of Knowledge was assessed and was intact.  Cranial Nerves II - XII - II - Visual field intact OU. III, IV, VI - Extraocular movements intact. V - Facial sensation intact bilaterally. VII - Facial movement intact bilaterally. VIII - Hearing & vestibular intact bilaterally. X - Palate elevates symmetrically. XI - Chin turning & shoulder shrug  intact bilaterally. XII - Tongue protrusion intact.  Motor Strength - The patient's strength was normal in all extremities and pronator drift was absent.  Bulk was normal and fasciculations were absent.   Motor Tone - Muscle tone was assessed at the neck and appendages and was normal.  Reflexes - The patient's reflexes were 1+ in all extremities and she had no pathological reflexes.  Sensory - Light touch, temperature/pinprick were assessed and were symmetrical.    Coordination - The patient had normal movements in the hands with no ataxia or dysmetria.  Tremor was absent.  Gait and Station - walk with walker in room, slow stride but steady.   ASSESSMENT/PLAN [name] is a 83 y.o. female with history of prior stroke, polycythemia, liver mass, spinal stenosis, hypertension, hyperlipidemia, anxiety, depression, and history of breast cancer, presenting with slurred speech and gait difficulties. She did not receive IV t-PA due to late presentation.  Possible TIA - small vessel disease source due to atherosclerosis  Resultant  Deficit resolved, pt denies any stroke like symptoms in the first place, she thought she was pulling weeds in garden and fell and that was it  CT - chronic left BG lacune  MRI - no acute intracranial abnormality.   MRA head and neck - diffuse moderate to severe atherosclerotic cerebrovascular disease, including moderate to severe atherosclerotic stenosis of the left MCA M2 branches, right PCA P2 segment and left PCA P1 and P2 segments.  2D Echo - pending  LDL - 109  HgbA1c - pending  VTE prophylaxis - Lovenox Diet Heart Room service appropriate? Yes; Fluid consistency: Thin  No antithrombotic prior to admission, now on aspirin 81 mg daily. Continue ASA on discharge.  Patient counseled to be compliant with her antithrombotic medications  Ongoing aggressive stroke risk factor management  Therapy recommendations: No OT follow-up  recommended.  Disposition: Pending  Hypertension  Stable  Permissive hypertension (OK if < 220/120) but gradually normalize in 5-7 days  Long-term BP goal normotensive  Hyperlipidemia  Home meds: No lipid lowering medications prior to admission.  LDL 109, goal < 70  Add Pravachol 40 mg daily  Continue statin at discharge  Other Stroke Risk Factors  Advanced age  The patient quit smoking 39 years ago.  Hx stroke/TIA by imaging  Estradiol therapy (vaginal cream)  Other Active Problems  Follows up with Dr. Tomi Likens at [facility] as [facility] day # 0  Neurology will sign off. Please call with questions. Pt will follow up with Dr. Tomi Likens at [facility] in about 6 weeks. Thanks for the consult.  Rosalin Hawking, MD PhD Stroke Neurology 12/17/2016 4:12 PM   To contact Stroke Continuity provider, please refer to [email or web address] After hours, contact General Neurology

## 2016-12-17 NOTE — Progress Notes (Signed)
  Echocardiogram 2D Echocardiogram has been performed.  [name] 12/17/2016, 3:30 PM

## 2016-12-17 NOTE — Care Management Obs Status (Signed)
[place] NOTIFICATION   Patient Details  Name: [name] MRN: [id number] Date of Birth: Jul 01, 1930   Medicare Observation Status Notification Given:  Yes    Pollie Friar, RN 12/17/2016, 2:08 PM

## 2016-12-17 NOTE — Evaluation (Addendum)
Occupational Therapy Evaluation Patient Details Name: [name] MRN: [id number] DOB: [birth year] Today's Date: 12/17/2016    History of Present Illness  83 y.o. female with medical history significant for untreated hypertension, depression, anxiety, and chronic back pain who presents to the emergency department for evaluation of weakness and slurred speech. Complains of right flank pain from recent fall while pulling weeds in the garden. MRA findings include: Chronic microvascular ischemia without acute intracranial. Echo pending.   Clinical Impression   Pt admitted with the above diagnoses and presents with below problem list. PTA pt was mod I with basic ADLs. Did experience a fall recently. Currently setup to min guard with OOB/mobility. Limited somewhat by chronic back and acute right flank pain (due to recent fall at home). Discussed at length fall prevention strategies during ADLs/functional mobility with pt with niece present. Pt exhibits some decreased safety awareness and problem solving, appears to be at baseline. Followed by OP PT. Will defer further therapy for balance and mobility to PT.  No further acute OT needs, OT signing off.    Follow Up Recommendations  No OT follow up;Supervision - Intermittent    Equipment Recommendations  None recommended by OT    Recommendations for Other Services       Precautions / Restrictions Precautions Precautions: Fall Restrictions Weight Bearing Restrictions: No Other Position/Activity Restrictions: chronic back pain      Mobility Bed Mobility Overal bed mobility: Modified Independent                Transfers Overall transfer level: Needs assistance Equipment used: Rolling walker (2 wheeled) Transfers: Sit to/from Stand Sit to Stand: Min guard         General transfer comment: from regular height surfaces, min guard with rw.    Balance Overall balance assessment: History of Falls;Needs assistance          Standing balance support: Bilateral upper extremity supported;During functional activity Standing balance-Leahy Scale: Poor Standing balance comment: walker at baseline                           ADL either performed or assessed with clinical judgement   ADL Overall ADL's : Needs assistance/impaired Eating/Feeding: Set up;Sitting   Grooming: Standing;Min guard   Upper Body Bathing: Set up;Sitting   Lower Body Bathing: Min guard;Sit to/from stand   Upper Body Dressing : Set up;Sitting   Lower Body Dressing: Min guard;Sit to/from stand   Toilet Transfer: Min guard;Ambulation;BSC;RW Toilet Transfer Details (indicate cue type and reason): transferred to low height toilet due to urgency with pt needing mod A to powerup. Pt reports this is lower height surface than any she normally does. Min guard from regular height surface. Placed 3n1 over toilet for future use in hospital. Toileting- Clothing Manipulation and Hygiene: Sitting/lateral lean;Set up   Tub/ Shower Transfer: Min guard;Ambulation;Rolling walker   Functional mobility during ADLs: Min guard;Rolling walker General ADL Comments: Pt with urgency to void upon OT arrival. In-room ambulation, toilet transfer and bed mobility completed as detailed above. Educated on fall prevention with niece present     Vision         Perception     Praxis      Pertinent Vitals/Pain Pain Assessment: Faces Faces Pain Scale: Hurts even more Pain Location: R flank during coughing, chronic back pain Pain Descriptors / Indicators: Aching;Grimacing;Sore;Guarding Pain Intervention(s): Limited activity within patient's tolerance;Monitored during session;Ice applied;Repositioned     Hand  Dominance     Extremity/Trunk Assessment Upper Extremity Assessment Upper Extremity Assessment: Overall WFL for tasks assessed   Lower Extremity Assessment Lower Extremity Assessment: Defer to PT evaluation   Cervical / Trunk  Assessment Cervical / Trunk Assessment: Kyphotic   Communication Communication Communication: No difficulties   Cognition Arousal/Alertness: Awake/alert Behavior During Therapy: Anxious (irritable ) Overall Cognitive Status:  (suspect cognitive impairments at baseline)                                 General Comments: decreased safety awareness and problem solving   General Comments       Exercises     Shoulder Instructions      Home Living Family/patient expects to be discharged to:: Private residence Living Arrangements: Alone Available Help at Discharge: Neighbor;Available PRN/intermittently Type of Home: House Home Access: Stairs to enter [employer] of Steps: 3 Entrance Stairs-Rails: Right;Left Home Layout: Two level;Able to live on main level with bedroom/bathroom     Bathroom Shower/Tub: Walk-in shower;Tub/shower unit;Other (comment) ("I like to get into my whirlpool.")   Bathroom Toilet: Standard     Home Equipment: Shower seat - built in;Walker - 4 wheels          Prior Functioning/Environment Level of Independence: Independent;Independent with assistive device(s)        Comments: reports that she drives to therapy; uses rollator        OT Problem List:        OT Treatment/Interventions:      OT Goals(Current goals can be found in the care plan section) Acute Rehab OT Goals Patient Stated Goal: home ASAP  OT Frequency:     Barriers to D/C:            Co-evaluation              End of Session Equipment Utilized During Treatment: Rolling walker  Activity Tolerance: Patient tolerated treatment well Patient left: in bed;with call bell/phone within reach;with bed alarm set;with family/visitor present  OT Visit Diagnosis: Unsteadiness on feet (R26.81);Pain Pain - Right/Left: Right Pain - part of body:  (flank, chronic back)                Time: 0930-1000 OT Time Calculation (min): 30 min Charges:  OT  General Charges $OT Visit: 1 Procedure OT Evaluation $OT Eval Low Complexity: 1 Procedure OT Treatments $Self Care/Home Management : 8-22 mins G-Codes: OT G-codes **NOT FOR INPATIENT CLASS** Functional Assessment Tool Used: AM-PAC 6 Clicks Daily Activity Functional Limitation: Self care Self Care Current Status (O0370): At least 1 percent but less than 20 percent impaired, limited or restricted     Clover Mealy OTR/L Pager: [phone]  12/17/2016, 10:38 AM

## 2016-12-17 NOTE — Evaluation (Signed)
Speech Language Pathology Evaluation Patient Details Name: [name] MRN: [id number] DOB: 24-Mar-1930 Today's Date: 12/17/2016 Time: 5537-4827 SLP Time Calculation (min) (ACUTE ONLY): 27 min  Problem List:  Patient Active Problem List   Diagnosis Date Noted  . TIA (transient ischemic attack) 12/16/2016  . Dysarthria 12/16/2016  . Leg weakness 12/16/2016  . EKG abnormalities 12/16/2016  . Chronic pain 12/16/2016  . Elevated blood pressure 12/22/2014  . Internal hemorrhoids with bleeding and prolapse 11/04/2014  . Lactose intolerance 11/04/2014  . Hemorrhoid 09/11/2014  . Rectal bleeding 09/11/2014  . IBS (irritable bowel syndrome) 09/25/2013  . Hyperparathyroidism, primary ([place]) 05/26/2011  . Osteoporosis 05/09/2009  . POLYCYTHEMIA 04/23/2009  . IMPAIRED FASTING GLUCOSE 11/14/2008  . BREAST CANCER, HX OF 12/06/2007  . Anxiety state 10/13/2007  . Essential hypertension 10/13/2007  . INTERSTITIAL CYSTITIS 09/06/2007  . HYPERLIPIDEMIA NEC/NOS 03/01/2007  . Backache 03/01/2007   Past Medical History:  Past Medical History:  Diagnosis Date  . Adenomatous colon polyp 04/2005  . Allergic    Anectine  . ANXIETY 10/13/2007  . BACK PAIN 03/01/2007  . BREAST CANCER, HX OF 12/06/2007  . Cancer [facility])    RIGHT mastectomy with node dissection  . Complication of anesthesia    family aggergy to annectine  . Compression fracture of L4 lumbar vertebra ([facility]) 02/2013  . CONSTIPATION 01/06/2009  . DEPRESSION 10/13/2007  . Diverticular disease   . DIVERTICULOSIS, COLON 05/11/2005  . External hemorrhoids without mention of complication 0/78/6754  . FATIGUE 08/07/2008  . HEMORRHOIDS, INTERNAL 05/11/2005  . Hypercalcemia 04/23/2009  . HYPERLIPIDEMIA NEC/NOS 03/01/2007  . HYPERTENSION 10/13/2007  . Impaired fasting glucose 11/14/2008  . INTERSTITIAL CYSTITIS 09/06/2007  . Irritable bowel syndrome 01/06/2009  . LIVER MASS 05/15/2009  . OSTEOPOROSIS 05/09/2009  . PARESTHESIA 09/06/2007  .  POLYCYTHEMIA 04/23/2009  . STENOSIS, SPINAL, UNSPC REGION 03/01/2007  . Thyroid disease    Past Surgical History:  Past Surgical History:  Procedure Laterality Date  . ABDOMINAL HYSTERECTOMY    . BACK SURGERY    . BREAST SURGERY     mastectomy -right  . CERVICAL SPINE SURGERY    . CHOLECYSTECTOMY    . COLONOSCOPY W/ BIOPSIES    . cortisone injection  10/16/12   left knee  . FIXATION KYPHOPLASTY LUMBAR SPINE    . HEMORRHOID BANDING  2015-16  . PARATHYROID EXPLORATION  06/29/2011   Procedure: PARATHYROID EXPLORATION;  Surgeon: Earnstine Regal, MD;  Location: [facility] ORS;  Service: General;  Laterality: Left;  Left Superior Parathyroidectomy  . PARATHYROIDECTOMY  06/29/11   HPI:  83 y.o. female with medical history significant for untreated hypertension, depression, anxiety, and chronic back pain who presents to the emergency department for evaluation of weakness and slurred speech. Complains of right flank pain from recent fall while pulling weeds in the garden. MRA findings include: Chronic microvascular ischemia without acute intracranial. Echo pending.   Assessment / Plan / Recommendation Clinical Impression  Pt appears to be at her cognitive-linguistic baseline, which does include mild-moderate memory impairment. Family at bedside confirms that current status is her baseline, and that she has assistance most days. Pt describes memory compensatory strategies that she uses at home, including alarms on her phone to take medication. No acute SLP needs identified.    SLP Assessment  SLP Recommendation/Assessment: Patient does not need any further Speech Lanaguage Pathology Services SLP Visit Diagnosis: Cognitive communication deficit (R41.841)    Follow Up Recommendations  24 hour supervision/assistance    Frequency and  Duration           SLP Evaluation Cognition  Overall Cognitive Status: History of cognitive impairments - at baseline Orientation Level: Oriented X4       Comprehension   Auditory Comprehension Overall Auditory Comprehension: Appears within functional limits for tasks assessed    Expression Expression Primary Mode of Expression: Verbal Verbal Expression Overall Verbal Expression: Appears within functional limits for tasks assessed   Oral / Motor  Motor Speech Overall Motor Speech: Appears within functional limits for tasks assessed   GO                    Germain Osgood 12/17/2016, 1:20 PM  Germain Osgood, M.A. CCC-SLP [phone]

## 2016-12-18 LAB — HEMOGLOBIN A1C
Hgb A1c MFr Bld: 6.4 % — ABNORMAL HIGH (ref 4.8–5.6)
Mean Plasma Glucose: 137 mg/dL

## 2016-12-21 NOTE — Progress Notes (Signed)
SLP Note (late entry):    12/17/16 1300  SLP G-Codes **NOT FOR INPATIENT CLASS**  Functional Assessment Tool Used skilled clinical judgment  Functional Limitations Memory  Memory Current Status (K8138) CJ  Memory Goal Status (I7195) [place]  Memory Discharge Status (V7471) CJ  SLP Evaluations  $ SLP Speech Visit 1 Procedure  SLP Evaluations  $ SLP EVAL LANGUAGE/SOUND PRODUCTION 1 Procedure   Germain Osgood, M.A. CCC-SLP [phone]

## 2016-12-28 ENCOUNTER — Ambulatory Visit: Payer: Medicare Other | Admitting: Neurology

## 2017-01-07 ENCOUNTER — Emergency Department (HOSPITAL_COMMUNITY): Payer: Medicare Other

## 2017-01-07 ENCOUNTER — Inpatient Hospital Stay (HOSPITAL_COMMUNITY)
Admission: EM | Admit: 2017-01-07 | Discharge: 2017-01-10 | DRG: 065 | Payer: Medicare Other | Attending: Internal Medicine | Admitting: Internal Medicine

## 2017-01-07 ENCOUNTER — Encounter (HOSPITAL_COMMUNITY): Payer: Self-pay

## 2017-01-07 DIAGNOSIS — Z853 Personal history of malignant neoplasm of breast: Secondary | ICD-10-CM

## 2017-01-07 DIAGNOSIS — E785 Hyperlipidemia, unspecified: Secondary | ICD-10-CM | POA: Diagnosis not present

## 2017-01-07 DIAGNOSIS — I472 Ventricular tachycardia: Secondary | ICD-10-CM | POA: Diagnosis present

## 2017-01-07 DIAGNOSIS — Z9011 Acquired absence of right breast and nipple: Secondary | ICD-10-CM

## 2017-01-07 DIAGNOSIS — F329 Major depressive disorder, single episode, unspecified: Secondary | ICD-10-CM

## 2017-01-07 DIAGNOSIS — Z79899 Other long term (current) drug therapy: Secondary | ICD-10-CM

## 2017-01-07 DIAGNOSIS — Z87891 Personal history of nicotine dependence: Secondary | ICD-10-CM

## 2017-01-07 DIAGNOSIS — M4800 Spinal stenosis, site unspecified: Secondary | ICD-10-CM | POA: Diagnosis present

## 2017-01-07 DIAGNOSIS — F419 Anxiety disorder, unspecified: Secondary | ICD-10-CM | POA: Diagnosis present

## 2017-01-07 DIAGNOSIS — D751 Secondary polycythemia: Secondary | ICD-10-CM | POA: Diagnosis present

## 2017-01-07 DIAGNOSIS — I10 Essential (primary) hypertension: Secondary | ICD-10-CM | POA: Diagnosis not present

## 2017-01-07 DIAGNOSIS — Z888 Allergy status to other drugs, medicaments and biological substances status: Secondary | ICD-10-CM

## 2017-01-07 DIAGNOSIS — R200 Anesthesia of skin: Secondary | ICD-10-CM

## 2017-01-07 DIAGNOSIS — I639 Cerebral infarction, unspecified: Secondary | ICD-10-CM | POA: Diagnosis not present

## 2017-01-07 DIAGNOSIS — R4781 Slurred speech: Secondary | ICD-10-CM | POA: Diagnosis present

## 2017-01-07 DIAGNOSIS — R269 Unspecified abnormalities of gait and mobility: Secondary | ICD-10-CM

## 2017-01-07 DIAGNOSIS — Z7982 Long term (current) use of aspirin: Secondary | ICD-10-CM

## 2017-01-07 DIAGNOSIS — I63332 Cerebral infarction due to thrombosis of left posterior cerebral artery: Secondary | ICD-10-CM

## 2017-01-07 DIAGNOSIS — Z91013 Allergy to seafood: Secondary | ICD-10-CM

## 2017-01-07 DIAGNOSIS — G459 Transient cerebral ischemic attack, unspecified: Secondary | ICD-10-CM

## 2017-01-07 DIAGNOSIS — F32A Depression, unspecified: Secondary | ICD-10-CM

## 2017-01-07 LAB — I-STAT TROPONIN, ED: Troponin i, poc: 0 ng/mL (ref 0.00–0.08)

## 2017-01-07 LAB — COMPREHENSIVE METABOLIC PANEL
ALBUMIN: 4.2 g/dL (ref 3.5–5.0)
ALK PHOS: 63 U/L (ref 38–126)
ALT: 19 U/L (ref 14–54)
AST: 18 U/L (ref 15–41)
Anion gap: 9 (ref 5–15)
BILIRUBIN TOTAL: 0.6 mg/dL (ref 0.3–1.2)
BUN: 16 mg/dL (ref 6–20)
CHLORIDE: 104 mmol/L (ref 101–111)
CO2: 25 mmol/L (ref 22–32)
CREATININE: 0.99 mg/dL (ref 0.44–1.00)
Calcium: 9.2 mg/dL (ref 8.9–10.3)
GFR calc Af Amer: 59 mL/min — ABNORMAL LOW (ref >60)
GFR calc non Af Amer: 51 mL/min — ABNORMAL LOW (ref >60)
GLUCOSE: 112 mg/dL — AB (ref 65–99)
POTASSIUM: 4.1 mmol/L (ref 3.5–5.1)
Sodium: 138 mmol/L (ref 135–145)
Total Protein: 6.6 g/dL (ref 6.5–8.1)

## 2017-01-07 LAB — I-STAT CHEM 8, ED
BUN: 17 mg/dL (ref 6–20)
CALCIUM ION: 1.03 mmol/L — AB (ref 1.15–1.40)
CREATININE: 1 mg/dL (ref 0.44–1.00)
Chloride: 103 mmol/L (ref 101–111)
Glucose, Bld: 112 mg/dL — ABNORMAL HIGH (ref 65–99)
HCT: 44 % (ref 36.0–46.0)
Hemoglobin: 15 g/dL (ref 12.0–15.0)
Potassium: 4 mmol/L (ref 3.5–5.1)
Sodium: 140 mmol/L (ref 135–145)
TCO2: 24 mmol/L (ref 0–100)

## 2017-01-07 LAB — PROTIME-INR
INR: 0.99
Prothrombin Time: 13.1 seconds (ref 11.4–15.2)

## 2017-01-07 LAB — DIFFERENTIAL
Basophils Absolute: 0 10*3/uL (ref 0.0–0.1)
Basophils Relative: 0 %
EOS PCT: 2 %
Eosinophils Absolute: 0.1 10*3/uL (ref 0.0–0.7)
LYMPHS ABS: 3.9 10*3/uL (ref 0.7–4.0)
LYMPHS PCT: 43 %
Monocytes Absolute: 0.6 10*3/uL (ref 0.1–1.0)
Monocytes Relative: 6 %
NEUTROS PCT: 49 %
Neutro Abs: 4.5 10*3/uL (ref 1.7–7.7)

## 2017-01-07 LAB — CBC
HEMATOCRIT: 44.7 % (ref 36.0–46.0)
HEMOGLOBIN: 14.9 g/dL (ref 12.0–15.0)
MCH: 30.2 pg (ref 26.0–34.0)
MCHC: 33.3 g/dL (ref 30.0–36.0)
MCV: 90.5 fL (ref 78.0–100.0)
Platelets: 181 10*3/uL (ref 150–400)
RBC: 4.94 MIL/uL (ref 3.87–5.11)
RDW: 12.6 % (ref 11.5–15.5)
WBC: 9.1 10*3/uL (ref 4.0–10.5)

## 2017-01-07 LAB — ETHANOL: Alcohol, Ethyl (B): <5 mg/dL (ref <5)

## 2017-01-07 LAB — APTT: aPTT: 30 seconds (ref 24–36)

## 2017-01-07 LAB — CBG MONITORING, ED: GLUCOSE-CAPILLARY: 101 mg/dL — AB (ref 65–99)

## 2017-01-07 MED ORDER — ACETAMINOPHEN 325 MG PO TABS
650.0000 mg | ORAL_TABLET | ORAL | Status: DC | PRN
  Administered 2017-01-08: 650 mg via ORAL
  Filled 2017-01-07: qty 2

## 2017-01-07 MED ORDER — FLUTICASONE PROPIONATE 50 MCG/ACT NA SUSP: 2.0000 | Freq: Every day | NASAL | Status: DC | PRN

## 2017-01-07 MED ORDER — ACETAMINOPHEN 650 MG RE SUPP: 650.0000 mg | RECTAL | Status: DC | PRN

## 2017-01-07 MED ORDER — OXYCODONE-ACETAMINOPHEN 10-325 MG PO TABS: 1.0000 | ORAL_TABLET | Freq: Every day | ORAL | Status: DC | PRN

## 2017-01-07 MED ORDER — STROKE: EARLY STAGES OF RECOVERY BOOK
Freq: Once | Status: DC
  Filled 2017-01-07: qty 1

## 2017-01-07 MED ORDER — HYDROXYZINE PAMOATE 25 MG PO CAPS
25.0000 mg | ORAL_CAPSULE | Freq: Every day | ORAL | Status: DC
  Filled 2017-01-07: qty 1

## 2017-01-07 MED ORDER — ASPIRIN 81 MG PO CHEW: 81.0000 mg | CHEWABLE_TABLET | Freq: Every day | ORAL | Status: DC

## 2017-01-07 MED ORDER — DIAZEPAM 5 MG PO TABS
10.0000 mg | ORAL_TABLET | Freq: Every day | ORAL | Status: DC | PRN
Start: 2017-01-07 — End: 2017-01-10

## 2017-01-07 MED ORDER — PRAVASTATIN SODIUM 40 MG PO TABS
40.0000 mg | ORAL_TABLET | Freq: Every day | ORAL | Status: DC
  Administered 2017-01-08 – 2017-01-09 (×2): 40 mg via ORAL
  Filled 2017-01-07 (×2): qty 1

## 2017-01-07 MED ORDER — BUPROPION HCL ER (XL) 150 MG PO TB24
300.0000 mg | ORAL_TABLET | Freq: Every day | ORAL | Status: DC
  Administered 2017-01-08 – 2017-01-10 (×3): 300 mg via ORAL
  Filled 2017-01-07 (×4): qty 2

## 2017-01-07 MED ORDER — CELECOXIB 200 MG PO CAPS: 200.0000 mg | ORAL_CAPSULE | Freq: Every day | ORAL | Status: DC | PRN

## 2017-01-07 MED ORDER — ACETAMINOPHEN 500 MG PO TABS: 1000.0000 mg | ORAL_TABLET | Freq: Four times a day (QID) | ORAL | Status: DC | PRN

## 2017-01-07 MED ORDER — ACETAMINOPHEN 160 MG/5ML PO SOLN
650.0000 mg | ORAL | Status: DC | PRN
Start: 2017-01-07 — End: 2017-01-10

## 2017-01-07 MED ORDER — ENOXAPARIN SODIUM 40 MG/0.4ML ~~LOC~~ SOLN
40.0000 mg | Freq: Every day | SUBCUTANEOUS | Status: DC
  Administered 2017-01-08 – 2017-01-09 (×3): 40 mg via SUBCUTANEOUS
  Filled 2017-01-07 (×4): qty 0.4

## 2017-01-07 MED ORDER — ADULT MULTIVITAMIN W/MINERALS CH: 1.0000 | ORAL_TABLET | Freq: Every day | ORAL | Status: DC | PRN

## 2017-01-07 MED ORDER — CARISOPRODOL 350 MG PO TABS
350.0000 mg | ORAL_TABLET | Freq: Every day | ORAL | Status: DC | PRN
  Administered 2017-01-09: 350 mg via ORAL
  Filled 2017-01-07: qty 1

## 2017-01-07 NOTE — H&P (Signed)
History and Physical    [name] KGU:[id number] DOB: 1929-09-07 DOA: 01/07/2017  PCP: Seward Carol, MD  Patient coming from: Home  I have personally briefly reviewed patient's old medical records in [place]  Chief Complaint: R sided numbness / weakness  HPI: [name] is a 83 y.o. female with medical history significant of TIA on April 26th.  Work up demonstrates CVD with disease including mod-severe stenosis of M2 segment of L MCA.  Patient presents to the ED after acute onset of R sided weakness, numbness, and slurred speech.  LKW 9pm.  Taking ASA daily.  Symptoms are improving although numbness just flared up again prior to my evaluation in room.   ED Course: BP on presentation to ED was 210/90, subsequent 184/85.  TPA not given due to improving deficits.  CT head negative.   Review of Systems: As per HPI otherwise 10 point review of systems negative.   Past Medical History:  Diagnosis Date  . Adenomatous colon polyp 04/2005  . Allergic    Anectine  . ANXIETY 10/13/2007  . BACK PAIN 03/01/2007  . BREAST CANCER, HX OF 12/06/2007  . Cancer [facility])    RIGHT mastectomy with node dissection  . Complication of anesthesia    family aggergy to annectine  . Compression fracture of L4 lumbar vertebra (HCC) 02/2013  . CONSTIPATION 01/06/2009  . DEPRESSION 10/13/2007  . Diverticular disease   . DIVERTICULOSIS, COLON 05/11/2005  . External hemorrhoids without mention of complication 02/17/3150  . FATIGUE 08/07/2008  . HEMORRHOIDS, INTERNAL 05/11/2005  . Hypercalcemia 04/23/2009  . HYPERLIPIDEMIA NEC/NOS 03/01/2007  . HYPERTENSION 10/13/2007  . Impaired fasting glucose 11/14/2008  . INTERSTITIAL CYSTITIS 09/06/2007  . Irritable bowel syndrome 01/06/2009  . LIVER MASS 05/15/2009  . OSTEOPOROSIS 05/09/2009  . PARESTHESIA 09/06/2007  . POLYCYTHEMIA 04/23/2009  . STENOSIS, SPINAL, UNSPC REGION 03/01/2007  . Thyroid disease     Past Surgical History:  Procedure Laterality  Date  . ABDOMINAL HYSTERECTOMY    . BACK SURGERY    . BREAST SURGERY     mastectomy -right  . CERVICAL SPINE SURGERY    . CHOLECYSTECTOMY    . COLONOSCOPY W/ BIOPSIES    . cortisone injection  10/16/12   left knee  . FIXATION KYPHOPLASTY LUMBAR SPINE    . HEMORRHOID BANDING  2015-16  . PARATHYROID EXPLORATION  06/29/2011   Procedure: PARATHYROID EXPLORATION;  Surgeon: Earnstine Regal, MD;  Location: [facility] ORS;  Service: General;  Laterality: Left;  Left Superior Parathyroidectomy  . PARATHYROIDECTOMY  06/29/11     reports that she quit smoking about 39 years ago. Her smoking use included Cigarettes. She has a 42.00 pack-year smoking history. She has never used smokeless tobacco. She reports that she does not drink alcohol or use drugs.  Allergies  Allergen Reactions  . Anectine [Succinylcholine Chloride] Other (See Comments)    Reaction:  Patient states that her body wasn't functioning.  Pt states she was put on life support.   . Dilaudid [Hydromorphone Hcl] Other (See Comments)    Slept for 3 days and 3 nights  . Hydrocodone-Acetaminophen Other (See Comments)     Hallucinations, paranoia, amnesia  . Iodine Hives and Other (See Comments)    , asthma  . Naproxen Sodium Other (See Comments)    REACTION: Asthma  . Oxycodone Hcl Other (See Comments)     Hallucinations, paranoia, amnesia  . Pentosan Polysulfate Sodium Other (See Comments)    REACTION: unspecified per  patient  . Pseudoephedrine Other (See Comments)     Palpitations  . Succinylcholine     REACTION: "Can't wake up".  . Tape Dermatitis    Red inflammed  . Tramadol Hcl Other (See Comments)    Unknown reaction  . Shellfish Allergy     Runny nose, cough, hoarseness    Family History  Problem Relation Age of Onset  . Heart disease Sister   . Hypertension Sister   . Asthma Sister   . Breast cancer Maternal Aunt   . Ovarian cancer Paternal Aunt   . Colon cancer Neg Hx   . Esophageal cancer Neg Hx   . Pancreatic  cancer Neg Hx   . Rectal cancer Neg Hx   . Stomach cancer Neg Hx      Prior to Admission medications   Medication Sig Start Date End Date Taking? Authorizing Provider  acetaminophen (TYLENOL) 500 MG tablet Take 1,000 mg by mouth every 6 (six) hours as needed for mild pain.   Yes [provider]  Alum & Mag Hydroxide-Simeth (GELUSIL PO) Take 1 tablet by mouth daily as needed (acid reflux).   Yes [provider]  Ascorbic Acid (VITAMIN C PO) Take 1 tablet by mouth daily as needed (when not eating enough fruit).   Yes [provider]  aspirin 81 MG chewable tablet Chew 1 tablet (81 mg total) by mouth daily. 12/18/16  Yes Rosita Fire, MD  buPROPion (WELLBUTRIN XL) 300 MG 24 hr tablet Take 300 mg by mouth daily.   Yes [provider]  CALCIUM-MAGNESIUM PO Take 1 tablet by mouth daily.   Yes [provider]  carisoprodol (SOMA) 250 MG tablet Take 1 tablet (250 mg total) by mouth daily as needed (Pt is also on the 350 mg Soma.). For muscle spasms. Patient taking differently: Take 250 mg by mouth daily as needed (muscle spasms).  12/17/14  Yes Hoyt Koch, MD  celecoxib (CELEBREX) 200 MG capsule Take 1 capsule (200 mg total) by mouth daily. Patient taking differently: Take 200 mg by mouth daily as needed (arthritis pain).  10/20/15  Yes Hoyt Koch, MD  Cholecalciferol (VITAMIN D3) 1000 UNITS CAPS Take 1,000 Units by mouth daily.    Yes [provider]  Cyanocobalamin (VITAMIN B-12 PO) Take 1 tablet by mouth daily.   Yes [provider]  diazepam (VALIUM) 10 MG tablet Take 10 mg by mouth daily as needed for anxiety.   Yes [provider]  estradiol (ESTRACE) 0.1 MG/GM vaginal cream Place 1 Applicatorful vaginally daily as needed (for dryness).    Yes [provider]  fluticasone (FLONASE) 50 MCG/ACT nasal spray Place 2 sprays into both nostrils daily. Patient taking differently: Place 2 sprays  into both nostrils daily as needed for allergies.  09/06/14  Yes Hoyt Koch, MD  hydrOXYzine (VISTARIL) 25 MG capsule Take 25 mg by mouth at bedtime.   Yes [provider]  Methenamine-Sodium Salicylate (CYSTEX PO) Take 1 tablet by mouth daily as needed (urinary tract pain).   Yes [provider]  Multiple Vitamin (MULTIVITAMIN WITH MINERALS) TABS tablet Take 1 tablet by mouth daily as needed (when not eating well).   Yes [provider]  oxyCODONE-acetaminophen (PERCOCET) 10-325 MG tablet Take 1 tablet by mouth daily as needed for pain.   Yes [provider]  pravastatin (PRAVACHOL) 40 MG tablet Take 1 tablet (40 mg total) by mouth daily at 6 PM. 12/17/16  Yes Carolin Sicks, [place]  Reesa Chew, MD  VITAMIN E PO Take 1 capsule by mouth daily.   Yes [provider]  Zoledronic Acid (RECLAST IV) Inject into the vein. Yearly injection - last injection March 2018   Yes [provider]    Physical Exam: Vitals:   01/07/17 2215 01/07/17 2245 01/07/17 2300 01/07/17 2315  BP: (!) 126/92 (!) 174/77 (!) 184/85 (!) 180/59  Pulse: 89 84 89 89  Resp: 17 17 19 18   Temp:      TempSrc:      SpO2: 96% 96% 97% 95%  Weight:      Height:        Constitutional: NAD, calm, comfortable Eyes: PERRL, lids and conjunctivae normal ENMT: Mucous membranes are moist. Posterior pharynx clear of any exudate or lesions.Normal dentition.  Neck: normal, supple, no masses, no thyromegaly Respiratory: clear to auscultation bilaterally, no wheezing, no crackles. Normal respiratory effort. No accessory muscle use.  Cardiovascular: Regular rate and rhythm, no murmurs / rubs / gallops. No extremity edema. 2+ pedal pulses. No carotid bruits.  Abdomen: no tenderness, no masses palpated. No hepatosplenomegaly. Bowel sounds positive.  Musculoskeletal: no clubbing / cyanosis. No joint deformity upper and lower extremities. Good ROM, no contractures. Normal muscle tone.  Skin: no  rashes, lesions, ulcers. No induration Neurologic: Sensory slightly reduced on R compared to L. Psychiatric: Normal judgment and insight. Alert and oriented x 3. Normal mood.    Labs on Admission: I have personally reviewed following labs and imaging studies  CBC:  Recent Labs Lab 01/07/17 2143 01/07/17 2152  WBC 9.1  --   NEUTROABS 4.5  --   HGB 14.9 15.0  HCT 44.7 44.0  MCV 90.5  --   PLT 181  --    Basic Metabolic Panel:  Recent Labs Lab 01/07/17 2143 01/07/17 2152  NA 138 140  K 4.1 4.0  CL 104 103  CO2 25  --   GLUCOSE 112* 112*  BUN 16 17  CREATININE 0.99 1.00  CALCIUM 9.2  --    GFR: Estimated Creatinine Clearance: 40.3 mL/min (by C-G formula based on SCr of 1 mg/dL). Liver Function Tests:  Recent Labs Lab 01/07/17 2143  AST 18  ALT 19  ALKPHOS 63  BILITOT 0.6  PROT 6.6  ALBUMIN 4.2   No results for input(s): LIPASE, AMYLASE in the last 168 hours. No results for input(s): AMMONIA in the last 168 hours. Coagulation Profile:  Recent Labs Lab 01/07/17 2143  INR 0.99   Cardiac Enzymes: No results for input(s): CKTOTAL, CKMB, CKMBINDEX, TROPONINI in the last 168 hours. BNP (last 3 results) No results for input(s): PROBNP in the last 8760 hours. HbA1C: No results for input(s): HGBA1C in the last 72 hours. CBG:  Recent Labs Lab 01/07/17 2143  GLUCAP 101*   Lipid Profile: No results for input(s): CHOL, HDL, LDLCALC, TRIG, CHOLHDL, LDLDIRECT in the last 72 hours. Thyroid Function Tests: No results for input(s): TSH, T4TOTAL, FREET4, T3FREE, THYROIDAB in the last 72 hours. Anemia Panel: No results for input(s): VITAMINB12, FOLATE, FERRITIN, TIBC, IRON, RETICCTPCT in the last 72 hours. Urine analysis:    Component Value Date/Time   COLORURINE YELLOW 07/16/2013 1421   APPEARANCEUR CLOUDY (A) 07/16/2013 1421   LABSPEC 1.011 07/16/2013 1421   PHURINE 8.0 07/16/2013 1421   GLUCOSEU NEGATIVE 07/16/2013 1421   GLUCOSEU NEGATIVE 10/14/2006  0836   HGBUR TRACE (A) 07/16/2013 1421   HGBUR trace-lysed 08/07/2008 0922   BILIRUBINUR n 10/02/2013 1123   KETONESUR NEGATIVE  07/16/2013 1421   PROTEINUR n 10/02/2013 1123   PROTEINUR NEGATIVE 07/16/2013 1421   UROBILINOGEN 0.2 10/02/2013 1123   UROBILINOGEN 0.2 07/16/2013 1421   NITRITE n 10/02/2013 1123   NITRITE NEGATIVE 07/16/2013 1421   LEUKOCYTESUR small (1+) 10/02/2013 1123    Radiological Exams on Admission: Ct Head Code Stroke W/o Cm  Result Date: 01/07/2017 CLINICAL DATA:  Code stroke. RIGHT facial and arm numbness. Slurred speech. History of breast cancer, hypertension, hyperlipidemia and polycythemia. EXAM: CT HEAD WITHOUT CONTRAST TECHNIQUE: Contiguous axial images were obtained from the base of the skull through the vertex without intravenous contrast. COMPARISON:  MRI of the head December 16, 2016 FINDINGS: BRAIN: No intraparenchymal hemorrhage, mass effect nor midline shift. The ventricles and sulci are normal for age. Patchy supratentorial white matter hypodensities within normal range for patient's age, though non-specific are most compatible with chronic small vessel ischemic disease. 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 No acute large vascular territory infarcts. No abnormal extra-axial fluid collections. Basal cisterns are patent. VASCULAR: Mild calcific atherosclerosis of the carotid siphons. SKULL: No skull fracture. Osteopenia. No significant scalp soft tissue swelling. SINUSES/ORBITS: The mastoid air-cells and included paranasal sinuses are well-aerated.The included ocular globes and orbital contents are non-suspicious. Status post bilateral ocular lens implants. OTHER: None. ASPECTS [facility] Stroke Program Early CT Score) - Ganglionic level infarction (caudate, lentiform nuclei, internal capsule, insula, M1-M3 cortex): 7 - Supraganglionic infarction (M4-M6 cortex): 3 Total score (0-10 with 10 being normal): 10 IMPRESSION: 1. No acute intracranial process. 2. Stable examination  including moderate chronic small vessel ischemic disease and old LEFT basal ganglia infarct. 3. ASPECTS is 10. Critical Value/emergent results were called by telephone at the time of interpretation on 01/07/2017 at 9:57 pm to Dr. Nicole Kindred, Neurology , who verbally acknowledged these results. Electronically Signed   By: Elon Alas M.D.   On: 01/07/2017 21:58    EKG: Independently reviewed.  Assessment/Plan Principal Problem:   TIA (transient ischemic attack) Active Problems:   HLD (hyperlipidemia)   Essential hypertension    1. TIA - 1. Likely due to the known L MCA M2 segment stenosis 1. Stroke team to see in AM 2. Any chance of stenting this maybe? 2. Carotid dopplers 3. MRI brain 4. Note work up less than 1 month ago including MRA, 2d echo, A1C and Lipid profile 5. ASA 325 for now 2. HLD - 1. Try to resume statin 2. Unable to take lipitor due to GI upset apparently 3. HTN - on no BP meds and allowing permissive HTN in setting of acute TIA / CVA with waxing and waning symptoms.  DVT prophylaxis: Lovenox Code Status: Full Family Communication: Family at bedside Disposition Plan: Home after admit Consults called: Neuro Admission status: Place in obs   [place], [place] [facility] Pager [phone]  If 7AM-7PM, please contact day team taking care of patient [email or web address] Password [email or web address]  01/07/2017, 11:45 PM

## 2017-01-07 NOTE — ED Notes (Signed)
Family member made RN aware of pt starting to have numbness of her right arm again. MD notified

## 2017-01-07 NOTE — Consult Note (Signed)
Admission H&P    Chief Complaint: Acute onset of right-sided weakness and numbness, as well as slurred speech.  HPI: [name] is an 83 y.o. female with a history of hypertension, hyperlipidemia, spinal stenosis and recent workup for possible TIA, presenting with new onset right-sided weakness and numbness with slurred speech. She was last known well at 9:00 PM tonight. She's been taking aspirin daily. CT scan of her head showed no acute intracranial abnormality. Workup in late April 2018 included an MRI and echocardiogram which was unremarkable. MRA was also unremarkable. She had really elevated blood pressure at the time of her admission. BP on presentation ED was 210/90 and subsequent BP was 184/85. NIH stroke score was 3. Deficits markedly improved following arrival in the ED.  LSN: 9:00 PM on 01/07/2017 tPA Given: No: Markedly improving deficits. mRankin:  Past Medical History:  Diagnosis Date  . Adenomatous colon polyp 04/2005  . Allergic    Anectine  . ANXIETY 10/13/2007  . BACK PAIN 03/01/2007  . BREAST CANCER, HX OF 12/06/2007  . Cancer [facility])    RIGHT mastectomy with node dissection  . Complication of anesthesia    family aggergy to annectine  . Compression fracture of L4 lumbar vertebra (HCC) 02/2013  . CONSTIPATION 01/06/2009  . DEPRESSION 10/13/2007  . Diverticular disease   . DIVERTICULOSIS, COLON 05/11/2005  . External hemorrhoids without mention of complication 09/02/7354  . FATIGUE 08/07/2008  . HEMORRHOIDS, INTERNAL 05/11/2005  . Hypercalcemia 04/23/2009  . HYPERLIPIDEMIA NEC/NOS 03/01/2007  . HYPERTENSION 10/13/2007  . Impaired fasting glucose 11/14/2008  . INTERSTITIAL CYSTITIS 09/06/2007  . Irritable bowel syndrome 01/06/2009  . LIVER MASS 05/15/2009  . OSTEOPOROSIS 05/09/2009  . PARESTHESIA 09/06/2007  . POLYCYTHEMIA 04/23/2009  . STENOSIS, SPINAL, UNSPC REGION 03/01/2007  . Thyroid disease     Past Surgical History:  Procedure Laterality Date  . ABDOMINAL  HYSTERECTOMY    . BACK SURGERY    . BREAST SURGERY     mastectomy -right  . CERVICAL SPINE SURGERY    . CHOLECYSTECTOMY    . COLONOSCOPY W/ BIOPSIES    . cortisone injection  10/16/12   left knee  . FIXATION KYPHOPLASTY LUMBAR SPINE    . HEMORRHOID BANDING  2015-16  . PARATHYROID EXPLORATION  06/29/2011   Procedure: PARATHYROID EXPLORATION;  Surgeon: Earnstine Regal, MD;  Location: [facility];  Service: General;  Laterality: Left;  Left Superior Parathyroidectomy  . PARATHYROIDECTOMY  06/29/11    Family History  Problem Relation Age of Onset  . Heart disease Sister   . Hypertension Sister   . Asthma Sister   . Breast cancer Maternal Aunt   . Ovarian cancer Paternal Aunt   . Colon cancer Neg Hx   . Esophageal cancer Neg Hx   . Pancreatic cancer Neg Hx   . Rectal cancer Neg Hx   . Stomach cancer Neg Hx    Social History:  reports that she quit smoking about 39 years ago. Her smoking use included Cigarettes. She has a 42.00 pack-year smoking history. She has never used smokeless tobacco. She reports that she does not drink alcohol or use drugs.  Allergies:  Allergies  Allergen Reactions  . Anectine [Succinylcholine Chloride] Other (See Comments)    Reaction:  Patient states that her body wasn't functioning.  Pt states she was put on life support.   . Dilaudid [Hydromorphone Hcl] Other (See Comments)    Slept for 3 days and 3 nights  . Hydrocodone-Acetaminophen Other (See  Comments)     Hallucinations, paranoia, amnesia  . Iodine Hives and Other (See Comments)    , asthma  . Naproxen Sodium Other (See Comments)    REACTION: Asthma  . Oxycodone Hcl Other (See Comments)     Hallucinations, paranoia, amnesia  . Pentosan Polysulfate Sodium Other (See Comments)    REACTION: unspecified per patient  . Pseudoephedrine Other (See Comments)     Palpitations  . Succinylcholine     REACTION: "Can't wake up".  . Tape Dermatitis    Red inflammed  . Tramadol Hcl Other (See Comments)     Unknown reaction  . Shellfish Allergy     Runny nose, cough, hoarseness    Medications: Preadmission medications were reviewed by me.  ROS: History obtained from the patient  General ROS: negative for - chills, fatigue, fever, night sweats, weight gain or weight loss Psychological ROS: negative for - behavioral disorder, hallucinations, memory difficulties, mood swings or suicidal ideation Ophthalmic ROS: negative for - blurry vision, double vision, eye pain or loss of vision ENT ROS: negative for - epistaxis, nasal discharge, oral lesions, sore throat, tinnitus or vertigo Allergy and Immunology ROS: negative for - hives or itchy/watery eyes Hematological and Lymphatic ROS: negative for - bleeding problems, bruising or swollen lymph nodes Endocrine ROS: negative for - galactorrhea, hair pattern changes, polydipsia/polyuria or temperature intolerance Respiratory ROS: negative for - cough, hemoptysis, shortness of breath or wheezing Cardiovascular ROS: negative for - chest pain, dyspnea on exertion, edema or irregular heartbeat Gastrointestinal ROS: negative for - abdominal pain, diarrhea, hematemesis, nausea/vomiting or stool incontinence Genito-Urinary ROS: negative for - dysuria, hematuria, incontinence or urinary frequency/urgency Musculoskeletal ROS: negative for - joint swelling or muscular weakness Neurological ROS: as noted in HPI Dermatological ROS: negative for rash and skin lesion changes  Physical Examination: Blood pressure (!) 201/71, pulse 88, temperature 97.6 F (36.4 C), temperature source Oral, resp. rate 17, height 5' 3.75" (1.619 m), weight 71.4 kg (157 lb 6.5 oz), SpO2 96 %.  HEENT-  Normocephalic, no lesions, without obvious abnormality.  Normal external eye and conjunctiva.  Normal TM's bilaterally.  Normal auditory canals and external ears. Normal external nose, mucus membranes and septum.  Normal pharynx. Neck supple with no masses, nodes, nodules or  enlargement. Cardiovascular - regular rate and rhythm, S1, S2 normal, no murmur, click, rub or gallop Lungs - chest clear, no wheezing, rales, normal symmetric air entry Abdomen - soft, non-tender; bowel sounds normal; no masses,  no organomegaly Extremities - no joint deformities, effusion, or inflammation and no edema  Neurologic Examination: Mental Status: Alert, oriented 3, thought content appropriate.  Speech slightly slurred without evidence of aphasia. Able to follow commands without difficulty. Cranial Nerves: II-Visual fields were normal. III/IV/VI-Pupils were equal and reacted. Extraocular movements were full and conjugate.    V/VII-mild facial numbness; mild right lower facial weakness.Marland Kitchen VIII-normal. X-mild dysarthria.. XI: trapezius strength/neck flexion strength normal bilaterally XII-midline tongue extension with normal strength. Motor: 5/5 bilaterally with normal tone and bulk Sensory: Slightly reduced perception of tactile sensation over right extremities compared to left extremities. Deep Tendon Reflexes: 1+ and symmetric. Plantars: Mute bilaterally Cerebellar: Normal finger-to-nose testing. Carotid auscultation: Normal  Results for orders placed or performed during the hospital encounter of 01/07/17 (from the past 48 hour(s))  Ethanol     Status: None   Collection Time: 01/07/17  9:43 PM  Result Value Ref Range   Alcohol, Ethyl (B) <5 <5 mg/dL    Comment:  LOWEST DETECTABLE LIMIT FOR SERUM ALCOHOL IS 5 mg/dL FOR MEDICAL PURPOSES ONLY   Protime-INR     Status: None   Collection Time: 01/07/17  9:43 PM  Result Value Ref Range   Prothrombin Time 13.1 11.4 - 15.2 seconds   INR 0.99   APTT     Status: None   Collection Time: 01/07/17  9:43 PM  Result Value Ref Range   aPTT 30 24 - 36 seconds  CBC     Status: None   Collection Time: 01/07/17  9:43 PM  Result Value Ref Range   WBC 9.1 4.0 - 10.5 K/uL   RBC 4.94 3.87 - 5.11 MIL/uL   Hemoglobin 14.9  12.0 - 15.0 g/dL   HCT 44.7 36.0 - 46.0 %   MCV 90.5 78.0 - 100.0 fL   MCH 30.2 26.0 - 34.0 pg   MCHC 33.3 30.0 - 36.0 g/dL   RDW 12.6 11.5 - 15.5 %   Platelets 181 150 - 400 K/uL  Differential     Status: None   Collection Time: 01/07/17  9:43 PM  Result Value Ref Range   Neutrophils Relative % 49 %   Neutro Abs 4.5 1.7 - 7.7 K/uL   Lymphocytes Relative 43 %   Lymphs Abs 3.9 0.7 - 4.0 K/uL   Monocytes Relative 6 %   Monocytes Absolute 0.6 0.1 - 1.0 K/uL   Eosinophils Relative 2 %   Eosinophils Absolute 0.1 0.0 - 0.7 K/uL   Basophils Relative 0 %   Basophils Absolute 0.0 0.0 - 0.1 K/uL  Comprehensive metabolic panel     Status: Abnormal   Collection Time: 01/07/17  9:43 PM  Result Value Ref Range   Sodium 138 135 - 145 mmol/L   Potassium 4.1 3.5 - 5.1 mmol/L   Chloride 104 101 - 111 mmol/L   CO2 25 22 - 32 mmol/L   Glucose, Bld 112 (H) 65 - 99 mg/dL   BUN 16 6 - 20 mg/dL   Creatinine, Ser 0.99 0.44 - 1.00 mg/dL   Calcium 9.2 8.9 - 10.3 mg/dL   Total Protein 6.6 6.5 - 8.1 g/dL   Albumin 4.2 3.5 - 5.0 g/dL   AST 18 15 - 41 U/L   ALT 19 14 - 54 U/L   Alkaline Phosphatase 63 38 - 126 U/L   Total Bilirubin 0.6 0.3 - 1.2 mg/dL   GFR calc non Af Amer 51 (L) >60 mL/min   GFR calc Af Amer 59 (L) >60 mL/min    Comment: (NOTE) The eGFR has been calculated using the CKD EPI equation. This calculation has not been validated in all clinical situations. eGFR's persistently <60 mL/min signify possible Chronic Kidney Disease.    Anion gap 9 5 - 15  CBG monitoring, ED     Status: Abnormal   Collection Time: 01/07/17  9:43 PM  Result Value Ref Range   Glucose-Capillary 101 (H) 65 - 99 mg/dL  I-stat troponin, ED (not at [facility], [facility])     Status: None   Collection Time: 01/07/17  9:50 PM  Result Value Ref Range   Troponin i, poc 0.00 0.00 - 0.08 ng/mL   Comment 3            Comment: Due to the release kinetics of cTnI, a negative result within the first hours of the onset of symptoms  does not rule out myocardial infarction with certainty. If myocardial infarction is still suspected, repeat the test at appropriate intervals.  I-Stat Chem 8, ED  (not at [facility], [facility])     Status: Abnormal   Collection Time: 01/07/17  9:52 PM  Result Value Ref Range   Sodium 140 135 - 145 mmol/L   Potassium 4.0 3.5 - 5.1 mmol/L   Chloride 103 101 - 111 mmol/L   BUN 17 6 - 20 mg/dL   Creatinine, Ser 1.00 0.44 - 1.00 mg/dL   Glucose, Bld 112 (H) 65 - 99 mg/dL   Calcium, Ion 1.03 (L) 1.15 - 1.40 mmol/L   TCO2 24 0 - 100 mmol/L   Hemoglobin 15.0 12.0 - 15.0 g/dL   HCT 44.0 36.0 - 46.0 %   Ct Head Code Stroke W/o Cm  Result Date: 01/07/2017 CLINICAL DATA:  Code stroke. RIGHT facial and arm numbness. Slurred speech. History of breast cancer, hypertension, hyperlipidemia and polycythemia. EXAM: CT HEAD WITHOUT CONTRAST TECHNIQUE: Contiguous axial images were obtained from the base of the skull through the vertex without intravenous contrast. COMPARISON:  MRI of the head December 16, 2016 FINDINGS: BRAIN: No intraparenchymal hemorrhage, mass effect nor midline shift. The ventricles and sulci are normal for age. Patchy supratentorial white matter hypodensities within normal range for patient's age, though non-specific are most compatible with chronic small vessel ischemic disease. 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 No acute large vascular territory infarcts. No abnormal extra-axial fluid collections. Basal cisterns are patent. VASCULAR: Mild calcific atherosclerosis of the carotid siphons. SKULL: No skull fracture. Osteopenia. No significant scalp soft tissue swelling. SINUSES/ORBITS: The mastoid air-cells and included paranasal sinuses are well-aerated.The included ocular globes and orbital contents are non-suspicious. Status post bilateral ocular lens implants. OTHER: None. ASPECTS [facility] Stroke Program Early CT Score) - Ganglionic level infarction (caudate, lentiform nuclei, internal capsule, insula, M1-M3  cortex): 7 - Supraganglionic infarction (M4-M6 cortex): 3 Total score (0-10 with 10 being normal): 10 IMPRESSION: 1. No acute intracranial process. 2. Stable examination including moderate chronic small vessel ischemic disease and old LEFT basal ganglia infarct. 3. ASPECTS is 10. Critical Value/emergent results were called by telephone at the time of interpretation on 01/07/2017 at 9:57 pm to Dr. Nicole Kindred, Neurology , who verbally acknowledged these results. Electronically Signed   By: Elon Alas M.D.   On: 01/07/2017 21:58    Assessment: 83 y.o. female with multiple risk factors for stroke as well as possible recent TIA presenting with probable recurrent left subcortical MCA territory TIA. However, a an acute small vessel infarction cannot be ruled out at this point.  Stroke Risk Factors - hyperlipidemia and hypertension  Plan: 1. HgbA1c, fasting lipid panel 2. MRI of the brain without contrast 3. PT consult, OT consult, Speech consult 4. Carotid dopplers 5. Prophylactic therapy-Antiplatelet med: Aspirin  6. Risk factor modification 7. Telemetry monitoring  C.R. Nicole Kindred, MD [facility] [phone]  01/07/2017, 11:08 PM

## 2017-01-07 NOTE — ED Provider Notes (Signed)
[place] DEPT Provider Note   CSN: [id number] Arrival date & time: 01/07/17  2139   An emergency department physician performed an initial assessment on this suspected stroke patient at 2142.  History   Chief Complaint Chief Complaint  Patient presents with  . Code Stroke    HPI [name] is a 83 y.o. female with h/o poorly controlled HTN presents to ED with sudden onset right sided numbness and slurred speech at 9PM today.  Patient denies concurrent HA, vision changes, nausea, vomiting, CP, palpitations.  She had difficulty walking due to numbness on right side. Denies dysphagia or choking. No recent falls or head trauma.  No h/o atrial fibrillation or anticoagulation. Patient was admitted for weakness and slurred speech on 12/16/16.   HPI  Past Medical History:  Diagnosis Date  . Adenomatous colon polyp 04/2005  . Allergic    Anectine  . ANXIETY 10/13/2007  . BACK PAIN 03/01/2007  . BREAST CANCER, HX OF 12/06/2007  . Cancer [facility])    RIGHT mastectomy with node dissection  . Complication of anesthesia    family aggergy to annectine  . Compression fracture of L4 lumbar vertebra (HCC) 02/2013  . CONSTIPATION 01/06/2009  . DEPRESSION 10/13/2007  . Diverticular disease   . DIVERTICULOSIS, COLON 05/11/2005  . External hemorrhoids without mention of complication 0/86/5784  . FATIGUE 08/07/2008  . HEMORRHOIDS, INTERNAL 05/11/2005  . Hypercalcemia 04/23/2009  . HYPERLIPIDEMIA NEC/NOS 03/01/2007  . HYPERTENSION 10/13/2007  . Impaired fasting glucose 11/14/2008  . INTERSTITIAL CYSTITIS 09/06/2007  . Irritable bowel syndrome 01/06/2009  . LIVER MASS 05/15/2009  . OSTEOPOROSIS 05/09/2009  . PARESTHESIA 09/06/2007  . POLYCYTHEMIA 04/23/2009  . STENOSIS, SPINAL, UNSPC REGION 03/01/2007  . Thyroid disease     Patient Active Problem List   Diagnosis Date Noted  . TIA (transient ischemic attack) 12/16/2016  . Dysarthria 12/16/2016  . Leg weakness 12/16/2016  . EKG abnormalities  12/16/2016  . Chronic pain 12/16/2016  . Elevated blood pressure 12/22/2014  . Internal hemorrhoids with bleeding and prolapse 11/04/2014  . Lactose intolerance 11/04/2014  . Hemorrhoid 09/11/2014  . Rectal bleeding 09/11/2014  . IBS (irritable bowel syndrome) 09/25/2013  . Hyperparathyroidism, primary ([place]) 05/26/2011  . Osteoporosis 05/09/2009  . POLYCYTHEMIA 04/23/2009  . IMPAIRED FASTING GLUCOSE 11/14/2008  . BREAST CANCER, HX OF 12/06/2007  . Anxiety state 10/13/2007  . Essential hypertension 10/13/2007  . INTERSTITIAL CYSTITIS 09/06/2007  . HYPERLIPIDEMIA NEC/NOS 03/01/2007  . Backache 03/01/2007    Past Surgical History:  Procedure Laterality Date  . ABDOMINAL HYSTERECTOMY    . BACK SURGERY    . BREAST SURGERY     mastectomy -right  . CERVICAL SPINE SURGERY    . CHOLECYSTECTOMY    . COLONOSCOPY W/ BIOPSIES    . cortisone injection  10/16/12   left knee  . FIXATION KYPHOPLASTY LUMBAR SPINE    . HEMORRHOID BANDING  2015-16  . PARATHYROID EXPLORATION  06/29/2011   Procedure: PARATHYROID EXPLORATION;  Surgeon: Earnstine Regal, MD;  Location: [facility] ORS;  Service: General;  Laterality: Left;  Left Superior Parathyroidectomy  . PARATHYROIDECTOMY  06/29/11    OB History    No data available       Home Medications    Prior to Admission medications   Medication Sig Start Date End Date Taking? Authorizing Provider  acetaminophen (TYLENOL) 500 MG tablet Take 1,000 mg by mouth every 6 (six) hours as needed for mild pain.   Yes [provider]  Alum &  Mag Hydroxide-Simeth (GELUSIL PO) Take 1 tablet by mouth daily as needed (acid reflux).   Yes [provider]  Ascorbic Acid (VITAMIN C PO) Take 1 tablet by mouth daily as needed (when not eating enough fruit).   Yes [provider]  aspirin 81 MG chewable tablet Chew 1 tablet (81 mg total) by mouth daily. 12/18/16  Yes Rosita Fire, MD  buPROPion (WELLBUTRIN XL) 300 MG 24 hr tablet Take 300 mg by  mouth daily.   Yes [provider]  CALCIUM-MAGNESIUM PO Take 1 tablet by mouth daily.   Yes [provider]  carisoprodol (SOMA) 250 MG tablet Take 1 tablet (250 mg total) by mouth daily as needed (Pt is also on the 350 mg Soma.). For muscle spasms. Patient taking differently: Take 250 mg by mouth daily as needed (muscle spasms).  12/17/14  Yes Hoyt Koch, MD  celecoxib (CELEBREX) 200 MG capsule Take 1 capsule (200 mg total) by mouth daily. Patient taking differently: Take 200 mg by mouth daily as needed (arthritis pain).  10/20/15  Yes Hoyt Koch, MD  Cholecalciferol (VITAMIN D3) 1000 UNITS CAPS Take 1,000 Units by mouth daily.    Yes [provider]  Cyanocobalamin (VITAMIN B-12 PO) Take 1 tablet by mouth daily.   Yes [provider]  diazepam (VALIUM) 10 MG tablet Take 10 mg by mouth daily as needed for anxiety.   Yes [provider]  estradiol (ESTRACE) 0.1 MG/GM vaginal cream Place 1 Applicatorful vaginally daily as needed (for dryness).    Yes [provider]  fluticasone (FLONASE) 50 MCG/ACT nasal spray Place 2 sprays into both nostrils daily. Patient taking differently: Place 2 sprays into both nostrils daily as needed for allergies.  09/06/14  Yes Hoyt Koch, MD  hydrOXYzine (VISTARIL) 25 MG capsule Take 25 mg by mouth at bedtime.   Yes [provider]  Methenamine-Sodium Salicylate (CYSTEX PO) Take 1 tablet by mouth daily as needed (urinary tract pain).   Yes [provider]  Multiple Vitamin (MULTIVITAMIN WITH MINERALS) TABS tablet Take 1 tablet by mouth daily as needed (when not eating well).   Yes [provider]  oxyCODONE-acetaminophen (PERCOCET) 10-325 MG tablet Take 1 tablet by mouth daily as needed for pain.   Yes [provider]  pravastatin (PRAVACHOL) 40 MG tablet Take 1 tablet (40 mg total) by mouth daily at 6 PM. 12/17/16  Yes Rosita Fire, MD    VITAMIN E PO Take 1 capsule by mouth daily.   Yes [provider]  Zoledronic Acid (RECLAST IV) Inject into the vein. Yearly injection - last injection March 2018   Yes [provider]    Family History Family History  Problem Relation Age of Onset  . Heart disease Sister   . Hypertension Sister   . Asthma Sister   . Breast cancer Maternal Aunt   . Ovarian cancer Paternal Aunt   . Colon cancer Neg Hx   . Esophageal cancer Neg Hx   . Pancreatic cancer Neg Hx   . Rectal cancer Neg Hx   . Stomach cancer Neg Hx     Social History Social History  Substance Use Topics  . Smoking status: Former Smoker    Packs/day: 1.00    Years: 42.00    Types: Cigarettes    Quit date: 08/23/1977  . Smokeless tobacco: Never Used  . Alcohol use No     Allergies   Anectine [succinylcholine chloride]; Dilaudid [hydromorphone  hcl]; Hydrocodone-acetaminophen; Iodine; Naproxen sodium; Oxycodone hcl; Pentosan polysulfate sodium; Pseudoephedrine; Succinylcholine; Tape; Tramadol hcl; and Shellfish allergy   Review of Systems Review of Systems  Constitutional: Negative for fatigue and fever.  HENT: Positive for drooling (right sided). Negative for trouble swallowing and voice change.   Eyes: Negative for photophobia and visual disturbance.  Respiratory: Negative for shortness of breath.   Cardiovascular: Negative for chest pain and palpitations.  Gastrointestinal: Negative for abdominal pain, constipation, diarrhea, nausea and vomiting.  Genitourinary: Negative for difficulty urinating.  Musculoskeletal: Positive for gait problem.  Skin: Negative for wound.  Neurological: Positive for facial asymmetry, speech difficulty and numbness. Negative for tremors, syncope, weakness, light-headedness and headaches.     Physical Exam Updated Vital Signs BP (!) 180/59   Pulse 89   Temp 97.6 F (36.4 C) (Oral)   Resp 18   Ht 5' 3.75" (1.619 m)   Wt 157 lb 6.5 oz (71.4 kg)   SpO2 95%    BMI 27.23 kg/m   Physical Exam  Constitutional: She is oriented to person, place, and time. She appears well-developed and well-nourished. She is cooperative. No distress.  HENT:  Head: Normocephalic and atraumatic.  Nose: Nose normal.  Mouth/Throat: Oropharynx is clear and moist. No oropharyngeal exudate.  Eyes: Conjunctivae and EOM are normal. No scleral icterus.  Neck: Normal range of motion. Neck supple. No JVD present.  Cardiovascular: Normal rate, regular rhythm, normal heart sounds and intact distal pulses.   No murmur heard. Pulmonary/Chest: Effort normal and breath sounds normal. She has no wheezes.  Abdominal: Soft. There is no tenderness.  Musculoskeletal: Normal range of motion. She exhibits no deformity.  Lymphadenopathy:    She has no cervical adenopathy.  Neurological: She is alert and oriented to person, place, and time. A sensory deficit is present.  Pt is alert and oriented to self, place and time. +Speech is slurred. Thought process coherent.   Strength 5/5 in upper and lower extremities.   Sensation to light touch intact in upper and lower extremities.  No trunk instability.  No leg drift.   +Shaky right finger to nose test, normal left finger to nose test. CN I not tested CN II full visual fields  CN III, IV, VI PEERL and EOM intact CN V light touch intact in all 3 divisions of trigeminal nerve CN VII facial nerve movements intact, symmetric +Questionable right sided mouth droop CN VIII hearing intact to finger rub CN IX, X no uvula deviation, symmetric soft palate rise CN XI 5/5 SCM and trapezius strength  CN XII Tongue midline with symmetric L/R movement  Skin: Skin is warm and dry. Capillary refill takes less than 2 seconds.  Psychiatric: She has a normal mood and affect. Her behavior is normal. Judgment and thought content normal.  Nursing note and vitals reviewed.    ED Treatments / Results  Labs (all labs ordered are listed, but only abnormal  results are displayed) Labs Reviewed  COMPREHENSIVE METABOLIC PANEL - Abnormal; Notable for the following:       Result Value   Glucose, Bld 112 (*)    GFR calc non Af Amer 51 (*)    GFR calc Af Amer 59 (*)    All other components within normal limits  I-STAT CHEM 8, ED - Abnormal; Notable for the following:    Glucose, Bld 112 (*)    Calcium, Ion 1.03 (*)    All other components within normal limits  CBG MONITORING, ED - Abnormal; Notable  for the following:    Glucose-Capillary 101 (*)    All other components within normal limits  ETHANOL  PROTIME-INR  APTT  CBC  DIFFERENTIAL  RAPID URINE DRUG SCREEN, HOSP PERFORMED  URINALYSIS, ROUTINE W REFLEX MICROSCOPIC  I-STAT TROPOININ, ED    EKG  EKG Interpretation None       Radiology Ct Head Code Stroke W/o Cm  Result Date: 01/07/2017 CLINICAL DATA:  Code stroke. RIGHT facial and arm numbness. Slurred speech. History of breast cancer, hypertension, hyperlipidemia and polycythemia. EXAM: CT HEAD WITHOUT CONTRAST TECHNIQUE: Contiguous axial images were obtained from the base of the skull through the vertex without intravenous contrast. COMPARISON:  MRI of the head December 16, 2016 FINDINGS: BRAIN: No intraparenchymal hemorrhage, mass effect nor midline shift. The ventricles and sulci are normal for age. Patchy supratentorial white matter hypodensities within normal range for patient's age, though non-specific are most compatible with chronic small vessel ischemic disease. 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 No acute large vascular territory infarcts. No abnormal extra-axial fluid collections. Basal cisterns are patent. VASCULAR: Mild calcific atherosclerosis of the carotid siphons. SKULL: No skull fracture. Osteopenia. No significant scalp soft tissue swelling. SINUSES/ORBITS: The mastoid air-cells and included paranasal sinuses are well-aerated.The included ocular globes and orbital contents are non-suspicious. Status post bilateral ocular lens  implants. OTHER: None. ASPECTS [facility] Stroke Program Early CT Score) - Ganglionic level infarction (caudate, lentiform nuclei, internal capsule, insula, M1-M3 cortex): 7 - Supraganglionic infarction (M4-M6 cortex): 3 Total score (0-10 with 10 being normal): 10 IMPRESSION: 1. No acute intracranial process. 2. Stable examination including moderate chronic small vessel ischemic disease and old LEFT basal ganglia infarct. 3. ASPECTS is 10. Critical Value/emergent results were called by telephone at the time of interpretation on 01/07/2017 at 9:57 pm to Dr. Nicole Kindred, Neurology , who verbally acknowledged these results. Electronically Signed   By: Elon Alas M.D.   On: 01/07/2017 21:58    Procedures Procedures (including critical care time)  Medications Ordered in ED Medications - No data to display   Initial Impression / Assessment and Plan / ED Course  I have reviewed the triage vital signs and the nursing notes.  Pertinent labs & imaging results that were available during my care of the patient were reviewed by me and considered in my medical decision making (see chart for details).   83 year old female with history of TIA and poorly controlled hypertension presents to the ED with sudden onset right-sided numbness and slurred speech starting at 9 PM today. Symptoms have started to resolve since arrival to the ED. On exam well-appearing, alert and oriented 3, nontoxic. Neuro exam remarkable for decreased dull/sharp sensation to right fingertips and shaky finger to nose with the right upper extremity and slight slurred speech. ED lab work un remarkable. U/A pending.  EKG non ischemic, no atrial fib. CT scan negative for acute intracranial process but moderate chronic small vessel ischemic disease with OLD left basal ganglia infarct.  Neurology evaluated patient in the ED and is recommending admission for additional workup. I'll request admission.  1134PM: Spoke to hospitalist who will admit for  observation.  Final Clinical Impressions(s) / ED Diagnoses   Final diagnoses:  Slurred speech  Numbness    New Prescriptions New Prescriptions   No medications on file     Arlean Hopping 01/07/17 2334    Kinnie Feil, PA-C 01/07/17 2335    Merrily Pew, MD 01/08/17 2139

## 2017-01-07 NOTE — ED Triage Notes (Signed)
Per EMS: Pt LSN 2100 this evening. Pt complaining of R arm, leg and facial numbness. Pt with slurred speech upon arrival. Pt a/o x 4 upon arrival.

## 2017-01-08 ENCOUNTER — Encounter (HOSPITAL_COMMUNITY): Payer: Medicare Other

## 2017-01-08 ENCOUNTER — Observation Stay (HOSPITAL_COMMUNITY): Payer: Medicare Other

## 2017-01-08 DIAGNOSIS — Z853 Personal history of malignant neoplasm of breast: Secondary | ICD-10-CM | POA: Diagnosis not present

## 2017-01-08 DIAGNOSIS — R4781 Slurred speech: Secondary | ICD-10-CM | POA: Diagnosis present

## 2017-01-08 DIAGNOSIS — I472 Ventricular tachycardia: Secondary | ICD-10-CM | POA: Diagnosis present

## 2017-01-08 DIAGNOSIS — Z9011 Acquired absence of right breast and nipple: Secondary | ICD-10-CM | POA: Diagnosis not present

## 2017-01-08 DIAGNOSIS — I63332 Cerebral infarction due to thrombosis of left posterior cerebral artery: Secondary | ICD-10-CM | POA: Diagnosis not present

## 2017-01-08 DIAGNOSIS — Z823 Family history of stroke: Secondary | ICD-10-CM | POA: Diagnosis not present

## 2017-01-08 DIAGNOSIS — I6322 Cerebral infarction due to unspecified occlusion or stenosis of basilar arteries: Secondary | ICD-10-CM | POA: Diagnosis not present

## 2017-01-08 DIAGNOSIS — I63212 Cerebral infarction due to unspecified occlusion or stenosis of left vertebral arteries: Secondary | ICD-10-CM | POA: Diagnosis not present

## 2017-01-08 DIAGNOSIS — F419 Anxiety disorder, unspecified: Secondary | ICD-10-CM | POA: Diagnosis present

## 2017-01-08 DIAGNOSIS — G459 Transient cerebral ischemic attack, unspecified: Secondary | ICD-10-CM | POA: Diagnosis present

## 2017-01-08 DIAGNOSIS — I69398 Other sequelae of cerebral infarction: Secondary | ICD-10-CM | POA: Diagnosis not present

## 2017-01-08 DIAGNOSIS — G8929 Other chronic pain: Secondary | ICD-10-CM | POA: Diagnosis not present

## 2017-01-08 DIAGNOSIS — D751 Secondary polycythemia: Secondary | ICD-10-CM | POA: Diagnosis present

## 2017-01-08 DIAGNOSIS — G894 Chronic pain syndrome: Secondary | ICD-10-CM | POA: Diagnosis not present

## 2017-01-08 DIAGNOSIS — Z888 Allergy status to other drugs, medicaments and biological substances status: Secondary | ICD-10-CM | POA: Diagnosis not present

## 2017-01-08 DIAGNOSIS — Z87891 Personal history of nicotine dependence: Secondary | ICD-10-CM | POA: Diagnosis not present

## 2017-01-08 DIAGNOSIS — Z8673 Personal history of transient ischemic attack (TIA), and cerebral infarction without residual deficits: Secondary | ICD-10-CM | POA: Diagnosis not present

## 2017-01-08 DIAGNOSIS — K121 Other forms of stomatitis: Secondary | ICD-10-CM | POA: Diagnosis not present

## 2017-01-08 DIAGNOSIS — I639 Cerebral infarction, unspecified: Principal | ICD-10-CM

## 2017-01-08 DIAGNOSIS — Z79899 Other long term (current) drug therapy: Secondary | ICD-10-CM | POA: Diagnosis not present

## 2017-01-08 DIAGNOSIS — Z91041 Radiographic dye allergy status: Secondary | ICD-10-CM | POA: Diagnosis not present

## 2017-01-08 DIAGNOSIS — M810 Age-related osteoporosis without current pathological fracture: Secondary | ICD-10-CM | POA: Diagnosis present

## 2017-01-08 DIAGNOSIS — R42 Dizziness and giddiness: Secondary | ICD-10-CM | POA: Diagnosis not present

## 2017-01-08 DIAGNOSIS — I10 Essential (primary) hypertension: Secondary | ICD-10-CM | POA: Diagnosis present

## 2017-01-08 DIAGNOSIS — R300 Dysuria: Secondary | ICD-10-CM | POA: Diagnosis not present

## 2017-01-08 DIAGNOSIS — R269 Unspecified abnormalities of gait and mobility: Secondary | ICD-10-CM | POA: Diagnosis not present

## 2017-01-08 DIAGNOSIS — I69393 Ataxia following cerebral infarction: Secondary | ICD-10-CM | POA: Diagnosis present

## 2017-01-08 DIAGNOSIS — F329 Major depressive disorder, single episode, unspecified: Secondary | ICD-10-CM | POA: Diagnosis not present

## 2017-01-08 DIAGNOSIS — M25562 Pain in left knee: Secondary | ICD-10-CM | POA: Diagnosis not present

## 2017-01-08 DIAGNOSIS — Z90710 Acquired absence of both cervix and uterus: Secondary | ICD-10-CM | POA: Diagnosis not present

## 2017-01-08 DIAGNOSIS — Z886 Allergy status to analgesic agent status: Secondary | ICD-10-CM | POA: Diagnosis not present

## 2017-01-08 DIAGNOSIS — Z7982 Long term (current) use of aspirin: Secondary | ICD-10-CM | POA: Diagnosis not present

## 2017-01-08 DIAGNOSIS — E785 Hyperlipidemia, unspecified: Secondary | ICD-10-CM | POA: Diagnosis present

## 2017-01-08 DIAGNOSIS — Z885 Allergy status to narcotic agent status: Secondary | ICD-10-CM | POA: Diagnosis not present

## 2017-01-08 DIAGNOSIS — R209 Unspecified disturbances of skin sensation: Secondary | ICD-10-CM | POA: Diagnosis not present

## 2017-01-08 DIAGNOSIS — Z91013 Allergy to seafood: Secondary | ICD-10-CM | POA: Diagnosis not present

## 2017-01-08 DIAGNOSIS — E784 Other hyperlipidemia: Secondary | ICD-10-CM | POA: Diagnosis not present

## 2017-01-08 DIAGNOSIS — I63339 Cerebral infarction due to thrombosis of unspecified posterior cerebral artery: Secondary | ICD-10-CM | POA: Diagnosis not present

## 2017-01-08 DIAGNOSIS — M4800 Spinal stenosis, site unspecified: Secondary | ICD-10-CM | POA: Diagnosis present

## 2017-01-08 LAB — BASIC METABOLIC PANEL
ANION GAP: 10 (ref 5–15)
BUN: 14 mg/dL (ref 6–20)
CHLORIDE: 102 mmol/L (ref 101–111)
CO2: 27 mmol/L (ref 22–32)
Calcium: 8.8 mg/dL — ABNORMAL LOW (ref 8.9–10.3)
Creatinine, Ser: 1.18 mg/dL — ABNORMAL HIGH (ref 0.44–1.00)
GFR calc Af Amer: 48 mL/min — ABNORMAL LOW (ref >60)
GFR, EST NON AFRICAN AMERICAN: 41 mL/min — AB (ref >60)
GLUCOSE: 140 mg/dL — AB (ref 65–99)
POTASSIUM: 3.8 mmol/L (ref 3.5–5.1)
SODIUM: 139 mmol/L (ref 135–145)

## 2017-01-08 LAB — URINALYSIS, ROUTINE W REFLEX MICROSCOPIC
BILIRUBIN URINE: NEGATIVE
Glucose, UA: NEGATIVE mg/dL
HGB URINE DIPSTICK: NEGATIVE
Ketones, ur: NEGATIVE mg/dL
NITRITE: NEGATIVE
PH: 6 (ref 5.0–8.0)
Protein, ur: NEGATIVE mg/dL
SPECIFIC GRAVITY, URINE: 1.009 (ref 1.005–1.030)

## 2017-01-08 LAB — RAPID URINE DRUG SCREEN, HOSP PERFORMED
Amphetamines: NOT DETECTED
Barbiturates: NOT DETECTED
Benzodiazepines: POSITIVE — AB
Cocaine: NOT DETECTED
OPIATES: NOT DETECTED
TETRAHYDROCANNABINOL: NOT DETECTED

## 2017-01-08 LAB — MAGNESIUM: MAGNESIUM: 2.2 mg/dL (ref 1.7–2.4)

## 2017-01-08 MED ORDER — OXYCODONE HCL 5 MG PO TABS: 5.0000 mg | ORAL_TABLET | Freq: Every day | ORAL | Status: DC | PRN

## 2017-01-08 MED ORDER — HYDROXYZINE HCL 25 MG PO TABS
25.0000 mg | ORAL_TABLET | Freq: Every day | ORAL | Status: DC
  Administered 2017-01-08 – 2017-01-09 (×3): 25 mg via ORAL
  Filled 2017-01-08 (×3): qty 1

## 2017-01-08 MED ORDER — ASPIRIN 325 MG PO TABS
325.0000 mg | ORAL_TABLET | Freq: Every day | ORAL | Status: DC
  Administered 2017-01-08: 325 mg via ORAL
  Filled 2017-01-08: qty 1

## 2017-01-08 MED ORDER — ASPIRIN EC 81 MG PO TBEC
81.0000 mg | DELAYED_RELEASE_TABLET | Freq: Every day | ORAL | Status: DC
  Administered 2017-01-09 – 2017-01-10 (×2): 81 mg via ORAL
  Filled 2017-01-08 (×2): qty 1

## 2017-01-08 MED ORDER — OXYCODONE-ACETAMINOPHEN 5-325 MG PO TABS: 1.0000 | ORAL_TABLET | Freq: Every day | ORAL | Status: DC | PRN

## 2017-01-08 MED ORDER — CLOPIDOGREL BISULFATE 75 MG PO TABS
75.0000 mg | ORAL_TABLET | Freq: Every day | ORAL | Status: DC
  Administered 2017-01-08 – 2017-01-10 (×3): 75 mg via ORAL
  Filled 2017-01-08 (×3): qty 1

## 2017-01-08 NOTE — ED Notes (Signed)
Niece, Drema Halon, [phone]

## 2017-01-08 NOTE — Progress Notes (Signed)
Pt sitting up in chair in room, visitors at the bedside. No needs at this time.

## 2017-01-08 NOTE — Evaluation (Addendum)
Physical Therapy Evaluation Patient Details Name: [name] MRN: [id number] DOB: 1930/06/30 Today's Date: 01/08/2017   History of Present Illness  83 y.o. female with medical history significant of TIA on April 26th.  Work up demonstrates CVD with disease including mod-severe stenosis of M2 segment of L MCA.  Patient presents to the ED after acute onset of R sided weakness, numbness, and slurred speech, MRI revealed Acute ischemia within the left thalamus, in close proximity to the internal capsule.  Clinical Impression  Orders received for PT evaluation. Patient demonstrates deficits in functional mobility as indicated below. Will benefit from continued skilled PT to address deficits and maximize function. Will see as indicated and progress as tolerated.  OF NOTE: Patient recently discharged home at modified independent levels with intermittent supervision, patient currently showing deficits in mobility and gait coordination creating increased fall risk for patient. At this time recommending post acute rehabilitation.  Follow Up Recommendations CIR;Supervision/Assistance - 24 hour    Equipment Recommendations  None recommended by PT    Recommendations for Other Services       Precautions / Restrictions Precautions Precautions: Fall Restrictions Weight Bearing Restrictions: No      Mobility  Bed Mobility Overal bed mobility: Needs Assistance Bed Mobility: Supine to Sit     Supine to sit: Min assist     General bed mobility comments: assist to elevate trunk and rotate to EOB  Transfers Overall transfer level: Needs assistance Equipment used: 1 person hand held assist Transfers: Sit to/from Stand Sit to Stand: Min assist         General transfer comment: min assist to power up to standing, min assist for stability in standing  Ambulation/Gait Ambulation/Gait assistance: Mod assist Ambulation Distance (Feet): 30 Feet Assistive device: 1 person hand held  assist Gait Pattern/deviations: Step-to pattern;Trunk flexed;Shuffle;Decreased stride length;Decreased dorsiflexion - right;Decreased dorsiflexion - left Gait velocity: decreased   General Gait Details: patient with noted instability during ambulation, poor ability to control and coordinate RLE during gait. At times stepping on her own foot. Mioderate assist to prevent LOB throughout.  Stairs            Wheelchair Mobility    Modified Rankin (Stroke Patients Only)       Balance Overall balance assessment: Needs assistance   Sitting balance-Leahy Scale: Good     Standing balance support: During functional activity Standing balance-Leahy Scale: Poor Standing balance comment: noted instability in standing requiring min guard to min assist for static activity                             Pertinent Vitals/Pain Pain Assessment: Faces Faces Pain Scale: No hurt    Home Living Family/patient expects to be discharged to:: Private residence Living Arrangements: Alone Available Help at Discharge: Neighbor;Available PRN/intermittently;Family Type of Home: House Home Access: Stairs to enter Entrance Stairs-Rails: Psychiatric nurse of Steps: 3 Home Layout: Two level;Able to live on main level with bedroom/bathroom Home Equipment: Shower seat - built in;Walker - 4 wheels      Prior Function                 Hand Dominance        Extremity/Trunk Assessment   Upper Extremity Assessment Upper Extremity Assessment: Defer to OT evaluation    Lower Extremity Assessment Lower Extremity Assessment: RLE deficits/detail RLE Deficits / Details: strength WFL, poor proprioception and coordination of RLE during activity RLE Sensation:  decreased light touch;decreased proprioception RLE Coordination: decreased fine motor;decreased gross motor       Communication      Cognition Arousal/Alertness: Awake/alert Behavior During Therapy:  Anxious Overall Cognitive Status: History of cognitive impairments - at baseline                                 General Comments: decreased safety awareness and problem solving      General Comments      Exercises     Assessment/Plan    PT Assessment Patient needs continued PT services  PT Problem List Decreased strength;Decreased balance;Decreased knowledge of use of DME;Decreased safety awareness;Decreased activity tolerance;Pain       PT Treatment Interventions DME instruction;Gait training;Stair training;Balance training;Functional mobility training;Therapeutic exercise;Patient/family education;Therapeutic activities    PT Goals (Current goals can be found in the Care Plan section)  Acute Rehab PT Goals Patient Stated Goal: to go home PT Goal Formulation: With patient Time For Goal Achievement: 01/22/17 Potential to Achieve Goals: Good    Frequency Min 3X/week   Barriers to discharge Decreased caregiver support      Co-evaluation               AM-PAC PT "6 Clicks" Daily Activity  Outcome Measure Difficulty turning over in bed (including adjusting bedclothes, sheets and blankets)?: A Little Difficulty moving from lying on back to sitting on the side of the bed? : Total Difficulty sitting down on and standing up from a chair with arms (e.g., wheelchair, bedside commode, etc,.)?: Total Help needed moving to and from a bed to chair (including a wheelchair)?: A Little Help needed walking in hospital room?: A Little Help needed climbing 3-5 steps with a railing? : A Lot 6 Click Score: 13    End of Session Equipment Utilized During Treatment: Gait belt Activity Tolerance: Patient tolerated treatment well Patient left: in chair;with call bell/phone within reach;with chair alarm set Nurse Communication: Mobility status PT Visit Diagnosis: Other abnormalities of gait and mobility (R26.89);History of falling (Z91.81);Pain Pain - Right/Left:  Right Pain - part of body: Leg    Time: 5974-1638 PT Time Calculation (min) (ACUTE ONLY): 19 min   Charges:   PT Evaluation $PT Eval Moderate Complexity: 1 Procedure     PT G Codes:   PT G-Codes **NOT FOR INPATIENT CLASS** Functional Assessment Tool Used: AM-PAC 6 Clicks Basic Mobility Functional Limitation: Mobility: Walking and moving around Mobility: Walking and Moving Around Current Status (G5364): At least 40 percent but less than 60 percent impaired, limited or restricted Mobility: Walking and Moving Around Goal Status [phone]): At least 20 percent but less than 40 percent impaired, limited or restricted    Alben Deeds, PT DPT  [place] 01/08/2017, 9:13 AM

## 2017-01-08 NOTE — Progress Notes (Signed)
Nutrition Brief Note  Patient identified on the Malnutrition Screening Tool (MST) Report Pt reports stable weight since Fall of last year. Weight hx reviewed.  Pt reports that stress can effect her appetite. Plans for CIR.   Wt Readings from Last 15 Encounters:  01/07/17 157 lb 6.5 oz (71.4 kg)  09/16/16 158 lb 8 oz (71.9 kg)  07/27/16 161 lb (73 kg)  05/19/16 158 lb 3.2 oz (71.8 kg)  06/09/15 170 lb (77.1 kg)  05/01/15 172 lb (78 kg)  03/26/15 175 lb 1.9 oz (79.4 kg)  03/25/15 176 lb 14.4 oz (80.2 kg)  01/29/15 172 lb (78 kg)  12/20/14 173 lb 6 oz (78.6 kg)  12/17/14 174 lb 1.9 oz (79 kg)  12/02/14 173 lb (78.5 kg)  11/04/14 173 lb 4 oz (78.6 kg)  09/11/14 175 lb 6 oz (79.5 kg)  09/06/14 171 lb 3.2 oz (77.7 kg)    Body mass index is 27.23 kg/m. Patient meets criteria for overweight based on current BMI.   Current diet order is Heart Healthy, patient is consuming approximately 100% of meals at this time. Labs and medications reviewed.   No nutrition interventions warranted at this time. If nutrition issues arise, please consult RD.   [place], [place], [place] Pager [phone] After Hours Pager

## 2017-01-08 NOTE — Progress Notes (Signed)
[facility] PROGRESS NOTE  [name] CBJ:[id number] DOB: 1930-08-20 DOA: 01/07/2017  PCP: Seward Carol, MD  Brief History/Interval Summary: 83 year old Caucasian female with a past medical history of a recent TIA on April 26. She was hospitalized at that time and underwent extensive workup. She presented to the emergency department with acute onset of right-sided weakness, numbness and slurred speech. Evaluation revealed stroke at this time. She was hospitalized for further management. No TPA was given due to improving deficits.  Reason for Visit: Acute stroke  Consultants: Neurology  Procedures:  Transthoracic echocardiogram April 27 Study Conclusions  - Left ventricle: The cavity size was normal. Wall thickness was   normal. Systolic function was vigorous. The estimated ejection   fraction was in the range of 65% to 70%. Wall motion was normal;   there were no regional wall motion abnormalities. Doppler   parameters are consistent with abnormal left ventricular   relaxation (grade 1 diastolic dysfunction). - Right ventricle: Systolic function was mildly to moderately   reduced.  Antibiotics: None  Subjective/Interval History: Patient continues to have right arm numbness. Also had slurred speech. Seems to be somewhat distracted at times. Her neck. Was at the bedside.  ROS: No nausea or vomiting  Objective:  Vital Signs  Vitals:   01/08/17 0403 01/08/17 0540 01/08/17 0800 01/08/17 1000  BP: (!) 136/59 (!) 151/61 (!) 153/62   Pulse: 90 95 88   Resp: 20 20 18    Temp: 97.9 F (36.6 C) 98.1 F (36.7 C) 98.3 F (36.8 C) 98.5 F (36.9 C)  TempSrc: Oral Oral Oral   SpO2: 95% 95% 100%   Weight:      Height:        Intake/Output Summary (Last 24 hours) at 01/08/17 1351 Last data filed at 01/08/17 1607  Gross per 24 hour  Intake              360 ml  Output                0 ml  Net              360 ml   Filed Weights   01/07/17 2100  Weight:  71.4 kg (157 lb 6.5 oz)    General appearance: alert, cooperative, appears stated age and no distress Resp: clear to auscultation bilaterally Cardio: regular rate and rhythm, S1, S2 normal, no murmur, click, rub or gallop GI: soft, non-tender; bowel sounds normal; no masses,  no organomegaly Extremities: extremities normal, atraumatic, no cyanosis or edema Neurologic: Awake, alert. Cranial nerves II-12 intact. Tongue is midline. No facial asymmetry. Right-sided pronator drift is noted. Finger to nose diminished on right.  Lab Results:  Data Reviewed: I have personally reviewed following labs and imaging studies  CBC:  Recent Labs Lab 01/07/17 2143 01/07/17 2152  WBC 9.1  --   NEUTROABS 4.5  --   HGB 14.9 15.0  HCT 44.7 44.0  MCV 90.5  --   PLT 181  --     Basic Metabolic Panel:  Recent Labs Lab 01/07/17 2143 01/07/17 2152  NA 138 140  K 4.1 4.0  CL 104 103  CO2 25  --   GLUCOSE 112* 112*  BUN 16 17  CREATININE 0.99 1.00  CALCIUM 9.2  --     GFR: Estimated Creatinine Clearance: 40.3 mL/min (by C-G formula based on SCr of 1 mg/dL).  Liver Function Tests:  Recent Labs Lab 01/07/17 2143  AST 18  ALT  19  ALKPHOS 63  BILITOT 0.6  PROT 6.6  ALBUMIN 4.2    Coagulation Profile:  Recent Labs Lab 01/07/17 2143  INR 0.99   CBG:  Recent Labs Lab 01/07/17 2143  GLUCAP 101*    Radiology Studies: Mr Brain Wo Contrast  Result Date: 01/08/2017 CLINICAL DATA:  Transient ischemic attack. Acute onset right-sided weakness and slurred speech EXAM: MRI HEAD WITHOUT CONTRAST TECHNIQUE: Multiplanar, multiecho pulse sequences of the brain and surrounding structures were obtained without intravenous contrast. COMPARISON:  Head CT 01/07/2017 Brain MRI 12/16/2016 FINDINGS: Brain: The midline structures are normal. There is a small focus of diffusion restriction within the lateral left thalamus, measuring 9 x 5 mm. This is in close proximity to the posterior limb of the  left internal capsule. There is no acute hemorrhage. There is beginning confluent hyperintense T2-weighted signal within the periventricular, subcortical and deep white matter, most commonly seen in the setting of chronic microvascular ischemia. Chronic microhemorrhage versus mineralization in the left basal ganglia. Brain volume is normal for age without age-advanced or lobar predominant atrophy. The dura is normal and there is no extra-axial collection. Vascular: Major intracranial arterial and venous sinus flow voids are preserved. Skull and upper cervical spine: The visualized skull base, calvarium, upper cervical spine and extracranial soft tissues are normal. Sinuses/Orbits: No fluid levels or advanced mucosal thickening. No mastoid effusion. Normal orbits. IMPRESSION: 1. Acute ischemia within the left thalamus, in close proximity to the posterior limb of the left internal capsule. This is in keeping with the reported right-sided weakness. 2. No hemorrhage or mass effect. 3. Chronic microvascular ischemia. Electronically Signed   By: Ulyses Jarred M.D.   On: 01/08/2017 06:33   Ct Head Code Stroke W/o Cm  Result Date: 01/07/2017 CLINICAL DATA:  Code stroke. RIGHT facial and arm numbness. Slurred speech. History of breast cancer, hypertension, hyperlipidemia and polycythemia. EXAM: CT HEAD WITHOUT CONTRAST TECHNIQUE: Contiguous axial images were obtained from the base of the skull through the vertex without intravenous contrast. COMPARISON:  MRI of the head December 16, 2016 FINDINGS: BRAIN: No intraparenchymal hemorrhage, mass effect nor midline shift. The ventricles and sulci are normal for age. Patchy supratentorial white matter hypodensities within normal range for patient's age, though non-specific are most compatible with chronic small vessel ischemic disease. 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 No acute large vascular territory infarcts. No abnormal extra-axial fluid collections. Basal cisterns are patent.  VASCULAR: Mild calcific atherosclerosis of the carotid siphons. SKULL: No skull fracture. Osteopenia. No significant scalp soft tissue swelling. SINUSES/ORBITS: The mastoid air-cells and included paranasal sinuses are well-aerated.The included ocular globes and orbital contents are non-suspicious. Status post bilateral ocular lens implants. OTHER: None. ASPECTS [facility] Stroke Program Early CT Score) - Ganglionic level infarction (caudate, lentiform nuclei, internal capsule, insula, M1-M3 cortex): 7 - Supraganglionic infarction (M4-M6 cortex): 3 Total score (0-10 with 10 being normal): 10 IMPRESSION: 1. No acute intracranial process. 2. Stable examination including moderate chronic small vessel ischemic disease and old LEFT basal ganglia infarct. 3. ASPECTS is 10. Critical Value/emergent results were called by telephone at the time of interpretation on 01/07/2017 at 9:57 pm to Dr. Nicole Kindred, Neurology , who verbally acknowledged these results. Electronically Signed   By: Elon Alas M.D.   On: 01/07/2017 21:58     Medications:  Scheduled: .  stroke: mapping our early stages of recovery book   Does not apply Once  . aspirin  325 mg Oral Daily  . buPROPion  300 mg Oral  Daily  . enoxaparin (LOVENOX) injection  40 mg Subcutaneous QHS  . hydrOXYzine  25 mg Oral QHS  . pravastatin  40 mg Oral q1800   Continuous:  GIT:[id number] **OR** acetaminophen (TYLENOL) oral liquid 160 mg/5 mL **OR** acetaminophen, carisoprodol, celecoxib, diazepam, fluticasone, multivitamin with minerals, oxyCODONE-acetaminophen **AND** oxyCODONE  Assessment/Plan:  Principal Problem:   TIA (transient ischemic attack) Active Problems:   HLD (hyperlipidemia)   Essential hypertension   Stroke (cerebrum) (HCC)    Acute stroke Patient recently hospitalized for TIA. He underwent extensive workup at that time including echocardiogram, MRA neck, LDL, which was 109. Pravastatin was added during previous hospitalization. She  is on aspirin. HbA1c 6.4. Discussed with neurology. Recommends dual antiplatelet treatment for 3 months and then aspirin or Plavix. So. Plavix will be initiated. Change aspirin to 81 mg. PT and OT recommends CIR. Rehab M.D. has been consulted.  Hyperlipidemia. Continue pravastatin.  Elevated blood pressure Not on antihypertensives at home. Currently allowing permissive hypertension. Will likely need to be discharged on some blood pressure lowering drugs. During her previous hospitalization she was noted to be hypertensive.   Abnormal UA noted. But no symptoms. No need for any antibiotics at this time.  DVT Prophylaxis: Lovenox    Code Status: Full code  Family Communication: Discussed with the patient and her nephew  Disposition Plan: Management as outlined above.    LOS: 0 days   [facility] Pager [phone] 01/08/2017, 1:51 PM  If 7PM-7AM, please contact night-coverage at [email or web address], password [facility]

## 2017-01-08 NOTE — Progress Notes (Signed)
STROKE TEAM PROGRESS NOTE   HISTORY OF PRESENT ILLNESS (per record) [name] is an 83 y.o. female with a history of hypertension, hyperlipidemia, spinal stenosis and recent workup for possible TIA, presenting with new onset right-sided weakness and numbness with slurred speech. She was last known well at 9:00 PM tonight. She's been taking aspirin daily. CT scan of her head showed no acute intracranial abnormality. Workup in late April 2018 included an MRI and echocardiogram which was unremarkable. MRA was also unremarkable. She had really elevated blood pressure at the time of her admission. BP on presentation ED was 210/90 and subsequent BP was 184/85. NIH stroke score was 3. Deficits markedly improved following arrival in the ED.  LSN: 9:00 PM on 01/07/2017 tPA Given: No: Markedly improving deficits. mRankin:   SUBJECTIVE (INTERVAL HISTORY) No family is at the bedside.  She still feel right arm and face numbness, tightness feeling but on exam sensation equally felt. Her MRI showed left thalamic lacunar infarct. She admitted that she has long term stress with her relationship with her son and husband.    OBJECTIVE Temp:  [97.6 F (36.4 C)-98.1 F (36.7 C)] 98.1 F (36.7 C) (05/19 0540) Pulse Rate:  [84-98] 95 (05/19 0540) Cardiac Rhythm: Normal sinus rhythm (05/19 0540) Resp:  [14-20] 20 (05/19 0540) BP: (126-210)/(59-92) 151/61 (05/19 0540) SpO2:  [95 %-97 %] 95 % (05/19 0540) Weight:  [71.4 kg (157 lb 6.5 oz)] 71.4 kg (157 lb 6.5 oz) (05/18 2100)  CBC:  Recent Labs Lab 01/07/17 2143 01/07/17 2152  WBC 9.1  --   NEUTROABS 4.5  --   HGB 14.9 15.0  HCT 44.7 44.0  MCV 90.5  --   PLT 181  --     Basic Metabolic Panel:  Recent Labs Lab 01/07/17 2143 01/07/17 2152  NA 138 140  K 4.1 4.0  CL 104 103  CO2 25  --   GLUCOSE 112* 112*  BUN 16 17  CREATININE 0.99 1.00  CALCIUM 9.2  --     Lipid Panel:    Component Value Date/Time   CHOL 181 12/17/2016 0459    TRIG 194 (H) 12/17/2016 0459   HDL 33 (L) 12/17/2016 0459   CHOLHDL 5.5 12/17/2016 0459   VLDL 39 12/17/2016 0459   LDLCALC 109 (H) 12/17/2016 0459   HgbA1c:  Lab Results  Component Value Date   HGBA1C 6.4 (H) 12/17/2016   Urine Drug Screen:    Component Value Date/Time   LABOPIA NONE DETECTED 01/08/2017 0015   COCAINSCRNUR NONE DETECTED 01/08/2017 0015   LABBENZ POSITIVE (A) 01/08/2017 0015   AMPHETMU NONE DETECTED 01/08/2017 0015   THCU NONE DETECTED 01/08/2017 0015   LABBARB NONE DETECTED 01/08/2017 0015    Alcohol Level     Component Value Date/Time   ETH <5 01/07/2017 2143    IMAGING I have personally reviewed the radiological images below and agree with the radiology interpretations.  Mr Brain Wo Contrast 01/08/2017 1. Acute ischemia within the left thalamus, in close proximity to the posterior limb of the left internal capsule. This is in keeping with the reported right-sided weakness.  2. No hemorrhage or mass effect.  3. Chronic microvascular ischemia.   Ct Head Code Stroke W/o Cm 01/07/2017 1. No acute intracranial process.  2. Stable examination including moderate chronic small vessel ischemic disease and old LEFT basal ganglia infarct.  3. ASPECTS is 10.   MRA Head and Neck 12/16/2016 1. Chronic microvascular ischemia without acute intracranial abnormality.  2. No emergent large vessel occlusion. Multifocal moderate to severe atherosclerotic stenosis of the left MCA M2 branches, right PCA P2 segment and left PCA P1 and P2 segments. 3. No hemodynamically significant carotid stenosis. 4. Poor visualization of the left vertebral artery origin and mild narrowing of the right V1 segment, but otherwise normal vertebral arteries and their distal branches.  Transthoracic Echocardiogram 12/17/2016 Study Conclusions - Left ventricle: The cavity size was normal. Wall thickness was   normal. Systolic function was vigorous. The estimated ejection   fraction was in  the range of 65% to 70%. Wall motion was normal;   there were no regional wall motion abnormalities. Doppler   parameters are consistent with abnormal left ventricular   relaxation (grade 1 diastolic dysfunction). - Right ventricle: Systolic function was mildly to moderately   reduced.   PHYSICAL EXAM  Temp:  [97.6 F (36.4 C)-98.5 F (36.9 C)] 98.5 F (36.9 C) (05/19 1000) Pulse Rate:  [84-98] 88 (05/19 0800) Resp:  [14-20] 18 (05/19 0800) BP: (126-210)/(59-92) 153/62 (05/19 0800) SpO2:  [95 %-100 %] 100 % (05/19 0800) Weight:  [157 lb 6.5 oz (71.4 kg)] 157 lb 6.5 oz (71.4 kg) (05/18 2100)  General - Well nourished, well developed, in no apparent distress.  Ophthalmologic - Fundi not visualized due to eye movement.  Cardiovascular - Regular rate and rhythm.  Mental Status -  Level of arousal and orientation to time, place, and person were intact. Language including expression, naming, repetition, comprehension was assessed and found intact. Fund of Knowledge was assessed and was intact.  Cranial Nerves II - XII - II - Visual field intact OU. III, IV, VI - Extraocular movements intact. V - Facial sensation intact bilaterally. VII - Facial movement intact bilaterally. VIII - Hearing & vestibular intact bilaterally. X - Palate elevates symmetrically. XI - Chin turning & shoulder shrug intact bilaterally. XII - Tongue protrusion intact.  Motor Strength - The patient's strength was normal in all extremities and pronator drift was absent.  Bulk was normal and fasciculations were absent.   Motor Tone - Muscle tone was assessed at the neck and appendages and was normal.  Reflexes - The patient's reflexes were 1+ in all extremities and she had no pathological reflexes.  Sensory - Light touch, temperature/pinprick were assessed and were symmetrical.    Coordination - The patient had normal movements in the hands with no ataxia or dysmetria.  Tremor was absent.  Gait and  Station - transition from chair to sofa without difficulty.   ASSESSMENT/PLAN [name] is a 83 y.o. female with history of polycythemia, liver mass, hypertension, hyperlipidemia, previous TIA, spinal stenosis, thyroid disease, and breast cancer presenting with new onset right-sided weakness, numbness, and slurred speech. She did not receive IV t-PA due to resolution of deficits.  Stroke: Acute ischemia within the Lt thalamus - likely small vessel disease.  Resultant  Subjective left sided numbness  CT head  - No acute intracranial process. 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   MRI head - Acute ischemia within the Lt thalamus, close to the posterior limb of the left internal capsule.  MRA H&N - 12/16/2016 - moderate to severe stenosis of the left MCA M2 branches, right PCA P2 left PCA P1 and P2.  2D Echo - 12/17/2016 - EF 65-70%. No cardiac source of emboli identified.  LDL - 12/17/2016 - 109  HgbA1c - 12/17/2016 - 6.4  VTE prophylaxis - Lovenox  Diet Heart Room service appropriate? Yes; Fluid  consistency: Thin  aspirin 81 mg daily prior to admission, now on ASA 325mg  daily. Due to intracranial stenosis, will recommend DAPT with ASA and plavix for 3 months and then either ASA or plavix.   Patient counseled to be compliant with her antithrombotic medications  Ongoing aggressive stroke risk factor management  Therapy recommendations:  CIR recommended. Rehabilitation M.D. consult pending.  Disposition: Pending  Hx of TIA  Admitted 12/16/16 - transient slurry - MRI no acute infarct, MRA showed diffuse athero, LDL 109 A1C 6.4, EF 65-70% - put on ASA - plan to follow up with her neurologist Dr. Tomi Likens on 01/12/17  Hypertension  Stable  Permissive hypertension (OK if < 220/120) but gradually normalize in 5-7 days  Long-term BP goal normotensive  Hyperlipidemia  Home meds:  Pravachol 40 mg daily resumed in hospital  LDL 109, goal < 70   Continue statin at  discharge  Other Stroke Risk Factors  Advanced age  The patient quit smoking cigarettes 39 years ago.  Other Active Problems  Stress at home   Hx of breast cancer  Hx of polycythemia - Hb normal now  Hospital day # 0  Neurology will sign off. Please call with questions. Pt will follow up with stroke clinic in 6 weeks. Thanks for the consult.  Rosalin Hawking, MD PhD Stroke Neurology 01/08/2017 2:14 PM   To contact Stroke Continuity provider, please refer to [email or web address] After hours, contact General Neurology

## 2017-01-08 NOTE — Progress Notes (Signed)
Pt c/o increased R sided arm numbness MD made aware. No changes at this point.

## 2017-01-08 NOTE — Evaluation (Signed)
Occupational Therapy Evaluation Patient Details Name: [name] MRN: [id number] DOB: 1930-01-10 Today's Date: 01/08/2017    History of Present Illness 83 y.o. female with medical history significant of TIA on April 26th.  Work up demonstrates CVD with disease including mod-severe stenosis of M2 segment of L MCA.  Patient presents to the ED after acute onset of R sided weakness, numbness, and slurred speech, MRI revealed Acute ischemia within the left thalamus, in close proximity to the internal capsule.   Clinical Impression   PTA Pt independent in ADL (assist with IADL) and independent with mobility. Pt currently min A for ADL and mobility, please see OT problem list below. Pt with decreased safety awareness and problem solving and demonstrating increase risk for Pt. Pt with ataxic movements in RUE during MMT and decreased sensation, numbness in Right side of face remains as well. Pt will benefit from skilled OT in the acute setting to maximize safety and independence in ADL/IADL and will require CIR level therapy to return to PLOF. OF NOTE: Patient recently discharged home at modified independent levels with intermittent supervision, patient currently showing deficits in creating increased fall risk for patient during ADL and functional transfers. At this time recommending post acute rehabilitation.    Follow Up Recommendations  CIR;Supervision/Assistance - 24 hour    Equipment Recommendations  Other (comment) (defer to next venue)    Recommendations for Other Services Rehab consult     Precautions / Restrictions Precautions Precautions: Fall Restrictions Weight Bearing Restrictions: No      Mobility Bed Mobility Overal bed mobility: Needs Assistance Bed Mobility: Supine to Sit     Supine to sit: Min assist     General bed mobility comments: Pt sitting OOB on couch when OT entered the room  Transfers Overall transfer level: Needs assistance Equipment used: 1  person hand held assist Transfers: Sit to/from Stand Sit to Stand: Min assist         General transfer comment: min assist to power up to standing, min assist for stability in standing    Balance Overall balance assessment: Needs assistance   Sitting balance-Leahy Scale: Good     Standing balance support: During functional activity Standing balance-Leahy Scale: Poor Standing balance comment: noted instability in standing requiring min assist for static activity                           ADL either performed or assessed with clinical judgement   ADL Overall ADL's : Needs assistance/impaired Eating/Feeding: Set up;Sitting   Grooming: Minimal assistance;Wash/dry hands;Oral care;Standing;Cueing for safety Grooming Details (indicate cue type and reason): Pt's RLE unstable during sink level grooming, Pt provided with vc for safety. Pt needed assist in opening containers for grooming items Upper Body Bathing: Set up;Sitting   Lower Body Bathing: Minimal assistance;Sitting/lateral leans;Cueing for safety   Upper Body Dressing : Set up;Sitting   Lower Body Dressing: Minimal assistance;Sit to/from stand   Toilet Transfer: Minimal assistance;Cueing for safety;Ambulation;Comfort height toilet;Grab bars Toilet Transfer Details (indicate cue type and reason): Pt confused when we walked into bathroom, "where is the sink?" had to be reminded of task vc for safety so she would sit on the toilet Toileting- Clothing Manipulation and Hygiene: Sit to/from stand;Min guard Toileting - Clothing Manipulation Details (indicate cue type and reason): use of rail for balance Tub/ Shower Transfer: Minimal assistance   Functional mobility during ADLs: Minimal assistance;Cueing for safety General ADL Comments: Pt presenting with  decrease awareness of deficits and safety and impact ADL as listed above, increase risk of fall      Vision Baseline Vision/History: Wears glasses Patient Visual  Report: No change from baseline Vision Assessment?: No apparent visual deficits     Perception     Praxis      Pertinent Vitals/Pain Pain Assessment: 0-10 Pain Score: 2  Faces Pain Scale: No hurt Pain Location: I broke my coccyx, and all this furniture is the worst! Pain Descriptors / Indicators: Discomfort;Sore Pain Intervention(s): Repositioned;Monitored during session     Hand Dominance Right   Extremity/Trunk Assessment Upper Extremity Assessment Upper Extremity Assessment: RUE deficits/detail;Generalized weakness RUE Deficits / Details: ataxic movement during ADL, poor dept perception and proprioception, RUE lags during forward flexion, decreased coordination - gross and fine motor RUE Sensation: decreased light touch;decreased proprioception RUE Coordination: decreased fine motor;decreased gross motor   Lower Extremity Assessment Lower Extremity Assessment: Defer to PT evaluation RLE Deficits / Details: strength WFL, poor proprioception and coordination of RLE during activity RLE Sensation: decreased light touch;decreased proprioception RLE Coordination: decreased fine motor;decreased gross motor   Cervical / Trunk Assessment Cervical / Trunk Assessment: Kyphotic;Lordotic   Communication Communication Communication: No difficulties   Cognition Arousal/Alertness: Awake/alert Behavior During Therapy: Anxious;Restless Overall Cognitive Status: History of cognitive impairments - at baseline Area of Impairment: Safety/judgement;Following commands;Attention;Problem solving                   Current Attention Level: Alternating   Following Commands: Follows one step commands consistently Safety/Judgement: Decreased awareness of safety;Decreased awareness of deficits   Problem Solving: Slow processing;Difficulty sequencing;Requires verbal cues General Comments: decreased safety awareness and problem solving   General Comments  Pt's nephew in room for  session    Exercises     Shoulder Instructions      Home Living Family/patient expects to be discharged to:: Private residence Living Arrangements: Alone Available Help at Discharge: Neighbor;Available PRN/intermittently;Family Type of Home: House Home Access: Stairs to enter [employer] of Steps: 3 Entrance Stairs-Rails: Right;Left Home Layout: Two level;Able to live on main level with bedroom/bathroom Alternate Level Stairs-Number of Steps: neice stays with her 6 hours 4 days a week   Bathroom Shower/Tub: Walk-in shower;Tub/shower unit;Other (comment) (likes to get into whirlpool)   Bathroom Toilet: Standard     Home Equipment: Shower seat - built in;Walker - 4 wheels      Lives With: Alone    Prior Functioning/Environment Level of Independence: Independent;Independent with assistive device(s)        Comments: reports that she drives to therapy; uses rollator, has neice that stays with her and helps with IADLs (Pt really wants to be independent)        OT Problem List: Decreased strength;Decreased activity tolerance;Impaired balance (sitting and/or standing);Decreased coordination;Decreased safety awareness;Impaired sensation;Impaired UE functional use      OT Treatment/Interventions: Self-care/ADL training;DME and/or AE instruction;Therapeutic activities;Cognitive remediation/compensation;Patient/family education;Balance training    OT Goals(Current goals can be found in the care plan section) Acute Rehab OT Goals Patient Stated Goal: to be safe and independent as possible OT Goal Formulation: With patient Time For Goal Achievement: 01/20/17 Potential to Achieve Goals: Good ADL Goals Pt Will Perform Grooming: with modified independence;standing Pt Will Perform Upper Body Bathing: with modified independence;standing Pt Will Perform Lower Body Bathing: with modified independence;sit to/from stand Pt Will Transfer to Toilet: with modified  independence;ambulating Pt Will Perform Toileting - Clothing Manipulation and hygiene: with modified independence;sit to/from stand Additional ADL Goal #  1: Pt will recall 3 ways to conserve energy during ADL with one or less verbal cues  OT Frequency: Min 3X/week   Barriers to D/C:            Co-evaluation              AM-PAC PT "6 Clicks" Daily Activity     Outcome Measure Help from another person eating meals?: A Little Help from another person taking care of personal grooming?: A Little Help from another person toileting, which includes using toliet, bedpan, or urinal?: A Little Help from another person bathing (including washing, rinsing, drying)?: A Little Help from another person to put on and taking off regular upper body clothing?: A Little Help from another person to put on and taking off regular lower body clothing?: A Little 6 Click Score: 18   End of Session Equipment Utilized During Treatment: Gait belt Nurse Communication: Mobility status  Activity Tolerance: Patient tolerated treatment well Patient left: with chair alarm set;in chair;with call bell/phone within reach;with family/visitor present (Pt sitting on couch with alarm underneath her)  OT Visit Diagnosis: Unsteadiness on feet (R26.81);Other symptoms and signs involving the nervous system (R29.898);Ataxia, unspecified (R27.0);Other abnormalities of gait and mobility (R26.89)                Time: 1040-1130 OT Time Calculation (min): 50 min Charges:  OT General Charges $OT Visit: 1 Procedure OT Evaluation $OT Eval Moderate Complexity: 1 Procedure OT Treatments $Self Care/Home Management : 23-37 mins G-Codes:     Hulda Humphrey OTR/L [facility] 01/08/2017, 12:10 PM

## 2017-01-09 DIAGNOSIS — I10 Essential (primary) hypertension: Secondary | ICD-10-CM

## 2017-01-09 DIAGNOSIS — E784 Other hyperlipidemia: Secondary | ICD-10-CM

## 2017-01-09 DIAGNOSIS — I63339 Cerebral infarction due to thrombosis of unspecified posterior cerebral artery: Secondary | ICD-10-CM

## 2017-01-09 LAB — BASIC METABOLIC PANEL WITH GFR
Anion gap: 11 (ref 5–15)
BUN: 15 mg/dL (ref 6–20)
CO2: 27 mmol/L (ref 22–32)
Calcium: 8.9 mg/dL (ref 8.9–10.3)
Chloride: 102 mmol/L (ref 101–111)
Creatinine, Ser: 1.01 mg/dL — ABNORMAL HIGH (ref 0.44–1.00)
GFR calc Af Amer: 58 mL/min — ABNORMAL LOW
GFR calc non Af Amer: 50 mL/min — ABNORMAL LOW
Glucose, Bld: 120 mg/dL — ABNORMAL HIGH (ref 65–99)
Potassium: 3.8 mmol/L (ref 3.5–5.1)
Sodium: 140 mmol/L (ref 135–145)

## 2017-01-09 LAB — CBC
HCT: 44.3 % (ref 36.0–46.0)
Hemoglobin: 14.3 g/dL (ref 12.0–15.0)
MCH: 29.2 pg (ref 26.0–34.0)
MCHC: 32.3 g/dL (ref 30.0–36.0)
MCV: 90.6 fL (ref 78.0–100.0)
Platelets: 174 K/uL (ref 150–400)
RBC: 4.89 MIL/uL (ref 3.87–5.11)
RDW: 12.7 % (ref 11.5–15.5)
WBC: 7.2 K/uL (ref 4.0–10.5)

## 2017-01-09 MED ORDER — SENNA 8.6 MG PO TABS
1.0000 | ORAL_TABLET | Freq: Every day | ORAL | Status: DC
  Administered 2017-01-09 – 2017-01-10 (×2): 8.6 mg via ORAL
  Filled 2017-01-09 (×2): qty 1

## 2017-01-09 MED ORDER — METOPROLOL TARTRATE 12.5 MG HALF TABLET
12.5000 mg | ORAL_TABLET | Freq: Every day | ORAL | Status: DC
  Administered 2017-01-09 – 2017-01-10 (×2): 12.5 mg via ORAL
  Filled 2017-01-09 (×2): qty 1

## 2017-01-09 MED ORDER — FLEET ENEMA 7-19 GM/118ML RE ENEM
1.0000 | ENEMA | Freq: Every day | RECTAL | Status: DC | PRN
  Administered 2017-01-09: 1 via RECTAL
  Filled 2017-01-09: qty 1

## 2017-01-09 MED ORDER — POLYETHYLENE GLYCOL 3350 17 G PO PACK
17.0000 g | PACK | Freq: Every day | ORAL | Status: DC
  Administered 2017-01-09 – 2017-01-10 (×2): 17 g via ORAL
  Filled 2017-01-09 (×2): qty 1

## 2017-01-09 MED ORDER — POTASSIUM CHLORIDE CRYS ER 20 MEQ PO TBCR
40.0000 meq | EXTENDED_RELEASE_TABLET | Freq: Once | ORAL | Status: AC
  Administered 2017-01-09: 40 meq via ORAL
  Filled 2017-01-09: qty 2

## 2017-01-09 NOTE — Progress Notes (Signed)
22 beats vtach MD made aware pt asymptomatic sitting up eating dinner.

## 2017-01-09 NOTE — Consult Note (Addendum)
Physical Medicine and Rehabilitation Consult Reason for Consult: Rehabilitation for left thalamic infarct Referring Phsyician: Dr Marilynn Rail is an 83 y.o. female.   HPI: 83 year old female with history of hypertension, hyperlipidemia, spinal stenosis, onset of right-sided weakness and slurred speech, 01/07/2017. Echocardiogram showed diastolic dysfunction, but otherwise normal, MRA showed moderate to severe stenosis, left MCA and M2 branches. Was started on dual antiplatelet therapy with aspirin and Plavix for 3 months and then either aspirin or Plavix alone thereafter. LDL was 109, started on Lovenox for DVT. The prophylaxis. Patient was independent with ambulation and mobility prior to admission. OT evaluation demonstrated currently min assist for ADL and mobility. Ambulation distance 110 feet, min assist with rolling walker. Patient states that she has regained the sensation in her right hand today. She is very pleased with her rapid progress.  Review of Systems  Constitutional: Negative for chills, fever and weight loss.  HENT: Negative.   Eyes: Negative.   Respiratory: Negative for cough, shortness of breath and wheezing.   Cardiovascular: Negative for chest pain and leg swelling.  Gastrointestinal: Positive for constipation. Negative for abdominal pain, nausea and vomiting.  Genitourinary: Positive for frequency. Negative for dysuria.  Musculoskeletal: Negative for back pain, joint pain, myalgias and neck pain.  Skin: Negative.   Neurological: Positive for sensory change and focal weakness.  Endo/Heme/Allergies: Negative.   Psychiatric/Behavioral: Negative for depression and substance abuse. The patient does not have insomnia.     Past Medical History:  Diagnosis Date  . Adenomatous colon polyp 04/2005  . Allergic    Anectine  . ANXIETY 10/13/2007  . BACK PAIN 03/01/2007  . BREAST CANCER, HX OF 12/06/2007  . Cancer [facility])    RIGHT mastectomy with node dissection   . Complication of anesthesia    family aggergy to annectine  . Compression fracture of L4 lumbar vertebra (HCC) 02/2013  . CONSTIPATION 01/06/2009  . DEPRESSION 10/13/2007  . Diverticular disease   . DIVERTICULOSIS, COLON 05/11/2005  . External hemorrhoids without mention of complication 04/26/91  . FATIGUE 08/07/2008  . HEMORRHOIDS, INTERNAL 05/11/2005  . Hypercalcemia 04/23/2009  . HYPERLIPIDEMIA NEC/NOS 03/01/2007  . HYPERTENSION 10/13/2007  . Impaired fasting glucose 11/14/2008  . INTERSTITIAL CYSTITIS 09/06/2007  . Irritable bowel syndrome 01/06/2009  . LIVER MASS 05/15/2009  . OSTEOPOROSIS 05/09/2009  . PARESTHESIA 09/06/2007  . POLYCYTHEMIA 04/23/2009  . STENOSIS, SPINAL, UNSPC REGION 03/01/2007  . Thyroid disease    Past Surgical History:  Procedure Laterality Date  . ABDOMINAL HYSTERECTOMY    . BACK SURGERY    . BREAST SURGERY     mastectomy -right  . CERVICAL SPINE SURGERY    . CHOLECYSTECTOMY    . COLONOSCOPY W/ BIOPSIES    . cortisone injection  10/16/12   left knee  . FIXATION KYPHOPLASTY LUMBAR SPINE    . HEMORRHOID BANDING  2015-16  . PARATHYROID EXPLORATION  06/29/2011   Procedure: PARATHYROID EXPLORATION;  Surgeon: Earnstine Regal, MD;  Location: [facility];  Service: General;  Laterality: Left;  Left Superior Parathyroidectomy  . PARATHYROIDECTOMY  06/29/11   Family History  Problem Relation Age of Onset  . Heart disease Sister   . Hypertension Sister   . Asthma Sister   . Breast cancer Maternal Aunt   . Ovarian cancer Paternal Aunt   . Colon cancer Neg Hx   . Esophageal cancer Neg Hx   . Pancreatic cancer Neg Hx   . Rectal cancer Neg Hx   . Stomach cancer Neg  Hx    Social History:  reports that she quit smoking about 39 years ago. Her smoking use included Cigarettes. She has a 42.00 pack-year smoking history. She has never used smokeless tobacco. She reports that she does not drink alcohol or use drugs. Allergies:  Allergies  Allergen Reactions  . Anectine  [Succinylcholine Chloride] Other (See Comments)    Reaction:  Patient states that her body wasn't functioning.  Pt states she was put on life support.   . Dilaudid [Hydromorphone Hcl] Other (See Comments)    Slept for 3 days and 3 nights  . Hydrocodone-Acetaminophen Other (See Comments)    Hallucinations, paranoia, amnesia Pt takes Tylenol at home  . Iodine Hives and Other (See Comments)    , asthma  . Naproxen Sodium Other (See Comments)    REACTION: Asthma  . Oxycodone Hcl Other (See Comments)     Hallucinations, paranoia, amnesia Pt takes Percocet at home with no issues  . Pentosan Polysulfate Sodium Other (See Comments)    REACTION: unspecified per patient  . Pseudoephedrine Other (See Comments)     Palpitations  . Succinylcholine     REACTION: "Can't wake up".  . Tape Dermatitis    Red inflammed  . Tramadol Hcl Other (See Comments)    Unknown reaction  . Shellfish Allergy     Runny nose, cough, hoarseness   Medications Prior to Admission  Medication Sig Dispense Refill  . acetaminophen (TYLENOL) 500 MG tablet Take 1,000 mg by mouth every 6 (six) hours as needed for mild pain.    Marland Kitchen Alum & Mag Hydroxide-Simeth (GELUSIL PO) Take 1 tablet by mouth daily as needed (acid reflux).    . Ascorbic Acid (VITAMIN C PO) Take 1 tablet by mouth daily as needed (when not eating enough fruit).    Marland Kitchen aspirin 81 MG chewable tablet Chew 1 tablet (81 mg total) by mouth daily. 30 tablet 0  . buPROPion (WELLBUTRIN XL) 300 MG 24 hr tablet Take 300 mg by mouth daily.    Marland Kitchen CALCIUM-MAGNESIUM PO Take 1 tablet by mouth daily.    . carisoprodol (SOMA) 250 MG tablet Take 1 tablet (250 mg total) by mouth daily as needed (Pt is also on the 350 mg Soma.). For muscle spasms. (Patient taking differently: Take 250 mg by mouth daily as needed (muscle spasms). ) 90 tablet 0  . celecoxib (CELEBREX) 200 MG capsule Take 1 capsule (200 mg total) by mouth daily. (Patient taking differently: Take 200 mg by mouth daily  as needed (arthritis pain). ) 90 capsule 3  . Cholecalciferol (VITAMIN D3) 1000 UNITS CAPS Take 1,000 Units by mouth daily.     . Cyanocobalamin (VITAMIN B-12 PO) Take 1 tablet by mouth daily.    . diazepam (VALIUM) 10 MG tablet Take 10 mg by mouth daily as needed for anxiety.    Marland Kitchen estradiol (ESTRACE) 0.1 MG/GM vaginal cream Place 1 Applicatorful vaginally daily as needed (for dryness).     . fluticasone (FLONASE) 50 MCG/ACT nasal spray Place 2 sprays into both nostrils daily. (Patient taking differently: Place 2 sprays into both nostrils daily as needed for allergies. ) 16 g 2  . hydrOXYzine (VISTARIL) 25 MG capsule Take 25 mg by mouth at bedtime.    . Methenamine-Sodium Salicylate (CYSTEX PO) Take 1 tablet by mouth daily as needed (urinary tract pain).    . Multiple Vitamin (MULTIVITAMIN WITH MINERALS) TABS tablet Take 1 tablet by mouth daily as needed (when not eating well).    Marland Kitchen  oxyCODONE-acetaminophen (PERCOCET) 10-325 MG tablet Take 1 tablet by mouth daily as needed for pain.    . pravastatin (PRAVACHOL) 40 MG tablet Take 1 tablet (40 mg total) by mouth daily at 6 PM. 30 tablet 0  . VITAMIN E PO Take 1 capsule by mouth daily.    . Zoledronic Acid (RECLAST IV) Inject into the vein. Yearly injection - last injection March 2018      Home: Home Living Family/patient expects to be discharged to:: Private residence Living Arrangements: Alone Available Help at Discharge: Neighbor, Available PRN/intermittently, Family Type of Home: House Home Access: Stairs to enter Technical brewer of Steps: 3 Entrance Stairs-Rails: Right, Left Home Layout: Two level, Able to live on main level with bedroom/bathroom Alternate Level Stairs-Number of Steps: neice stays with her 6 hours 4 days a week Bathroom Shower/Tub: Walk-in shower, Tub/shower unit, Other (comment) (likes to get into whirlpool) Biochemist, clinical: Standard Home Equipment: Shower seat - built in, Environmental consultant - 4 wheels  Lives With: Alone   Functional History: Prior Function Comments: reports that she drives to therapy; uses rollator, has neice that stays with her and helps with IADLs (Pt really wants to be independent) Functional Status:  Mobility:     Ambulation/Gait Ambulation Distance (Feet): 110 Feet (110 min assist with RW; and 20 ft with moderate HHA) Gait velocity: decreased General Gait Details: patient continues to demonstrate noted instability during ambulation, some improvements noted in coordination of RLE but remains unsafe and significantly high fall risk. Moderate assist to prevent LOB without device, and min assist with maximal cues for safety and awareness using RW.    ADL: ADL Grooming Details (indicate cue type and reason): Pt's RLE unstable during sink level grooming, Pt provided with vc for safety. Pt needed assist in opening containers for grooming items Toilet Transfer Details (indicate cue type and reason): Pt confused when we walked into bathroom, "where is the sink?" had to be reminded of task vc for safety so she would sit on the toilet Toileting - Clothing Manipulation Details (indicate cue type and reason): use of rail for balance  Cognition: Cognition Overall Cognitive Status: History of cognitive impairments - at baseline Orientation Level: Oriented X4 Cognition Arousal/Alertness: Awake/alert Behavior During Therapy: Anxious, Restless Overall Cognitive Status: History of cognitive impairments - at baseline Area of Impairment: Safety/judgement, Following commands, Attention, Problem solving Current Attention Level: Alternating Following Commands: Follows one step commands consistently Safety/Judgement: Decreased awareness of safety, Decreased awareness of deficits Problem Solving: Slow processing, Difficulty sequencing, Requires verbal cues General Comments: patient continues to  demonstrate deficts in safety awareness  Blood pressure (!) 164/51, pulse 88, temperature 97.9 F (36.6 C),  temperature source Oral, resp. rate 20, height 5' 3.75" (1.619 m), weight 71.4 kg (157 lb 6.5 oz), SpO2 93 %. Physical Exam  General: No acute distress Mood and affect are appropriate Heart: Regular rate and rhythm no rubs murmurs or extra sounds Lungs: Clear to auscultation, breathing unlabored, no rales or wheezes Abdomen: Positive bowel sounds, soft nontender to palpation, nondistended Extremities: No clubbing, cyanosis, or edema Skin: No evidence of breakdown, no evidence of rash Neurologic: Cranial nerves II through XII intact, motor strength is 5/5 in bilateral deltoid, bicep, tricep, grip, hip flexor, knee extensors, ankle dorsiflexor and plantar flexor Sensory exam normal sensation to light touch and proprioception in bilateral upper and lower extremities Reduced sensation right side of face Cerebellar exam mild dysmetria right upper extremity Musculoskeletal: Full range of motion in all 4 extremities. No joint  swelling Results for orders placed or performed during the hospital encounter of 01/07/17 (from the past 24 hour(s))  Basic metabolic panel     Status: Abnormal   Collection Time: 01/08/17  8:31 PM  Result Value Ref Range   Sodium 139 135 - 145 mmol/L   Potassium 3.8 3.5 - 5.1 mmol/L   Chloride 102 101 - 111 mmol/L   CO2 27 22 - 32 mmol/L   Glucose, Bld 140 (H) 65 - 99 mg/dL   BUN 14 6 - 20 mg/dL   Creatinine, Ser 1.18 (H) 0.44 - 1.00 mg/dL   Calcium 8.8 (L) 8.9 - 10.3 mg/dL   GFR calc non Af Amer 41 (L) >60 mL/min   GFR calc Af Amer 48 (L) >60 mL/min   Anion gap 10 5 - 15  Magnesium     Status: None   Collection Time: 01/08/17  8:31 PM  Result Value Ref Range   Magnesium 2.2 1.7 - 2.4 mg/dL  CBC     Status: None   Collection Time: 01/09/17  4:31 AM  Result Value Ref Range   WBC 7.2 4.0 - 10.5 K/uL   RBC 4.89 3.87 - 5.11 MIL/uL   Hemoglobin 14.3 12.0 - 15.0 g/dL   HCT 44.3 36.0 - 46.0 %   MCV 90.6 78.0 - 100.0 fL   MCH 29.2 26.0 - 34.0 pg   MCHC 32.3 30.0 -  36.0 g/dL   RDW 12.7 11.5 - 15.5 %   Platelets 174 150 - 400 K/uL  Basic metabolic panel     Status: Abnormal   Collection Time: 01/09/17  4:31 AM  Result Value Ref Range   Sodium 140 135 - 145 mmol/L   Potassium 3.8 3.5 - 5.1 mmol/L   Chloride 102 101 - 111 mmol/L   CO2 27 22 - 32 mmol/L   Glucose, Bld 120 (H) 65 - 99 mg/dL   BUN 15 6 - 20 mg/dL   Creatinine, Ser 1.01 (H) 0.44 - 1.00 mg/dL   Calcium 8.9 8.9 - 10.3 mg/dL   GFR calc non Af Amer 50 (L) >60 mL/min   GFR calc Af Amer 58 (L) >60 mL/min   Anion gap 11 5 - 15   Mr Brain Wo Contrast  Result Date: 01/08/2017 CLINICAL DATA:  Transient ischemic attack. Acute onset right-sided weakness and slurred speech EXAM: MRI HEAD WITHOUT CONTRAST TECHNIQUE: Multiplanar, multiecho pulse sequences of the brain and surrounding structures were obtained without intravenous contrast. COMPARISON:  Head CT 01/07/2017 Brain MRI 12/16/2016 FINDINGS: Brain: The midline structures are normal. There is a small focus of diffusion restriction within the lateral left thalamus, measuring 9 x 5 mm. This is in close proximity to the posterior limb of the left internal capsule. There is no acute hemorrhage. There is beginning confluent hyperintense T2-weighted signal within the periventricular, subcortical and deep white matter, most commonly seen in the setting of chronic microvascular ischemia. Chronic microhemorrhage versus mineralization in the left basal ganglia. Brain volume is normal for age without age-advanced or lobar predominant atrophy. The dura is normal and there is no extra-axial collection. Vascular: Major intracranial arterial and venous sinus flow voids are preserved. Skull and upper cervical spine: The visualized skull base, calvarium, upper cervical spine and extracranial soft tissues are normal. Sinuses/Orbits: No fluid levels or advanced mucosal thickening. No mastoid effusion. Normal orbits. IMPRESSION: 1. Acute ischemia within the left thalamus, in  close proximity to the posterior limb of the left internal capsule. This is  in keeping with the reported right-sided weakness. 2. No hemorrhage or mass effect. 3. Chronic microvascular ischemia. Electronically Signed   By: Ulyses Jarred M.D.   On: 01/08/2017 06:33   Ct Head Code Stroke W/o Cm  Result Date: 01/07/2017 CLINICAL DATA:  Code stroke. RIGHT facial and arm numbness. Slurred speech. History of breast cancer, hypertension, hyperlipidemia and polycythemia. EXAM: CT HEAD WITHOUT CONTRAST TECHNIQUE: Contiguous axial images were obtained from the base of the skull through the vertex without intravenous contrast. COMPARISON:  MRI of the head December 16, 2016 FINDINGS: BRAIN: No intraparenchymal hemorrhage, mass effect nor midline shift. The ventricles and sulci are normal for age. Patchy supratentorial white matter hypodensities within normal range for patient's age, though non-specific are most compatible with chronic small vessel ischemic disease. 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 No acute large vascular territory infarcts. No abnormal extra-axial fluid collections. Basal cisterns are patent. VASCULAR: Mild calcific atherosclerosis of the carotid siphons. SKULL: No skull fracture. Osteopenia. No significant scalp soft tissue swelling. SINUSES/ORBITS: The mastoid air-cells and included paranasal sinuses are well-aerated.The included ocular globes and orbital contents are non-suspicious. Status post bilateral ocular lens implants. OTHER: None. ASPECTS [facility] Stroke Program Early CT Score) - Ganglionic level infarction (caudate, lentiform nuclei, internal capsule, insula, M1-M3 cortex): 7 - Supraganglionic infarction (M4-M6 cortex): 3 Total score (0-10 with 10 being normal): 10 IMPRESSION: 1. No acute intracranial process. 2. Stable examination including moderate chronic small vessel ischemic disease and old LEFT basal ganglia infarct. 3. ASPECTS is 10. Critical Value/emergent results were called by telephone at  the time of interpretation on 01/07/2017 at 9:57 pm to Dr. Nicole Kindred, Neurology , who verbally acknowledged these results. Electronically Signed   By: Elon Alas M.D.   On: 01/07/2017 21:58    Assessment/Plan: Diagnosis: Left thalamic infarct with gait disorder 1. Does the need for close, 24 hr/day medical supervision in concert with the patient's rehab needs make it unreasonable for this patient to be served in a less intensive setting? Potentially 2. Co-Morbidities requiring supervision/potential complications: Hypertension, hyperlipidemia, spinal stenosis 3. Due to bladder management, bowel management, safety, skin/wound care, disease management, medication administration and pain management, does the patient require 24 hr/day rehab nursing? Yes 4. Does the patient require coordinated care of a physician, rehab nurse, PT (1-2 hrs/day, 5 days/week) and OT (1-2 hrs/day, 5 days/week) to address physical and functional deficits in the context of the above medical diagnosis(es)? Yes Addressing deficits in the following areas: balance, endurance, locomotion, strength, transferring, bowel/bladder control, bathing, dressing, feeding, grooming and toileting 5. Can the patient actively participate in an intensive therapy program of at least 3 hrs of therapy per day at least 5 days per week? Yes 6. The potential for patient to make measurable gains while on inpatient rehab is fair 7. Anticipated functional outcomes upon discharge from inpatients are Mod I PT, Mod I OT, NASLP 8. Estimated rehab length of stay to reach the above functional goals is: 7d 9. Does the patient have adequate social supports to accommodate these discharge functional goals? Yes 10. Anticipated D/C setting: Home 11. Anticipated post D/C treatments: [place] therapy 12. Overall Rehab/Functional Prognosis: excellent  RECOMMENDATIONS: This patient's condition is appropriate for continued rehabilitative care in the following setting:  CIR Patient has agreed to participate in recommended program. Yes Note that insurance prior authorization may be required for reimbursement for recommended care.  Comment: If patient gets back to modified Indep level in 1-2 days, then would recommend home with home health  Charlett Blake 01/09/2017

## 2017-01-09 NOTE — Progress Notes (Signed)
Physical Therapy Treatment Patient Details Name: [name] MRN: [id number] DOB: [birth year] Today's Date: 01/09/2017    History of Present Illness 83 y.o. female with medical history significant of TIA on April 26th.  Work up demonstrates CVD with disease including mod-severe stenosis of M2 segment of L MCA.  Patient presents to the ED after acute onset of R sided weakness, numbness, and slurred speech, MRI revealed Acute ischemia within the left thalamus, in close proximity to the internal capsule.    PT Comments    Patient seen for mobility progression. Patient remains impulsive and significantly unsteady with activity. Patient with poor insight and awareness of deficits at this time. Therapy very concerned as patient resides alone with modest help from aide 4 hours a day. Do not feel patient is safe for return home alone given functional deficits impacting mobility. Continue to recommend CIR   Follow Up Recommendations  CIR;Supervision/Assistance - 24 hour     Equipment Recommendations  None recommended by PT    Recommendations for Other Services Rehab consult     Precautions / Restrictions Precautions Precautions: Fall Restrictions Weight Bearing Restrictions: No    Mobility  Bed Mobility               General bed mobility comments: received furniture walking to the bathroom with bed alarm going off  Transfers Overall transfer level: Needs assistance Equipment used: 1 person hand held assist Transfers: Sit to/from Stand Sit to Stand: Min assist            Ambulation/Gait Ambulation/Gait assistance: Min assist;Mod assist Ambulation Distance (Feet): 110 Feet (110 min assist with RW; and 20 ft with moderate HHA) Assistive device: 1 person hand held assist Gait Pattern/deviations: Step-to pattern;Trunk flexed;Shuffle;Decreased stride length;Decreased dorsiflexion - right;Decreased dorsiflexion - left Gait velocity: decreased   General Gait Details:  patient continues to demonstrate noted instability during ambulation, some improvements noted in coordination of RLE but remains unsafe and significantly high fall risk. Moderate assist to prevent LOB without device, and min assist with maximal cues for safety and awareness using RW.   Stairs            Wheelchair Mobility    Modified Rankin (Stroke Patients Only) Modified Rankin (Stroke Patients Only) Pre-Morbid Rankin Score: Slight disability Modified Rankin: Moderately severe disability     Balance Overall balance assessment: Needs assistance   Sitting balance-Leahy Scale: Good     Standing balance support: During functional activity Standing balance-Leahy Scale: Poor Standing balance comment: c                            Cognition Arousal/Alertness: Awake/alert Behavior During Therapy: Anxious;Restless Overall Cognitive Status: History of cognitive impairments - at baseline Area of Impairment: Safety/judgement;Following commands;Attention;Problem solving                   Current Attention Level: Alternating   Following Commands: Follows one step commands consistently Safety/Judgement: Decreased awareness of safety;Decreased awareness of deficits   Problem Solving: Slow processing;Difficulty sequencing;Requires verbal cues General Comments: patient continues to  demonstrate deficts in safety awareness      Exercises      General Comments        Pertinent Vitals/Pain Pain Assessment: Faces Pain Score: 2  Pain Location: "my bottom hurts" Pain Descriptors / Indicators: Discomfort;Sore Pain Intervention(s): Repositioned    Home Living  Prior Function            PT Goals (current goals can now be found in the care plan section) Acute Rehab PT Goals Patient Stated Goal: to go home PT Goal Formulation: With patient Time For Goal Achievement: 01/22/17 Potential to Achieve Goals: Good Progress towards PT  goals: Progressing toward goals    Frequency    Min 3X/week      PT Plan Current plan remains appropriate    Co-evaluation              AM-PAC PT "6 Clicks" Daily Activity  Outcome Measure  Difficulty turning over in bed (including adjusting bedclothes, sheets and blankets)?: A Little Difficulty moving from lying on back to sitting on the side of the bed? : A Little Difficulty sitting down on and standing up from a chair with arms (e.g., wheelchair, bedside commode, etc,.)?: A Little Help needed moving to and from a bed to chair (including a wheelchair)?: A Little Help needed walking in hospital room?: A Little Help needed climbing 3-5 steps with a railing? : A Lot 6 Click Score: 17    End of Session Equipment Utilized During Treatment: Gait belt Activity Tolerance: Patient tolerated treatment well Patient left: in bed;with call bell/phone within reach;with bed alarm set Nurse Communication: Mobility status PT Visit Diagnosis: Other abnormalities of gait and mobility (R26.89);History of falling (Z91.81);Pain Pain - Right/Left: Right Pain - part of body: Leg     Time: 5093-2671 PT Time Calculation (min) (ACUTE ONLY): 20 min  Charges:  $Gait Training: 8-22 mins                    G Codes:       Alben Deeds, PT DPT  [place] 01/09/2017, 10:42 AM

## 2017-01-09 NOTE — Progress Notes (Signed)
[facility] PROGRESS NOTE  [name] YFV:[id number] DOB: 08-10-1930 DOA: 01/07/2017  PCP: Seward Carol, MD  Brief History/Interval Summary: 83 year old Caucasian female with a past medical history of a recent TIA on April 26. She was hospitalized at that time and underwent extensive workup. She presented to the emergency department with acute onset of right-sided weakness, numbness and slurred speech. Evaluation revealed stroke at this time. She was hospitalized for further management. No TPA was given due to improving deficits.  Reason for Visit: Acute stroke  Consultants: Neurology  Procedures:  Transthoracic echocardiogram April 27 Study Conclusions  - Left ventricle: The cavity size was normal. Wall thickness was   normal. Systolic function was vigorous. The estimated ejection   fraction was in the range of 65% to 70%. Wall motion was normal;   there were no regional wall motion abnormalities. Doppler   parameters are consistent with abnormal left ventricular   relaxation (grade 1 diastolic dysfunction). - Right ventricle: Systolic function was mildly to moderately   reduced.  Antibiotics: None  Subjective/Interval History: Patient states that her right arm feels better. She has feeling in her fingers. More strength in the right arm. Speech has improved.   ROS: Denies chest pain or shortness of breath.  Objective:  Vital Signs  Vitals:   01/08/17 2119 01/09/17 0116 01/09/17 0500 01/09/17 0854  BP: (!) 160/83 (!) 151/73 137/78 (!) 164/51  Pulse: 92 85 81 88  Resp: 18 18 18 20   Temp: 98.6 F (37 C) 98.5 F (36.9 C) 98.7 F (37.1 C) 97.9 F (36.6 C)  TempSrc: Oral Oral Oral Oral  SpO2: 95% 95% 97% 93%  Weight:      Height:        Intake/Output Summary (Last 24 hours) at 01/09/17 1005 Last data filed at 01/09/17 0854  Gross per 24 hour  Intake              240 ml  Output                0 ml  Net              240 ml   Filed Weights   01/07/17 2100  Weight: 71.4 kg (157 lb 6.5 oz)   Telemetry showed one 20 beat episode of nonsustained VT. PVCs noted.  General appearance: Awake and alert. No distress Resp: Clear to auscultation bilaterally Cardio: S1, S2 is normal, regular. No S3, S4. No rubs, murmurs or bruits GI: Abdomen is soft. Nontender, nondistended. Bowel sounds are present. No masses or organomegaly Extremities: No edema Neurologic: Awake and alert. Oriented 3. Tongue is midline. No facial asymmetry. Improved strength in right upper extremity. No pronator drift noted. Improved Finger to nose.  Lab Results:  Data Reviewed: I have personally reviewed following labs and imaging studies  CBC:  Recent Labs Lab 01/07/17 2143 01/07/17 2152 01/09/17 0431  WBC 9.1  --  7.2  NEUTROABS 4.5  --   --   HGB 14.9 15.0 14.3  HCT 44.7 44.0 44.3  MCV 90.5  --  90.6  PLT 181  --  163    Basic Metabolic Panel:  Recent Labs Lab 01/07/17 2143 01/07/17 2152 01/08/17 2031 01/09/17 0431  NA 138 140 139 140  K 4.1 4.0 3.8 3.8  CL 104 103 102 102  CO2 25  --  27 27  GLUCOSE 112* 112* 140* 120*  BUN 16 17 14 15   CREATININE 0.99 1.00 1.18* 1.01*  CALCIUM 9.2  --  8.8* 8.9  MG  --   --  2.2  --     GFR: Estimated Creatinine Clearance: 39.9 mL/min (A) (by C-G formula based on SCr of 1.01 mg/dL (H)).  Liver Function Tests:  Recent Labs Lab 01/07/17 2143  AST 18  ALT 19  ALKPHOS 63  BILITOT 0.6  PROT 6.6  ALBUMIN 4.2    Coagulation Profile:  Recent Labs Lab 01/07/17 2143  INR 0.99   CBG:  Recent Labs Lab 01/07/17 2143  GLUCAP 101*    Radiology Studies: Mr Brain Wo Contrast  Result Date: 01/08/2017 CLINICAL DATA:  Transient ischemic attack. Acute onset right-sided weakness and slurred speech EXAM: MRI HEAD WITHOUT CONTRAST TECHNIQUE: Multiplanar, multiecho pulse sequences of the brain and surrounding structures were obtained without intravenous contrast. COMPARISON:  Head CT 01/07/2017  Brain MRI 12/16/2016 FINDINGS: Brain: The midline structures are normal. There is a small focus of diffusion restriction within the lateral left thalamus, measuring 9 x 5 mm. This is in close proximity to the posterior limb of the left internal capsule. There is no acute hemorrhage. There is beginning confluent hyperintense T2-weighted signal within the periventricular, subcortical and deep white matter, most commonly seen in the setting of chronic microvascular ischemia. Chronic microhemorrhage versus mineralization in the left basal ganglia. Brain volume is normal for age without age-advanced or lobar predominant atrophy. The dura is normal and there is no extra-axial collection. Vascular: Major intracranial arterial and venous sinus flow voids are preserved. Skull and upper cervical spine: The visualized skull base, calvarium, upper cervical spine and extracranial soft tissues are normal. Sinuses/Orbits: No fluid levels or advanced mucosal thickening. No mastoid effusion. Normal orbits. IMPRESSION: 1. Acute ischemia within the left thalamus, in close proximity to the posterior limb of the left internal capsule. This is in keeping with the reported right-sided weakness. 2. No hemorrhage or mass effect. 3. Chronic microvascular ischemia. Electronically Signed   By: Ulyses Jarred M.D.   On: 01/08/2017 06:33   Ct Head Code Stroke W/o Cm  Result Date: 01/07/2017 CLINICAL DATA:  Code stroke. RIGHT facial and arm numbness. Slurred speech. History of breast cancer, hypertension, hyperlipidemia and polycythemia. EXAM: CT HEAD WITHOUT CONTRAST TECHNIQUE: Contiguous axial images were obtained from the base of the skull through the vertex without intravenous contrast. COMPARISON:  MRI of the head December 16, 2016 FINDINGS: BRAIN: No intraparenchymal hemorrhage, mass effect nor midline shift. The ventricles and sulci are normal for age. Patchy supratentorial white matter hypodensities within normal range for patient's age,  though non-specific are most compatible with chronic small vessel ischemic disease. 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 No acute large vascular territory infarcts. No abnormal extra-axial fluid collections. Basal cisterns are patent. VASCULAR: Mild calcific atherosclerosis of the carotid siphons. SKULL: No skull fracture. Osteopenia. No significant scalp soft tissue swelling. SINUSES/ORBITS: The mastoid air-cells and included paranasal sinuses are well-aerated.The included ocular globes and orbital contents are non-suspicious. Status post bilateral ocular lens implants. OTHER: None. ASPECTS [facility] Stroke Program Early CT Score) - Ganglionic level infarction (caudate, lentiform nuclei, internal capsule, insula, M1-M3 cortex): 7 - Supraganglionic infarction (M4-M6 cortex): 3 Total score (0-10 with 10 being normal): 10 IMPRESSION: 1. No acute intracranial process. 2. Stable examination including moderate chronic small vessel ischemic disease and old LEFT basal ganglia infarct. 3. ASPECTS is 10. Critical Value/emergent results were called by telephone at the time of interpretation on 01/07/2017 at 9:57 pm to Dr. Nicole Kindred, Neurology , who verbally acknowledged  these results. Electronically Signed   By: Elon Alas M.D.   On: 01/07/2017 21:58     Medications:  Scheduled: .  stroke: mapping our early stages of recovery book   Does not apply Once  . aspirin EC  81 mg Oral Daily  . buPROPion  300 mg Oral Daily  . clopidogrel  75 mg Oral Daily  . enoxaparin (LOVENOX) injection  40 mg Subcutaneous QHS  . hydrOXYzine  25 mg Oral QHS  . pravastatin  40 mg Oral q1800   Continuous:  VQM:[id number] **OR** acetaminophen (TYLENOL) oral liquid 160 mg/5 mL **OR** acetaminophen, carisoprodol, celecoxib, diazepam, fluticasone, multivitamin with minerals, oxyCODONE-acetaminophen **AND** oxyCODONE  Assessment/Plan:  Principal Problem:   TIA (transient ischemic attack) Active Problems:   HLD (hyperlipidemia)    Essential hypertension   Stroke (cerebrum) (HCC)    Acute stroke Seems to be improving slowly. Recently hospitalized for TIA. She underwent extensive workup at that time including echocardiogram, MR. LDL, which was 109. Pravastatin was added during previous hospitalization. She is on aspirin. HbA1c 6.4. Discussed with neurology. Recommends dual antiplatelet treatment for 3 months and then aspirin or Plavix. PT and OT recommends CIR. Rehab M.D. has been consulted.  NSVT Patient was asymptomatic during this episode. Electrolytes are normal. Keep potassium level to greater than 4. Echocardiogram shows normal systolic function. Patient tells me that she has had palpitations for long duration of time. She tells me that she has been seen by cardiology in the past and has had extensive testing. No such records found in our system. We will recommend initiating low dose beta blocker. Long discussion with patient. She is willing to try it, but not sure if she wants to take it long-term. Benefits explained.  Hyperlipidemia. Continue pravastatin.  Elevated blood pressure Not on antihypertensives at home. Currently allowing permissive hypertension. Beta blocker initiated as discussed above.   Abnormal UA noted. But no symptoms. No need for any antibiotics at this time.  DVT Prophylaxis: Lovenox    Code Status: Full code  Family Communication: Discussed with the patient and her nephew  Disposition Plan: Await CIR consult.    LOS: 1 day   [facility] Pager [phone] 01/09/2017, 10:05 AM  If 7PM-7AM, please contact night-coverage at [email or web address], password [facility]

## 2017-01-10 ENCOUNTER — Inpatient Hospital Stay (HOSPITAL_COMMUNITY)
Admission: RE | Admit: 2017-01-10 | Discharge: 2017-01-18 | DRG: 057 | Disposition: A | Discharge status: Home Health | Payer: Medicare Other | Source: Intra-hospital | Attending: Physical Medicine & Rehabilitation | Admitting: Physical Medicine & Rehabilitation

## 2017-01-10 DIAGNOSIS — Z9011 Acquired absence of right breast and nipple: Secondary | ICD-10-CM | POA: Diagnosis not present

## 2017-01-10 DIAGNOSIS — R209 Unspecified disturbances of skin sensation: Secondary | ICD-10-CM | POA: Diagnosis not present

## 2017-01-10 DIAGNOSIS — R300 Dysuria: Secondary | ICD-10-CM | POA: Diagnosis not present

## 2017-01-10 DIAGNOSIS — R42 Dizziness and giddiness: Secondary | ICD-10-CM | POA: Diagnosis not present

## 2017-01-10 DIAGNOSIS — E785 Hyperlipidemia, unspecified: Secondary | ICD-10-CM | POA: Diagnosis present

## 2017-01-10 DIAGNOSIS — Z87891 Personal history of nicotine dependence: Secondary | ICD-10-CM | POA: Diagnosis not present

## 2017-01-10 DIAGNOSIS — Z885 Allergy status to narcotic agent status: Secondary | ICD-10-CM | POA: Diagnosis not present

## 2017-01-10 DIAGNOSIS — I6322 Cerebral infarction due to unspecified occlusion or stenosis of basilar arteries: Secondary | ICD-10-CM

## 2017-01-10 DIAGNOSIS — I69398 Other sequelae of cerebral infarction: Secondary | ICD-10-CM | POA: Diagnosis not present

## 2017-01-10 DIAGNOSIS — Z90710 Acquired absence of both cervix and uterus: Secondary | ICD-10-CM | POA: Diagnosis not present

## 2017-01-10 DIAGNOSIS — Z886 Allergy status to analgesic agent status: Secondary | ICD-10-CM

## 2017-01-10 DIAGNOSIS — I69393 Ataxia following cerebral infarction: Principal | ICD-10-CM

## 2017-01-10 DIAGNOSIS — I6381 Other cerebral infarction due to occlusion or stenosis of small artery: Secondary | ICD-10-CM | POA: Diagnosis present

## 2017-01-10 DIAGNOSIS — Z8673 Personal history of transient ischemic attack (TIA), and cerebral infarction without residual deficits: Secondary | ICD-10-CM | POA: Diagnosis not present

## 2017-01-10 DIAGNOSIS — Z888 Allergy status to other drugs, medicaments and biological substances status: Secondary | ICD-10-CM

## 2017-01-10 DIAGNOSIS — I10 Essential (primary) hypertension: Secondary | ICD-10-CM | POA: Diagnosis present

## 2017-01-10 DIAGNOSIS — F329 Major depressive disorder, single episode, unspecified: Secondary | ICD-10-CM

## 2017-01-10 DIAGNOSIS — M25562 Pain in left knee: Secondary | ICD-10-CM

## 2017-01-10 DIAGNOSIS — Z853 Personal history of malignant neoplasm of breast: Secondary | ICD-10-CM | POA: Diagnosis not present

## 2017-01-10 DIAGNOSIS — M810 Age-related osteoporosis without current pathological fracture: Secondary | ICD-10-CM | POA: Diagnosis present

## 2017-01-10 DIAGNOSIS — Z79899 Other long term (current) drug therapy: Secondary | ICD-10-CM | POA: Diagnosis not present

## 2017-01-10 DIAGNOSIS — K121 Other forms of stomatitis: Secondary | ICD-10-CM | POA: Diagnosis not present

## 2017-01-10 DIAGNOSIS — Z7982 Long term (current) use of aspirin: Secondary | ICD-10-CM

## 2017-01-10 DIAGNOSIS — F32A Depression, unspecified: Secondary | ICD-10-CM

## 2017-01-10 DIAGNOSIS — Z91013 Allergy to seafood: Secondary | ICD-10-CM

## 2017-01-10 DIAGNOSIS — F419 Anxiety disorder, unspecified: Secondary | ICD-10-CM | POA: Diagnosis present

## 2017-01-10 DIAGNOSIS — Z91041 Radiographic dye allergy status: Secondary | ICD-10-CM

## 2017-01-10 DIAGNOSIS — Z823 Family history of stroke: Secondary | ICD-10-CM | POA: Diagnosis not present

## 2017-01-10 DIAGNOSIS — R269 Unspecified abnormalities of gait and mobility: Secondary | ICD-10-CM

## 2017-01-10 DIAGNOSIS — G894 Chronic pain syndrome: Secondary | ICD-10-CM

## 2017-01-10 DIAGNOSIS — I63212 Cerebral infarction due to unspecified occlusion or stenosis of left vertebral arteries: Secondary | ICD-10-CM | POA: Diagnosis not present

## 2017-01-10 LAB — BASIC METABOLIC PANEL
Anion gap: 9 (ref 5–15)
BUN: 16 mg/dL (ref 6–20)
CHLORIDE: 103 mmol/L (ref 101–111)
CO2: 27 mmol/L (ref 22–32)
Calcium: 9.3 mg/dL (ref 8.9–10.3)
Creatinine, Ser: 0.92 mg/dL (ref 0.44–1.00)
GFR calc Af Amer: >60 mL/min (ref >60)
GFR calc non Af Amer: 56 mL/min — ABNORMAL LOW (ref >60)
GLUCOSE: 127 mg/dL — AB (ref 65–99)
POTASSIUM: 4.4 mmol/L (ref 3.5–5.1)
Sodium: 139 mmol/L (ref 135–145)

## 2017-01-10 LAB — CBC
HEMATOCRIT: 45.9 % (ref 36.0–46.0)
HEMOGLOBIN: 14.5 g/dL (ref 12.0–15.0)
MCH: 28.9 pg (ref 26.0–34.0)
MCHC: 31.6 g/dL (ref 30.0–36.0)
MCV: 91.4 fL (ref 78.0–100.0)
Platelets: 176 10*3/uL (ref 150–400)
RBC: 5.02 MIL/uL (ref 3.87–5.11)
RDW: 12.8 % (ref 11.5–15.5)
WBC: 9 10*3/uL (ref 4.0–10.5)

## 2017-01-10 MED ORDER — ONDANSETRON HCL 4 MG/2ML IJ SOLN: 4.0000 mg | Freq: Four times a day (QID) | INTRAMUSCULAR | Status: DC | PRN

## 2017-01-10 MED ORDER — DIAZEPAM 5 MG PO TABS: 10.0000 mg | ORAL_TABLET | Freq: Every day | ORAL | Status: DC | PRN

## 2017-01-10 MED ORDER — OXYCODONE-ACETAMINOPHEN 5-325 MG PO TABS
1.0000 | ORAL_TABLET | Freq: Every day | ORAL | Status: DC | PRN
  Administered 2017-01-14: 1 via ORAL
  Filled 2017-01-10: qty 1

## 2017-01-10 MED ORDER — POLYETHYLENE GLYCOL 3350 17 G PO PACK
17.0000 g | PACK | Freq: Every day | ORAL | Status: DC
  Administered 2017-01-11 – 2017-01-14 (×2): 17 g via ORAL
  Filled 2017-01-10 (×5): qty 1

## 2017-01-10 MED ORDER — FLUTICASONE PROPIONATE 50 MCG/ACT NA SUSP
2.0000 | Freq: Every day | NASAL | Status: DC | PRN
  Administered 2017-01-16: 2 via NASAL
  Filled 2017-01-10: qty 16

## 2017-01-10 MED ORDER — ACETAMINOPHEN 325 MG PO TABS
650.0000 mg | ORAL_TABLET | ORAL | Status: DC | PRN
  Administered 2017-01-10 – 2017-01-17 (×10): 650 mg via ORAL
  Filled 2017-01-10 (×10): qty 2

## 2017-01-10 MED ORDER — CARISOPRODOL 350 MG PO TABS
350.0000 mg | ORAL_TABLET | Freq: Every day | ORAL | Status: DC | PRN
Start: 2017-01-10 — End: 2017-01-18
  Administered 2017-01-17: 350 mg via ORAL
  Filled 2017-01-10 (×2): qty 1

## 2017-01-10 MED ORDER — ENOXAPARIN SODIUM 40 MG/0.4ML ~~LOC~~ SOLN
40.0000 mg | Freq: Every day | SUBCUTANEOUS | Status: DC
  Administered 2017-01-10 – 2017-01-17 (×8): 40 mg via SUBCUTANEOUS
  Filled 2017-01-10 (×8): qty 0.4

## 2017-01-10 MED ORDER — ADULT MULTIVITAMIN W/MINERALS CH: 1.0000 | ORAL_TABLET | Freq: Every day | ORAL | Status: DC | PRN

## 2017-01-10 MED ORDER — METOPROLOL TARTRATE 12.5 MG HALF TABLET
12.5000 mg | ORAL_TABLET | Freq: Two times a day (BID) | ORAL | Status: DC
  Administered 2017-01-11 – 2017-01-17 (×14): 12.5 mg via ORAL
  Filled 2017-01-10 (×15): qty 1

## 2017-01-10 MED ORDER — ASPIRIN EC 81 MG PO TBEC
81.0000 mg | DELAYED_RELEASE_TABLET | Freq: Every day | ORAL | Status: DC
  Administered 2017-01-11 – 2017-01-18 (×8): 81 mg via ORAL
  Filled 2017-01-10 (×8): qty 1

## 2017-01-10 MED ORDER — OXYCODONE HCL 5 MG PO TABS: 5.0000 mg | ORAL_TABLET | Freq: Every day | ORAL | Status: DC | PRN

## 2017-01-10 MED ORDER — PRAVASTATIN SODIUM 20 MG PO TABS
40.0000 mg | ORAL_TABLET | Freq: Every day | ORAL | Status: DC
  Administered 2017-01-11 – 2017-01-17 (×7): 40 mg via ORAL
  Filled 2017-01-10 (×8): qty 2

## 2017-01-10 MED ORDER — ONDANSETRON HCL 4 MG PO TABS: 4.0000 mg | ORAL_TABLET | Freq: Four times a day (QID) | ORAL | Status: DC | PRN

## 2017-01-10 MED ORDER — ENOXAPARIN SODIUM 40 MG/0.4ML ~~LOC~~ SOLN: 40.0000 mg | SUBCUTANEOUS | Status: DC

## 2017-01-10 MED ORDER — METOPROLOL TARTRATE 12.5 MG HALF TABLET: 12.5000 mg | ORAL_TABLET | Freq: Two times a day (BID) | ORAL | Status: DC

## 2017-01-10 MED ORDER — HYDROXYZINE HCL 25 MG PO TABS
25.0000 mg | ORAL_TABLET | Freq: Every day | ORAL | Status: DC
  Administered 2017-01-10 – 2017-01-17 (×8): 25 mg via ORAL
  Filled 2017-01-10 (×10): qty 1

## 2017-01-10 MED ORDER — CLOPIDOGREL BISULFATE 75 MG PO TABS
75.0000 mg | ORAL_TABLET | Freq: Every day | ORAL | Status: DC
  Administered 2017-01-11 – 2017-01-18 (×8): 75 mg via ORAL
  Filled 2017-01-10 (×8): qty 1

## 2017-01-10 MED ORDER — ACETAMINOPHEN 650 MG RE SUPP: 650.0000 mg | RECTAL | Status: DC | PRN

## 2017-01-10 MED ORDER — SORBITOL 70 % SOLN: 30.0000 mL | Freq: Every day | Status: DC | PRN

## 2017-01-10 MED ORDER — SENNA 8.6 MG PO TABS
1.0000 | ORAL_TABLET | Freq: Every day | ORAL | Status: DC
  Administered 2017-01-11 – 2017-01-18 (×7): 8.6 mg via ORAL
  Filled 2017-01-10 (×8): qty 1

## 2017-01-10 MED ORDER — ACETAMINOPHEN 160 MG/5ML PO SOLN: 650.0000 mg | ORAL | Status: DC | PRN

## 2017-01-10 MED ORDER — CELECOXIB 200 MG PO CAPS
200.0000 mg | ORAL_CAPSULE | Freq: Every day | ORAL | Status: DC | PRN
  Administered 2017-01-10: 200 mg via ORAL
  Filled 2017-01-10 (×2): qty 1

## 2017-01-10 MED ORDER — FLEET ENEMA 7-19 GM/118ML RE ENEM: 1.0000 | ENEMA | Freq: Every day | RECTAL | Status: DC | PRN

## 2017-01-10 MED ORDER — BUPROPION HCL ER (XL) 300 MG PO TB24
300.0000 mg | ORAL_TABLET | Freq: Every day | ORAL | Status: DC
  Administered 2017-01-11 – 2017-01-18 (×8): 300 mg via ORAL
  Filled 2017-01-10 (×8): qty 1

## 2017-01-10 NOTE — H&P (Signed)
Physical Medicine and Rehabilitation Admission H&P    Chief Complaint  Patient presents with  . Code Stroke  : HPI: 83 year old right handed female with history of polycythemia, right breast cancer with mastectomy, hypertension, hyperlipidemia, spinal stenosis, TIA 12/16/2016 maintained on aspirin. Per chart review patient lives alone independently with assistive device prior to admission. She does have a niece that stays with her at times to assist with ADLs. 2 level home bath and bedroom on main level III steps to entry. Acute onset of right-sided weakness and slurred speech and admitted 01/07/2017. CT reviewed, showing acute left thalamus CVA. Per repot, MRI showed acute ischemia within the left thalamus, in close proximity to the posterior limb of left internal capsule. No hemorrhage or mass effect. Echocardiogram showed diastolic dysfunction, but otherwise normal, MRA showed moderate to severe stenosis, left MCA and M2 branches. Patient did not receive TPA. Was started on dual antiplatelet therapy with aspirin and Plavix for 3 months and then either aspirin or Plavix alone thereafter. LDL was 109, started on Lovenox for DVT prophylaxis. Tolerating a regular consistency diet. Physical and occupational therapy evaluation completed with recommendations of physical medicine rehabilitation consult. Patient was admitted for comprehensive rehabilitation program.   Review of Systems  Constitutional: Positive for malaise/fatigue. Negative for chills and fever.  HENT: Negative for hearing loss and tinnitus.   Eyes: Negative for blurred vision and double vision.  Respiratory: Negative for cough and shortness of breath.   Cardiovascular: Negative for chest pain, palpitations and leg swelling.  Gastrointestinal: Positive for constipation. Negative for nausea and vomiting.  Genitourinary: Positive for frequency.  Musculoskeletal: Positive for back pain.  Skin: Negative for rash.  Neurological:  Positive for sensory change. Negative for seizures.  Psychiatric/Behavioral: Positive for depression and memory loss.       Anxiety  All other systems reviewed and are negative.  Past Medical History:  Diagnosis Date  . Adenomatous colon polyp 04/2005  . Allergic    Anectine  . ANXIETY 10/13/2007  . BACK PAIN 03/01/2007  . BREAST CANCER, HX OF 12/06/2007  . Cancer [facility])    RIGHT mastectomy with node dissection  . Complication of anesthesia    family aggergy to annectine  . Compression fracture of L4 lumbar vertebra (HCC) 02/2013  . CONSTIPATION 01/06/2009  . DEPRESSION 10/13/2007  . Diverticular disease   . DIVERTICULOSIS, COLON 05/11/2005  . External hemorrhoids without mention of complication 5/88/5027  . FATIGUE 08/07/2008  . HEMORRHOIDS, INTERNAL 05/11/2005  . Hypercalcemia 04/23/2009  . HYPERLIPIDEMIA NEC/NOS 03/01/2007  . HYPERTENSION 10/13/2007  . Impaired fasting glucose 11/14/2008  . INTERSTITIAL CYSTITIS 09/06/2007  . Irritable bowel syndrome 01/06/2009  . LIVER MASS 05/15/2009  . OSTEOPOROSIS 05/09/2009  . PARESTHESIA 09/06/2007  . POLYCYTHEMIA 04/23/2009  . STENOSIS, SPINAL, UNSPC REGION 03/01/2007  . Thyroid disease    Past Surgical History:  Procedure Laterality Date  . ABDOMINAL HYSTERECTOMY    . BACK SURGERY    . BREAST SURGERY     mastectomy -right  . CERVICAL SPINE SURGERY    . CHOLECYSTECTOMY    . COLONOSCOPY W/ BIOPSIES    . cortisone injection  10/16/12   left knee  . FIXATION KYPHOPLASTY LUMBAR SPINE    . HEMORRHOID BANDING  2015-16  . PARATHYROID EXPLORATION  06/29/2011   Procedure: PARATHYROID EXPLORATION;  Surgeon: Earnstine Regal, MD;  Location: [facility];  Service: General;  Laterality: Left;  Left Superior Parathyroidectomy  . PARATHYROIDECTOMY  06/29/11   Family History  Problem Relation Age of Onset  . Heart disease Sister   . Hypertension Sister   . Asthma Sister   . Breast cancer Maternal Aunt   . Ovarian cancer Paternal Aunt   . Colon cancer Neg Hx   .  Esophageal cancer Neg Hx   . Pancreatic cancer Neg Hx   . Rectal cancer Neg Hx   . Stomach cancer Neg Hx    Social History:  reports that she quit smoking about 39 years ago. Her smoking use included Cigarettes. She has a 42.00 pack-year smoking history. She has never used smokeless tobacco. She reports that she does not drink alcohol or use drugs. Allergies:  Allergies  Allergen Reactions  . Anectine [Succinylcholine Chloride] Other (See Comments)    Reaction:  Patient states that her body wasn't functioning.  Pt states she was put on life support.   . Dilaudid [Hydromorphone Hcl] Other (See Comments)    Slept for 3 days and 3 nights  . Hydrocodone-Acetaminophen Other (See Comments)    Hallucinations, paranoia, amnesia Pt takes Tylenol at home  . Iodine Hives and Other (See Comments)    , asthma  . Naproxen Sodium Other (See Comments)    REACTION: Asthma  . Oxycodone Hcl Other (See Comments)     Hallucinations, paranoia, amnesia Pt takes Percocet at home with no issues  . Pentosan Polysulfate Sodium Other (See Comments)    REACTION: unspecified per patient  . Pseudoephedrine Other (See Comments)     Palpitations  . Succinylcholine     REACTION: "Can't wake up".  . Tape Dermatitis    Red inflammed  . Tramadol Hcl Other (See Comments)    Unknown reaction  . Shellfish Allergy     Runny nose, cough, hoarseness   Medications Prior to Admission  Medication Sig Dispense Refill  . acetaminophen (TYLENOL) 500 MG tablet Take 1,000 mg by mouth every 6 (six) hours as needed for mild pain.    Marland Kitchen Alum & Mag Hydroxide-Simeth (GELUSIL PO) Take 1 tablet by mouth daily as needed (acid reflux).    . Ascorbic Acid (VITAMIN C PO) Take 1 tablet by mouth daily as needed (when not eating enough fruit).    Marland Kitchen aspirin 81 MG chewable tablet Chew 1 tablet (81 mg total) by mouth daily. 30 tablet 0  . buPROPion (WELLBUTRIN XL) 300 MG 24 hr tablet Take 300 mg by mouth daily.    Marland Kitchen CALCIUM-MAGNESIUM PO  Take 1 tablet by mouth daily.    . carisoprodol (SOMA) 250 MG tablet Take 1 tablet (250 mg total) by mouth daily as needed (Pt is also on the 350 mg Soma.). For muscle spasms. (Patient taking differently: Take 250 mg by mouth daily as needed (muscle spasms). ) 90 tablet 0  . celecoxib (CELEBREX) 200 MG capsule Take 1 capsule (200 mg total) by mouth daily. (Patient taking differently: Take 200 mg by mouth daily as needed (arthritis pain). ) 90 capsule 3  . Cholecalciferol (VITAMIN D3) 1000 UNITS CAPS Take 1,000 Units by mouth daily.     . Cyanocobalamin (VITAMIN B-12 PO) Take 1 tablet by mouth daily.    . diazepam (VALIUM) 10 MG tablet Take 10 mg by mouth daily as needed for anxiety.    Marland Kitchen estradiol (ESTRACE) 0.1 MG/GM vaginal cream Place 1 Applicatorful vaginally daily as needed (for dryness).     . fluticasone (FLONASE) 50 MCG/ACT nasal spray Place 2 sprays into both nostrils daily. (Patient taking differently: Place 2 sprays into both  nostrils daily as needed for allergies. ) 16 g 2  . hydrOXYzine (VISTARIL) 25 MG capsule Take 25 mg by mouth at bedtime.    . Methenamine-Sodium Salicylate (CYSTEX PO) Take 1 tablet by mouth daily as needed (urinary tract pain).    . Multiple Vitamin (MULTIVITAMIN WITH MINERALS) TABS tablet Take 1 tablet by mouth daily as needed (when not eating well).    Marland Kitchen oxyCODONE-acetaminophen (PERCOCET) 10-325 MG tablet Take 1 tablet by mouth daily as needed for pain.    . pravastatin (PRAVACHOL) 40 MG tablet Take 1 tablet (40 mg total) by mouth daily at 6 PM. 30 tablet 0  . VITAMIN E PO Take 1 capsule by mouth daily.    . Zoledronic Acid (RECLAST IV) Inject into the vein. Yearly injection - last injection March 2018      Home: Home Living Family/patient expects to be discharged to:: Private residence Living Arrangements: Alone Available Help at Discharge: Neighbor, Available PRN/intermittently, Family Type of Home: House Home Access: Stairs to enter Technical brewer  of Steps: 3 Entrance Stairs-Rails: Right, Left Home Layout: Two level, Able to live on main level with bedroom/bathroom Alternate Level Stairs-Number of Steps: neice stays with her 6 hours 4 days a week Bathroom Shower/Tub: Walk-in shower, Tub/shower unit, Other (comment) (likes to get into whirlpool) Biochemist, clinical: Standard Home Equipment: Shower seat - built in, Environmental consultant - 4 wheels  Lives With: Alone   Functional History: Prior Function Level of Independence: Independent, Independent with assistive device(s) Comments: reports that she drives to therapy; uses rollator, has neice that stays with her and helps with IADLs (Pt really wants to be independent)  Functional Status:  Mobility: Bed Mobility Overal bed mobility: Needs Assistance Bed Mobility: Supine to Sit Supine to sit: Min assist General bed mobility comments: received furniture walking to the bathroom with bed alarm going off Transfers Overall transfer level: Needs assistance Equipment used: 1 person hand held assist Transfers: Sit to/from Stand Sit to Stand: Min assist General transfer comment: min assist to power up to standing, min assist for stability in standing Ambulation/Gait Ambulation/Gait assistance: Min assist, Mod assist Ambulation Distance (Feet): 110 Feet (110 min assist with RW; and 20 ft with moderate HHA) Assistive device: 1 person hand held assist Gait Pattern/deviations: Step-to pattern, Trunk flexed, Shuffle, Decreased stride length, Decreased dorsiflexion - right, Decreased dorsiflexion - left General Gait Details: patient continues to demonstrate noted instability during ambulation, some improvements noted in coordination of RLE but remains unsafe and significantly high fall risk. Moderate assist to prevent LOB without device, and min assist with maximal cues for safety and awareness using RW. Gait velocity: decreased    ADL: ADL Overall ADL's : Needs assistance/impaired Eating/Feeding: Set up,  Sitting Grooming: Minimal assistance, Wash/dry hands, Oral care, Standing, Cueing for safety Grooming Details (indicate cue type and reason): Pt's RLE unstable during sink level grooming, Pt provided with vc for safety. Pt needed assist in opening containers for grooming items Upper Body Bathing: Set up, Sitting Lower Body Bathing: Minimal assistance, Sitting/lateral leans, Cueing for safety Upper Body Dressing : Set up, Sitting Lower Body Dressing: Minimal assistance, Sit to/from stand Toilet Transfer: Minimal assistance, Cueing for safety, Ambulation, Comfort height toilet, Grab bars Toilet Transfer Details (indicate cue type and reason): Pt confused when we walked into bathroom, "where is the sink?" had to be reminded of task vc for safety so she would sit on the toilet Toileting- Clothing Manipulation and Hygiene: Sit to/from stand, Sports coach  Manipulation Details (indicate cue type and reason): use of rail for balance Tub/ Shower Transfer: Minimal assistance Functional mobility during ADLs: Minimal assistance, Cueing for safety General ADL Comments: Pt presenting with decrease awareness of deficits and safety and impact ADL as listed above, increase risk of fall   Cognition: Cognition Overall Cognitive Status: History of cognitive impairments - at baseline Arousal/Alertness: Awake/alert Orientation Level: Oriented to person, Oriented to place, Oriented to time, Oriented to situation (x specific date) Attention: Sustained, Selective Sustained Attention: Impaired Selective Attention: Impaired Memory: Impaired Memory Impairment: Retrieval deficit (recalled 4/5 words with category cue, 1/5 from choice of 3) Awareness: Impaired Awareness Impairment: Intellectual impairment (pt admits physically things are harder, denies memory/cognitive changes; uncertain as no family present to provide premorbid findings) Problem Solving: Impaired Problem Solving Impairment: Verbal  basic (pt states she can get up on her own- advised against due to fall risk) Behaviors: Impulsive Safety/Judgment: Impaired Comments: pt's with decreased attention - evaluation was interrupted several times which exacerbated attention impairment Cognition Arousal/Alertness: Awake/alert Behavior During Therapy: Anxious, Restless Overall Cognitive Status: History of cognitive impairments - at baseline Area of Impairment: Safety/judgement, Following commands, Attention, Problem solving Current Attention Level: Alternating Following Commands: Follows one step commands consistently Safety/Judgement: Decreased awareness of safety, Decreased awareness of deficits Problem Solving: Slow processing, Difficulty sequencing, Requires verbal cues General Comments: patient continues to  demonstrate deficts in safety awareness  Physical Exam: Blood pressure (!) 174/71, pulse 88, temperature 98.6 F (37 C), temperature source Oral, resp. rate 17, height 5' 3.75" (1.619 m), weight 71.4 kg (157 lb 6.5 oz), SpO2 95 %. Physical Exam  Vitals reviewed. Constitutional: She appears well-developed and well-nourished.  HENT:  Head: Normocephalic and atraumatic.  Eyes: EOM are normal. Right eye exhibits no discharge.  Neck: Normal range of motion. Neck supple. No thyromegaly present.  Cardiovascular: Normal rate and regular rhythm.   Respiratory: Effort normal and breath sounds normal. No respiratory distress.  GI: Soft. Bowel sounds are normal. She exhibits no distension.  Musculoskeletal: She exhibits no edema or tenderness.  Neurological: She is alert.  Mood is flat but engaging.  Follows simple commands.  Provider name and age.  Fair medical historian Motor: 4+/5 throughout Sensation intact to light touch  Skin: Skin is warm and dry.  Psychiatric: She has a normal mood and affect. Her behavior is normal.   Results for orders placed or performed during the hospital encounter of 01/07/17 (from the past  48 hour(s))  Basic metabolic panel     Status: Abnormal   Collection Time: 01/08/17  8:31 PM  Result Value Ref Range   Sodium 139 135 - 145 mmol/L   Potassium 3.8 3.5 - 5.1 mmol/L   Chloride 102 101 - 111 mmol/L   CO2 27 22 - 32 mmol/L   Glucose, Bld 140 (H) 65 - 99 mg/dL   BUN 14 6 - 20 mg/dL   Creatinine, Ser 1.18 (H) 0.44 - 1.00 mg/dL   Calcium 8.8 (L) 8.9 - 10.3 mg/dL   GFR calc non Af Amer 41 (L) >60 mL/min   GFR calc Af Amer 48 (L) >60 mL/min    Comment: (NOTE) The eGFR has been calculated using the CKD EPI equation. This calculation has not been validated in all clinical situations. eGFR's persistently <60 mL/min signify possible Chronic Kidney Disease.    Anion gap 10 5 - 15  Magnesium     Status: None   Collection Time: 01/08/17  8:31 PM  Result Value  Ref Range   Magnesium 2.2 1.7 - 2.4 mg/dL  CBC     Status: None   Collection Time: 01/09/17  4:31 AM  Result Value Ref Range   WBC 7.2 4.0 - 10.5 K/uL   RBC 4.89 3.87 - 5.11 MIL/uL   Hemoglobin 14.3 12.0 - 15.0 g/dL   HCT 44.3 36.0 - 46.0 %   MCV 90.6 78.0 - 100.0 fL   MCH 29.2 26.0 - 34.0 pg   MCHC 32.3 30.0 - 36.0 g/dL   RDW 12.7 11.5 - 15.5 %   Platelets 174 150 - 400 K/uL  Basic metabolic panel     Status: Abnormal   Collection Time: 01/09/17  4:31 AM  Result Value Ref Range   Sodium 140 135 - 145 mmol/L   Potassium 3.8 3.5 - 5.1 mmol/L   Chloride 102 101 - 111 mmol/L   CO2 27 22 - 32 mmol/L   Glucose, Bld 120 (H) 65 - 99 mg/dL   BUN 15 6 - 20 mg/dL   Creatinine, Ser 1.01 (H) 0.44 - 1.00 mg/dL   Calcium 8.9 8.9 - 10.3 mg/dL   GFR calc non Af Amer 50 (L) >60 mL/min   GFR calc Af Amer 58 (L) >60 mL/min    Comment: (NOTE) The eGFR has been calculated using the CKD EPI equation. This calculation has not been validated in all clinical situations. eGFR's persistently <60 mL/min signify possible Chronic Kidney Disease.    Anion gap 11 5 - 15  CBC     Status: None   Collection Time: 01/10/17  4:26 AM    Result Value Ref Range   WBC 9.0 4.0 - 10.5 K/uL   RBC 5.02 3.87 - 5.11 MIL/uL   Hemoglobin 14.5 12.0 - 15.0 g/dL   HCT 45.9 36.0 - 46.0 %   MCV 91.4 78.0 - 100.0 fL   MCH 28.9 26.0 - 34.0 pg   MCHC 31.6 30.0 - 36.0 g/dL   RDW 12.8 11.5 - 15.5 %   Platelets 176 150 - 400 K/uL  Basic metabolic panel     Status: Abnormal   Collection Time: 01/10/17  4:26 AM  Result Value Ref Range   Sodium 139 135 - 145 mmol/L   Potassium 4.4 3.5 - 5.1 mmol/L   Chloride 103 101 - 111 mmol/L   CO2 27 22 - 32 mmol/L   Glucose, Bld 127 (H) 65 - 99 mg/dL   BUN 16 6 - 20 mg/dL   Creatinine, Ser 0.92 0.44 - 1.00 mg/dL   Calcium 9.3 8.9 - 10.3 mg/dL   GFR calc non Af Amer 56 (L) >60 mL/min   GFR calc Af Amer >60 >60 mL/min    Comment: (NOTE) The eGFR has been calculated using the CKD EPI equation. This calculation has not been validated in all clinical situations. eGFR's persistently <60 mL/min signify possible Chronic Kidney Disease.    Anion gap 9 5 - 15   No results found.     Medical Problem List and Plan: 1.  Gait disorder secondary to Left thalamic infarct as well as history of TIA 2.  DVT Prophylaxis/Anticoagulation: Subcutaneous Lovenox. Monitor platelet counts of any signs of bleeding 3. Pain Management/back pain: Celebrex 200 mg daily as needed, Soma 350 mg daily as needed as prior to admission, Oxycodone immediate release as needed 4. Mood: Wellbutrin XL 300 mg daily, Valium 10 mg daily as needed anxiety as prior to admission 5. Neuropsych: This patient is capable of making  decisions on her own behalf. 6. Skin/Wound Care: Routine skin checks 7. Fluids/Electrolytes/Nutrition: Routine I&O with follow-up chemistries 8. Hypertension. Lopressor 12.5 mg twice a day. Monitor with increased mobility 9. Hyperlipidemia. Pravachol 10. History of right breast cancer with mastectomy   Post Admission Physician Evaluation: 1. Preadmission assessment reviewed and changes made  below. 2. Functional deficits secondary  to left thalamic CVA. 3. Patient is admitted to receive collaborative, interdisciplinary care between the physiatrist, rehab nursing staff, and therapy team. 4. Patient's level of medical complexity and substantial therapy needs in context of that medical necessity cannot be provided at a lesser intensity of care such as a SNF. 5. Patient has experienced substantial functional loss from his/her baseline which was documented above under the "Functional History" and "Functional Status" headings.  Judging by the patient's diagnosis, physical exam, and functional history, the patient has potential for functional progress which will result in measurable gains while on inpatient rehab.  These gains will be of substantial and practical use upon discharge  in facilitating mobility and self-care at the household level. 64. Physiatrist will provide 24 hour management of medical needs as well as oversight of the therapy plan/treatment and provide guidance as appropriate regarding the interaction of the two. 7. 24 hour rehab nursing will assist with safety, disease management and patient education  and help integrate therapy concepts, techniques,education, etc. 8. PT will assess and treat for/with: Lower extremity strength, range of motion, stamina, balance, functional mobility, safety, adaptive techniques and equipment,  coping skills, pain control, stroke education.   Goals are: Mod I. 9. OT will assess and treat for/with: ADL's, functional mobility, safety, upper extremity strength, adaptive techniques and equipment, ego support, and community reintegration.   Goals are: Mod I. Therapy may proceed with showering this patient. 10. Case Management and Social Worker will assess and treat for psychological issues and discharge planning. 11. Team conference will be held weekly to assess progress toward goals and to determine barriers to discharge. 12. Patient will receive at  least 3 hours of therapy per day at least 5 days per week. 13. ELOS: 7-10 days.       14. Prognosis:  good  Delice Lesch, MD, Mellody Drown Cathlyn Parsons., PA-C 01/10/2017

## 2017-01-10 NOTE — Progress Notes (Signed)
I reviewed all literature with pt. and Enid Derry (caregiver/friend).  I fully explained the Pt. Consent and Assignment of Benefits document as well as Medicare Rights.   Pt. Seems to understand content and signed for herself.    [facility] Admissions Coordinator Cell [phone] Office [phone]

## 2017-01-10 NOTE — Evaluation (Signed)
Speech Language Pathology Evaluation Patient Details Name: [name] MRN: [id number] DOB: 31-Dec-1929 Today's Date: 01/10/2017 Time: 8250-0370 SLP Time Calculation (min) (ACUTE ONLY): 39 min  Problem List:  Patient Active Problem List   Diagnosis Date Noted  . Stroke (cerebrum) ([place]) 01/08/2017  . TIA (transient ischemic attack) 12/16/2016  . Dysarthria 12/16/2016  . Leg weakness 12/16/2016  . EKG abnormalities 12/16/2016  . Chronic pain 12/16/2016  . Elevated blood pressure 12/22/2014  . Internal hemorrhoids with bleeding and prolapse 11/04/2014  . Lactose intolerance 11/04/2014  . Hemorrhoid 09/11/2014  . Rectal bleeding 09/11/2014  . IBS (irritable bowel syndrome) 09/25/2013  . Hyperparathyroidism, primary ([place]) 05/26/2011  . Osteoporosis 05/09/2009  . POLYCYTHEMIA 04/23/2009  . IMPAIRED FASTING GLUCOSE 11/14/2008  . BREAST CANCER, HX OF 12/06/2007  . Anxiety state 10/13/2007  . Essential hypertension 10/13/2007  . INTERSTITIAL CYSTITIS 09/06/2007  . HLD (hyperlipidemia) 03/01/2007  . Backache 03/01/2007   Past Medical History:  Past Medical History:  Diagnosis Date  . Adenomatous colon polyp 04/2005  . Allergic    Anectine  . ANXIETY 10/13/2007  . BACK PAIN 03/01/2007  . BREAST CANCER, HX OF 12/06/2007  . Cancer [facility])    RIGHT mastectomy with node dissection  . Complication of anesthesia    family aggergy to annectine  . Compression fracture of L4 lumbar vertebra ([facility]) 02/2013  . CONSTIPATION 01/06/2009  . DEPRESSION 10/13/2007  . Diverticular disease   . DIVERTICULOSIS, COLON 05/11/2005  . External hemorrhoids without mention of complication 4/88/8916  . FATIGUE 08/07/2008  . HEMORRHOIDS, INTERNAL 05/11/2005  . Hypercalcemia 04/23/2009  . HYPERLIPIDEMIA NEC/NOS 03/01/2007  . HYPERTENSION 10/13/2007  . Impaired fasting glucose 11/14/2008  . INTERSTITIAL CYSTITIS 09/06/2007  . Irritable bowel syndrome 01/06/2009  . LIVER MASS 05/15/2009  . OSTEOPOROSIS 05/09/2009   . PARESTHESIA 09/06/2007  . POLYCYTHEMIA 04/23/2009  . STENOSIS, SPINAL, UNSPC REGION 03/01/2007  . Thyroid disease    Past Surgical History:  Past Surgical History:  Procedure Laterality Date  . ABDOMINAL HYSTERECTOMY    . BACK SURGERY    . BREAST SURGERY     mastectomy -right  . CERVICAL SPINE SURGERY    . CHOLECYSTECTOMY    . COLONOSCOPY W/ BIOPSIES    . cortisone injection  10/16/12   left knee  . FIXATION KYPHOPLASTY LUMBAR SPINE    . HEMORRHOID BANDING  2015-16  . PARATHYROID EXPLORATION  06/29/2011   Procedure: PARATHYROID EXPLORATION;  Surgeon: Earnstine Regal, MD;  Location: [facility] ORS;  Service: General;  Laterality: Left;  Left Superior Parathyroidectomy  . PARATHYROIDECTOMY  06/29/11   HPI:  83 y.o. female with medical history significant for untreated hypertension, depression, anxiety, and chronic back pain admitted with right sided weakness and dysarthria.  Pt found to have a left thalamic CVA.   Pt with recent admit for TIA symptoms.  Speech evaluation ordered.  Pt has been seen in the past when she had a TIA and demonstrated mild memory deficits which were presumed to be baseline.    Assessment / Plan / Recommendation Clinical Impression  MOCA 7.2 administered (x written portions) with pt scoring 10/25 points indicating a severe cognitive deficit.  Pt is verbose and required multiple repetitions during testing due to decreased attention.  Pt's strengths included orientation and repetition of digits.  She has significant difficulties with sentence repetition (details absent) and mental subtraction.  Moderate deficits noted in memory (recalled 5/5 words with cues), verbal fluency (named 7/11 words) and abstract thought.  Pt is mildly dysarthric with minimal imprecise articulation due to hypoglossal and trigeminal nerve deficits - however she is intelligible.     Of note, pt reports she has PTSD and she questions its impact on her cognition.    As pt was independent prior to admit,  recommend SLP follow up to maximize her cognitive linguistic skills for safety/QOL.  Pt agreeable to plan and goals reviewed.     SLP Assessment  SLP Recommendation/Assessment: Patient needs continued Speech Lanaguage Pathology Services SLP Visit Diagnosis: Cognitive communication deficit (R41.841)    Follow Up Recommendations  Inpatient Rehab    Frequency and Duration min 1 x/week  1 week      SLP Evaluation Cognition  Arousal/Alertness: Awake/alert Orientation Level: Oriented to person;Oriented to place;Oriented to time;Oriented to situation (x specific date) Attention: Sustained;Selective Sustained Attention: Impaired Selective Attention: Impaired Memory: Impaired Memory Impairment: Retrieval deficit (recalled 4/5 words with category cue, 1/5 from choice of 3) Awareness: Impaired Awareness Impairment: Intellectual impairment (pt admits physically things are harder, denies memory/cognitive changes; uncertain as no family present to provide premorbid findings) Problem Solving: Impaired Problem Solving Impairment: Verbal basic (pt states she can get up on her own- advised against due to fall risk) Behaviors: Impulsive Safety/Judgment: Impaired Comments: pt's with decreased attention - evaluation was interrupted several times which exacerbated attention impairment       Comprehension  Auditory Comprehension Overall Auditory Comprehension: Appears within functional limits for tasks assessed Yes/No Questions: Not tested Commands: Within Functional Limits (for basic) Conversation: Complex Other Conversation Comments: pt benefited from mulitple repetitions of directions during session Interfering Components: Attention;Hearing (? if pt may have mild hearing loss) EffectiveTechniques: Repetition Visual Recognition/Discrimination Discrimination: Not tested Reading Comprehension Reading Status:  (pt read the current date on her phone)    Expression Expression Primary Mode of  Expression: Verbal Verbal Expression Overall Verbal Expression: Appears within functional limits for tasks assessed Initiation: No impairment Level of Generative/Spontaneous Verbalization: Conversation Repetition: Impaired Level of Impairment: Sentence level (multiple word sentence - suspect due to decreased attention and not language deficit) Naming: Impairment (named 2/3 animals correctly on MOCA) Pragmatics:  (pt is verbose, suspect baseline) Non-Verbal Means of Communication: Not applicable Written Expression Dominant Hand: Right Written Expression: Not tested (due to pt's poor positioning in bed due to pain)   Oral / Motor  Oral Motor/Sensory Function Overall Oral Motor/Sensory Function: Mild impairment Facial Strength: Reduced right Facial Sensation: Reduced right Lingual Symmetry: Within Functional Limits Lingual Strength: Within Functional Limits Lingual Sensation: Reduced Velum: Within Functional Limits Mandible: Within Functional Limits Motor Speech Overall Motor Speech: Impaired Respiration: Within functional limits Phonation: Normal Articulation: Impaired Level of Impairment: Word (multisyllabic words with imprecise articulation) Intelligibility: Intelligible Motor Planning: Witnin functional limits Motor Speech Errors: Not applicable   [place], [facility] SLP [phone]

## 2017-01-10 NOTE — Progress Notes (Signed)
[facility] PROGRESS NOTE  [name] WNU:[id number] DOB: Oct 20, 1929 DOA: 01/07/2017  PCP: Seward Carol, MD  Brief History/Interval Summary: 83 year old Caucasian female with a past medical history of a recent TIA on April 26. She was hospitalized at that time and underwent extensive workup. She presented to the emergency department with acute onset of right-sided weakness, numbness and slurred speech. Evaluation revealed stroke at this time. She was hospitalized for further management. No TPA was given due to improving deficits.  Reason for Visit: Acute stroke  Consultants: Neurology  Procedures:  Transthoracic echocardiogram April 27 Study Conclusions  - Left ventricle: The cavity size was normal. Wall thickness was   normal. Systolic function was vigorous. The estimated ejection   fraction was in the range of 65% to 70%. Wall motion was normal;   there were no regional wall motion abnormalities. Doppler   parameters are consistent with abnormal left ventricular   relaxation (grade 1 diastolic dysfunction). - Right ventricle: Systolic function was mildly to moderately   reduced.  Antibiotics: None  Subjective/Interval History: Patient feels well. Does not report any further improvement in her right arm from what she had felt yesterday. Continues to feel numb except in her fingers where she does have some sensation. Strength is stable. Speech has improved.  ROS: Denies chest pain or shortness of breath  Objective:  Vital Signs  Vitals:   01/09/17 1301 01/09/17 1727 01/09/17 2100 01/10/17 0100  BP: (!) 149/65 (!) 169/61 (!) 155/65 (!) 142/68  Pulse: 74 84 77 69  Resp: 20 20 18 17   Temp: 98.8 F (37.1 C) 97.8 F (36.6 C) 98 F (36.7 C) 97.9 F (36.6 C)  TempSrc: Oral Oral Oral Oral  SpO2: 95% 96% 95% 98%  Weight:      Height:        Intake/Output Summary (Last 24 hours) at 01/10/17 0912 Last data filed at 01/10/17 0720  Gross per 24 hour    Intake              240 ml  Output                1 ml  Net              239 ml   Filed Weights   01/07/17 2100  Weight: 71.4 kg (157 lb 6.5 oz)   On 5/19: Telemetry showed one 20 beat episode of nonsustained VT. PVCs noted. None noted last 24 hours  General appearance: Awake, alert. No distress Resp: Clear to auscultation bilaterally. No wheezing, rales or rhonchi Cardio: S1, S2 is normal, regular. No S3, S4. No rubs, murmurs, or bruit GI: Abdomen is soft. Nontender, nondistended. Bowel sounds are present. No masses, organomegaly Extremities: No edema Neurologic: Patient is awake and alert. Oriented 3. No facial asymmetry. Tongue is midline. No pronator drift. Improved strength in right upper extremity.   Lab Results:  Data Reviewed: I have personally reviewed following labs and imaging studies  CBC:  Recent Labs Lab 01/07/17 2143 01/07/17 2152 01/09/17 0431 01/10/17 0426  WBC 9.1  --  7.2 9.0  NEUTROABS 4.5  --   --   --   HGB 14.9 15.0 14.3 14.5  HCT 44.7 44.0 44.3 45.9  MCV 90.5  --  90.6 91.4  PLT 181  --  174 034    Basic Metabolic Panel:  Recent Labs Lab 01/07/17 2143 01/07/17 2152 01/08/17 2031 01/09/17 0431 01/10/17 0426  NA 138 140 139 140 139  K 4.1 4.0 3.8 3.8 4.4  CL 104 103 102 102 103  CO2 25  --  27 27 27   GLUCOSE 112* 112* 140* 120* 127*  BUN 16 17 14 15 16   CREATININE 0.99 1.00 1.18* 1.01* 0.92  CALCIUM 9.2  --  8.8* 8.9 9.3  MG  --   --  2.2  --   --     GFR: Estimated Creatinine Clearance: 43.8 mL/min (by C-G formula based on SCr of 0.92 mg/dL).  Liver Function Tests:  Recent Labs Lab 01/07/17 2143  AST 18  ALT 19  ALKPHOS 63  BILITOT 0.6  PROT 6.6  ALBUMIN 4.2    Coagulation Profile:  Recent Labs Lab 01/07/17 2143  INR 0.99   CBG:  Recent Labs Lab 01/07/17 2143  GLUCAP 101*    Radiology Studies: No results found.   Medications:  Scheduled: .  stroke: mapping our early stages of recovery book   Does  not apply Once  . aspirin EC  81 mg Oral Daily  . buPROPion  300 mg Oral Daily  . clopidogrel  75 mg Oral Daily  . enoxaparin (LOVENOX) injection  40 mg Subcutaneous QHS  . hydrOXYzine  25 mg Oral QHS  . metoprolol tartrate  12.5 mg Oral Daily  . polyethylene glycol  17 g Oral Daily  . pravastatin  40 mg Oral q1800  . senna  1 tablet Oral Daily   Continuous:  ION:[id number] **OR** acetaminophen (TYLENOL) oral liquid 160 mg/5 mL **OR** acetaminophen, carisoprodol, celecoxib, diazepam, fluticasone, multivitamin with minerals, oxyCODONE-acetaminophen **AND** oxyCODONE, sodium phosphate  Assessment/Plan:  Principal Problem:   TIA (transient ischemic attack) Active Problems:   HLD (hyperlipidemia)   Essential hypertension   Stroke (cerebrum) ([place])    Acute stroke Patient is stable. Patient was recently hospitalized for TIA and underwent extensive workup at that time including echocardiogram and MRI. Seen by neurology. LDL was 109. Pravastatin was added during previous hospitalization. Plan is to continue aspirin and Plavix for 3 months and then either aspirin or Plavix alone. Seen by physical and occupational therapy. Inpatient rehabilitation is recommended. Rehab M.D. is following.   NSVT No further episodes since started on beta blocker. Patient was asymptomatic during that episode. Electrolytes are normal. Patient was started on low-dose beta blocker. Echocardiogram showed normal systolic function. Patient mentioned that she has had palpitations for long time and has been seen by cardiology in the past. She was very reluctant to start taking beta blocker, however, was subsequently willing to try low dose.   Hyperlipidemia. Continue pravastatin.  Elevated blood pressure Not on antihypertensives at home. Beta blocker initiated as discussed above. Blood pressure noted to be elevated at times.  Abnormal UA noted. But no symptoms. No need for any antibiotics at this time.  DVT  Prophylaxis: Lovenox    Code Status: Full code  Family Communication: Discussed with the patient Disposition Plan: PT and OT following. Might go to CIR.    LOS: 2 days   [place] [facility] Pager [phone] 01/10/2017, 9:12 AM  If 7PM-7AM, please contact night-coverage at [email or web address], password [facility]

## 2017-01-10 NOTE — Care Management Note (Signed)
Case Management Note  Patient Details  Name: [name] MRN: [id number] Date of Birth: [birth year]  Subjective/Objective:                    Action/Plan: Pt discharging to CIR today. No further needs per CM.   Expected Discharge Date:  01/10/17               Expected Discharge Plan:  [place]  In-House Referral:     Discharge planning Services     Post Acute Care Choice:    Choice offered to:     DME Arranged:    DME Agency:     HH Arranged:    HH Agency:     Status of Service:  Completed, signed off  If discussed at [employer], dates discussed:    Additional Comments:  Pollie Friar, RN 01/10/2017, 12:49 PM

## 2017-01-10 NOTE — Progress Notes (Signed)
Inpatient Rehabilitation  I met with the patient and her friend/caregiver Acie Fredrickson [phone]) regarding the recommendation for IP Rehab.  I provided informational booklets and answered their questions.  Pt. Indicates she would like to come to IP Rehab and [place] is in agreement.  Per pt., her daughter in law Jackalyn Lombard [phone]) is her power of attorney.  Pt. gave me permission to contact [place].  I left a voice mail regarding the plan for CIR and asked her to contact me.  I have spoken with Dr. Maryland Pink and he gives medical clearance for pt. to admit today.  I have updated Jacqualin Combes, RNCM and pt's RN.  Pt. Will admit to CIR later today.  Please call if questions.  [place] Admissions Coordinator Cell [phone] Office [phone]

## 2017-01-10 NOTE — Discharge Summary (Signed)
[facility]  Physician Discharge Summary   Patient ID: [name] MRN: [id number] DOB/AGE: 1930/02/04 83 y.o.  Admit date: 01/07/2017 Discharge date: 01/10/2017  PCP: Seward Carol, MD  DISCHARGE DIAGNOSES:  Principal Problem:   TIA (transient ischemic attack) Active Problems:   HLD (hyperlipidemia)   Essential hypertension   Stroke (cerebrum) (HCC)    DISCHARGE CONDITION: fair  Diet recommendation: Heart healthy  Filed Weights   01/07/17 2100  Weight: 71.4 kg (157 lb 6.5 oz)    INITIAL HISTORY: 83 year old Caucasian female with a past medical history of a recent TIA on April 26. She was hospitalized at that time and underwent extensive workup. She presented to the emergency department with acute onset of right-sided weakness, numbness and slurred speech. Evaluation revealed stroke at this time. She was hospitalized for further management. No TPA was given due to improving deficits.  Consultations:  Neurology  Procedures: Transthoracic echocardiogram April 27 Study Conclusions  - Left ventricle: The cavity size was normal. Wall thickness was normal. Systolic function was vigorous. The estimated ejection fraction was in the range of 65% to 70%. Wall motion was normal; there were no regional wall motion abnormalities. Doppler parameters are consistent with abnormal left ventricular relaxation (grade 1 diastolic dysfunction). - Right ventricle: Systolic function was mildly to moderately reduced.  HOSPITAL COURSE:   Acute stroke MRI was repeated and showed an acute stroke. Patient was recently hospitalized for TIA and underwent extensive workup at that time including echocardiogram and MRI. No significant carotid stenosis was noted. Neurology was consulted. LDL was 109. Pravastatin was added during previous hospitalization. HbA1c 6.4. Plan is to continue aspirin and Plavix for 3 months and then either aspirin or Plavix alone. Seen by  physical and occupational therapy. Inpatient rehabilitation is recommended.   NSVT Patient had one episode of nonsustained VT on 5/19. After discussions with patient, she was started on low-dose beta blocker. Recent echocardiogram showed normal systolic function. Patient was asymptomatic during that episode. No chest pain. Patient mentioned that she has had palpitations for long time and has been seen by cardiology in the past.   Hyperlipidemia. Continue pravastatin.  Elevated blood pressure Not on antihypertensives at home. Beta blocker initiated as discussed above. Blood pressure noted to be elevated at times.  Overall, stable. Okay for discharge to inpatient rehabilitation.    PERTINENT LABS:  The results of significant diagnostics from this hospitalization (including imaging, microbiology, ancillary and laboratory) are listed below for reference.      Labs: Basic Metabolic Panel:  Recent Labs Lab 01/07/17 2143 01/07/17 2152 01/08/17 2031 01/09/17 0431 01/10/17 0426  NA 138 140 139 140 139  K 4.1 4.0 3.8 3.8 4.4  CL 104 103 102 102 103  CO2 25  --  27 27 27   GLUCOSE 112* 112* 140* 120* 127*  BUN 16 17 14 15 16   CREATININE 0.99 1.00 1.18* 1.01* 0.92  CALCIUM 9.2  --  8.8* 8.9 9.3  MG  --   --  2.2  --   --    Liver Function Tests:  Recent Labs Lab 01/07/17 2143  AST 18  ALT 19  ALKPHOS 63  BILITOT 0.6  PROT 6.6  ALBUMIN 4.2   CBC:  Recent Labs Lab 01/07/17 2143 01/07/17 2152 01/09/17 0431 01/10/17 0426  WBC 9.1  --  7.2 9.0  NEUTROABS 4.5  --   --   --   HGB 14.9 15.0 14.3 14.5  HCT 44.7 44.0 44.3 45.9  MCV  90.5  --  90.6 91.4  PLT 181  --  174 176    CBG:  Recent Labs Lab 01/07/17 2143  GLUCAP 101*     IMAGING STUDIES  Mr Brain Wo Contrast  Result Date: 01/08/2017 CLINICAL DATA:  Transient ischemic attack. Acute onset right-sided weakness and slurred speech EXAM: MRI HEAD WITHOUT CONTRAST TECHNIQUE: Multiplanar, multiecho pulse  sequences of the brain and surrounding structures were obtained without intravenous contrast. COMPARISON:  Head CT 01/07/2017 Brain MRI 12/16/2016 FINDINGS: Brain: The midline structures are normal. There is a small focus of diffusion restriction within the lateral left thalamus, measuring 9 x 5 mm. This is in close proximity to the posterior limb of the left internal capsule. There is no acute hemorrhage. There is beginning confluent hyperintense T2-weighted signal within the periventricular, subcortical and deep white matter, most commonly seen in the setting of chronic microvascular ischemia. Chronic microhemorrhage versus mineralization in the left basal ganglia. Brain volume is normal for age without age-advanced or lobar predominant atrophy. The dura is normal and there is no extra-axial collection. Vascular: Major intracranial arterial and venous sinus flow voids are preserved. Skull and upper cervical spine: The visualized skull base, calvarium, upper cervical spine and extracranial soft tissues are normal. Sinuses/Orbits: No fluid levels or advanced mucosal thickening. No mastoid effusion. Normal orbits. IMPRESSION: 1. Acute ischemia within the left thalamus, in close proximity to the posterior limb of the left internal capsule. This is in keeping with the reported right-sided weakness. 2. No hemorrhage or mass effect. 3. Chronic microvascular ischemia. Electronically Signed   By: Ulyses Jarred M.D.   On: 01/08/2017 06:33    Ct Head Code Stroke W/o Cm  Result Date: 01/07/2017 CLINICAL DATA:  Code stroke. RIGHT facial and arm numbness. Slurred speech. History of breast cancer, hypertension, hyperlipidemia and polycythemia. EXAM: CT HEAD WITHOUT CONTRAST TECHNIQUE: Contiguous axial images were obtained from the base of the skull through the vertex without intravenous contrast. COMPARISON:  MRI of the head December 16, 2016 FINDINGS: BRAIN: No intraparenchymal hemorrhage, mass effect nor midline shift. The  ventricles and sulci are normal for age. Patchy supratentorial white matter hypodensities within normal range for patient's age, though non-specific are most compatible with chronic small vessel ischemic disease. 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 No acute large vascular territory infarcts. No abnormal extra-axial fluid collections. Basal cisterns are patent. VASCULAR: Mild calcific atherosclerosis of the carotid siphons. SKULL: No skull fracture. Osteopenia. No significant scalp soft tissue swelling. SINUSES/ORBITS: The mastoid air-cells and included paranasal sinuses are well-aerated.The included ocular globes and orbital contents are non-suspicious. Status post bilateral ocular lens implants. OTHER: None. ASPECTS [facility] Stroke Program Early CT Score) - Ganglionic level infarction (caudate, lentiform nuclei, internal capsule, insula, M1-M3 cortex): 7 - Supraganglionic infarction (M4-M6 cortex): 3 Total score (0-10 with 10 being normal): 10 IMPRESSION: 1. No acute intracranial process. 2. Stable examination including moderate chronic small vessel ischemic disease and old LEFT basal ganglia infarct. 3. ASPECTS is 10. Critical Value/emergent results were called by telephone at the time of interpretation on 01/07/2017 at 9:57 pm to Dr. Nicole Kindred, Neurology , who verbally acknowledged these results. Electronically Signed   By: Elon Alas M.D.   On: 01/07/2017 21:58    DISCHARGE EXAMINATION: See progress note from earlier today. No changes  DISPOSITION: To inpatient rehabilitation  Discharge Instructions    Ambulatory referral to Neurology    Complete by:  As directed    Follow up with stroke clinic Cecille Rubin preferred,  if not available, then consider Caesar Chestnut, [facility] or Jaynee Eagles whoever is available) at [facility] in about 6-8 weeks. Thanks.      ALLERGIES:  Allergies  Allergen Reactions  . Anectine [Succinylcholine Chloride] Other (See Comments)    Reaction:  Patient states that her body wasn't  functioning.  Pt states she was put on life support.   . Dilaudid [Hydromorphone Hcl] Other (See Comments)    Slept for 3 days and 3 nights  . Hydrocodone-Acetaminophen Other (See Comments)    Hallucinations, paranoia, amnesia Pt takes Tylenol at home  . Iodine Hives and Other (See Comments)    , asthma  . Naproxen Sodium Other (See Comments)    REACTION: Asthma  . Oxycodone Hcl Other (See Comments)     Hallucinations, paranoia, amnesia Pt takes Percocet at home with no issues  . Pentosan Polysulfate Sodium Other (See Comments)    REACTION: unspecified per patient  . Pseudoephedrine Other (See Comments)     Palpitations  . Succinylcholine     REACTION: "Can't wake up".  . Tape Dermatitis    Red inflammed  . Tramadol Hcl Other (See Comments)    Unknown reaction  . Shellfish Allergy     Runny nose, cough, hoarseness    Current Inpatient Medications:  Scheduled: .  stroke: mapping our early stages of recovery book   Does not apply Once  . aspirin EC  81 mg Oral Daily  . buPROPion  300 mg Oral Daily  . clopidogrel  75 mg Oral Daily  . enoxaparin (LOVENOX) injection  40 mg Subcutaneous QHS  . hydrOXYzine  25 mg Oral QHS  . metoprolol tartrate  12.5 mg Oral BID  . polyethylene glycol  17 g Oral Daily  . pravastatin  40 mg Oral q1800  . senna  1 tablet Oral Daily   Continuous:  OTL:[id number] **OR** acetaminophen (TYLENOL) oral liquid 160 mg/5 mL **OR** acetaminophen, carisoprodol, celecoxib, diazepam, fluticasone, multivitamin with minerals, oxyCODONE-acetaminophen **AND** oxyCODONE, sodium phosphate   Follow-up Information    Dennie Bible, NP. Schedule an appointment as soon as possible for a visit in 6 week(s).   Specialty:  Family Medicine Contact information: [address] [place] [address] [phone]           TOTAL DISCHARGE TIME: [facility] Pager [phone]  01/10/2017, 1:22 PM

## 2017-01-10 NOTE — PMR Pre-admission (Signed)
PMR Admission Coordinator Pre-Admission Assessment  Patient: [name] is an 83 y.o., female MRN: [id number] DOB: 03-12-1930 Height: 5' 3.75" (161.9 cm) Weight: 71.4 kg (157 lb 6.5 oz)              Insurance Information HMO:     PPO:      PCP:      IPA:      80/20:      OTHER:  PRIMARY: Medicare A and B      Policy#:  [id number]      Subscriber:  self CM Name:  n/a      Phone#:      Fax#:  Pre-Cert#:       Employer: retired Benefits:  Phone #:      Name:  Eff. Date: 03/23/97     Deduct:  $1340      Out of Pocket Max:  n/a      Life Max:  n/a CIR:  100%      SNF:  100% first 20 days Outpatient:  80%     Co-Pay: 20% Home Health:  100%      Co-Pay:   DME:  80%     Co-Pay: 20% Providers:  Pt. choice SECONDARY: BCBS/Federal      Policy#:  [id number]      Subscriber:  CM Name:       Phone#:      Fax#:  Pre-Cert#:       Employer:  Benefits:  Phone #:      Name:  Eff. Date:      Deduct:       Out of Pocket Max:       Life Max:  CIR:       SNF:  Outpatient:      Co-Pay:  Home Health:       Co-Pay:  DME:      Co-Pay:   Medicaid Application Date:       Case Manager:  Disability Application Date:       Case Worker:   Emergency Facilities manager Information    Name Relation Home Work Mobile   Allen,Shirley Friend [phone]     Lawana Pai   [phone]     Current Medical History Patient Admitting Diagnosis: Left thalamic infarct with gait disorder History of Present Illness: 83 year oldRight handed female with history of polycythemia, right breast cancer with mastectomy, hypertension, hyperlipidemia, spinal stenosis, TIA 12/16/2016 maintained on aspirin. Per chart review patient lives alone independently with assistive device prior to admission. She does have a niece that stays with her at times to assist with ADLs. 2 level home bath and bedroom on main level III steps to entry. Acute onset of right-sided weakness and slurred speech and admitted 01/07/2017.  CT/MRI showed acute ischemia within the left thalamus, in close proximity to the posterior limb of left internal capsule. No hemorrhage or mass effect. Echocardiogram showed diastolic dysfunction, but otherwise normal, MRA showed moderate to severe stenosis, left MCA and M2 branches. Patient did not receive TPA. Was started on dual antiplatelet therapy with aspirin and Plavix for 3 months and then either aspirin or Plavix alone thereafter. LDL was 109, started on Lovenox for DVT prophylaxis. Tolerating a regular consistency diet. Physical and occupational therapy evaluation completed with recommendations of physical medicine rehabilitation consult. Patient was admitted for comprehensive rehabilitation program Total: 0 NIH    Past Medical History  Past Medical History:  Diagnosis Date  . Adenomatous colon polyp 04/2005  .  Allergic    Anectine  . ANXIETY 10/13/2007  . BACK PAIN 03/01/2007  . BREAST CANCER, HX OF 12/06/2007  . Cancer [facility])    RIGHT mastectomy with node dissection  . Complication of anesthesia    family aggergy to annectine  . Compression fracture of L4 lumbar vertebra (HCC) 02/2013  . CONSTIPATION 01/06/2009  . DEPRESSION 10/13/2007  . Diverticular disease   . DIVERTICULOSIS, COLON 05/11/2005  . External hemorrhoids without mention of complication 1/61/0960  . FATIGUE 08/07/2008  . HEMORRHOIDS, INTERNAL 05/11/2005  . Hypercalcemia 04/23/2009  . HYPERLIPIDEMIA NEC/NOS 03/01/2007  . HYPERTENSION 10/13/2007  . Impaired fasting glucose 11/14/2008  . INTERSTITIAL CYSTITIS 09/06/2007  . Irritable bowel syndrome 01/06/2009  . LIVER MASS 05/15/2009  . OSTEOPOROSIS 05/09/2009  . PARESTHESIA 09/06/2007  . POLYCYTHEMIA 04/23/2009  . STENOSIS, SPINAL, UNSPC REGION 03/01/2007  . Thyroid disease     Family History  family history includes Asthma in her sister; Breast cancer in her maternal aunt; Heart disease in her sister; Hypertension in her sister; Ovarian cancer in her paternal aunt.  Prior  Rehab/Hospitalizations:  Has the patient had major surgery during 100 days prior to admission? No surgeries; pt. Goes to [facility] currently  Current Medications   Current Facility-Administered Medications:  .   stroke: mapping our early stages of recovery book, , Does not apply, Once, Alcario Drought, Jared M, DO .  acetaminophen (TYLENOL) tablet 650 mg, 650 mg, Oral, Q4H PRN, 650 mg at 01/08/17 1814 **OR** acetaminophen (TYLENOL) solution 650 mg, 650 mg, Per Tube, Q4H PRN **OR** acetaminophen (TYLENOL) suppository 650 mg, 650 mg, Rectal, Q4H PRN, Etta Quill, DO .  aspirin EC tablet 81 mg, 81 mg, Oral, Daily, Bonnielee Haff, MD, 81 mg at 01/10/17 0901 .  buPROPion (WELLBUTRIN XL) 24 hr tablet 300 mg, 300 mg, Oral, Daily, Jennette Kettle M, DO, 300 mg at 01/10/17 0901 .  carisoprodol (SOMA) tablet 350 mg, 350 mg, Oral, Daily PRN, Etta Quill, DO, 350 mg at 01/09/17 2222 .  celecoxib (CELEBREX) capsule 200 mg, 200 mg, Oral, Daily PRN, Etta Quill, DO .  clopidogrel (PLAVIX) tablet 75 mg, 75 mg, Oral, Daily, Bonnielee Haff, MD, 75 mg at 01/10/17 0901 .  diazepam (VALIUM) tablet 10 mg, 10 mg, Oral, Daily PRN, Etta Quill, DO .  enoxaparin (LOVENOX) injection 40 mg, 40 mg, Subcutaneous, QHS, Gardner, Jared M, DO, 40 mg at 01/09/17 2215 .  fluticasone (FLONASE) 50 MCG/ACT nasal spray 2 spray, 2 spray, Each Nare, Daily PRN, Etta Quill, DO .  hydrOXYzine (ATARAX/VISTARIL) tablet 25 mg, 25 mg, Oral, QHS, Gardner, Jared M, DO, 25 mg at 01/09/17 2215 .  metoprolol tartrate (LOPRESSOR) tablet 12.5 mg, 12.5 mg, Oral, BID, Bonnielee Haff, MD .  multivitamin with minerals tablet 1 tablet, 1 tablet, Oral, Daily PRN, Alcario Drought, Jared M, DO .  oxyCODONE-acetaminophen (PERCOCET/ROXICET) 5-325 MG per tablet 1 tablet, 1 tablet, Oral, Daily PRN **AND** oxyCODONE (Oxy IR/ROXICODONE) immediate release tablet 5 mg, 5 mg, Oral, Daily PRN, Alcario Drought, Jared M, DO .   polyethylene glycol (MIRALAX / GLYCOLAX) packet 17 g, 17 g, Oral, Daily, Bonnielee Haff, MD, 17 g at 01/10/17 0901 .  pravastatin (PRAVACHOL) tablet 40 mg, 40 mg, Oral, q1800, Etta Quill, DO, 40 mg at 01/09/17 1719 .  senna (SENOKOT) tablet 8.6 mg, 1 tablet, Oral, Daily, Bonnielee Haff, MD, 8.6 mg at 01/10/17 0901 .  sodium phosphate (FLEET) 7-19 GM/118ML enema 1 enema, 1 enema, Rectal, Daily  PRN, Bonnielee Haff, MD, 1 enema at 01/09/17 1040  Patients Current Diet: Diet Heart Room service appropriate? Yes; Fluid consistency: Thin  Precautions / Restrictions Precautions Precautions: Fall Restrictions Weight Bearing Restrictions: No   Has the patient had 2 or more falls or a fall with injury in the past year?Yes  Prior Activity Level Limited Community (1-2x/wk): Pt. goes out of the home 1-2 times per week with her friend/caregiver Acie Fredrickson for appointments, errands  Home Assistive Devices / [place] Devices/Equipment: None Home Equipment: Shower seat - built in, [place] - 4 wheels  Prior Device Use: Indicate devices/aids used by the patient prior to current illness, exacerbation or injury? Walker  Prior Functional Level Prior Function Level of Independence: Independent, Independent with assistive device(s) Comments: reports that she drives to therapy; uses rollator, has neice that stays with her and helps with IADLs (Pt really wants to be independent)  Self Care: Did the patient need help bathing, dressing, using the toilet or eating?  Needed some help to get into and out of her sunken tub  Indoor Mobility: Did the patient need assistance with walking from room to room (with or without device)? Independent  Stairs: Did the patient need assistance with internal or external stairs (with or without device)? Independent  Functional Cognition: Did the patient need help planning regular tasks such as shopping or remembering to take medications? Needed some  help and occasional reminders to take meds  Current Functional Level Cognition  Arousal/Alertness: Awake/alert Overall Cognitive Status: History of cognitive impairments - at baseline Current Attention Level: Alternating Orientation Level: Oriented to person, Oriented to place, Oriented to time, Oriented to situation (x specific date) Following Commands: Follows one step commands consistently Safety/Judgement: Decreased awareness of safety, Decreased awareness of deficits General Comments: Pt demonstrating decreased understanding of deficits and decreased safety awareness during activities of daily living. She requires increased time for processing.  Attention: Sustained, Selective Sustained Attention: Impaired Selective Attention: Impaired Memory: Impaired Memory Impairment: Retrieval deficit (recalled 4/5 words with category cue, 1/5 from choice of 3) Awareness: Impaired Awareness Impairment: Intellectual impairment (pt admits physically things are harder, denies memory/cognitive changes; uncertain as no family present to provide premorbid findings) Problem Solving: Impaired Problem Solving Impairment: Verbal basic (pt states she can get up on her own- advised against due to fall risk) Behaviors: Impulsive Safety/Judgment: Impaired Comments: pt's with decreased attention - evaluation was interrupted several times which exacerbated attention impairment    Extremity Assessment (includes Sensation/Coordination)  Upper Extremity Assessment: RUE deficits/detail, Generalized weakness RUE Deficits / Details: ataxic movement during ADL, poor dept perception and proprioception, RUE lags during forward flexion, decreased coordination - gross and fine motor RUE Sensation: decreased light touch, decreased proprioception RUE Coordination: decreased fine motor, decreased gross motor  Lower Extremity Assessment: Defer to PT evaluation RLE Deficits / Details: strength WFL, poor proprioception and  coordination of RLE during activity RLE Sensation: decreased light touch, decreased proprioception RLE Coordination: decreased fine motor, decreased gross motor    ADLs  Overall ADL's : Needs assistance/impaired Eating/Feeding: Set up, Sitting Grooming: Oral care, Minimal assistance, Standing Grooming Details (indicate cue type and reason): R lateral lean throughout with minimal awareness of this.  Upper Body Bathing: Set up, Sitting Lower Body Bathing: Minimal assistance, Sitting/lateral leans, Cueing for safety Upper Body Dressing : Set up, Sitting Lower Body Dressing: Sit to/from stand, Moderate assistance Lower Body Dressing Details (indicate cue type and reason): Mod assist to coordinate and plan movements related to donning  underwear and pants.  Toilet Transfer: Minimal assistance, Cueing for safety, Ambulation, BSC Toilet Transfer Details (indicate cue type and reason): Pt requiring verbal cues for safe sitting and technique. Min assist for stability as pt with significant R lateral lean. Toileting- Clothing Manipulation and Hygiene: Minimal assistance, Sit to/from stand Toileting - Clothing Manipulation Details (indicate cue type and reason): use of rail for balance Tub/ Shower Transfer: Minimal assistance Functional mobility during ADLs: Minimal assistance, Cueing for safety General ADL Comments: Pt attempting to hold onto rolling table despite education that this is not safe.    Mobility  Overal bed mobility: Needs Assistance Bed Mobility: Supine to Sit Supine to sit: Min guard Sit to supine: Min guard General bed mobility comments: Min guard assist for safety.     Transfers  Overall transfer level: Needs assistance Equipment used: 1 person hand held assist Transfers: Sit to/from Stand Sit to Stand: Min assist General transfer comment: Min assist for stability in standing.     Ambulation / Gait / Stairs / Wheelchair Mobility  Ambulation/Gait Ambulation/Gait assistance:  Min assist, Mod assist Ambulation Distance (Feet): 110 Feet (110 min assist with RW; and 20 ft with moderate HHA) Assistive device: 1 person hand held assist Gait Pattern/deviations: Step-to pattern, Trunk flexed, Shuffle, Decreased stride length, Decreased dorsiflexion - right, Decreased dorsiflexion - left General Gait Details: patient continues to demonstrate noted instability during ambulation, some improvements noted in coordination of RLE but remains unsafe and significantly high fall risk. Moderate assist to prevent LOB without device, and min assist with maximal cues for safety and awareness using RW. Gait velocity: decreased    Posture / Balance Balance Overall balance assessment: Needs assistance Sitting-balance support: No upper extremity supported, Feet supported Sitting balance-Leahy Scale: Good Standing balance support: Single extremity supported, During functional activity Standing balance-Leahy Scale: Poor Standing balance comment: Relies on external assist or single UE support for stability.     Special needs/care consideration BiPAP/CPAP   no CPM  no Continuous Drip IV   no Dialysis   no        Life Vest   no Oxygen   no Special Bed   no Trach Size  n/a Wound Vac (area)  no      Skin    WDLs per nursing assessment                               Bowel mgmt:last BM 01/10/17 continent Bladder mgmt: continent Diabetic mgmt  n/a     Previous Home Environment Living Arrangements: Alone  Lives With: Alone Available Help at Discharge: Neighbor, Available PRN/intermittently, Family Type of Home: House Home Layout: Two level, Able to live on main level with bedroom/bathroom Alternate Level Stairs-Number of Steps: neice stays with her 6 hours 4 days a week Home Access: Stairs to enter Entrance Stairs-Rails: Right, Left Entrance Stairs-Number of Steps: 3 Bathroom Shower/Tub: Walk-in shower, Tub/shower unit, Other (comment) (likes to get into whirlpool) Bathroom Toilet:  [place]: No  Discharge Living Setting Plans for Discharge Living Setting: Patient's home Type of Home at Discharge: House Discharge Home Layout: Two level, Able to live on main level with bedroom/bathroom Discharge Home Access: Stairs to enter Entrance Stairs-Rails: Right, Left Entrance Stairs-Number of Steps: 2 Discharge Bathroom Shower/Tub: Walk-in shower Discharge Bathroom Toilet: Standard Discharge Bathroom Accessibility: Yes How Accessible: Accessible via walker Does the patient have any problems obtaining your medications?: No  Social/Family/Support Systems  Patient Roles: Other (Comment) (has 2 nephews, one local and one in [place]) Anticipated Caregiver: Acie Fredrickson (friend/caregiver 6 hours per day M-F) Anticipated Caregiver's Contact Information:  Acie Fredrickson [phone]; Jackalyn Lombard (pt names her at [facility]) [phone] Ability/Limitations of Caregiver: care is available 6 hours per day.  Enid Derry seems knowledgable regarding pt's needs and seems involved Caregiver Availability: Intermittent Discharge Plan Discussed with Primary Caregiver: Yes Is Caregiver In Agreement with Plan?: Yes Does Caregiver/Family have Issues with Lodging/Transportation while Pt is in Rehab?: No   Goals/Additional Needs Patient/Family Goal for Rehab: modified independent PT/OT; supervision SLP Expected length of stay: 7 days Cultural Considerations: "Catholic" Dietary Needs: heart dite, thin liquids Equipment Needs: TBA Additional Information: Pt. says her nephew Elita Boone is local but did not want me to reach out to him.  She requested I share info with her caregiver Enid Derry who was at the bedside.  She shared that Jackalyn Lombard is her POA and is the wife of her nephew in [place]. With patient's permission,  I left a voice mail for Helene Kelp to call me back.   Pt/Family Agrees to Admission and willing to participate: Yes Program Orientation Provided & Reviewed with  Pt/Caregiver Including Roles  & Responsibilities: Yes   Decrease burden of Care through IP rehab admission: na/   Possible need for SNF placement upon discharge: not anticipated   Patient Condition: This patient's condition remains as documented in the consult dated 01/10/17 , in which the Rehabilitation Physician determined and documented that the patient's condition is appropriate for intensive rehabilitative care in an inpatient rehabilitation facility. Will admit to inpatient rehab today.  Preadmission Screen Completed By:  Gerlean Ren, 01/10/2017 3:38 PM ______________________________________________________________________   Discussed status with Dr.  Posey Pronto on 01/10/17 at  1538  and received telephone approval for admission today.  Admission Coordinator:  Gerlean Ren, time 1538 Sudie Grumbling 01/10/17

## 2017-01-10 NOTE — Progress Notes (Signed)
Occupational Therapy Treatment Patient Details Name: [name] MRN: [id number] DOB: 03-Oct-1929 Today's Date: 01/10/2017    History of present illness 83 y.o. female with medical history significant of TIA on April 26th.  Work up demonstrates CVD with disease including mod-severe stenosis of M2 segment of L MCA.  Patient presents to the ED after acute onset of R sided weakness, numbness, and slurred speech, MRI revealed Acute ischemia within the left thalamus, in close proximity to the internal capsule.   OT comments  Pt progressing well toward OT goals. She continues to demonstrate decreased R UE coordination and sensation with decreased ability to cross midline and required min-mod assist with dressing and grooming tasks. Pt requires verbal cues to incorporate R UE into bimanual tasks as well. During standing grooming tasks at the sink, pt demonstrates R lateral lean and poor dynamic standing balance requiring min assist to complete. Continue to feel that pt is an excellent candidate for CIR level therapies post-acute D/C in order to maximize return to PLOF. OT will continue to follow while admitted.    Follow Up Recommendations  CIR;Supervision/Assistance - 24 hour    Equipment Recommendations  Other (comment) (defer to next venue of care)    Recommendations for Other Services Rehab consult    Precautions / Restrictions Precautions Precautions: Fall Restrictions Weight Bearing Restrictions: No       Mobility Bed Mobility Overal bed mobility: Needs Assistance Bed Mobility: Supine to Sit     Supine to sit: Min guard Sit to supine: Min guard   General bed mobility comments: Min guard assist for safety.   Transfers Overall transfer level: Needs assistance Equipment used: 1 person hand held assist Transfers: Sit to/from Stand Sit to Stand: Min assist         General transfer comment: Min assist for stability in standing.     Balance Overall balance assessment:  Needs assistance Sitting-balance support: No upper extremity supported;Feet supported Sitting balance-Leahy Scale: Good     Standing balance support: Single extremity supported;During functional activity Standing balance-Leahy Scale: Poor Standing balance comment: Relies on external assist or single UE support for stability.                            ADL either performed or assessed with clinical judgement   ADL Overall ADL's : Needs assistance/impaired     Grooming: Oral care;Minimal assistance;Standing Grooming Details (indicate cue type and reason): R lateral lean throughout with minimal awareness of this.              Lower Body Dressing: Sit to/from stand;Moderate assistance Lower Body Dressing Details (indicate cue type and reason): Mod assist to coordinate and plan movements related to donning underwear and pants.  Toilet Transfer: Minimal assistance;Cueing for safety;Ambulation;BSC Toilet Transfer Details (indicate cue type and reason): Pt requiring verbal cues for safe sitting and technique. Min assist for stability as pt with significant R lateral lean. Toileting- Clothing Manipulation and Hygiene: Minimal assistance;Sit to/from stand       Functional mobility during ADLs: Minimal assistance;Cueing for safety General ADL Comments: Pt attempting to hold onto rolling table despite education that this is not safe.     Vision   Vision Assessment?: No apparent visual deficits   Perception     Praxis      Cognition Arousal/Alertness: Awake/alert Behavior During Therapy: Anxious Overall Cognitive Status: History of cognitive impairments - at baseline Area of Impairment: Safety/judgement;Following commands;Attention;Problem solving  Current Attention Level: Alternating   Following Commands: Follows one step commands consistently Safety/Judgement: Decreased awareness of safety;Decreased awareness of deficits   Problem  Solving: Slow processing;Difficulty sequencing;Requires verbal cues General Comments: Pt demonstrating decreased understanding of deficits and decreased safety awareness during activities of daily living. She requires increased time for processing.         Exercises     Shoulder Instructions       General Comments      Pertinent Vitals/ Pain       Pain Assessment: Faces Faces Pain Scale: Hurts a little bit Pain Location: coccyx Pain Descriptors / Indicators: Aching Pain Intervention(s): Monitored during session;Repositioned  Home Living                                          Prior Functioning/Environment              Frequency  Min 3X/week        Progress Toward Goals  OT Goals(current goals can now be found in the care plan section)  Progress towards OT goals: Progressing toward goals  Acute Rehab OT Goals Patient Stated Goal: go to rehab today OT Goal Formulation: With patient Time For Goal Achievement: 01/20/17 Potential to Achieve Goals: Good ADL Goals Pt Will Perform Grooming: with modified independence;standing Pt Will Perform Upper Body Bathing: with modified independence;standing Pt Will Perform Lower Body Bathing: with modified independence;sit to/from stand Pt Will Transfer to Toilet: with modified independence;ambulating Pt Will Perform Toileting - Clothing Manipulation and hygiene: with modified independence;sit to/from stand Additional ADL Goal #1: Pt will recall 3 ways to conserve energy during ADL with one or less verbal cues  Plan Discharge plan remains appropriate    Co-evaluation                 AM-PAC PT "6 Clicks" Daily Activity     Outcome Measure   Help from another person eating meals?: A Little Help from another person taking care of personal grooming?: A Little Help from another person toileting, which includes using toliet, bedpan, or urinal?: A Little Help from another person bathing (including  washing, rinsing, drying)?: A Little Help from another person to put on and taking off regular upper body clothing?: A Little Help from another person to put on and taking off regular lower body clothing?: A Lot 6 Click Score: 17    End of Session Equipment Utilized During Treatment: Gait belt  OT Visit Diagnosis: Unsteadiness on feet (R26.81);Other symptoms and signs involving the nervous system (R29.898);Ataxia, unspecified (R27.0);Other abnormalities of gait and mobility (R26.89)   Activity Tolerance Patient tolerated treatment well   Patient Left in bed;with call bell/phone within reach;with family/visitor present;with bed alarm set   Nurse Communication Mobility status        Time: 5093-2671 OT Time Calculation (min): 37 min  Charges: OT General Charges $OT Visit: 1 Procedure OT Treatments $Self Care/Home Management : 23-37 mins  Norman Herrlich, MS OTR/L  Pager: [place] 01/10/2017, 3:00 PM

## 2017-01-10 NOTE — Progress Notes (Signed)
Patient arrived on unit from 38M at 38

## 2017-01-10 NOTE — Progress Notes (Signed)
Patient given discharge instructions.  All questions and concerns addressed. Patient left unit by wheelchair with all belongings.

## 2017-01-11 ENCOUNTER — Inpatient Hospital Stay (HOSPITAL_COMMUNITY): Payer: Medicare Other | Admitting: Physical Therapy

## 2017-01-11 ENCOUNTER — Inpatient Hospital Stay (HOSPITAL_COMMUNITY): Payer: Medicare Other | Admitting: Speech Pathology

## 2017-01-11 ENCOUNTER — Inpatient Hospital Stay (HOSPITAL_COMMUNITY): Payer: Medicare Other | Admitting: Occupational Therapy

## 2017-01-11 DIAGNOSIS — I6322 Cerebral infarction due to unspecified occlusion or stenosis of basilar arteries: Secondary | ICD-10-CM

## 2017-01-11 DIAGNOSIS — I63212 Cerebral infarction due to unspecified occlusion or stenosis of left vertebral arteries: Secondary | ICD-10-CM

## 2017-01-11 DIAGNOSIS — I69398 Other sequelae of cerebral infarction: Secondary | ICD-10-CM

## 2017-01-11 DIAGNOSIS — R269 Unspecified abnormalities of gait and mobility: Secondary | ICD-10-CM

## 2017-01-11 LAB — CBC WITH DIFFERENTIAL/PLATELET
Basophils Absolute: 0 10*3/uL (ref 0.0–0.1)
Basophils Relative: 1 %
Eosinophils Absolute: 0.2 10*3/uL (ref 0.0–0.7)
Eosinophils Relative: 2 %
HEMATOCRIT: 42.5 % (ref 36.0–46.0)
Hemoglobin: 13.9 g/dL (ref 12.0–15.0)
LYMPHS ABS: 3.4 10*3/uL (ref 0.7–4.0)
Lymphocytes Relative: 48 %
MCH: 29.6 pg (ref 26.0–34.0)
MCHC: 32.7 g/dL (ref 30.0–36.0)
MCV: 90.4 fL (ref 78.0–100.0)
Monocytes Absolute: 0.5 10*3/uL (ref 0.1–1.0)
Monocytes Relative: 7 %
NEUTROS ABS: 2.9 10*3/uL (ref 1.7–7.7)
NEUTROS PCT: 42 %
Platelets: 165 10*3/uL (ref 150–400)
RBC: 4.7 MIL/uL (ref 3.87–5.11)
RDW: 12.5 % (ref 11.5–15.5)
WBC: 7.1 10*3/uL (ref 4.0–10.5)

## 2017-01-11 LAB — COMPREHENSIVE METABOLIC PANEL
ALK PHOS: 56 U/L (ref 38–126)
ALT: 17 U/L (ref 14–54)
AST: 17 U/L (ref 15–41)
Albumin: 3.7 g/dL (ref 3.5–5.0)
Anion gap: 9 (ref 5–15)
BILIRUBIN TOTAL: 0.4 mg/dL (ref 0.3–1.2)
BUN: 19 mg/dL (ref 6–20)
CALCIUM: 8.9 mg/dL (ref 8.9–10.3)
CO2: 27 mmol/L (ref 22–32)
CREATININE: 0.93 mg/dL (ref 0.44–1.00)
Chloride: 102 mmol/L (ref 101–111)
GFR calc non Af Amer: 55 mL/min — ABNORMAL LOW (ref >60)
GLUCOSE: 115 mg/dL — AB (ref 65–99)
Potassium: 3.7 mmol/L (ref 3.5–5.1)
Sodium: 138 mmol/L (ref 135–145)
TOTAL PROTEIN: 5.9 g/dL — AB (ref 6.5–8.1)

## 2017-01-11 MED ORDER — DICLOFENAC SODIUM 1 % TD GEL
2.0000 g | Freq: Four times a day (QID) | TRANSDERMAL | Status: DC
  Administered 2017-01-11 – 2017-01-18 (×26): 2 g via TOPICAL
  Filled 2017-01-11: qty 100

## 2017-01-11 NOTE — Progress Notes (Signed)
Patient is refusing to eat or order food for meals. She does not like heart healthy diet as it limits her food choices. Will liberalize to low salt to help with intake

## 2017-01-11 NOTE — Progress Notes (Signed)
Selena Blake, MD Physician Addendum Physical Medicine and Rehabilitation  Consult Note Date of Service: 01/09/2017 12:23 PM  Related encounter: ED to Hosp-Admission (Discharged) from 01/07/2017 in [facility] All Collapse All   [] Hide copied text [] Hover for attribution information  Physical Medicine and Rehabilitation Consult Reason for Consult: Rehabilitation for left thalamic infarct Referring Phsyician: Dr Marilynn Rail is an 83 y.o. female.   HPI: 83 year old female with history of hypertension, hyperlipidemia, spinal stenosis, onset of right-sided weakness and slurred speech, 01/07/2017. Echocardiogram showed diastolic dysfunction, but otherwise normal, MRA showed moderate to severe stenosis, left MCA and M2 branches. Was started on dual antiplatelet therapy with aspirin and Plavix for 3 months and then either aspirin or Plavix alone thereafter. LDL was 109, started on Lovenox for DVT. The prophylaxis. Patient was independent with ambulation and mobility prior to admission. OT evaluation demonstrated currently min assist for ADL and mobility. Ambulation distance 110 feet, min assist with rolling walker. Patient states that she has regained the sensation in her right hand today. She is very pleased with her rapid progress.  Review of Systems  Constitutional: Negative for chills, fever and weight loss.  HENT: Negative.   Eyes: Negative.   Respiratory: Negative for cough, shortness of breath and wheezing.   Cardiovascular: Negative for chest pain and leg swelling.  Gastrointestinal: Positive for constipation. Negative for abdominal pain, nausea and vomiting.  Genitourinary: Positive for frequency. Negative for dysuria.  Musculoskeletal: Negative for back pain, joint pain, myalgias and neck pain.  Skin: Negative.   Neurological: Positive for sensory change and focal weakness.  Endo/Heme/Allergies: Negative.     Psychiatric/Behavioral: Negative for depression and substance abuse. The patient does not have insomnia.         Past Medical History:  Diagnosis Date  . Adenomatous colon polyp 04/2005  . Allergic    Anectine  . ANXIETY 10/13/2007  . BACK PAIN 03/01/2007  . BREAST CANCER, HX OF 12/06/2007  . Cancer [facility])    RIGHT mastectomy with node dissection  . Complication of anesthesia    family aggergy to annectine  . Compression fracture of L4 lumbar vertebra (HCC) 02/2013  . CONSTIPATION 01/06/2009  . DEPRESSION 10/13/2007  . Diverticular disease   . DIVERTICULOSIS, COLON 05/11/2005  . External hemorrhoids without mention of complication 0/08/7492  . FATIGUE 08/07/2008  . HEMORRHOIDS, INTERNAL 05/11/2005  . Hypercalcemia 04/23/2009  . HYPERLIPIDEMIA NEC/NOS 03/01/2007  . HYPERTENSION 10/13/2007  . Impaired fasting glucose 11/14/2008  . INTERSTITIAL CYSTITIS 09/06/2007  . Irritable bowel syndrome 01/06/2009  . LIVER MASS 05/15/2009  . OSTEOPOROSIS 05/09/2009  . PARESTHESIA 09/06/2007  . POLYCYTHEMIA 04/23/2009  . STENOSIS, SPINAL, UNSPC REGION 03/01/2007  . Thyroid disease         Past Surgical History:  Procedure Laterality Date  . ABDOMINAL HYSTERECTOMY    . BACK SURGERY    . BREAST SURGERY     mastectomy -right  . CERVICAL SPINE SURGERY    . CHOLECYSTECTOMY    . COLONOSCOPY W/ BIOPSIES    . cortisone injection  10/16/12   left knee  . FIXATION KYPHOPLASTY LUMBAR SPINE    . HEMORRHOID BANDING  2015-16  . PARATHYROID EXPLORATION  06/29/2011   Procedure: PARATHYROID EXPLORATION;  Surgeon: Earnstine Regal, MD;  Location: [facility];  Service: General;  Laterality: Left;  Left Superior Parathyroidectomy  . PARATHYROIDECTOMY  06/29/11        Family History  Problem Relation Age of Onset  . Heart disease Sister   . Hypertension Sister   . Asthma Sister   . Breast cancer Maternal Aunt   . Ovarian cancer Paternal Aunt   . Colon cancer Neg Hx   . Esophageal  cancer Neg Hx   . Pancreatic cancer Neg Hx   . Rectal cancer Neg Hx   . Stomach cancer Neg Hx    Social History:  reports that she quit smoking about 39 years ago. Her smoking use included Cigarettes. She has a 42.00 pack-year smoking history. She has never used smokeless tobacco. She reports that she does not drink alcohol or use drugs. Allergies:       Allergies  Allergen Reactions  . Anectine [Succinylcholine Chloride] Other (See Comments)    Reaction:  Patient states that her body wasn't functioning.  Pt states she was put on life support.   . Dilaudid [Hydromorphone Hcl] Other (See Comments)    Slept for 3 days and 3 nights  . Hydrocodone-Acetaminophen Other (See Comments)    Hallucinations, paranoia, amnesia Pt takes Tylenol at home  . Iodine Hives and Other (See Comments)    , asthma  . Naproxen Sodium Other (See Comments)    REACTION: Asthma  . Oxycodone Hcl Other (See Comments)     Hallucinations, paranoia, amnesia Pt takes Percocet at home with no issues  . Pentosan Polysulfate Sodium Other (See Comments)    REACTION: unspecified per patient  . Pseudoephedrine Other (See Comments)     Palpitations  . Succinylcholine     REACTION: "Can't wake up".  . Tape Dermatitis    Red inflammed  . Tramadol Hcl Other (See Comments)    Unknown reaction  . Shellfish Allergy     Runny nose, cough, hoarseness         Medications Prior to Admission  Medication Sig Dispense Refill  . acetaminophen (TYLENOL) 500 MG tablet Take 1,000 mg by mouth every 6 (six) hours as needed for mild pain.    Marland Kitchen Alum & Mag Hydroxide-Simeth (GELUSIL PO) Take 1 tablet by mouth daily as needed (acid reflux).    . Ascorbic Acid (VITAMIN C PO) Take 1 tablet by mouth daily as needed (when not eating enough fruit).    Marland Kitchen aspirin 81 MG chewable tablet Chew 1 tablet (81 mg total) by mouth daily. 30 tablet 0  . buPROPion (WELLBUTRIN XL) 300 MG 24 hr tablet Take 300 mg by  mouth daily.    Marland Kitchen CALCIUM-MAGNESIUM PO Take 1 tablet by mouth daily.    . carisoprodol (SOMA) 250 MG tablet Take 1 tablet (250 mg total) by mouth daily as needed (Pt is also on the 350 mg Soma.). For muscle spasms. (Patient taking differently: Take 250 mg by mouth daily as needed (muscle spasms). ) 90 tablet 0  . celecoxib (CELEBREX) 200 MG capsule Take 1 capsule (200 mg total) by mouth daily. (Patient taking differently: Take 200 mg by mouth daily as needed (arthritis pain). ) 90 capsule 3  . Cholecalciferol (VITAMIN D3) 1000 UNITS CAPS Take 1,000 Units by mouth daily.     . Cyanocobalamin (VITAMIN B-12 PO) Take 1 tablet by mouth daily.    . diazepam (VALIUM) 10 MG tablet Take 10 mg by mouth daily as needed for anxiety.    Marland Kitchen estradiol (ESTRACE) 0.1 MG/GM vaginal cream Place 1 Applicatorful vaginally daily as needed (for dryness).     . fluticasone (FLONASE) 50 MCG/ACT nasal spray Place 2 sprays into  both nostrils daily. (Patient taking differently: Place 2 sprays into both nostrils daily as needed for allergies. ) 16 g 2  . hydrOXYzine (VISTARIL) 25 MG capsule Take 25 mg by mouth at bedtime.    . Methenamine-Sodium Salicylate (CYSTEX PO) Take 1 tablet by mouth daily as needed (urinary tract pain).    . Multiple Vitamin (MULTIVITAMIN WITH MINERALS) TABS tablet Take 1 tablet by mouth daily as needed (when not eating well).    Marland Kitchen oxyCODONE-acetaminophen (PERCOCET) 10-325 MG tablet Take 1 tablet by mouth daily as needed for pain.    . pravastatin (PRAVACHOL) 40 MG tablet Take 1 tablet (40 mg total) by mouth daily at 6 PM. 30 tablet 0  . VITAMIN E PO Take 1 capsule by mouth daily.    . Zoledronic Acid (RECLAST IV) Inject into the vein. Yearly injection - last injection March 2018      Home: Home Living Family/patient expects to be discharged to:: Private residence Living Arrangements: Alone Available Help at Discharge: Neighbor, Available PRN/intermittently, Family Type of  Home: House Home Access: Stairs to enter Technical brewer of Steps: 3 Entrance Stairs-Rails: Right, Left Home Layout: Two level, Able to live on main level with bedroom/bathroom Alternate Level Stairs-Number of Steps: neice stays with her 6 hours 4 days a week Bathroom Shower/Tub: Walk-in shower, Tub/shower unit, Other (comment) (likes to get into whirlpool) Biochemist, clinical: Standard Home Equipment: Shower seat - built in, Environmental consultant - 4 wheels  Lives With: Alone  Functional History: Prior Function Comments: reports that she drives to therapy; uses rollator, has neice that stays with her and helps with IADLs (Pt really wants to be independent) Functional Status:  Mobility: Ambulation/Gait Ambulation Distance (Feet): 110 Feet (110 min assist with RW; and 20 ft with moderate HHA) Gait velocity: decreased General Gait Details: patient continues to demonstrate noted instability during ambulation, some improvements noted in coordination of RLE but remains unsafe and significantly high fall risk. Moderate assist to prevent LOB without device, and min assist with maximal cues for safety and awareness using RW.  ADL: ADL Grooming Details (indicate cue type and reason): Pt's RLE unstable during sink level grooming, Pt provided with vc for safety. Pt needed assist in opening containers for grooming items Toilet Transfer Details (indicate cue type and reason): Pt confused when we walked into bathroom, "where is the sink?" had to be reminded of task vc for safety so she would sit on the toilet Toileting - Clothing Manipulation Details (indicate cue type and reason): use of rail for balance  Cognition: Cognition Overall Cognitive Status: History of cognitive impairments - at baseline Orientation Level: Oriented X4 Cognition Arousal/Alertness: Awake/alert Behavior During Therapy: Anxious, Restless Overall Cognitive Status: History of cognitive impairments - at baseline Area of Impairment:  Safety/judgement, Following commands, Attention, Problem solving Current Attention Level: Alternating Following Commands: Follows one step commands consistently Safety/Judgement: Decreased awareness of safety, Decreased awareness of deficits Problem Solving: Slow processing, Difficulty sequencing, Requires verbal cues General Comments: patient continues to  demonstrate deficts in safety awareness  Blood pressure (!) 164/51, pulse 88, temperature 97.9 F (36.6 C), temperature source Oral, resp. rate 20, height 5' 3.75" (1.619 m), weight 71.4 kg (157 lb 6.5 oz), SpO2 93 %. Physical Exam  General: No acute distress Mood and affect are appropriate Heart: Regular rate and rhythm no rubs murmurs or extra sounds Lungs: Clear to auscultation, breathing unlabored, no rales or wheezes Abdomen: Positive bowel sounds, soft nontender to palpation, nondistended Extremities: No clubbing, cyanosis, or  edema Skin: No evidence of breakdown, no evidence of rash Neurologic: Cranial nerves II through XII intact, motor strength is 5/5 in bilateral deltoid, bicep, tricep, grip, hip flexor, knee extensors, ankle dorsiflexor and plantar flexor Sensory exam normal sensation to light touch and proprioception in bilateral upper and lower extremities Reduced sensation right side of face Cerebellar exam mild dysmetria right upper extremity Musculoskeletal: Full range of motion in all 4 extremities. No joint swelling Lab Results Last 24 Hours       Results for orders placed or performed during the hospital encounter of 01/07/17 (from the past 24 hour(s))  Basic metabolic panel     Status: Abnormal   Collection Time: 01/08/17  8:31 PM  Result Value Ref Range   Sodium 139 135 - 145 mmol/L   Potassium 3.8 3.5 - 5.1 mmol/L   Chloride 102 101 - 111 mmol/L   CO2 27 22 - 32 mmol/L   Glucose, Bld 140 (H) 65 - 99 mg/dL   BUN 14 6 - 20 mg/dL   Creatinine, Ser 1.18 (H) 0.44 - 1.00 mg/dL   Calcium 8.8 (L) 8.9 -  10.3 mg/dL   GFR calc non Af Amer 41 (L) >60 mL/min   GFR calc Af Amer 48 (L) >60 mL/min   Anion gap 10 5 - 15  Magnesium     Status: None   Collection Time: 01/08/17  8:31 PM  Result Value Ref Range   Magnesium 2.2 1.7 - 2.4 mg/dL  CBC     Status: None   Collection Time: 01/09/17  4:31 AM  Result Value Ref Range   WBC 7.2 4.0 - 10.5 K/uL   RBC 4.89 3.87 - 5.11 MIL/uL   Hemoglobin 14.3 12.0 - 15.0 g/dL   HCT 44.3 36.0 - 46.0 %   MCV 90.6 78.0 - 100.0 fL   MCH 29.2 26.0 - 34.0 pg   MCHC 32.3 30.0 - 36.0 g/dL   RDW 12.7 11.5 - 15.5 %   Platelets 174 150 - 400 K/uL  Basic metabolic panel     Status: Abnormal   Collection Time: 01/09/17  4:31 AM  Result Value Ref Range   Sodium 140 135 - 145 mmol/L   Potassium 3.8 3.5 - 5.1 mmol/L   Chloride 102 101 - 111 mmol/L   CO2 27 22 - 32 mmol/L   Glucose, Bld 120 (H) 65 - 99 mg/dL   BUN 15 6 - 20 mg/dL   Creatinine, Ser 1.01 (H) 0.44 - 1.00 mg/dL   Calcium 8.9 8.9 - 10.3 mg/dL   GFR calc non Af Amer 50 (L) >60 mL/min   GFR calc Af Amer 58 (L) >60 mL/min   Anion gap 11 5 - 15      Imaging Results (Last 48 hours)  Mr Brain Wo Contrast  Result Date: 01/08/2017 CLINICAL DATA:  Transient ischemic attack. Acute onset right-sided weakness and slurred speech EXAM: MRI HEAD WITHOUT CONTRAST TECHNIQUE: Multiplanar, multiecho pulse sequences of the brain and surrounding structures were obtained without intravenous contrast. COMPARISON:  Head CT 01/07/2017 Brain MRI 12/16/2016 FINDINGS: Brain: The midline structures are normal. There is a small focus of diffusion restriction within the lateral left thalamus, measuring 9 x 5 mm. This is in close proximity to the posterior limb of the left internal capsule. There is no acute hemorrhage. There is beginning confluent hyperintense T2-weighted signal within the periventricular, subcortical and deep white matter, most commonly seen in the setting of chronic microvascular  ischemia. Chronic  microhemorrhage versus mineralization in the left basal ganglia. Brain volume is normal for age without age-advanced or lobar predominant atrophy. The dura is normal and there is no extra-axial collection. Vascular: Major intracranial arterial and venous sinus flow voids are preserved. Skull and upper cervical spine: The visualized skull base, calvarium, upper cervical spine and extracranial soft tissues are normal. Sinuses/Orbits: No fluid levels or advanced mucosal thickening. No mastoid effusion. Normal orbits. IMPRESSION: 1. Acute ischemia within the left thalamus, in close proximity to the posterior limb of the left internal capsule. This is in keeping with the reported right-sided weakness. 2. No hemorrhage or mass effect. 3. Chronic microvascular ischemia. Electronically Signed   By: Ulyses Jarred M.D.   On: 01/08/2017 06:33   Ct Head Code Stroke W/o Cm  Result Date: 01/07/2017 CLINICAL DATA:  Code stroke. RIGHT facial and arm numbness. Slurred speech. History of breast cancer, hypertension, hyperlipidemia and polycythemia. EXAM: CT HEAD WITHOUT CONTRAST TECHNIQUE: Contiguous axial images were obtained from the base of the skull through the vertex without intravenous contrast. COMPARISON:  MRI of the head December 16, 2016 FINDINGS: BRAIN: No intraparenchymal hemorrhage, mass effect nor midline shift. The ventricles and sulci are normal for age. Patchy supratentorial white matter hypodensities within normal range for patient's age, though non-specific are most compatible with chronic small vessel ischemic disease. 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 No acute large vascular territory infarcts. No abnormal extra-axial fluid collections. Basal cisterns are patent. VASCULAR: Mild calcific atherosclerosis of the carotid siphons. SKULL: No skull fracture. Osteopenia. No significant scalp soft tissue swelling. SINUSES/ORBITS: The mastoid air-cells and included paranasal sinuses are well-aerated.The  included ocular globes and orbital contents are non-suspicious. Status post bilateral ocular lens implants. OTHER: None. ASPECTS [facility] Stroke Program Early CT Score) - Ganglionic level infarction (caudate, lentiform nuclei, internal capsule, insula, M1-M3 cortex): 7 - Supraganglionic infarction (M4-M6 cortex): 3 Total score (0-10 with 10 being normal): 10 IMPRESSION: 1. No acute intracranial process. 2. Stable examination including moderate chronic small vessel ischemic disease and old LEFT basal ganglia infarct. 3. ASPECTS is 10. Critical Value/emergent results were called by telephone at the time of interpretation on 01/07/2017 at 9:57 pm to Dr. Nicole Kindred, Neurology , who verbally acknowledged these results. Electronically Signed   By: Elon Alas M.D.   On: 01/07/2017 21:58     Assessment/Plan: Diagnosis: Left thalamic infarct with gait disorder 1. Does the need for close, 24 hr/day medical supervision in concert with the patient's rehab needs make it unreasonable for this patient to be served in a less intensive setting? Potentially 2. Co-Morbidities requiring supervision/potential complications: Hypertension, hyperlipidemia, spinal stenosis 3. Due to bladder management, bowel management, safety, skin/wound care, disease management, medication administration and pain management, does the patient require 24 hr/day rehab nursing? Yes 4. Does the patient require coordinated care of a physician, rehab nurse, PT (1-2 hrs/day, 5 days/week) and OT (1-2 hrs/day, 5 days/week) to address physical and functional deficits in the context of the above medical diagnosis(es)? Yes Addressing deficits in the following areas: balance, endurance, locomotion, strength, transferring, bowel/bladder control, bathing, dressing, feeding, grooming and toileting 5. Can the patient actively participate in an intensive therapy program of at least 3 hrs of therapy per day at least 5 days per week? Yes 6. The potential for  patient to make measurable gains while on inpatient rehab is fair 7. Anticipated functional outcomes upon discharge from inpatients are Mod I PT, Mod I OT, NASLP 8. Estimated rehab length of stay to reach  the above functional goals is: 7d 9. Does the patient have adequate social supports to accommodate these discharge functional goals? Yes 10. Anticipated D/C setting: Home 11. Anticipated post D/C treatments: [place] therapy 12. Overall Rehab/Functional Prognosis: excellent  RECOMMENDATIONS: This patient's condition is appropriate for continued rehabilitative care in the following setting: CIR Patient has agreed to participate in recommended program. Yes Note that insurance prior authorization may be required for reimbursement for recommended care.  Comment: If patient gets back to modified Indep level in 1-2 days, then would recommend home with home health   [name] 01/09/2017     Revision History                        Routing History

## 2017-01-11 NOTE — Evaluation (Signed)
Speech Language Pathology Assessment and Plan  Patient Details  Name: [name] MRN: [id number] Date of Birth: [birth year]  SLP Diagnosis: Dysarthria;Cognitive Impairments  Rehab Potential:   ELOS: 10 to 12 days    Today's Date: 01/11/2017 SLP Individual Time: 0700-0800     Problem List:  Patient Active Problem List   Diagnosis Date Noted  . Acute arterial ischemic stroke, vertebrobasilar, thalamic, left ([place]) 01/10/2017  . Neurologic gait disorder   . Depression   . Benign essential HTN   . History of breast cancer   . Stroke (cerebrum) ([place]) 01/08/2017  . TIA (transient ischemic attack) 12/16/2016  . Dysarthria 12/16/2016  . Leg weakness 12/16/2016  . EKG abnormalities 12/16/2016  . Chronic pain 12/16/2016  . Elevated blood pressure 12/22/2014  . Internal hemorrhoids with bleeding and prolapse 11/04/2014  . Lactose intolerance 11/04/2014  . Hemorrhoid 09/11/2014  . Rectal bleeding 09/11/2014  . IBS (irritable bowel syndrome) 09/25/2013  . Hyperparathyroidism, primary ([place]) 05/26/2011  . Osteoporosis 05/09/2009  . POLYCYTHEMIA 04/23/2009  . IMPAIRED FASTING GLUCOSE 11/14/2008  . BREAST CANCER, HX OF 12/06/2007  . Anxiety state 10/13/2007  . Essential hypertension 10/13/2007  . INTERSTITIAL CYSTITIS 09/06/2007  . HLD (hyperlipidemia) 03/01/2007  . Backache 03/01/2007   Past Medical History:  Past Medical History:  Diagnosis Date  . Adenomatous colon polyp 04/2005  . Allergic    Anectine  . ANXIETY 10/13/2007  . BACK PAIN 03/01/2007  . BREAST CANCER, HX OF 12/06/2007  . Cancer [facility])    RIGHT mastectomy with node dissection  . Complication of anesthesia    family aggergy to annectine  . Compression fracture of L4 lumbar vertebra ([facility]) 02/2013  . CONSTIPATION 01/06/2009  . DEPRESSION 10/13/2007  . Diverticular disease   . DIVERTICULOSIS, COLON 05/11/2005  . External hemorrhoids without mention of complication 0/16/0109  . FATIGUE 08/07/2008  .  HEMORRHOIDS, INTERNAL 05/11/2005  . Hypercalcemia 04/23/2009  . HYPERLIPIDEMIA NEC/NOS 03/01/2007  . HYPERTENSION 10/13/2007  . Impaired fasting glucose 11/14/2008  . INTERSTITIAL CYSTITIS 09/06/2007  . Irritable bowel syndrome 01/06/2009  . LIVER MASS 05/15/2009  . OSTEOPOROSIS 05/09/2009  . PARESTHESIA 09/06/2007  . POLYCYTHEMIA 04/23/2009  . STENOSIS, SPINAL, UNSPC REGION 03/01/2007  . Thyroid disease    Past Surgical History:  Past Surgical History:  Procedure Laterality Date  . ABDOMINAL HYSTERECTOMY    . BACK SURGERY    . BREAST SURGERY     mastectomy -right  . CERVICAL SPINE SURGERY    . CHOLECYSTECTOMY    . COLONOSCOPY W/ BIOPSIES    . cortisone injection  10/16/12   left knee  . FIXATION KYPHOPLASTY LUMBAR SPINE    . HEMORRHOID BANDING  2015-16  . PARATHYROID EXPLORATION  06/29/2011   Procedure: PARATHYROID EXPLORATION;  Surgeon: Earnstine Regal, MD;  Location: [facility];  Service: General;  Laterality: Left;  Left Superior Parathyroidectomy  . PARATHYROIDECTOMY  06/29/11    Assessment / Plan / Recommendation Clinical Impression 83 year old right handed female with history of polycythemia, right breast cancer with mastectomy, hypertension, hyperlipidemia, spinal stenosis, TIA 12/16/2016 maintained on aspirin. Per chart review patient lives alone independently with assistive device prior to admission. She does have a niece that stays with her at times to assist with ADLs. 2 level home bath and bedroom on main level III steps to entry. Acute onset of right-sided weakness and slurred speech and admitted 01/07/2017. CT reviewed, showing acute left thalamus CVA. Per report, MRI showed acute ischemia within the left thalamus,  in close proximity to the posterior limb of left internal capsule. No hemorrhage or mass effect. Echocardiogram showed diastolic dysfunction, but otherwise normal, MRA showed moderate to severe stenosis, left MCA and M2 branches. Patient did not receive TPA. Was started on dual  antiplatelet therapy with aspirin and Plavix for 3 months and then either aspirin or Plavix alone thereafter. Tolerating a regular consistency diet. Physical and occupational therapy evaluation completed with recommendations of physical medicine rehabilitation consult. Patient was admitted for comprehensive rehabilitation program on 01/10/17. Comprehensive speech-language evaluation completed on 01/11/17.   Pt with complex social-emotional history comprising of mild to moderate STM deficits and year of significant emotional traumatic stress that impacted her ability to live independently and placed her in social isolation. As a result pt has hired Environmental consultant in the home 4 days a week along with family assistance for all ADLs. Pt's functional ability manifests itself in self-guided daily activities, she doens't perform financial, medicine, grocery store tasks, cooking or yard [place]. At evaluation, pt demonstrates very verbose repetitive comments concerning emotional stress. Her repetitive comments reflect STM deficits, suspect this is baseline. Most concerning are her decreased awareness of current physical deficits related to acute CVA such as right lean etc. Pt unaware that verbose comments derail pt's ability to concentrate on topic or task at hand. Although pt's speech is >90% intelligible at the simple conversation level, she would benefit from implementing compensatory speech intelligibility strategies. Skilled ST recommended to increase safety at discharge by increasing anticipatory awareness and speech intelligibility. Due to baseline deficits that are now excerabated by current hospitalization, anticipate that pt will require 24 hour supervision at discharge and no follow up ST services.    Skilled Therapeutic Interventions          Skilled treatment session focused on completion speech-language evaluation, see above. Pt required Max A for intellectual awareness.    SLP Assessment      Cognitive  Linguistic Impairments    Recommendations   None   SLP Frequency 3 to 5 out of 7 days   SLP Duration  SLP Intensity  SLP Treatment/Interventions 10 to 12 days  Minumum of 1-2 x/day, 30 to 90 minutes  Cognitive remediation/compensation;Environmental controls;Internal/external aids;Cueing hierarchy;Functional tasks;Patient/family education;Therapeutic Activities    Pain    Prior Functioning    Function:  Eating Eating   Modified Consistency Diet: No Eating Assist Level: No help, No cues           Cognition Comprehension Comprehension assist level: Follows basic conversation/direction with extra time/assistive device  Expression   Expression assist level: Expresses basic needs/ideas: With no assist  Social Interaction Social Interaction assist level: Interacts appropriately with others with medication or extra time (anti-anxiety, antidepressant).  Problem Solving Problem solving assist level: Solves basic 75 - 89% of the time/requires cueing 10 - 24% of the time  Memory Memory assist level: Recognizes or recalls 90% of the time/requires cueing < 10% of the time   Short Term Goals: Week 1: SLP Short Term Goal 1 (Week 1): Pt will identify 2 phsycial deficits related to current CVA with Min A cues.  SLP Short Term Goal 2 (Week 1): Given Min A cues, pt will identify 2 activities that are safe for her to perform at home. SLP Short Term Goal 3 (Week 1): Pt will utilize speech intelligibility strategies at the semi-complex conversation level to achieve > 95% intelligibility.   Refer to Care Plan for Long Term Goals  Recommendations for other services: Neuropsych  Discharge Criteria: Patient will be discharged from SLP if patient refuses treatment 3 consecutive times without medical reason, if treatment goals not met, if there is a change in medical status, if patient makes no progress towards goals or if patient is discharged from hospital.  The above assessment, treatment  plan, treatment alternatives and goals were discussed and mutually agreed upon: by patient   Tyrese Capriotti B. Rutherford Nail, M.S., CCC-SLP Speech-Language Pathologist   Ihor Meinzer 01/11/2017, 2:59 PM

## 2017-01-11 NOTE — Progress Notes (Signed)
Subjective/Complaints: No issues overnite  ROS neg for CP/SOB, N/V/D  Objective: Vital Signs: Blood pressure (!) 122/54, pulse 69, temperature 97.7 F (36.5 C), temperature source Oral, resp. rate 18, height 5' 3"  (1.6 m), weight 80.6 kg (177 lb 9.6 oz), SpO2 95 %. No results found. Results for orders placed or performed during the hospital encounter of 01/10/17 (from the past 72 hour(s))  CBC WITH DIFFERENTIAL     Status: None   Collection Time: 01/11/17  4:39 AM  Result Value Ref Range   WBC 7.1 4.0 - 10.5 K/uL   RBC 4.70 3.87 - 5.11 MIL/uL   Hemoglobin 13.9 12.0 - 15.0 g/dL   HCT 42.5 36.0 - 46.0 %   MCV 90.4 78.0 - 100.0 fL   MCH 29.6 26.0 - 34.0 pg   MCHC 32.7 30.0 - 36.0 g/dL   RDW 12.5 11.5 - 15.5 %   Platelets 165 150 - 400 K/uL   Neutrophils Relative % 42 %   Neutro Abs 2.9 1.7 - 7.7 K/uL   Lymphocytes Relative 48 %   Lymphs Abs 3.4 0.7 - 4.0 K/uL   Monocytes Relative 7 %   Monocytes Absolute 0.5 0.1 - 1.0 K/uL   Eosinophils Relative 2 %   Eosinophils Absolute 0.2 0.0 - 0.7 K/uL   Basophils Relative 1 %   Basophils Absolute 0.0 0.0 - 0.1 K/uL  Comprehensive metabolic panel     Status: Abnormal   Collection Time: 01/11/17  4:39 AM  Result Value Ref Range   Sodium 138 135 - 145 mmol/L   Potassium 3.7 3.5 - 5.1 mmol/L   Chloride 102 101 - 111 mmol/L   CO2 27 22 - 32 mmol/L   Glucose, Bld 115 (H) 65 - 99 mg/dL   BUN 19 6 - 20 mg/dL   Creatinine, Ser 0.93 0.44 - 1.00 mg/dL   Calcium 8.9 8.9 - 10.3 mg/dL   Total Protein 5.9 (L) 6.5 - 8.1 g/dL   Albumin 3.7 3.5 - 5.0 g/dL   AST 17 15 - 41 U/L   ALT 17 14 - 54 U/L   Alkaline Phosphatase 56 38 - 126 U/L   Total Bilirubin 0.4 0.3 - 1.2 mg/dL   GFR calc non Af Amer 55 (L) >60 mL/min   GFR calc Af Amer >60 >60 mL/min    Comment: (NOTE) The eGFR has been calculated using the CKD EPI equation. This calculation has not been validated in all clinical situations. eGFR's persistently <60 mL/min signify possible Chronic  Kidney Disease.    Anion gap 9 5 - 15     HEENT: normal Cardio: RRR and no murmur Resp: CTA B/L and unlabored GI: BS positive and NT, ND Extremity:  Pulses positive and No Edema Skin:   Intact Neuro: Alert/Oriented, Abnormal Sensory mildly diminished R hemifacial and RUE, Normal Motor, Abnormal FMC Ataxic/ dec FMC and Other word finding  finding deficits Musc/Skel:  No pain with ROM    Assessment/Plan: 1. Functional deficits secondary to Left thalamic infarct which require 3+ hours per day of interdisciplinary therapy in a comprehensive inpatient rehab setting. Physiatrist is providing close team supervision and 24 hour management of active medical problems listed below. Physiatrist and rehab team continue to assess barriers to discharge/monitor patient progress toward functional and medical goals. FIM:       Function - Toileting Toileting steps completed by patient: Adjust clothing after toileting, Performs perineal hygiene, Adjust clothing prior to toileting Toileting Assistive Devices: Grab bar or rail  Assist level: Touching or steadying assistance (Pt.75%)           Function - Comprehension Comprehension: Auditory Comprehension assist level: Follows basic conversation/direction with extra time/assistive device  Function - Expression Expression: Verbal Expression assist level: Expresses basic 75 - 89% of the time/requires cueing 10 - 24% of the time. Needs helper to occlude trach/needs to repeat words.  Function - Social Interaction Social Interaction assist level: Interacts appropriately with others with medication or extra time (anti-anxiety, antidepressant).  Function - Problem Solving Problem solving assist level: Solves basic 75 - 89% of the time/requires cueing 10 - 24% of the time  Function - Memory Memory assist level: More than reasonable amount of time Patient normally able to recall (first 3 days only): Current season, Location of own room, Staff  names and faces, That he or she is in a hospital  Medical Problem List and Plan: 1.  Gait disorder secondary to Left thalamic infarct as well as history of TIA CIR evals today 2.  DVT Prophylaxis/Anticoagulation: Subcutaneous Lovenox. Monitor platelet counts of any signs of bleeding 3. Pain Management/back pain: Celebrex 200 mg daily as needed, Soma 350 mg daily as needed as prior to admission, Oxycodone immediate release as needed 4. Mood: Wellbutrin XL 300 mg daily, Valium 10 mg daily as needed anxiety as prior to admission 5. Neuropsych: This patient is capable of making decisions on her own behalf. 6. Skin/Wound Care: Routine skin checks 7. Fluids/Electrolytes/Nutrition: Routine I&O with follow-up chemistries 8. Hypertension. Lopressor 12.5 mg twice a day. Monitor with increased mobility Vitals:   01/10/17 2049 01/11/17 0544  BP: (!) 142/68 (!) 122/54  Pulse: 82 69  Resp:  18  Temp:  97.7 F (36.5 C)    9. Hyperlipidemia. Pravachol 10. History of right breast cancer with mastectomy  LOS (Days) 1 A FACE TO FACE EVALUATION WAS PERFORMED  Sheikh Leverich E 01/11/2017, 7:17 AM

## 2017-01-11 NOTE — Evaluation (Signed)
Occupational Therapy Assessment and Plan  Patient Details  Name: [name] MRN: [id number] Date of Birth: 07-19-1930  OT Diagnosis: cognitive deficits and hemiplegia affecting dominant side Rehab Potential: Rehab Potential (ACUTE ONLY): Good ELOS: 10-12 days   Today's Date: 01/11/2017 OT Individual Time: 6294-7654 OT Individual Time Calculation (min): 60 min     Problem List:  Patient Active Problem List   Diagnosis Date Noted  . Acute arterial ischemic stroke, vertebrobasilar, thalamic, left ([place]) 01/10/2017  . Neurologic gait disorder   . Depression   . Benign essential HTN   . History of breast cancer   . Stroke (cerebrum) ([place]) 01/08/2017  . TIA (transient ischemic attack) 12/16/2016  . Dysarthria 12/16/2016  . Leg weakness 12/16/2016  . EKG abnormalities 12/16/2016  . Chronic pain 12/16/2016  . Elevated blood pressure 12/22/2014  . Internal hemorrhoids with bleeding and prolapse 11/04/2014  . Lactose intolerance 11/04/2014  . Hemorrhoid 09/11/2014  . Rectal bleeding 09/11/2014  . IBS (irritable bowel syndrome) 09/25/2013  . Hyperparathyroidism, primary ([place]) 05/26/2011  . Osteoporosis 05/09/2009  . POLYCYTHEMIA 04/23/2009  . IMPAIRED FASTING GLUCOSE 11/14/2008  . BREAST CANCER, HX OF 12/06/2007  . Anxiety state 10/13/2007  . Essential hypertension 10/13/2007  . INTERSTITIAL CYSTITIS 09/06/2007  . HLD (hyperlipidemia) 03/01/2007  . Backache 03/01/2007    Past Medical History:  Past Medical History:  Diagnosis Date  . Adenomatous colon polyp 04/2005  . Allergic    Anectine  . ANXIETY 10/13/2007  . BACK PAIN 03/01/2007  . BREAST CANCER, HX OF 12/06/2007  . Cancer [facility])    RIGHT mastectomy with node dissection  . Complication of anesthesia    family aggergy to annectine  . Compression fracture of L4 lumbar vertebra ([facility]) 02/2013  . CONSTIPATION 01/06/2009  . DEPRESSION 10/13/2007  . Diverticular disease   . DIVERTICULOSIS, COLON 05/11/2005  . External  hemorrhoids without mention of complication 6/50/3546  . FATIGUE 08/07/2008  . HEMORRHOIDS, INTERNAL 05/11/2005  . Hypercalcemia 04/23/2009  . HYPERLIPIDEMIA NEC/NOS 03/01/2007  . HYPERTENSION 10/13/2007  . Impaired fasting glucose 11/14/2008  . INTERSTITIAL CYSTITIS 09/06/2007  . Irritable bowel syndrome 01/06/2009  . LIVER MASS 05/15/2009  . OSTEOPOROSIS 05/09/2009  . PARESTHESIA 09/06/2007  . POLYCYTHEMIA 04/23/2009  . STENOSIS, SPINAL, UNSPC REGION 03/01/2007  . Thyroid disease    Past Surgical History:  Past Surgical History:  Procedure Laterality Date  . ABDOMINAL HYSTERECTOMY    . BACK SURGERY    . BREAST SURGERY     mastectomy -right  . CERVICAL SPINE SURGERY    . CHOLECYSTECTOMY    . COLONOSCOPY W/ BIOPSIES    . cortisone injection  10/16/12   left knee  . FIXATION KYPHOPLASTY LUMBAR SPINE    . HEMORRHOID BANDING  2015-16  . PARATHYROID EXPLORATION  06/29/2011   Procedure: PARATHYROID EXPLORATION;  Surgeon: Earnstine Regal, MD;  Location: [facility];  Service: General;  Laterality: Left;  Left Superior Parathyroidectomy  . PARATHYROIDECTOMY  06/29/11    Assessment & Plan Clinical Impression:83 year old right handed female with history of polycythemia, right breast cancer with mastectomy, hypertension, hyperlipidemia, spinal stenosis, TIA 12/16/2016 maintained on aspirin. Per chart review patient lives alone independently with assistive device prior to admission. She does have a niece that stays with her at times to assist with ADLs. 2 level home bath and bedroom on main level III steps to entry. Acute onset of right-sided weakness and slurred speech and admitted 01/07/2017. CT reviewed, showing acute left thalamus CVA. Per repot,  MRI showed acute ischemia within the left thalamus, in close proximity to the posterior limb of left internal capsule. No hemorrhage or mass effect. Echocardiogram showed diastolic dysfunction, but otherwise normal, MRA showed moderate to severe stenosis, left MCA and M2  branches. Patient did not receive TPA. Was started on dual antiplatelet therapy with aspirin and Plavix for 3 months and then either aspirin or Plavix alone thereafter. LDL was 109, started on Lovenox for DVT prophylaxis. Tolerating a regular consistency diet. Physical and occupational therapy evaluation completed with recommendations of physical medicine rehabilitation consult. Patient was admitted for comprehensive rehabilitation program. Patient transferred to CIR on 01/10/2017 .    Patient currently requires min with basic self-care skills secondary to muscle weakness, decreased coordination, decreased awareness, decreased problem solving, decreased safety awareness and decreased memory and decreased standing balance, decreased postural control and decreased balance strategies.  Prior to hospitalization, patient could complete basic ADLS with independent .  Patient will benefit from skilled intervention to increase independence with basic self-care skills prior to discharge home with care partner.  Anticipate patient will require 24 hour supervision and follow up home health.  OT - End of Session Endurance Deficit: No OT Assessment Rehab Potential (ACUTE ONLY): Good Barriers to Discharge: Decreased caregiver support OT Patient demonstrates impairments in the following area(s): Balance;Cognition;Motor;Safety;Sensory OT Basic ADL's Functional Problem(s): Bathing;Dressing;Toileting;Grooming OT Transfers Functional Problem(s): Toilet;Tub/Shower OT Additional Impairment(s): None OT Plan OT Intensity: Minimum of 1-2 x/day, 45 to 90 minutes OT Frequency: 5 out of 7 days OT Duration/Estimated Length of Stay: 10-12 days OT Treatment/Interventions: Balance/vestibular training;Cognitive remediation/compensation;Discharge planning;DME/adaptive equipment instruction;Functional mobility training;Neuromuscular re-education;Patient/family education;Self Care/advanced ADL retraining;Therapeutic  Activities;Therapeutic Exercise;UE/LE Strength taining/ROM;UE/LE Coordination activities OT Self Feeding Anticipated Outcome(s): I OT Basic Self-Care Anticipated Outcome(s): supervision OT Toileting Anticipated Outcome(s): supervision OT Bathroom Transfers Anticipated Outcome(s): supervision OT Recommendation Recommendations for Other Services: Neuropsych consult;Therapeutic Recreation consult Therapeutic Recreation Interventions: Stress management Patient destination: Home Follow Up Recommendations: Home health OT Equipment Recommended: To be determined   Skilled Therapeutic Intervention Pt seen for initial evaluation and ADL retraining to include toileting, shower, dressing.  Discussed purpose of OT, goals, POC.  Pt states that her only deficits from this CVA are impaired R side sensation, coordination, and balance. She stated that she was happy that it did not affect her mind. During the evaluation, pt was frequently distracted, demonstrated impulsive movements, perseverated on thought patterns related to her anxiety.  She continually tried to stand and step out of her clothing despite cues to sit down and manage clothing over feet. In the shower, pt repeatedly washed the same area until cued that she can move onto other body areas.  Pt is quite certain that she can go home soon and be fine to be independent, although she does know that she cannot drive.  Pt will need OT intervention to improve balance, safety, awareness/ insight to increase her independence with self care.  Pt in room sitting on bed with bed alarm on.  OT Evaluation Precautions/Restrictions  Precautions Precautions: Fall Precaution Comments: impulsive, poor L awareness Restrictions Weight Bearing Restrictions: No    Vital Signs Therapy Vitals Pulse Rate: 72 BP: 125/60 Pain Pain Assessment Pain Assessment: No/denies pain  Home Living/Prior Functioning Home Living Living Arrangements: Alone Available Help at  Discharge: Neighbor, Available PRN/intermittently, Family, Other (Comment), Personal care attendant Type of Home: House Home Access: Stairs to enter [employer] of Steps: 2 Entrance Stairs-Rails: Right, Left Home Layout: Able to live on main level with bedroom/bathroom  Alternate Level Stairs-Number of Steps: pt has a freezer downstairs Bathroom Shower/Tub: Walk-in shower, Tub/shower unit, Other (comment) Bathroom Toilet: Standard  Lives With: Alone, Other (Comment) (has family friends that she can call for help) IADL History Education: college Prior Function Level of Independence: Requires assistive device for independence (has RW and rollator, and small based quad cane)  Able to Take Stairs?: Yes Driving: Yes Vocation: Retired Comments: Pt reports that she has a hired caregiver that comes daily to [employer] and clean. ADL ADL ADL Comments: refer to functional navigator Vision Baseline Vision/History: Wears glasses Wears Glasses: At all times Patient Visual Report: No change from baseline Vision Assessment?: No apparent visual deficits Perception  Perception: Within Functional Limits Comments: R elbow Praxis Praxis: Intact Cognition Overall Cognitive Status: History of cognitive impairments - at baseline Arousal/Alertness: Awake/alert Orientation Level: Person;Place;Situation Person: Oriented Place: Oriented Situation: Oriented Year: 2018 Month: May Day of Week: Incorrect (1 day off, Monday) Memory: Impaired Memory Impairment: Retrieval deficit;Decreased recall of new information;Decreased short term memory Decreased Short Term Memory: Verbal basic;Functional basic Immediate Memory Recall: Sock;Blue;Bed Memory Recall: Sock;Blue;Bed Memory Recall Sock: With Cue Memory Recall Blue: Without Cue Memory Recall Bed: Without Cue Attention: Sustained Sustained Attention: Appears intact Selective Attention: Impaired Selective Attention Impairment: Verbal  complex;Functional complex Awareness: Impaired Awareness Impairment: Intellectual impairment Problem Solving: Impaired Problem Solving Impairment: Verbal basic;Functional basic Behaviors: Impulsive;Perseveration Safety/Judgment: Impaired Comments: Pt with no insight/recall of new physical deficits and pt often distracted by verbose comments related to social trauma Sensation Sensation Light Touch: Appears Intact (reports slight difference in sensation to LT but 100% accurate with testing) Stereognosis: Impaired by gross assessment Hot/Cold: Appears Intact Proprioception: Impaired by gross assessment Coordination Gross Motor Movements are Fluid and Coordinated: No Fine Motor Movements are Fluid and Coordinated: Yes Coordination and Movement Description: decreased RLE coordination with stepping and turning Finger Nose Finger Test: slower on R but accurate Motor  Motor Motor: Hemiplegia Mobility  Bed Mobility Bed Mobility: Sit to Supine;Supine to Sit (falt therapy mat) Supine to Sit: 5: Supervision Sit to Supine: 5: Supervision Transfers Sit to Stand: 4: Min guard Stand to Sit: 4: Min guard  Trunk/Postural Assessment  Cervical Assessment Cervical Assessment: Within Functional Limits Thoracic Assessment Thoracic Assessment: Within Functional Limits Lumbar Assessment Lumbar Assessment: Within Functional Limits Postural Control Postural Control: Deficits on evaluation Protective Responses: delayed and insufficient Postural Limitations: decreased  Balance Balance Balance Assessed: Yes Dynamic Sitting Balance Dynamic Sitting - Level of Assistance: 5: Stand by assistance Static Standing Balance Static Standing - Balance Support: No upper extremity supported;Bilateral upper extremity supported Static Standing - Level of Assistance: 5: Stand by assistance Dynamic Standing Balance Dynamic Standing - Balance Support: Bilateral upper extremity supported Dynamic Standing - Level  of Assistance: 4: Min assist Extremity/Trunk Assessment RUE Assessment RUE Assessment: Within Functional Limits (impaired sensation with numbness) LUE Assessment LUE Assessment: Within Functional Limits   See Function Navigator for Current Functional Status.   Refer to Care Plan for Long Term Goals  Recommendations for other services: Neuropsych   Discharge Criteria: Patient will be discharged from OT if patient refuses treatment 3 consecutive times without medical reason, if treatment goals not met, if there is a change in medical status, if patient makes no progress towards goals or if patient is discharged from hospital.  The above assessment, treatment plan, treatment alternatives and goals were discussed and mutually agreed upon: by patient  [facility] 01/11/2017, 12:17 PM

## 2017-01-11 NOTE — Evaluation (Signed)
Physical Therapy Assessment and Plan  Patient Details  Name: [name] MRN: [id number] Date of Birth: [birth year]  PT Diagnosis: Cognitive deficits, Difficulty walking and Hemiparesis dominant Rehab Potential: Fair ELOS: 10-12 days   Today's Date: 01/11/2017 PT Individual Time: 1000-1115 PT Individual Time Calculation (min): 75 min    Problem List:  Patient Active Problem List   Diagnosis Date Noted  . Acute arterial ischemic stroke, vertebrobasilar, thalamic, left ([place]) 01/10/2017  . Neurologic gait disorder   . Depression   . Benign essential HTN   . History of breast cancer   . Stroke (cerebrum) ([place]) 01/08/2017  . TIA (transient ischemic attack) 12/16/2016  . Dysarthria 12/16/2016  . Leg weakness 12/16/2016  . EKG abnormalities 12/16/2016  . Chronic pain 12/16/2016  . Elevated blood pressure 12/22/2014  . Internal hemorrhoids with bleeding and prolapse 11/04/2014  . Lactose intolerance 11/04/2014  . Hemorrhoid 09/11/2014  . Rectal bleeding 09/11/2014  . IBS (irritable bowel syndrome) 09/25/2013  . Hyperparathyroidism, primary ([place]) 05/26/2011  . Osteoporosis 05/09/2009  . POLYCYTHEMIA 04/23/2009  . IMPAIRED FASTING GLUCOSE 11/14/2008  . BREAST CANCER, HX OF 12/06/2007  . Anxiety state 10/13/2007  . Essential hypertension 10/13/2007  . INTERSTITIAL CYSTITIS 09/06/2007  . HLD (hyperlipidemia) 03/01/2007  . Backache 03/01/2007    Past Medical History:  Past Medical History:  Diagnosis Date  . Adenomatous colon polyp 04/2005  . Allergic    Anectine  . ANXIETY 10/13/2007  . BACK PAIN 03/01/2007  . BREAST CANCER, HX OF 12/06/2007  . Cancer [facility])    RIGHT mastectomy with node dissection  . Complication of anesthesia    family aggergy to annectine  . Compression fracture of L4 lumbar vertebra ([facility]) 02/2013  . CONSTIPATION 01/06/2009  . DEPRESSION 10/13/2007  . Diverticular disease   . DIVERTICULOSIS, COLON 05/11/2005  . External hemorrhoids without mention  of complication 7/78/2423  . FATIGUE 08/07/2008  . HEMORRHOIDS, INTERNAL 05/11/2005  . Hypercalcemia 04/23/2009  . HYPERLIPIDEMIA NEC/NOS 03/01/2007  . HYPERTENSION 10/13/2007  . Impaired fasting glucose 11/14/2008  . INTERSTITIAL CYSTITIS 09/06/2007  . Irritable bowel syndrome 01/06/2009  . LIVER MASS 05/15/2009  . OSTEOPOROSIS 05/09/2009  . PARESTHESIA 09/06/2007  . POLYCYTHEMIA 04/23/2009  . STENOSIS, SPINAL, UNSPC REGION 03/01/2007  . Thyroid disease    Past Surgical History:  Past Surgical History:  Procedure Laterality Date  . ABDOMINAL HYSTERECTOMY    . BACK SURGERY    . BREAST SURGERY     mastectomy -right  . CERVICAL SPINE SURGERY    . CHOLECYSTECTOMY    . COLONOSCOPY W/ BIOPSIES    . cortisone injection  10/16/12   left knee  . FIXATION KYPHOPLASTY LUMBAR SPINE    . HEMORRHOID BANDING  2015-16  . PARATHYROID EXPLORATION  06/29/2011   Procedure: PARATHYROID EXPLORATION;  Surgeon: Earnstine Regal, MD;  Location: [facility];  Service: General;  Laterality: Left;  Left Superior Parathyroidectomy  . PARATHYROIDECTOMY  06/29/11    Assessment & Plan Clinical Impression: 83 year oldRight handed female with history of polycythemia, right breast cancer with mastectomy, hypertension, hyperlipidemia, spinal stenosis, TIA 12/16/2016 maintained on aspirin. Per chart review patient lives alone independently with assistive device prior to admission. She does have a niece that stays with her at times to assist with ADLs. 2 level home bath and bedroom on main level III steps to entry. Acute onset of right-sided weakness and slurred speech and admitted 01/07/2017. CT/MRI showed acute ischemia within the left thalamus, in close proximity to the posterior  limb of left internal capsule. No hemorrhage or mass effect. Echocardiogram showed diastolic dysfunction, but otherwise normal, MRA showed moderate to severe stenosis, left MCA and M2 branches. Patient did not receive TPA. Was started on dual antiplatelet therapy  with aspirin and Plavix for 3 months and then either aspirin or Plavix alone thereafter. LDL was 109, started on Lovenox for DVT prophylaxis. Tolerating a regular consistency diet. Physical and occupational therapy evaluation completed with recommendations of physical medicine rehabilitation consult. Patient was admitted for comprehensive rehabilitation program.  Patient transferred to CIR on 01/10/2017 .   Patient currently requires min with mobility secondary to muscle weakness, unbalanced muscle activation and decreased coordination, decreased attention to left, decreased attention, decreased awareness, decreased problem solving, decreased safety awareness and decreased memory and decreased standing balance, decreased postural control, hemiplegia and decreased balance strategies.  Prior to hospitalization, patient was modified independent  with mobility and lived with Alone, Other (Comment) (has family friends that she can call for help) in a House home.  Home access is 2Stairs to enter.  Patient will benefit from skilled PT intervention to maximize safe functional mobility, minimize fall risk and decrease caregiver burden for planned discharge home with 24 hour supervision.  Anticipate patient will benefit from follow up [place] at discharge.  PT - End of Session Activity Tolerance: Tolerates 30+ min activity with multiple rests Endurance Deficit: No PT Assessment Rehab Potential (ACUTE/IP ONLY): Fair Barriers to Discharge: Decreased caregiver support PT Patient demonstrates impairments in the following area(s): Balance;Perception;Safety;Sensory PT Transfers Functional Problem(s): Bed Mobility;Bed to Chair;Car;Furniture;Floor PT Locomotion Functional Problem(s): Stairs;Ambulation PT Plan PT Intensity: Minimum of 1-2 x/day ,45 to 90 minutes PT Frequency: 5 out of 7 days PT Duration Estimated Length of Stay: 10-12 days PT Treatment/Interventions: Ambulation/gait training;Balance/vestibular  training;Cognitive remediation/compensation;Community reintegration;Discharge planning;DME/adaptive equipment instruction;Functional mobility training;Neuromuscular re-education;Patient/family education;Pain management;Psychosocial support;Stair training;Therapeutic Activities;Therapeutic Exercise;Visual/perceptual remediation/compensation;UE/LE Coordination activities;UE/LE Strength taining/ROM PT Transfers Anticipated Outcome(s): supervision PT Locomotion Anticipated Outcome(s): supervision with LRAD PT Recommendation Recommendations for Other Services: Neuropsych consult;Therapeutic Recreation consult Therapeutic Recreation Interventions: Outing/community reintergration;Stress management Follow Up Recommendations: Home health PT;24 hour supervision/assistance Patient destination: Home Equipment Recommended: None recommended by PT  Skilled Therapeutic Intervention No c/o pain.  Session focus on initial PT evaluation, pt education regarding CLOF/LOS/POC, and instruction in basic transfers, gait with LRAD, and awareness of deficits.  Pt requires mod multimodal cues for safety with mobility 2/2 impulsivity and poor awareness of deficits.  Instruction in gait without device, with RW, and with rollator all with min assist and min cues for upright posture, pacing, and attention to obstacles in L visual field.  Pt with questions throughout session regarding whether or not it was normal for her to not want to associate with anyone or "make new friends" at her age, provided emotional support and will refer to neuropsych.  Pt returned to room at end of session and returned to bed with supervision.  Call bell in reach and needs met.   PT Evaluation Precautions/Restrictions Precautions Precautions: Fall Precaution Comments: impulsive, poor L awareness Restrictions Weight Bearing Restrictions: No Pain Pain Assessment Pain Assessment: No/denies pain Pain Score: 7  Pain Type: Chronic pain Pain Location:  Generalized Home Living/Prior Functioning Home Living Living Arrangements: Alone Available Help at Discharge: Neighbor;Available PRN/intermittently;Family;Other (Comment);Personal care attendant Type of Home: House Home Access: Stairs to enter [employer] of Steps: 2 Entrance Stairs-Rails: Right;Left Home Layout: Able to live on main level with bedroom/bathroom Alternate Level Stairs-Number of Steps: pt has a freezer downstairs Bathroom  Shower/Tub: Walk-in shower;Tub/shower unit;Other (comment) Bathroom Toilet: Standard  Lives With: Alone;Other (Comment) (has family friends that she can call for help) Prior Function Level of Independence: Requires assistive device for independence (has RW and rollator, and small based quad cane)  Able to Take Stairs?: Yes Driving: Yes Vocation: Retired Comments: Pt reports that she has a hired caregiver that comes daily to [employer] and clean. Vision/Perception  Perception Perception: Within Functional Limits Comments: R elbow Praxis Praxis: Intact  Cognition Overall Cognitive Status: History of cognitive impairments - at baseline Arousal/Alertness: Awake/alert Orientation Level: Oriented to person;Oriented to place;Oriented to situation (able to state month/year but not date) Attention: Sustained Sustained Attention: Appears intact Selective Attention: Impaired Selective Attention Impairment: Verbal complex;Functional complex Memory: Impaired Memory Impairment: Retrieval deficit;Decreased recall of new information;Decreased short term memory Decreased Short Term Memory: Verbal basic;Functional basic Awareness: Impaired Awareness Impairment: Intellectual impairment Problem Solving: Impaired Problem Solving Impairment: Verbal basic;Functional basic Behaviors: Impulsive;Perseveration Safety/Judgment: Impaired Comments: Pt with no insight/recall of new physical deficits and pt often distracted by verbose comments related to social  trauma Sensation Sensation Light Touch: Appears Intact (reports slight difference in sensation to LT but 100% accurate with testing) Stereognosis: Impaired by gross assessment Hot/Cold: Appears Intact Proprioception: Impaired by gross assessment Coordination Gross Motor Movements are Fluid and Coordinated: No Fine Motor Movements are Fluid and Coordinated: Yes Coordination and Movement Description: decreased RLE coordination with stepping and turning Finger Nose Finger Test: slower on R but accurate Motor  Motor Motor: Hemiplegia  Mobility Bed Mobility Bed Mobility: Sit to Supine;Supine to Sit (falt therapy mat) Supine to Sit: 5: Supervision Sit to Supine: 5: Supervision Transfers Transfers: Yes Sit to Stand: 4: Min guard Stand to Sit: 4: Min guard Stand Pivot Transfers: 4: Min assist Locomotion  Ambulation Ambulation: Yes Ambulation/Gait Assistance: 4: Min Wellsite geologist (Feet): 150 Feet Assistive device: Rolling walker Ambulation/Gait Assistance Details: Verbal cues for sequencing;Verbal cues for technique;Verbal cues for precautions/safety;Verbal cues for safe use of DME/AE;Verbal cues for gait pattern Gait Gait: Yes Gait Pattern: Impaired Gait Pattern: Step-through pattern;Trunk flexed Gait velocity: fast Wheelchair Mobility Wheelchair Mobility: No  Trunk/Postural Assessment  Cervical Assessment Cervical Assessment: Within Functional Limits Thoracic Assessment Thoracic Assessment: Within Functional Limits Lumbar Assessment Lumbar Assessment: Within Functional Limits Postural Control Postural Control: Deficits on evaluation Protective Responses: delayed and insufficient Postural Limitations: decreased  Balance Balance Balance Assessed: Yes Dynamic Sitting Balance Dynamic Sitting - Level of Assistance: 5: Stand by assistance Static Standing Balance Static Standing - Balance Support: No upper extremity supported;Bilateral upper extremity  supported Static Standing - Level of Assistance: 5: Stand by assistance Dynamic Standing Balance Dynamic Standing - Balance Support: Bilateral upper extremity supported Dynamic Standing - Level of Assistance: 4: Min assist Extremity Assessment  RUE Assessment RUE Assessment: Within Functional Limits (impaired sensation with numbness) LUE Assessment LUE Assessment: Within Functional Limits RLE Assessment RLE Assessment: Exceptions to [facility] RLE AROM (degrees) RLE Overall AROM Comments: WFL assessed in sitting RLE Strength Right Hip Flexion: 3+/5 Right Knee Flexion: 4+/5 Right Knee Extension: 4+/5 Right Ankle Dorsiflexion: 4/5 (fatigues quickly) Right Ankle Plantar Flexion: 4/5 (fatigues quickly) LLE Assessment LLE Assessment: Exceptions to WFL LLE AROM (degrees) LLE Overall AROM Comments: WFL assessed in sitting LLE Strength Left Hip Flexion: 3+/5 Left Knee Flexion: 4/5 Left Knee Extension: 4+/5 Left Ankle Dorsiflexion: 4/5 Left Ankle Plantar Flexion: 4/5   See Function Navigator for Current Functional Status.   Refer to Care Plan for Long Term Goals  Recommendations for other  services: Neuropsych and Therapeutic Recreation  Stress management and Outing/community reintegration  Discharge Criteria: Patient will be discharged from PT if patient refuses treatment 3 consecutive times without medical reason, if treatment goals not met, if there is a change in medical status, if patient makes no progress towards goals or if patient is discharged from hospital.  The above assessment, treatment plan, treatment alternatives and goals were discussed and mutually agreed upon: by patient  Urban Gibson E Penven-Crew 01/11/2017, 10:53 AM

## 2017-01-11 NOTE — Progress Notes (Signed)
Gerlean Ren [facility] Admission Coordinator Signed Physical Medicine and Rehabilitation  PMR Pre-admission Date of Service: 01/10/2017 3:27 PM  Related encounter: ED to Hosp-Admission (Discharged) from 01/07/2017 in [place]       [] Hide copied text PMR Admission Coordinator Pre-Admission Assessment  Patient: [name] is an 83 y.o., female MRN: [id number] DOB: 09-12-1929 Height: 5' 3.75" (161.9 cm) Weight: 71.4 kg (157 lb 6.5 oz)                                                                                                                                                  Insurance Information HMO:     PPO:      PCP:      IPA:      80/20:      OTHER:  PRIMARY: Medicare A and B      Policy#:  [id number]      Subscriber:  self CM Name:  n/a      Phone#:      Fax#:  Pre-Cert#:       Employer: retired Benefits:  Phone #:      Name:  Eff. Date: 03/23/97     Deduct:  $1340      Out of Pocket Max:  n/a      Life Max:  n/a CIR:  100%      SNF:  100% first 20 days Outpatient:  80%     Co-Pay: 20% Home Health:  100%      Co-Pay:   DME:  80%     Co-Pay: 20% Providers:  Pt. choice SECONDARY: BCBS/Federal      Policy#:  [id number]      Subscriber:  CM Name:       Phone#:      Fax#:  Pre-Cert#:       Employer:  Benefits:  Phone #:      Name:  Eff. Date:      Deduct:       Out of Pocket Max:       Life Max:  CIR:       SNF:  Outpatient:      Co-Pay:  Home Health:       Co-Pay:  DME:      Co-Pay:   Medicaid Application Date:       Case Manager:  Disability Application Date:       Case Worker:   Emergency Tax adviser Information    Name Relation Home Work Mobile   Allen,Shirley Friend [phone]     [place] Other   [phone]     Current Medical History Patient Admitting Diagnosis: Left thalamic infarct with gait disorder History of Present Illness: 17 year oldRight handedfemale with history  ofpolycythemia, right breast cancer with mastectomy,hypertension, hyperlipidemia, spinal stenosis,TIA 12/16/2016  maintained on aspirin. Per chart review patient lives alone independently with assistive device prior to admission. She does have a niece that stays with her at times to assist with ADLs. 2 level home bath and bedroom on main level III steps to entry. Acuteonset of right-sided weakness and slurred speechand admitted05/18/2018.CT/MRI showed acute ischemia within the left thalamus, in close proximity to the posterior limb of left internal capsule. No hemorrhage or mass effect.Echocardiogram showed diastolic dysfunction, but otherwise normal, MRA showed moderate to severe stenosis, left MCA and M2 branches.Patient did not receive TPA.Was started on dual antiplatelet therapy with aspirin and Plavix for 3 months and then either aspirin or Plavix alone thereafter. LDL was 109, started on Lovenox for DVT prophylaxis.Tolerating a regular consistency diet. Physical and occupational therapy evaluation completed with recommendations of physical medicine rehabilitation consult. Patient was admitted for comprehensive rehabilitation program Total: 0 [facility]  Past Medical History      Past Medical History:  Diagnosis Date  . Adenomatous colon polyp 04/2005  . Allergic    Anectine  . ANXIETY 10/13/2007  . BACK PAIN 03/01/2007  . BREAST CANCER, HX OF 12/06/2007  . Cancer [facility])    RIGHT mastectomy with node dissection  . Complication of anesthesia    family aggergy to annectine  . Compression fracture of L4 lumbar vertebra (HCC) 02/2013  . CONSTIPATION 01/06/2009  . DEPRESSION 10/13/2007  . Diverticular disease   . DIVERTICULOSIS, COLON 05/11/2005  . External hemorrhoids without mention of complication 0/93/8182  . FATIGUE 08/07/2008  . HEMORRHOIDS, INTERNAL 05/11/2005  . Hypercalcemia 04/23/2009  . HYPERLIPIDEMIA NEC/NOS 03/01/2007  . HYPERTENSION 10/13/2007  . Impaired fasting glucose  11/14/2008  . INTERSTITIAL CYSTITIS 09/06/2007  . Irritable bowel syndrome 01/06/2009  . LIVER MASS 05/15/2009  . OSTEOPOROSIS 05/09/2009  . PARESTHESIA 09/06/2007  . POLYCYTHEMIA 04/23/2009  . STENOSIS, SPINAL, UNSPC REGION 03/01/2007  . Thyroid disease     Family History  family history includes Asthma in her sister; Breast cancer in her maternal aunt; Heart disease in her sister; Hypertension in her sister; Ovarian cancer in her paternal aunt.  Prior Rehab/Hospitalizations:  Has the patient had major surgery during 100 days prior to admission? No surgeries; pt. Goes to [facility] for [facility] therapies currently  Current Medications   Current Facility-Administered Medications:  .   stroke: mapping our early stages of recovery book, , Does not apply, Once, Alcario Drought, Jared M, DO .  acetaminophen (TYLENOL) tablet 650 mg, 650 mg, Oral, Q4H PRN, 650 mg at 01/08/17 1814 **OR** acetaminophen (TYLENOL) solution 650 mg, 650 mg, Per Tube, Q4H PRN **OR** acetaminophen (TYLENOL) suppository 650 mg, 650 mg, Rectal, Q4H PRN, Etta Quill, DO .  aspirin EC tablet 81 mg, 81 mg, Oral, Daily, Bonnielee Haff, MD, 81 mg at 01/10/17 0901 .  buPROPion (WELLBUTRIN XL) 24 hr tablet 300 mg, 300 mg, Oral, Daily, Jennette Kettle M, DO, 300 mg at 01/10/17 0901 .  carisoprodol (SOMA) tablet 350 mg, 350 mg, Oral, Daily PRN, Etta Quill, DO, 350 mg at 01/09/17 2222 .  celecoxib (CELEBREX) capsule 200 mg, 200 mg, Oral, Daily PRN, Etta Quill, DO .  clopidogrel (PLAVIX) tablet 75 mg, 75 mg, Oral, Daily, Bonnielee Haff, MD, 75 mg at 01/10/17 0901 .  diazepam (VALIUM) tablet 10 mg, 10 mg, Oral, Daily PRN, Etta Quill, DO .  enoxaparin (LOVENOX) injection 40 mg, 40 mg, Subcutaneous, QHS, Gardner, Jared M, DO, 40 mg at 01/09/17 2215 .  fluticasone (FLONASE) 50 MCG/ACT  nasal spray 2 spray, 2 spray, Each Nare, Daily PRN, Etta Quill, DO .  hydrOXYzine (ATARAX/VISTARIL) tablet 25 mg, 25 mg, Oral,  QHS, Gardner, Jared M, DO, 25 mg at 01/09/17 2215 .  metoprolol tartrate (LOPRESSOR) tablet 12.5 mg, 12.5 mg, Oral, BID, Bonnielee Haff, MD .  multivitamin with minerals tablet 1 tablet, 1 tablet, Oral, Daily PRN, Alcario Drought, Jared M, DO .  oxyCODONE-acetaminophen (PERCOCET/ROXICET) 5-325 MG per tablet 1 tablet, 1 tablet, Oral, Daily PRN **AND** oxyCODONE (Oxy IR/ROXICODONE) immediate release tablet 5 mg, 5 mg, Oral, Daily PRN, Alcario Drought, Jared M, DO .  polyethylene glycol (MIRALAX / GLYCOLAX) packet 17 g, 17 g, Oral, Daily, Bonnielee Haff, MD, 17 g at 01/10/17 0901 .  pravastatin (PRAVACHOL) tablet 40 mg, 40 mg, Oral, q1800, Etta Quill, DO, 40 mg at 01/09/17 1719 .  senna (SENOKOT) tablet 8.6 mg, 1 tablet, Oral, Daily, Bonnielee Haff, MD, 8.6 mg at 01/10/17 0901 .  sodium phosphate (FLEET) 7-19 GM/118ML enema 1 enema, 1 enema, Rectal, Daily PRN, Bonnielee Haff, MD, 1 enema at 01/09/17 1040  Patients Current Diet: Diet Heart Room service appropriate? Yes; Fluid consistency: Thin  Precautions / Restrictions Precautions Precautions: Fall Restrictions Weight Bearing Restrictions: No   Has the patient had 2 or more falls or a fall with injury in the past year?Yes  Prior Activity Level Limited Community (1-2x/wk): Pt. goes out of the home 1-2 times per week with her friend/caregiver Acie Fredrickson for appointments, errands  Home Assistive Devices / [place] Devices/Equipment: None Home Equipment: Shower seat - built in, [place] - 4 wheels  Prior Device Use: Indicate devices/aids used by the patient prior to current illness, exacerbation or injury? Walker  Prior Functional Level Prior Function Level of Independence: Independent, Independent with assistive device(s) Comments: reports that she drives to therapy; uses rollator, has neice that stays with her and helps with IADLs (Pt really wants to be independent)  Self Care: Did the patient need help bathing,  dressing, using the toilet or eating?  Needed some help to get into and out of her sunken tub  Indoor Mobility: Did the patient need assistance with walking from room to room (with or without device)? Independent  Stairs: Did the patient need assistance with internal or external stairs (with or without device)? Independent  Functional Cognition: Did the patient need help planning regular tasks such as shopping or remembering to take medications? Needed some help and occasional reminders to take meds  Current Functional Level Cognition  Arousal/Alertness: Awake/alert Overall Cognitive Status: History of cognitive impairments - at baseline Current Attention Level: Alternating Orientation Level: Oriented to person, Oriented to place, Oriented to time, Oriented to situation (x specific date) Following Commands: Follows one step commands consistently Safety/Judgement: Decreased awareness of safety, Decreased awareness of deficits General Comments: Pt demonstrating decreased understanding of deficits and decreased safety awareness during activities of daily living. She requires increased time for processing.  Attention: Sustained, Selective Sustained Attention: Impaired Selective Attention: Impaired Memory: Impaired Memory Impairment: Retrieval deficit (recalled 4/5 words with category cue, 1/5 from choice of 3) Awareness: Impaired Awareness Impairment: Intellectual impairment (pt admits physically things are harder, denies memory/cognitive changes; uncertain as no family present to provide premorbid findings) Problem Solving: Impaired Problem Solving Impairment: Verbal basic (pt states she can get up on her own- advised against due to fall risk) Behaviors: Impulsive Safety/Judgment: Impaired Comments: pt's with decreased attention - evaluation was interrupted several times which exacerbated attention impairment    Extremity Assessment (  includes Sensation/Coordination)  Upper  Extremity Assessment: RUE deficits/detail, Generalized weakness RUE Deficits / Details: ataxic movement during ADL, poor dept perception and proprioception, RUE lags during forward flexion, decreased coordination - gross and fine motor RUE Sensation: decreased light touch, decreased proprioception RUE Coordination: decreased fine motor, decreased gross motor  Lower Extremity Assessment: Defer to PT evaluation RLE Deficits / Details: strength WFL, poor proprioception and coordination of RLE during activity RLE Sensation: decreased light touch, decreased proprioception RLE Coordination: decreased fine motor, decreased gross motor    ADLs  Overall ADL's : Needs assistance/impaired Eating/Feeding: Set up, Sitting Grooming: Oral care, Minimal assistance, Standing Grooming Details (indicate cue type and reason): R lateral lean throughout with minimal awareness of this.  Upper Body Bathing: Set up, Sitting Lower Body Bathing: Minimal assistance, Sitting/lateral leans, Cueing for safety Upper Body Dressing : Set up, Sitting Lower Body Dressing: Sit to/from stand, Moderate assistance Lower Body Dressing Details (indicate cue type and reason): Mod assist to coordinate and plan movements related to donning underwear and pants.  Toilet Transfer: Minimal assistance, Cueing for safety, Ambulation, BSC Toilet Transfer Details (indicate cue type and reason): Pt requiring verbal cues for safe sitting and technique. Min assist for stability as pt with significant R lateral lean. Toileting- Clothing Manipulation and Hygiene: Minimal assistance, Sit to/from stand Toileting - Clothing Manipulation Details (indicate cue type and reason): use of rail for balance Tub/ Shower Transfer: Minimal assistance Functional mobility during ADLs: Minimal assistance, Cueing for safety General ADL Comments: Pt attempting to hold onto rolling table despite education that this is not safe.    Mobility  Overal bed mobility:  Needs Assistance Bed Mobility: Supine to Sit Supine to sit: Min guard Sit to supine: Min guard General bed mobility comments: Min guard assist for safety.     Transfers  Overall transfer level: Needs assistance Equipment used: 1 person hand held assist Transfers: Sit to/from Stand Sit to Stand: Min assist General transfer comment: Min assist for stability in standing.     Ambulation / Gait / Stairs / Wheelchair Mobility  Ambulation/Gait Ambulation/Gait assistance: Min assist, Mod assist Ambulation Distance (Feet): 110 Feet (110 min assist with RW; and 20 ft with moderate HHA) Assistive device: 1 person hand held assist Gait Pattern/deviations: Step-to pattern, Trunk flexed, Shuffle, Decreased stride length, Decreased dorsiflexion - right, Decreased dorsiflexion - left General Gait Details: patient continues to demonstrate noted instability during ambulation, some improvements noted in coordination of RLE but remains unsafe and significantly high fall risk. Moderate assist to prevent LOB without device, and min assist with maximal cues for safety and awareness using RW. Gait velocity: decreased    Posture / Balance Balance Overall balance assessment: Needs assistance Sitting-balance support: No upper extremity supported, Feet supported Sitting balance-Leahy Scale: Good Standing balance support: Single extremity supported, During functional activity Standing balance-Leahy Scale: Poor Standing balance comment: Relies on external assist or single UE support for stability.     Special needs/care consideration BiPAP/CPAP   no CPM  no Continuous Drip IV   no Dialysis   no        Life Vest   no Oxygen   no Special Bed   no Trach Size  n/a Wound Vac (area)  no      Skin    WDLs per nursing assessment                               Bowel  mgmt:last BM 01/10/17 continent Bladder mgmt: continent Diabetic mgmt  n/a     Previous Home Environment Living Arrangements: Alone   Lives With: Alone Available Help at Discharge: Neighbor, Available PRN/intermittently, Family Type of Home: House Home Layout: Two level, Able to live on main level with bedroom/bathroom Alternate Level Stairs-Number of Steps: neice stays with her 6 hours 4 days a week Home Access: Stairs to enter Entrance Stairs-Rails: Right, Left Entrance Stairs-Number of Steps: 3 Bathroom Shower/Tub: Walk-in shower, Tub/shower unit, Other (comment) (likes to get into whirlpool) Bathroom Toilet: [place]: No  Discharge Living Setting Plans for Discharge Living Setting: Patient's home Type of Home at Discharge: House Discharge Home Layout: Two level, Able to live on main level with bedroom/bathroom Discharge Home Access: Stairs to enter Entrance Stairs-Rails: Right, Left Entrance Stairs-Number of Steps: 2 Discharge Bathroom Shower/Tub: Walk-in shower Discharge Bathroom Toilet: Standard Discharge Bathroom Accessibility: Yes How Accessible: Accessible via walker Does the patient have any problems obtaining your medications?: No  Social/Family/Support Systems Patient Roles: Other (Comment) (has 2 nephews, one local and one in [place]) Anticipated Caregiver: Acie Fredrickson (friend/caregiver 6 hours per day M-F) Anticipated Caregiver's Contact Information:  Acie Fredrickson [phone]; Jackalyn Lombard ([facility] names [facility] at [facility]) [phone] Ability/Limitations of Caregiver: care is available 6 hours per day.  Enid Derry seems knowledgable regarding pt's needs and seems involved Caregiver Availability: Intermittent Discharge Plan Discussed with Primary Caregiver: Yes Is Caregiver In Agreement with Plan?: Yes Does Caregiver/Family have Issues with Lodging/Transportation while Pt is in Rehab?: No   Goals/Additional Needs Patient/Family Goal for Rehab: modified independent PT/OT; supervision SLP Expected length of stay: 7 days Cultural Considerations: "Catholic" Dietary Needs: heart dite,  thin liquids Equipment Needs: TBA Additional Information: Pt. says her nephew Elita Boone is local but did not want me to reach out to him.  She requested I share info with her caregiver Enid Derry who was at the bedside.  She shared that Jackalyn Lombard is her POA and is the wife of her nephew in [place]. With patient's permission,  I left a voice mail for Helene Kelp to call me back.   Pt/Family Agrees to Admission and willing to participate: Yes Program Orientation Provided & Reviewed with Pt/Caregiver Including Roles  & Responsibilities: Yes   Decrease burden of Care through IP rehab admission: na/   Possible need for SNF placement upon discharge: not anticipated   Patient Condition: This patient's condition remains as documented in the consult dated 01/10/17 , in which the Rehabilitation Physician determined and documented that the patient's condition is appropriate for intensive rehabilitative care in an inpatient rehabilitation facility. Will admit to inpatient rehab today.  Preadmission Screen Completed By:  Gerlean Ren, 01/10/2017 3:38 PM ______________________________________________________________________   Discussed status with Dr.  Posey Pronto on 01/10/17 at  1538  and received telephone approval for admission today.  Admission Coordinator:  Gerlean Ren, time 1224 Sudie Grumbling 01/10/17       Cosigned by: Jamse Arn, MD at 01/10/2017 3:44 PM  Revision History

## 2017-01-11 NOTE — Progress Notes (Signed)
Social Work Assessment and Plan Social Work Assessment and Plan  Patient Details  Name: [name] MRN: [id number] Date of Birth: April 13, 1930  Today's Date: 01/11/2017  Problem List:  Patient Active Problem List   Diagnosis Date Noted  . Acute arterial ischemic stroke, vertebrobasilar, thalamic, left ([place]) 01/10/2017  . Neurologic gait disorder   . Depression   . Benign essential HTN   . History of breast cancer   . Stroke (cerebrum) ([place]) 01/08/2017  . TIA (transient ischemic attack) 12/16/2016  . Dysarthria 12/16/2016  . Leg weakness 12/16/2016  . EKG abnormalities 12/16/2016  . Chronic pain 12/16/2016  . Elevated blood pressure 12/22/2014  . Internal hemorrhoids with bleeding and prolapse 11/04/2014  . Lactose intolerance 11/04/2014  . Hemorrhoid 09/11/2014  . Rectal bleeding 09/11/2014  . IBS (irritable bowel syndrome) 09/25/2013  . Hyperparathyroidism, primary ([place]) 05/26/2011  . Osteoporosis 05/09/2009  . POLYCYTHEMIA 04/23/2009  . IMPAIRED FASTING GLUCOSE 11/14/2008  . BREAST CANCER, HX OF 12/06/2007  . Anxiety state 10/13/2007  . Essential hypertension 10/13/2007  . INTERSTITIAL CYSTITIS 09/06/2007  . HLD (hyperlipidemia) 03/01/2007  . Backache 03/01/2007   Past Medical History:  Past Medical History:  Diagnosis Date  . Adenomatous colon polyp 04/2005  . Allergic    Anectine  . ANXIETY 10/13/2007  . BACK PAIN 03/01/2007  . BREAST CANCER, HX OF 12/06/2007  . Cancer [facility])    RIGHT mastectomy with node dissection  . Complication of anesthesia    family aggergy to annectine  . Compression fracture of L4 lumbar vertebra ([facility]) 02/2013  . CONSTIPATION 01/06/2009  . DEPRESSION 10/13/2007  . Diverticular disease   . DIVERTICULOSIS, COLON 05/11/2005  . External hemorrhoids without mention of complication 8/67/6195  . FATIGUE 08/07/2008  . HEMORRHOIDS, INTERNAL 05/11/2005  . Hypercalcemia 04/23/2009  . HYPERLIPIDEMIA NEC/NOS 03/01/2007  . HYPERTENSION 10/13/2007   . Impaired fasting glucose 11/14/2008  . INTERSTITIAL CYSTITIS 09/06/2007  . Irritable bowel syndrome 01/06/2009  . LIVER MASS 05/15/2009  . OSTEOPOROSIS 05/09/2009  . PARESTHESIA 09/06/2007  . POLYCYTHEMIA 04/23/2009  . STENOSIS, SPINAL, UNSPC REGION 03/01/2007  . Thyroid disease    Past Surgical History:  Past Surgical History:  Procedure Laterality Date  . ABDOMINAL HYSTERECTOMY    . BACK SURGERY    . BREAST SURGERY     mastectomy -right  . CERVICAL SPINE SURGERY    . CHOLECYSTECTOMY    . COLONOSCOPY W/ BIOPSIES    . cortisone injection  10/16/12   left knee  . FIXATION KYPHOPLASTY LUMBAR SPINE    . HEMORRHOID BANDING  2015-16  . PARATHYROID EXPLORATION  06/29/2011   Procedure: PARATHYROID EXPLORATION;  Surgeon: Earnstine Regal, MD;  Location: [facility]S;  Service: General;  Laterality: Left;  Left Superior Parathyroidectomy  . PARATHYROIDECTOMY  06/29/11   Social History:  reports that she quit smoking about 39 years ago. Her smoking use included Cigarettes. She has a 42.00 pack-year smoking history. She has never used smokeless tobacco. She reports that she does not drink alcohol or use drugs.  Family / Support Systems Marital Status: Married How Long?: 42 years Patient Roles: Spouse, Other (Comment) (nephew's & niece and friends) Spouse/Significant Other: Spouse is in a ALF in [place] close to son-caused pt much stress-left 08/2015 Other Supports: Enid Derry Allen-friend/caregiver [phone] Berniece Salines [phone] Anticipated Caregiver: Enid Derry assists her 6 hours M-F Ability/Limitations of Caregiver: Pt does not have 24 hr care, Enid Derry may be able to increase her hours if needed. Caregiver Availability: Intermittent Family  Dynamics: Close with Enid Derry, nephews and niece. She has friends who are supportive and will visit her. Pt is very set in her wants and needs and feels she has lived long enough to do waht she wants now.  Social History Preferred language:  English Religion: Catholic Cultural Background: No issues Education: Some Data processing manager: Yes Write: Yes Employment Status: Retired Freight forwarder Issues: Separated from husband has not divorced him Guardian/Conservator: Nurse, mental health, but pt is capable of making her own decisions while here, according to MD.    Abuse/Neglect Physical Abuse: Denies Verbal Abuse: Denies Sexual Abuse: Denies Exploitation of patient/patient's resources: Denies Self-Neglect: Denies  Emotional Status Pt's affect, behavior adn adjustment status: Pt is motivated to do well and stay here long enough to get where she will not need to use a walker or cane. Discussed with pt this may not be the case whatever makes her safer ambulating. She will do what is asked of her, but is stubborn and has her own thoughts aobut things. Recent Psychosocial Issues: other health issues-recent admission with TIA-dc 4/27 and stress of husband leaving Pyschiatric History: Feels like she has PTSD from husband leaving, pt does rationalize she is doing actually better since he left. May benefit from seeing neuro-psych while here. She is on depression medications which she finds helpful. Substance Abuse History: No issues  Patient / Family Perceptions, Expectations & Goals Pt/Family understanding of illness & functional limitations: Pt and friend-Shirley can explain her stroke and deficits. She does speak with the MD and feels she has a good understanding of her condition. She is expecting to do well here and will try to abide by our rules. Her friend reports she is a stubborn lady. 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, friend, home owner, church memebr, etc Anticipated changes in roles/activities/participation: resume Pt/family expectations/goals: Pt states: " I want to be able to walk with nothing before I leave here." Shirely states: " She is hard headed she will probably reach this goal, she is very  independent."  [employer]: Other (Comment) (Intergrated Therapy for PT for her neuropathy) Premorbid Home Care/DME Agencies: None Transportation available at discharge: Shirely or patient Resource referrals recommended: Neuropsychology, Support group (specify)  Discharge Planning Living Arrangements: Alone Support Systems: Other relatives, Friends/neighbors Type of Residence: Private residence Insurance Resources: [employer], Multimedia programmer (specify) Human resources officer) Museum/gallery curator Resources: Family Support Financial Screen Referred: No Living Expenses: Own Money Management: Other (Comment) (Niece-POA manages the bills) Does the patient have any problems obtaining your medications?: No Home Management: Enid Derry does this when she is there M-F 6 hours Patient/Family Preliminary Plans: Return home with Enid Derry resuming her assist. She hopes to be able to stay alone the other times and be independent. She is being evaluated today and goals are being set for her rehab stay.  Social Work Anticipated Follow Up Needs: HH/OP, Support Group  Clinical Impression Pleasant very independent woman who is still recovering from her past TIA and now has been admitted again for another one. She is goal oriented and set on her the goals she wants to achieve while here. Will see if she can reach these goals. She has good supports via her nephews, niece and her friend/caregvier-Shirley. Will work on her discharge needs and await team's evaluations. Would benefit from seeing neuro-psych while here due to still adjusting to her husband leaving. Provide support and work on discharge needs.  Elease Hashimoto 01/11/2017, 10:39 AM

## 2017-01-11 NOTE — Progress Notes (Signed)
I received a return call from pt's niece by marriage, Jackalyn Lombard.  Helene Kelp confirms that she is patient's POA.  I updated Helene Kelp that pt. Has been admitted to [facility].  She is very pleased.  She asked about the estimated LOS and I informed her that we anticipate a short LOS of about 7 days.  Teresa's contact number is [phone].  [facility] Admissions Coordinator Cell [phone] Office [phone]

## 2017-01-11 NOTE — Care Management Note (Signed)
[facility] Individual Statement of Services  Patient Name:  [name]  Date:  01/11/2017  Welcome to the [facility].  Our goal is to provide you with an individualized program based on your diagnosis and situation, designed to meet your specific needs.  With this comprehensive rehabilitation program, you will be expected to participate in at least 3 hours of rehabilitation therapies Monday-Friday, with modified therapy programming on the weekends.  Your rehabilitation program will include the following services:  Physical Therapy (PT), Occupational Therapy (OT), Speech Therapy (ST), 24 hour per day rehabilitation nursing, Therapeutic Recreaction (TR), Neuropsychology, Case Management (Social Worker), Rehabilitation Medicine, Nutrition Services and Pharmacy Services  Weekly team conferences will be held on Wednesday to discuss your progress.  Your Social Worker will talk with you frequently to get your input and to update you on team discussions.  Team conferences with you and your family in attendance may also be held.  Expected length of stay: 10-12 days Overall anticipated outcome: Supervision with cueing  Depending on your progress and recovery, your program may change. Your Social Worker will coordinate services and will keep you informed of any changes. Your Social Worker's name and contact numbers are listed  below.  The following services may also be recommended but are not provided by the [facility] will be made to provide these services after discharge if needed.  Arrangements include referral to agencies that provide these services.  Your insurance has been verified to be:  [place] Your primary doctor is:  Seward Carol  Pertinent information will be shared with your doctor and your  insurance company.  Social Worker:  Ovidio Kin, [place] or ([phone]  Information discussed with and copy given to patient by: Elease Hashimoto, 01/11/2017, 10:45 AM

## 2017-01-12 ENCOUNTER — Inpatient Hospital Stay (HOSPITAL_COMMUNITY): Payer: Medicare Other | Admitting: Speech Pathology

## 2017-01-12 ENCOUNTER — Inpatient Hospital Stay (HOSPITAL_COMMUNITY): Payer: Medicare Other | Admitting: Occupational Therapy

## 2017-01-12 ENCOUNTER — Ambulatory Visit: Payer: Medicare Other | Admitting: Neurology

## 2017-01-12 ENCOUNTER — Inpatient Hospital Stay (HOSPITAL_COMMUNITY): Payer: Medicare Other

## 2017-01-12 ENCOUNTER — Encounter (HOSPITAL_COMMUNITY): Payer: Medicare Other | Admitting: Psychology

## 2017-01-12 ENCOUNTER — Inpatient Hospital Stay (HOSPITAL_COMMUNITY): Payer: Medicare Other | Admitting: Physical Therapy

## 2017-01-12 MED ORDER — DIAZEPAM 5 MG PO TABS
5.0000 mg | ORAL_TABLET | Freq: Every day | ORAL | Status: DC | PRN
  Administered 2017-01-12 – 2017-01-17 (×5): 5 mg via ORAL
  Filled 2017-01-12 (×5): qty 1

## 2017-01-12 NOTE — Progress Notes (Signed)
Speech Language Pathology Daily Session Note  Patient Details  Name: [name] MRN: [id number] Date of Birth: 1930-02-06  Today's Date: 01/12/2017 SLP Individual Time: 1405-1500 SLP Individual Time Calculation (min): 55 min  Short Term Goals: Week 1: SLP Short Term Goal 1 (Week 1): Pt will identify 2 phsycial deficits related to current CVA with Min A cues.  SLP Short Term Goal 2 (Week 1): Given Min A cues, pt will identify 2 activities that are safe for her to perform at home. SLP Short Term Goal 3 (Week 1): Pt will utilize speech intelligibility strategies at the semi-complex conversation level to achieve > 95% intelligibility.   Skilled Therapeutic Interventions: Skilled treatment session focused on addressing cognition goals. SLP facilitated session by providing a structured discussion regarding new deficits, which she required Max assist question cues to identify and then Max faded to Mod assist question cues to verbalize safe household activities for post discharge.  Patient's speech was fully intelligible throughout session and she reports that she is at her baseline level of performance; so goals not addressed this session.  Plan to monitor for 1-2 more sessions and then discharge if needed.  Continue with current plan of care.    Function:  Cognition Comprehension Comprehension assist level: Follows complex conversation/direction with extra time/assistive device  Expression   Expression assist level: Expresses complex ideas: With extra time/assistive device  Social Interaction Social Interaction assist level: Interacts appropriately with others with medication or extra time (anti-anxiety, antidepressant).  Problem Solving Problem solving assist level: Solves basic 90% of the time/requires cueing < 10% of the time (d/t baseline distractive verbose perseverations, baseline STM  deficits)  Memory Memory assist level: Recognizes or recalls 90% of the time/requires cueing < 10%  of the time (d/t baseline memory deficits)    Pain Pain Assessment Pain Assessment: No/denies pain  Therapy/Group: Individual Therapy  Carmelia Roller., [place] [phone]  [place] 01/12/2017, 3:57 PM

## 2017-01-12 NOTE — Progress Notes (Signed)
Physical Therapy Session Note  Patient Details  Name: [name] MRN: [id number] Date of Birth: [birth year]  Today's Date: 01/12/2017 PT Individual Time: 0902-1028 PT Individual Time Calculation (min): 86 min   Short Term Goals: Week 1:  PT Short Term Goal 1 (Week 1): Pt will demonstrate intellectual awareness with min question cues.  PT Short Term Goal 2 (Week 1): Pt will demonstrate safe transfers with rollator by locking brakes without cues in 75% of opportunities. PT Short Term Goal 3 (Week 1): Pt will ambulate with rollator and close supervision at least 50% of the time.   Skilled Therapeutic Interventions/Progress Updates:   Pt received sitting on toilet with RN present and agreeable to PT.Pt performed ambulatory transfer to EOB from toilet with Rollator and supervision assist.   Gait training in hall x 16ft, 283ft and 157ft with supervision assist from PT and min cues for attention to task and target destination. Toilet transfer for attempt to have second bowel movement with supervision assist from PT. PT required to provide verbal and visual cues for safety with use of RW around turns and with preparing to sit on various sitting surfaces including toilet, couch, and mat table.   Supine therex all exercises completed 2 x 12 BLE with min cues for improved eccentric control and proper ROM to improve strengthening and coordination aspects of activities :  Bridges  SLR Hip abduction Reciprocal hip flexion, level 3 tband  Clam shells. Level 3 tband   Throughout treatment, pt required supervision assist for transfers with moderate cues for proper Rollator positioning and use of brakes.  PT instructed pt in Berg balance test Patient demonstrates increased fall risk as noted by score of  38/56 on Berg Balance Scale. (<36= high risk for falls, close to 100%; 37-45 significant >80%; 46-51 moderate >50%; 52-55 lower >25%) Standing balance training with cross body reaches to the R  and the L oustside BOS. Supervision assist overall with one instance of min assist to prevent fall to the R.   Patient returned too room and left sitting in recliner with call bell in reach, chair alarm set, and all other needs needs met.           Therapy Documentation Precautions:  Precautions Precautions: Fall Precaution Comments: impulsive, poor L awareness Restrictions Weight Bearing Restrictions: No Pain: 0/10     Balance: Balance Balance Assessed: Yes Standardized Balance Assessment Standardized Balance Assessment: Berg Balance Test Berg Balance Test Sit to Stand: Able to stand  independently using hands Standing Unsupported: Able to stand safely 2 minutes Sitting with Back Unsupported but Feet Supported on Floor or Stool: Able to sit safely and securely 2 minutes Stand to Sit: Controls descent by using hands Transfers: Able to transfer safely, definite need of hands Standing Unsupported with Eyes Closed: Able to stand 10 seconds with supervision Standing Ubsupported with Feet Together: Able to place feet together independently but unable to hold for 30 seconds From Standing, Reach Forward with Outstretched Arm: Can reach forward >12 cm safely (5") From Standing Position, Pick up Object from Floor: Able to pick up shoe, needs supervision From Standing Position, Turn to Look Behind Over each Shoulder: Looks behind one side only/other side shows less weight shift Turn 360 Degrees: Able to turn 360 degrees safely but slowly Standing Unsupported, Alternately Place Feet on Step/Stool: Able to complete 4 steps without aid or supervision Standing Unsupported, One Foot in Front: Able to take small step independently and hold 30 seconds Standing on  One Leg: Tries to lift leg/unable to hold 3 seconds but remains standing independently Total Score: 38   See Function Navigator for Current Functional Status.   Therapy/Group: Individual Therapy  Lorie Phenix 01/12/2017,  11:27 AM

## 2017-01-12 NOTE — Progress Notes (Signed)
Occupational Therapy Session Note  Patient Details  Name: SKYELER SCALESE MRN: [id number] Date of Birth: 21-Feb-1930  Today's Date: 01/12/2017 OT Individual Time: 1100-1200 OT Individual Time Calculation (min): 60 min    Short Term Goals: Week 1:  OT Short Term Goal 1 (Week 1): Pt will demonstrate improved safety awareness with bathroom transfers by needing less than 2 safety cues. OT Short Term Goal 2 (Week 1): Pt will demonstrate improved R side coordination to don R sock and shoe independently. OT Short Term Goal 3 (Week 1): Pt will demonstrate improved balance by standing at the sink with supervision only to complete grooming.    Skilled Therapeutic Interventions/Progress Updates:    Pt seen for OT session focusing on fine motor coordination and functional use of R UE.  Pt supine upon arrival, denied bathing/dressing session. She ambulated throughout session with supervision using rollator, required VCs for initiation of use of rollator, pt with decreased safety awareness and awareness of deficits. Completed toileting task with supervision, VCs throughout for rollator brake management throughout session.  She ambulated to therapy gym where she completed fine motor activities with R UE focusing on pincer grasp, transition and translation of small objects. She completed tasks with increased time, however, displayed difficulty following multi-step commands of activities throughout tasks.. In ADL apartment, pt practiced removing and replacing objects from overhead cabinet, pt demonstrates full ROM and functional strength to complete task. Following seated rest break, pt ambulated back to room. Left seated in recliner with chair alarm on. Cont to educate regarding need for assist with mobility and use of call bell though pt cont to voice "wanting to see what she can do and challenge myself" with mobility. Educated regarding high fall risk and need for assist , she voiced understanding.     Therapy Documentation Precautions:  Precautions Precautions: Fall Precaution Comments: impulsive, poor L awareness Restrictions Weight Bearing Restrictions: No Pain:   No/ denies pain ADL: ADL ADL Comments: refer to functional navigator  See Function Navigator for Current Functional Status.   Therapy/Group: Individual Therapy  Lewis, Zian Delair C 01/12/2017, 7:11 AM

## 2017-01-12 NOTE — Progress Notes (Signed)
Subjective/Complaints: No issues overnite  ROS neg for CP/SOB, N/V/D  Objective: Vital Signs: Blood pressure (!) 141/50, pulse 81, temperature 98.6 F (37 C), temperature source Oral, resp. rate 17, height 5' 3"  (1.6 m), weight 80.6 kg (177 lb 9.6 oz), SpO2 96 %. No results found. Results for orders placed or performed during the hospital encounter of 01/10/17 (from the past 72 hour(s))  CBC WITH DIFFERENTIAL     Status: None   Collection Time: 01/11/17  4:39 AM  Result Value Ref Range   WBC 7.1 4.0 - 10.5 K/uL   RBC 4.70 3.87 - 5.11 MIL/uL   Hemoglobin 13.9 12.0 - 15.0 g/dL   HCT 42.5 36.0 - 46.0 %   MCV 90.4 78.0 - 100.0 fL   MCH 29.6 26.0 - 34.0 pg   MCHC 32.7 30.0 - 36.0 g/dL   RDW 12.5 11.5 - 15.5 %   Platelets 165 150 - 400 K/uL   Neutrophils Relative % 42 %   Neutro Abs 2.9 1.7 - 7.7 K/uL   Lymphocytes Relative 48 %   Lymphs Abs 3.4 0.7 - 4.0 K/uL   Monocytes Relative 7 %   Monocytes Absolute 0.5 0.1 - 1.0 K/uL   Eosinophils Relative 2 %   Eosinophils Absolute 0.2 0.0 - 0.7 K/uL   Basophils Relative 1 %   Basophils Absolute 0.0 0.0 - 0.1 K/uL  Comprehensive metabolic panel     Status: Abnormal   Collection Time: 01/11/17  4:39 AM  Result Value Ref Range   Sodium 138 135 - 145 mmol/L   Potassium 3.7 3.5 - 5.1 mmol/L   Chloride 102 101 - 111 mmol/L   CO2 27 22 - 32 mmol/L   Glucose, Bld 115 (H) 65 - 99 mg/dL   BUN 19 6 - 20 mg/dL   Creatinine, Ser 0.93 0.44 - 1.00 mg/dL   Calcium 8.9 8.9 - 10.3 mg/dL   Total Protein 5.9 (L) 6.5 - 8.1 g/dL   Albumin 3.7 3.5 - 5.0 g/dL   AST 17 15 - 41 U/L   ALT 17 14 - 54 U/L   Alkaline Phosphatase 56 38 - 126 U/L   Total Bilirubin 0.4 0.3 - 1.2 mg/dL   GFR calc non Af Amer 55 (L) >60 mL/min   GFR calc Af Amer >60 >60 mL/min    Comment: (NOTE) The eGFR has been calculated using the CKD EPI equation. This calculation has not been validated in all clinical situations. eGFR's persistently <60 mL/min signify possible Chronic  Kidney Disease.    Anion gap 9 5 - 15     HEENT: normal Cardio: RRR and no murmur Resp: CTA B/L and unlabored GI: BS positive and NT, ND Extremity:  Pulses positive and No Edema Skin:   Intact Neuro: Alert/Oriented, Abnormal Sensory mildly diminished R hemifacial and RUE, 4+/5  Motor on RIght, Abnormal FMC Ataxic/ dec FMC and Other word finding  finding deficits Musc/Skel:  No pain with ROM    Assessment/Plan: 1. Functional deficits secondary to Left thalamic infarct which require 3+ hours per day of interdisciplinary therapy in a comprehensive inpatient rehab setting. Physiatrist is providing close team supervision and 24 hour management of active medical problems listed below. Physiatrist and rehab team continue to assess barriers to discharge/monitor patient progress toward functional and medical goals. FIM: Function - Bathing Position: Shower Body parts bathed by patient: Right arm, Left arm, Chest, Abdomen, Front perineal area, Buttocks, Right upper leg, Left upper leg, Right lower leg, Left  lower leg, Back Assist Level: Touching or steadying assistance(Pt > 75%)  Function- Upper Body Dressing/Undressing What is the patient wearing?: Pull over shirt/dress Pull over shirt/dress - Perfomed by patient: Thread/unthread right sleeve, Thread/unthread left sleeve, Put head through opening, Pull shirt over trunk Assist Level: Set up Function - Lower Body Dressing/Undressing What is the patient wearing?: Underwear, Pants, Ted Hose, Non-skid slipper socks Position: Other (comment) (sitting on toilet) Underwear - Performed by patient: Thread/unthread right underwear leg, Thread/unthread left underwear leg, Pull underwear up/down Pants- Performed by patient: Thread/unthread right pants leg, Thread/unthread left pants leg, Pull pants up/down Non-skid slipper socks- Performed by patient: Don/doff left sock Non-skid slipper socks- Performed by helper: Don/doff right sock TED Hose -  Performed by helper: Don/doff right TED hose, Don/doff left TED hose Assist for footwear: Partial/moderate assist Assist for lower body dressing: Touching or steadying assistance (Pt > 75%), Supervision or verbal cues  Function - Toileting Toileting steps completed by patient: Adjust clothing prior to toileting, Performs perineal hygiene, Adjust clothing after toileting Toileting Assistive Devices: Grab bar or rail Assist level: Supervision or verbal cues  Function - Air cabin crew transfer assistive device: Landscape architect, Walker Assist level to toilet: Touching or steadying assistance (Pt > 75%) Assist level from toilet: Touching or steadying assistance (Pt > 75%)  Function - Chair/bed transfer Chair/bed transfer method: Stand pivot, Ambulatory Chair/bed transfer assist level: Touching or steadying assistance (Pt > 75%) Chair/bed transfer assistive device: Walker Chair/bed transfer details: Tactile cues for weight shifting, Tactile cues for posture, Tactile cues for placement  Function - Locomotion: Wheelchair Will patient use wheelchair at discharge?: No Function - Locomotion: Ambulation Assistive device: Walker-rolling, No device Max distance: 150 Assist level: Touching or steadying assistance (Pt > 75%) Assist level: Touching or steadying assistance (Pt > 75%) Assist level: Touching or steadying assistance (Pt > 75%) Assist level: Touching or steadying assistance (Pt > 75%) Assist level: Moderate assist (Pt 50 - 74%)  Function - Comprehension Comprehension: Auditory Comprehension assist level: Follows basic conversation/direction with extra time/assistive device  Function - Expression Expression: Verbal Expression assist level: Expresses complex ideas: With no assist  Function - Social Interaction Social Interaction assist level: Interacts appropriately with others with medication or extra time (anti-anxiety, antidepressant).  Function - Problem Solving Problem  solving assist level: Solves basic 75 - 89% of the time/requires cueing 10 - 24% of the time  Function - Memory Memory assist level: Recognizes or recalls 90% of the time/requires cueing < 10% of the time Patient normally able to recall (first 3 days only): Current season, Location of own room, Staff names and faces, That he or she is in a hospital  Medical Problem List and Plan: 1.  Gait disorder secondary to Left thalamic infarct as well as history of TIA Team conference today please see physician documentation under team conference tab, met with team face-to-face to discuss problems,progress, and goals. Formulized individual treatment plan based on medical history, underlying problem and comorbidities. 2.  DVT Prophylaxis/Anticoagulation: Subcutaneous Lovenox. Monitor platelet counts of any signs of bleeding 3. Pain Management/back pain: Celebrex 200 mg daily as needed, Soma 350 mg daily as needed as prior to admission, Oxycodone immediate release as needed 4. Mood: Wellbutrin XL 300 mg daily, Valium 10 mg daily as needed anxiety as prior to admission 5. Neuropsych: This patient is capable of making decisions on her own behalf.Neuropsych eval today 6. Skin/Wound Care: Routine skin checks 7. Fluids/Electrolytes/Nutrition: Routine I&O with follow-up chemistries 8. Hypertension. Lopressor  12.5 mg twice a day. Monitor with increased mobility Vitals:   01/11/17 2121 01/12/17 0712  BP: (!) 157/51 (!) 141/50  Pulse: 79 81  Resp:  17  Temp:  98.6 F (37 C)    9. Hyperlipidemia. Pravachol 10. History of right breast cancer with mastectomy  LOS (Days) 2 A FACE TO FACE EVALUATION WAS PERFORMED  Demarquis Osley E 01/12/2017, 8:15 AM
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
## 2017-01-13 ENCOUNTER — Inpatient Hospital Stay (HOSPITAL_COMMUNITY): Payer: Medicare Other | Admitting: Speech Pathology

## 2017-01-13 ENCOUNTER — Inpatient Hospital Stay (HOSPITAL_COMMUNITY): Payer: Medicare Other | Admitting: Physical Therapy

## 2017-01-13 ENCOUNTER — Inpatient Hospital Stay (HOSPITAL_COMMUNITY): Payer: Medicare Other | Admitting: Occupational Therapy

## 2017-01-13 DIAGNOSIS — R209 Unspecified disturbances of skin sensation: Secondary | ICD-10-CM

## 2017-01-13 NOTE — Patient Care Conference (Signed)
Inpatient RehabilitationTeam Conference and Plan of Care Update Date: 01/12/2017   Time: 2:25 PM    Patient Name: [name]      Medical Record Number: [id number]  Date of Birth: 08-31-1929 Sex: Female         Room/Bed: 4M04C/4M04C-01 Payor Info: Payor: MEDICARE / Plan: MEDICARE PART A AND B / Product Type: *No Product type* /    Admitting Diagnosis: L CVA  cognitive issues  Admit Date/Time:  01/10/2017  6:49 PM Admission Comments: No comment available   Primary Diagnosis:  <principal problem not specified> Principal Problem: <principal problem not specified>  Patient Active Problem List   Diagnosis Date Noted  . Acute arterial ischemic stroke, vertebrobasilar, thalamic, left ([place]) 01/10/2017  . Neurologic gait disorder   . Depression   . Benign essential HTN   . History of breast cancer   . Stroke (cerebrum) ([place]) 01/08/2017  . TIA (transient ischemic attack) 12/16/2016  . Dysarthria 12/16/2016  . Leg weakness 12/16/2016  . EKG abnormalities 12/16/2016  . Chronic pain 12/16/2016  . Elevated blood pressure 12/22/2014  . Internal hemorrhoids with bleeding and prolapse 11/04/2014  . Lactose intolerance 11/04/2014  . Hemorrhoid 09/11/2014  . Rectal bleeding 09/11/2014  . IBS (irritable bowel syndrome) 09/25/2013  . Hyperparathyroidism, primary ([place]) 05/26/2011  . Osteoporosis 05/09/2009  . POLYCYTHEMIA 04/23/2009  . IMPAIRED FASTING GLUCOSE 11/14/2008  . BREAST CANCER, HX OF 12/06/2007  . Anxiety state 10/13/2007  . Essential hypertension 10/13/2007  . INTERSTITIAL CYSTITIS 09/06/2007  . HLD (hyperlipidemia) 03/01/2007  . Backache 03/01/2007    Expected Discharge Date: Expected Discharge Date: 01/19/17  Team Members Present: Physician leading conference: Dr. Alysia Penna Social Worker Present: Ovidio Kin, LCSW Nurse Present: Other (comment) Alda Lea) PT Present: Dwyane Dee, PT OT Present: Cherylynn Ridges, OT SLP Present: Weston Anna,  SLP PPS Coordinator present : Daiva Nakayama, RN, CRRN     Current Status/Progress Goal Weekly Team Focus  Medical   high fall risk, only has 5-10 min retention of info  reduce fall risk  neuropsych eval   Bowel/Bladder   Continent of bowel and bladder with urgency/ LBM 01/10/17  remain continent  monitor bowel and bladder q shift   Swallow/Nutrition/ Hydration             ADL's   Supervision- min A ADLs; mod-max A cognition for safety awareness and awareness of deficits  Supervision overall  Cognitive re-training/ safety awareness, neuro re-ed, ADL re-training d/c planning   Mobility   Min assist overall with gait and transfers with Rollator.   Supervision assist with LRAD for safety due to cognitive deficits.   balance, endurance, safety with gait and transfers in home and community environments.    Communication   Supervision   Mod I   education and use of speech intelligiblity strategies    Safety/Cognition/ Behavioral Observations  impulsive with urgency /bathroom needs/SMD to use call bell for staff  Min A  Increase safety awareness   Pain   left knee pain/voltaren gel  pain less than 2  monitor pain q shift   Skin   no skin breakdown  no skin breakdown this admission  monitor skin q shift    Rehab Goals Patient on target to meet rehab goals: Yes *See Care Plan and progress notes for long and short-term goals.  Barriers to Discharge: cognition limits safety    Possible Resolutions to Barriers:  Identidy 24/7 caregiver    Discharge Planning/Teaching Needs:  Home with  Enid Derry assisting may be able to increase her hours but pt will not have 24 hr care. Pt wants to be mod/i prior to discharge.      Team Discussion:  Goals supervision level due to impulsive and safety issues with memory deficits. Neuro-psych seeing. X-ray of knee today-causing pt pain-using volteran gel on. BERG 38-56-high fall risk. Work on a safe discharge plan  Revisions to Treatment Plan:  DC 5/30    Continued Need for Acute Rehabilitation Level of Care: The patient requires daily medical management by a physician with specialized training in physical medicine and rehabilitation for the following conditions: Daily direction of a multidisciplinary physical rehabilitation program to ensure safe treatment while eliciting the highest outcome that is of practical value to the patient.: Yes Daily medical management of patient stability for increased activity during participation in an intensive rehabilitation regime.: Yes Daily analysis of laboratory values and/or radiology reports with any subsequent need for medication adjustment of medical intervention for : Neurological problems  [name], [name] 01/13/2017, 10:15 AM

## 2017-01-13 NOTE — Progress Notes (Signed)
Social Work Patient ID: [name], female   DOB: 1929-10-07, 83 y.o.   MRN: [id number] Pt feels she will not need 24 hr supervision nor does she want someone in her home that she doesn't know. Shirley-her caregiver has been here and attended therapies with pt. She feels pt is back to her baseline which the therapy team would have recommended 24 hr supervision due to her high risk to fall at home due to memory and balance issues. POA aware of pt's personality and hard headedness, just wants her to be as safe as she can at home. Will work on discharge Needs and plan for discharge 5/30.

## 2017-01-13 NOTE — Plan of Care (Signed)
Problem: RH KNOWLEDGE DEFICIT Goal: RH STG INCREASE KNOWLEDGE OF HYPERTENSION Outcome: Not Progressing Pt unable to retain information due to short term memory deficiets

## 2017-01-13 NOTE — Progress Notes (Signed)
Physical Therapy Session Note  Patient Details  Name: [name] MRN: [id number] Date of Birth: 01-19-1930  Today's Date: 01/13/2017 PT Individual Time: 1130-1200 AND 1300-1345 PT Individual Time Calculation (min): 30 min ANd 45 min  Short Term Goals: Week 1:  PT Short Term Goal 1 (Week 1): Pt will demonstrate intellectual awareness with min question cues.  PT Short Term Goal 2 (Week 1): Pt will demonstrate safe transfers with rollator by locking brakes without cues in 75% of opportunities. PT Short Term Goal 3 (Week 1): Pt will ambulate with rollator and close supervision at least 50% of the time.   Skilled Therapeutic Interventions/Progress Updates:    session 1 Pt received supine in bed and agreeable to PT. Supine>sit transfer without assist or cues. Pt provided min assist for set up for patient to don shoes sitting EOB. Gait training through hall with rollator x [id number] and [id number] assist from PT min cues for safety in turns and for decreased speed. Additional gait training without AD x [id number] supervision assist. Pt able to self correct speed when beginning to demonstrate anterior LOB. Gait over ramp and mulched surface with Rollator and supervision assist for safety cues. Sit<>stand completed from various heights x15 with supervision assist and min cues for set up and safety. Patient returned too room and [facility] in reach and all needs met.    Session 2 Gait training without RW and supervision assist from PT, as well as max cues for improved safety including increased step length on the R as well as decreased cadence to improve stability. Gait training also performed with Rollotor x [id number] supervision assist from PT.   Standing balance while shooting ball to 6ft 1fketball goal x 5 min. One anterior LOB with min assist to prevent LOB. Connect four while standing on airex pad and min assist progressing to supervision assist. Moderate cues from  PT to maintain selective attention. Balance on 3 inch wedge with max assist progressing to min assist to prevnet posterior LOB. PT instructed pt in use of hip strategy to prevent LOB. Pt returned to room and performed ambulatory transfer to bed with supervision assist. Sit>supine completed without assist and pt left supine in bed with call bell in reach and all needs met.       Therapy Documentation Precautions:  Precautions Precautions: Fall Precaution Comments: impulsive, poor L awareness Restrictions Weight Bearing Restrictions: No   Pain: 0/10   See Function Navigator for Current Functional Status.   Therapy/Group: Individual Therapy  AustiLorie Phenix/2018, 12:56 PM

## 2017-01-13 NOTE — Progress Notes (Signed)
Social Work Jerrye Seebeck, Eliezer Champagne Social Worker Signed   Patient Care Conference Date of Service: 01/13/2017 10:15 AM      Hide copied text Hover for attribution information Inpatient RehabilitationTeam Conference and Plan of Care Update Date: 01/12/2017   Time: 2:25 PM      Patient Name: KERAH HARDEBECK      Medical Record Number: [id number]  Date of Birth: 12-10-1929 Sex: Female         Room/Bed: 4M04C/4M04C-01 Payor Info: Payor: MEDICARE / Plan: MEDICARE PART A AND B / Product Type: *No Product type* /     Admitting Diagnosis: L CVA  cognitive issues  Admit Date/Time:  01/10/2017  6:49 PM Admission Comments: No comment available    Primary Diagnosis:  <principal problem not specified> Principal Problem: <principal problem not specified>       Patient Active Problem List    Diagnosis Date Noted  . Acute arterial ischemic stroke, vertebrobasilar, thalamic, left ([place]) 01/10/2017  . Neurologic gait disorder    . Depression    . Benign essential HTN    . History of breast cancer    . Stroke (cerebrum) ([place]) 01/08/2017  . TIA (transient ischemic attack) 12/16/2016  . Dysarthria 12/16/2016  . Leg weakness 12/16/2016  . EKG abnormalities 12/16/2016  . Chronic pain 12/16/2016  . Elevated blood pressure 12/22/2014  . Internal hemorrhoids with bleeding and prolapse 11/04/2014  . Lactose intolerance 11/04/2014  . Hemorrhoid 09/11/2014  . Rectal bleeding 09/11/2014  . IBS (irritable bowel syndrome) 09/25/2013  . Hyperparathyroidism, primary ([place]) 05/26/2011  . Osteoporosis 05/09/2009  . POLYCYTHEMIA 04/23/2009  . IMPAIRED FASTING GLUCOSE 11/14/2008  . BREAST CANCER, HX OF 12/06/2007  . Anxiety state 10/13/2007  . Essential hypertension 10/13/2007  . INTERSTITIAL CYSTITIS 09/06/2007  . HLD (hyperlipidemia) 03/01/2007  . Backache 03/01/2007      Expected Discharge Date: Expected Discharge Date: 01/19/17   Team Members Present: Physician leading conference: Dr. Alysia Penna Social Worker Present: Ovidio Kin, LCSW Nurse Present: Other (comment) Alda Lea) PT Present: Dwyane Dee, PT OT Present: Cherylynn Ridges, OT SLP Present: Weston Anna, SLP PPS Coordinator present : Daiva Nakayama, RN, CRRN       Current Status/Progress Goal Weekly Team Focus  Medical     high fall risk, only has 5-10 min retention of info  reduce fall risk  neuropsych eval   Bowel/Bladder     Continent of bowel and bladder with urgency/ LBM 01/10/17  remain continent  monitor bowel and bladder q shift   Swallow/Nutrition/ Hydration               ADL's     Supervision- min A ADLs; mod-max A cognition for safety awareness and awareness of deficits  Supervision overall  Cognitive re-training/ safety awareness, neuro re-ed, ADL re-training d/c planning   Mobility     Min assist overall with gait and transfers with Rollator.   Supervision assist with LRAD for safety due to cognitive deficits.   balance, endurance, safety with gait and transfers in home and community environments.    Communication     Supervision   Mod I   education and use of speech intelligiblity strategies    Safety/Cognition/ Behavioral Observations   impulsive with urgency /bathroom needs/SMD to use call bell for staff  Min A  Increase safety awareness   Pain     left knee pain/voltaren gel  pain less than 2  monitor pain q shift   Skin  no skin breakdown  no skin breakdown this admission  monitor skin q shift     Rehab Goals Patient on target to meet rehab goals: Yes *See Care Plan and progress notes for long and short-term goals.   Barriers to Discharge: cognition limits safety     Possible Resolutions to Barriers:  Identidy 24/7 caregiver     Discharge Planning/Teaching Needs:  Home with Enid Derry assisting may be able to increase her hours but pt will not have 24 hr care. Pt wants to be mod/i prior to discharge.      Team Discussion:  Goals supervision level due to  impulsive and safety issues with memory deficits. Neuro-psych seeing. X-ray of knee today-causing pt pain-using volteran gel on. BERG 38-56-high fall risk. Work on a safe discharge plan  Revisions to Treatment Plan:  DC 5/30    Continued Need for Acute Rehabilitation Level of Care: The patient requires daily medical management by a physician with specialized training in physical medicine and rehabilitation for the following conditions: Daily direction of a multidisciplinary physical rehabilitation program to ensure safe treatment while eliciting the highest outcome that is of practical value to the patient.: Yes Daily medical management of patient stability for increased activity during participation in an intensive rehabilitation regime.: Yes Daily analysis of laboratory values and/or radiology reports with any subsequent need for medication adjustment of medical intervention for : Neurological problems   Elease Hashimoto 01/13/2017, 10:15 AM       Patient ID: [name], female   DOB: November 15, 1929, 83 y.o.   MRN: [id number]

## 2017-01-13 NOTE — Progress Notes (Signed)
Speech Language Pathology Daily Session Note  Patient Details  Name: [name] MRN: [id number] Date of Birth: May 11, 1930  Today's Date: 01/13/2017 SLP Individual Time: 0830-0930 SLP Individual Time Calculation (min): 60 min  Short Term Goals: Week 1: SLP Short Term Goal 1 (Week 1): Pt will identify 2 phsycial deficits related to current CVA with Min A cues.  SLP Short Term Goal 2 (Week 1): Given Min A cues, pt will identify 2 activities that are safe for her to perform at home. SLP Short Term Goal 3 (Week 1): Pt will utilize speech intelligibility strategies at the semi-complex conversation level to achieve > 95% intelligibility.   Skilled Therapeutic Interventions: Skilled treatment session focused on cognition goals. SLP facilitated session by providing Max A multimodal cues to problem solve safety situations in picture cards. Pt with no ability to state problem or correct resolution. Pt required Mod A for safe use of Rollator and Min A to supervision cues to safety ambulate to bathroom. Pt's speech was intelligible throughout session. Extensive education provided to pt that she was unsafe to be at home alone d/t new deficits in intellectual awareness from recent CVA. Pt didn't demonstrate any understanding or retention of this information. Pt was returned to room, left in bed, bed alarm on and all needs within reach. Continue per current plan of care.      Function:    Cognition Comprehension Comprehension assist level: Follows basic conversation/direction with extra time/assistive device  Expression   Expression assist level: Expresses basic needs/ideas: With no assist  Social Interaction Social Interaction assist level: Interacts appropriately with others with medication or extra time (anti-anxiety, antidepressant).  Problem Solving Problem solving assist level: Solves basic 25 - 49% of the time - needs direction more than half the time to initiate, plan or complete simple  activities  Memory Memory assist level: Recognizes or recalls 50 - 74% of the time/requires cueing 25 - 49% of the time    Pain    Therapy/Group: Individual Therapy   [name] B. Rutherford Nail, M.S., CCC-SLP Speech-Language Pathologist   Tarig Zimmers 01/13/2017, 9:42 AM

## 2017-01-13 NOTE — Progress Notes (Signed)
Subjective/Complaints:  Pt had a good night , discussed stroke deficits mainly sensory as well as cognitive ROS neg for CP/SOB, N/V/D  Objective: Vital Signs: Blood pressure (!) 155/65, pulse 74, temperature 98.5 F (36.9 C), temperature source Oral, resp. rate 16, height _0  (1.6 m), weight 80.6 kg (177 lb 9.6 oz), SpO2 95 %. Dg Knee 1-2 Views Left  Result Date: 01/12/2017 CLINICAL DATA:  Anterior posterior LEFT knee pain for a few years since a fall, intermittent swelling and difficulty bearing weight, initial encounter EXAM: LEFT KNEE - 1-2 VIEW COMPARISON:  None FINDINGS: Osseous demineralization. Joint spaces preserved. No acute fracture, dislocation, or bone destruction. No knee joint effusion. Tiny patellar spur at quadriceps tendon insertion. IMPRESSION: No significant osseous abnormalities. Electronically Signed   By: Lavonia [name] M.D.   On: 01/12/2017 13:32   Results for orders placed or performed during the hospital encounter of 01/10/17 (from the past 72 hour(s))  CBC WITH DIFFERENTIAL     Status: None   Collection Time: 01/11/17  4:39 AM  Result Value Ref Range   WBC 7.1 4.0 - 10.5 K/uL   RBC 4.70 3.87 - 5.11 MIL/uL   Hemoglobin 13.9 12.0 - 15.0 g/dL   HCT 42.5 36.0 - 46.0 %   MCV 90.4 78.0 - 100.0 fL   MCH 29.6 26.0 - 34.0 pg   MCHC 32.7 30.0 - 36.0 g/dL   RDW 12.5 11.5 - 15.5 %   Platelets 165 150 - 400 K/uL   Neutrophils Relative % 42 %   Neutro Abs 2.9 1.7 - 7.7 K/uL   Lymphocytes Relative 48 %   Lymphs Abs 3.4 0.7 - 4.0 K/uL   Monocytes Relative 7 %   Monocytes Absolute 0.5 0.1 - 1.0 K/uL   Eosinophils Relative 2 %   Eosinophils Absolute 0.2 0.0 - 0.7 K/uL   Basophils Relative 1 %   Basophils Absolute 0.0 0.0 - 0.1 K/uL  Comprehensive metabolic panel     Status: Abnormal   Collection Time: 01/11/17  4:39 AM  Result Value Ref Range   Sodium 138 135 - 145 mmol/L   Potassium 3.7 3.5 - 5.1 mmol/L   Chloride 102 101 - 111 mmol/L   CO2 27 22 - 32 mmol/L   Glucose, Bld 115 (H) 65 - 99 mg/dL   BUN 19 6 - 20 mg/dL   Creatinine, Ser 0.93 0.44 - 1.00 mg/dL   Calcium 8.9 8.9 - 10.3 mg/dL   Total Protein 5.9 (L) 6.5 - 8.1 g/dL   Albumin 3.7 3.5 - 5.0 g/dL   AST 17 15 - 41 U/L   ALT 17 14 - 54 U/L   Alkaline Phosphatase 56 38 - 126 U/L   Total Bilirubin 0.4 0.3 - 1.2 mg/dL   GFR calc non Af Amer 55 (L) >60 mL/min   GFR calc Af Amer >60 >60 mL/min    Comment: (NOTE) The eGFR has been calculated using the CKD EPI equation. This calculation has not been validated in all clinical situations. eGFR's persistently <60 mL/min signify possible Chronic Kidney Disease.    Anion gap 9 5 - 15     HEENT: normal Cardio: RRR and no murmur Resp: CTA B/L and unlabored GI: BS positive and NT, ND Extremity:  Pulses positive and No Edema Skin:   Intact Neuro: Alert/Oriented, Abnormal Sensory mildly diminished R hemifacial and RUE, 4+/5  Motor on RIght, Abnormal FMC Ataxic/ dec FMC and Other word finding  finding deficits Musc/Skel:  No  pain with ROM    Assessment/Plan: 1. Functional deficits secondary to Left thalamic infarct which require 3+ hours per day of interdisciplinary therapy in a comprehensive inpatient rehab setting. Physiatrist is providing close team supervision and 24 hour management of active medical problems listed below. Physiatrist and rehab team continue to assess barriers to discharge/monitor patient progress toward functional and medical goals. FIM: Function - Bathing Position: Shower Body parts bathed by patient: Right arm, Left arm, Chest, Abdomen, Front perineal area, Buttocks, Right upper leg, Left upper leg, Right lower leg, Left lower leg, Back Assist Level: Touching or steadying assistance(Pt > 75%)  Function- Upper Body Dressing/Undressing What is the patient wearing?: Pull over shirt/dress Pull over shirt/dress - Perfomed by patient: Thread/unthread right sleeve, Thread/unthread left sleeve, Put head through opening, Pull  shirt over trunk Assist Level: Set up Function - Lower Body Dressing/Undressing What is the patient wearing?: Underwear, Pants, Ted Hose, Non-skid slipper socks Position: Other (comment) (sitting on toilet) Underwear - Performed by patient: Thread/unthread right underwear leg, Thread/unthread left underwear leg, Pull underwear up/down Pants- Performed by patient: Thread/unthread right pants leg, Thread/unthread left pants leg, Pull pants up/down Non-skid slipper socks- Performed by patient: Don/doff left sock Non-skid slipper socks- Performed by helper: Don/doff right sock TED Hose - Performed by helper: Don/doff right TED hose, Don/doff left TED hose Assist for footwear: Partial/moderate assist Assist for lower body dressing: Touching or steadying assistance (Pt > 75%), Supervision or verbal cues  Function - Toileting Toileting steps completed by patient: Adjust clothing prior to toileting, Performs perineal hygiene, Adjust clothing after toileting Toileting Assistive Devices: Grab bar or rail Assist level: More than reasonable time, Supervision or verbal cues  Function - Air cabin crew transfer assistive device: Grab bar, Walker Assist level to toilet: Touching or steadying assistance (Pt > 75%) Assist level from toilet: Touching or steadying assistance (Pt > 75%)  Function - Chair/bed transfer Chair/bed transfer method: Stand pivot Chair/bed transfer assist level: Supervision or verbal cues Chair/bed transfer assistive device: Walker Chair/bed transfer details: Tactile cues for weight shifting, Tactile cues for posture, Tactile cues for placement  Function - Locomotion: Wheelchair Will patient use wheelchair at discharge?: No Function - Locomotion: Ambulation Assistive device: Walker-rolling Max distance: 22f  Assist level: Supervision or verbal cues Assist level: Supervision or verbal cues Assist level: Supervision or verbal cues Assist level: Supervision or verbal  cues Assist level: Moderate assist (Pt 50 - 74%)  Function - Comprehension Comprehension: Auditory Comprehension assist level: Follows complex conversation/direction with extra time/assistive device  Function - Expression Expression: Verbal Expression assist level: Expresses complex 90% of the time/cues < 10% of the time  Function - Social Interaction Social Interaction assist level: Interacts appropriately with others with medication or extra time (anti-anxiety, antidepressant).  Function - Problem Solving Problem solving assist level: Solves basic 25 - 49% of the time - needs direction more than half the time to initiate, plan or complete simple activities  Function - Memory Memory assist level: Recognizes or recalls 90% of the time/requires cueing < 10% of the time Patient normally able to recall (first 3 days only): Current season, Location of own room, Staff names and faces, That he or she is in a hospital  Medical Problem List and Plan: 1.  Gait disorder secondary to Left thalamic infarct as well as history of TIA Cont CIR PT, OT- we discussed her difficulty with new learning "pt states she doesn't like to be told what to do" 2.  DVT Prophylaxis/Anticoagulation:  Subcutaneous Lovenox. Monitor platelet counts of any signs of bleeding 3. Pain Management/back pain: Celebrex 200 mg daily as needed, Soma 350 mg daily as needed as prior to admission, Oxycodone immediate release as needed 4. Mood: Wellbutrin XL 300 mg daily, Valium 10 mg daily as needed anxiety as prior to admission 5. Neuropsych: This patient is capable of making decisions on her own behalf.Neuropsych eval appreciated 6. Skin/Wound Care: Routine skin checks 7. Fluids/Electrolytes/Nutrition: Routine I&O with follow-up chemistries 8. Hypertension. Lopressor 12.5 mg twice a day. Monitor with increased mobility Vitals:   01/12/17 2123 01/13/17 0546  BP: 140/84 (!) 155/65  Pulse: 100 74  Resp:  16  Temp:  98.5 F (36.9  C)    9. Hyperlipidemia. Pravachol 10. History of right breast cancer with mastectomy  LOS (Days) 3 A FACE TO FACE EVALUATION WAS PERFORMED  KIRSTEINS,ANDREW E 01/13/2017, 7:54 AM

## 2017-01-13 NOTE — Progress Notes (Signed)
Occupational Therapy Session Note  Patient Details  Name: [name] MRN: [id number] Date of Birth: Jan 15, 1930  Today's Date: 01/13/2017 OT Individual Time: 1856-3149 OT Individual Time Calculation (min): 60 min    Short Term Goals: Week 1:  OT Short Term Goal 1 (Week 1): Pt will demonstrate improved safety awareness with bathroom transfers by needing less than 2 safety cues. OT Short Term Goal 2 (Week 1): Pt will demonstrate improved R side coordination to don R sock and shoe independently. OT Short Term Goal 3 (Week 1): Pt will demonstrate improved balance by standing at the sink with supervision only to complete grooming.    Skilled Therapeutic Interventions/Progress Updates:    Pt seen for OT ADL bathing/dressing session. Pt in supine upon arrival, with encouragement, pt agreeable to tx session. She ambulated throughout session with rollator with supervision, however, requires cues throughout for rollator management including locking brakes and managing in functional context.  She bathed seated on tub bench with supervision. Throughout session, required encouragement to use R UE in functional context and for increased independence in general as pt very quick to assist for unnecessary help (i.e. Opening containers, tying shoes, etc).  She required frequent VCs for safety awareness during bathing/dressing, pt attempting to stand on one foot to dry feet, unable to recall/ following cuing for safety awareness. She completed grooming tasks standing at sink with distant supervision, using B UEs simultaneously without difficulty.  She ambulated throughout unit using rollator with supervision VCs for consistent ambulation speed. Attempted ambulation without use of AD, required min-mod A.  Educated extensively regarding pt's high fall risk and baseline poor safety awareness. Pt's caregiver present and stated pt's cognition and safety awareness is at baseline level. Pt returned to room at end  of session, left seated EOB with caregiver burden, all needs in reach.    Therapy Documentation Precautions:  Precautions Precautions: Fall Precaution Comments: impulsive, poor L awareness Restrictions Weight Bearing Restrictions: No Pain:   No/ denies pain ADL: ADL ADL Comments: refer to functional navigator  See Function Navigator for Current Functional Status.   Therapy/Group: Individual Therapy  Lewis, Classie Weng C 01/13/2017, 6:46 AM

## 2017-01-13 NOTE — IPOC Note (Signed)
Overall Plan of Care [facility]) Patient Details Name: [name] MRN: [id number] DOB: 1930-03-15  Admitting Diagnosis: L CVA  cognitive issues  Hospital Problems: Active Problems:   Acute arterial ischemic stroke, vertebrobasilar, thalamic, left ([place])     Functional Problem List: Nursing Motor, Safety  PT Balance, Perception, Safety, Sensory  OT Balance, Cognition, Motor, Safety, Sensory  SLP Cognition, Safety, Perception  TR         Basic ADL's: OT Bathing, Dressing, Toileting, Grooming     Advanced  ADL's: OT       Transfers: PT Bed Mobility, Bed to Chair, Car, [employer], Futures trader, Tub/Shower     Locomotion: PT Stairs, Ambulation     Additional Impairments: OT None  SLP Communication, Social Cognition expression Memory, Attention, Awareness  TR      Anticipated Outcomes Item Anticipated Outcome  Self Feeding I  Swallowing      Basic self-care  supervision  Toileting  supervision   Bathroom Transfers supervision  Bowel/Bladder  Pt will remain continent of bowel and bladder during her admission  Transfers  supervision  Locomotion  supervision with LRAD  Communication  Mod I  Cognition  Supervision  Pain  Pt pain level will be less than 5 during her admission  Safety/Judgment  Pt will remain free from falls during her admission   Therapy Plan: PT Intensity: Minimum of 1-2 x/day ,45 to 90 minutes PT Frequency: 5 out of 7 days PT Duration Estimated Length of Stay: 10-12 days OT Intensity: Minimum of 1-2 x/day, 45 to 90 minutes OT Frequency: 5 out of 7 days OT Duration/Estimated Length of Stay: 10-12 days SLP Intensity: Minumum of 1-2 x/day, 30 to 90 minutes SLP Frequency: 3 to 5 out of 7 days SLP Duration/Estimated Length of Stay: 10 to 12 days       Team Interventions: Nursing Interventions Pain Management, Patient/Family Education, Bladder Management, Bowel Management, Medication Management, Psychosocial Support  PT  interventions Ambulation/gait training, Training and development officer, Cognitive remediation/compensation, Community reintegration, Discharge planning, DME/adaptive equipment instruction, Functional mobility training, Neuromuscular re-education, Patient/family education, Pain management, Psychosocial support, Stair training, Therapeutic Activities, Therapeutic Exercise, Visual/perceptual remediation/compensation, UE/LE Coordination activities, UE/LE Strength taining/ROM  OT Interventions Balance/vestibular training, Cognitive remediation/compensation, Discharge planning, DME/adaptive equipment instruction, Functional mobility training, Neuromuscular re-education, Patient/family education, Self Care/advanced ADL retraining, Therapeutic Activities, Therapeutic Exercise, UE/LE Strength taining/ROM, UE/LE Coordination activities  SLP Interventions Cognitive remediation/compensation, Environmental controls, Internal/external aids, Cueing hierarchy, Functional tasks, Patient/family education, Therapeutic Activities  TR Interventions    SW/CM Interventions Discharge Planning, Psychosocial Support, Patient/Family Education    Team Discharge Planning: Destination: PT-Home ,OT- Home , SLP-Home Projected Follow-up: PT-Home health PT, 24 hour supervision/assistance, OT-  Home health OT, SLP-24 hour supervision/assistance Projected Equipment Needs: PT-None recommended by PT, OT- To be determined, SLP-None recommended by SLP Equipment Details: PT- , OT-  Patient/family involved in discharge planning: PT- Patient,  OT-Patient, SLP-Patient  MD ELOS: 7-10d Medical Rehab Prognosis:  Good Assessment:  83 year old right handed female with history of polycythemia, right breast cancer with mastectomy, hypertension, hyperlipidemia, spinal stenosis, TIA 12/16/2016 maintained on aspirin. Per chart review patient lives alone independently with assistive device prior to admission. She does have a niece that stays with her at  times to assist with ADLs. 2 level home bath and bedroom on main level III steps to entry. Acute onset of right-sided weakness and slurred speech and admitted 01/07/2017. CT reviewed, showing acute left thalamus CVA. Per repot, MRI showed acute ischemia within the  left thalamus, in close proximity to the posterior limb of left internal capsule. No hemorrhage or mass effect. Echocardiogram showed diastolic dysfunction, but otherwise normal, MRA showed moderate to severe stenosis, left MCA and M2 branches. Patient did not receive TPA. Was started on dual antiplatelet therapy with aspirin and Plavix for 3 months and then either aspirin or Plavix alone thereafter. LDL was 109, started on Lovenox for DVT prophylaxis   Now requiring 24/7 Rehab RN,MD, as well as CIR level PT, OT and SLP.  Treatment team will focus on ADLs and mobility with goals set at Sup See Team Conference Notes for weekly updates to the plan of care

## 2017-01-14 ENCOUNTER — Inpatient Hospital Stay (HOSPITAL_COMMUNITY): Payer: Medicare Other

## 2017-01-14 ENCOUNTER — Inpatient Hospital Stay (HOSPITAL_COMMUNITY): Payer: Medicare Other | Admitting: Physical Therapy

## 2017-01-14 ENCOUNTER — Inpatient Hospital Stay (HOSPITAL_COMMUNITY): Payer: Medicare Other | Admitting: Occupational Therapy

## 2017-01-14 ENCOUNTER — Inpatient Hospital Stay (HOSPITAL_COMMUNITY): Payer: Medicare Other | Admitting: Speech Pathology

## 2017-01-14 DIAGNOSIS — M25562 Pain in left knee: Secondary | ICD-10-CM

## 2017-01-14 NOTE — Progress Notes (Signed)
Speech Language Pathology Discharge Summary  Patient Details  Name: [name] MRN: [id number] Date of Birth: 20-Jan-1930  Today's Date: 01/14/2017 SLP Individual Time: 0730-0800 SLP Individual Time Calculation (min): 30 min   Skilled Therapeutic Interventions:  Skilled treatment session focused on cognition goals. SLP facilitated session by providing Min A to occasional Mod A multimodal cues for anticipatory awareness. Pt with verbose comments demonstrating baseline cognitive deficits impacting safety such as jumping out of barn with sheet on her back in attempts to fly, climbing on a rolling chair to kill and spider with resultant fall. Pt has not ability to transfer current cues into present or future behaviors. Pt is at extremely high risk for fall and she posing risk to community as she states that she will drive "when she wants." Max education privded but pt requires Min A to occasional Mod A supervision at discharge.      Patient has met 2 of 2 long term goals.  Patient to discharge at [facility] level.   Clinical Impression/Discharge Summary:  Pt has had minimal progress in skilled ST session d/t baseline cognitive deficits impacting her ability to execute safety awareness within current or anticipatory situations. Pt is being discharged from [place] services as she is currently at her baseline level of function and therefore is not able to retain/progress towards increased function.    Care Partner:  Caregiver Able to Provide Assistance: No  Type of Caregiver Assistance: Physical;Cognitive  Recommendation:  24 hour supervision/assistance      Equipment: N/A   Reasons for discharge: Treatment goals met   Patient/Family Agrees with Progress Made and Goals Achieved: Yes   Function:  Eating Eating   Modified Consistency Diet: No Eating Assist Level: No help, No cues           Cognition Comprehension Comprehension assist level: Follows basic conversation/direction  with extra time/assistive device  Expression   Expression assist level: Expresses basic needs/ideas: With no assist  Social Interaction Social Interaction assist level: Interacts appropriately with others with medication or extra time (anti-anxiety, antidepressant).  Problem Solving Problem solving assist level: Solves basic 25 - 49% of the time - needs direction more than half the time to initiate, plan or complete simple activities  Memory Memory assist level: Recognizes or recalls 50 - 74% of the time/requires cueing 25 - 49% of the time   Jerron Niblack 01/14/2017, 7:47 AM

## 2017-01-14 NOTE — Progress Notes (Signed)
Physical Therapy Session Note  Patient Details  Name: [name] MRN: [id number] Date of Birth: 27-May-1930  Today's Date: 01/14/2017 PT Individual Time: 1104-1200 PT Individual Time Calculation (min): 56 min   Short Term Goals: Week 1:  PT Short Term Goal 1 (Week 1): Pt will demonstrate intellectual awareness with min question cues.  PT Short Term Goal 2 (Week 1): Pt will demonstrate safe transfers with rollator by locking brakes without cues in 75% of opportunities. PT Short Term Goal 3 (Week 1): Pt will ambulate with rollator and close supervision at least 50% of the time.   Skilled Therapeutic Interventions/Progress Updates:   Pt received sitting in WC and agreeable to PT. Pt donned shoed sitting EOB with distant supervision assist from PT. Gait to rehab gym with Rollator and supervision assist from PT with min cues for decreased speed and Rollator control in turns. Nustep reciprocal movement training level 5 x 6 minutes with moderate cues for proper speed and ROM. Pt reports significant increase in pain in Bilateral knee without any demonstrating signs of pain. Standing balance on red progressing to blue wedge to build moderately difficult peg board puzzle. Moderate cues from for awareness of mistakes. Supervision assist from PT to maintain Balance.  Gait in hall with cognitive task to find letters and munbers in 1-A, 2-B pattern up to 5-E. Moderate cues for improved visual scanning to R and L as well as improved selective attention to the task.   Patient returned too room and left sitting in [facility] with call bell in reach and all needs met.         Therapy Documentation Precautions:  Precautions Precautions: Fall Precaution Comments: impulsive, poor L awareness Restrictions Weight Bearing Restrictions: No   Pain: 0/10 at rest    See Function Navigator for Current Functional Status.   Therapy/Group: Individual Therapy  Lorie Phenix 01/14/2017, 12:15 PM

## 2017-01-14 NOTE — Progress Notes (Signed)
Subjective/Complaints: Working with SLP no c/os , knee pain better on L with diclofenac gel  ROS neg for CP/SOB, N/V/D  Objective: Vital Signs: Blood pressure (!) 168/70, pulse 67, temperature 97.8 F (36.6 C), temperature source Oral, resp. rate 18, height 5\' 3"  (1.6 m), weight 80.6 kg (177 lb 9.6 oz), SpO2 95 %. Dg Knee 1-2 Views Left  Result Date: 01/12/2017 CLINICAL DATA:  Anterior posterior LEFT knee pain for a few years since a fall, intermittent swelling and difficulty bearing weight, initial encounter EXAM: LEFT KNEE - 1-2 VIEW COMPARISON:  None FINDINGS: Osseous demineralization. Joint spaces preserved. No acute fracture, dislocation, or bone destruction. No knee joint effusion. Tiny patellar spur at quadriceps tendon insertion. IMPRESSION: No significant osseous abnormalities. Electronically Signed   By: Lavonia [name] M.D.   On: 01/12/2017 13:32   No results found for this or any previous visit (from the past 72 hour(s)).   HEENT: normal Cardio: RRR and no murmur Resp: CTA B/L and unlabored GI: BS positive and NT, ND Extremity:  Pulses positive and No Edema Skin:   Intact Neuro: Alert/Oriented, Abnormal Sensory mildly diminished R hemifacial and RUE, 4+/5  Motor on RIght, Abnormal FMC Ataxic/ dec FMC and Other word finding  finding deficits Musc/Skel:  No pain with ROM    Assessment/Plan: 1. Functional deficits secondary to Left thalamic infarct which require 3+ hours per day of interdisciplinary therapy in a comprehensive inpatient rehab setting. Physiatrist is providing close team supervision and 24 hour management of active medical problems listed below. Physiatrist and rehab team continue to assess barriers to discharge/monitor patient progress toward functional and medical goals. FIM: Function - Bathing Position: Shower Body parts bathed by patient: Right arm, Left arm, Chest, Abdomen, Front perineal area, Buttocks, Right upper leg, Left upper leg, Right lower leg,  Left lower leg, Back Assist Level: Touching or steadying assistance(Pt > 75%)  Function- Upper Body Dressing/Undressing What is the patient wearing?: Pull over shirt/dress (Dress) Pull over shirt/dress - Perfomed by patient: Thread/unthread right sleeve, Thread/unthread left sleeve, Put head through opening, Pull shirt over trunk Assist Level: More than reasonable time Function - Lower Body Dressing/Undressing What is the patient wearing?: Underwear, Socks, Shoes (dress) Position: Wheelchair/chair at [employer] - Performed by patient: Thread/unthread right underwear leg, Thread/unthread left underwear leg, Pull underwear up/down Pants- Performed by patient: Thread/unthread right pants leg, Thread/unthread left pants leg, Pull pants up/down Non-skid slipper socks- Performed by patient: Don/doff left sock Non-skid slipper socks- Performed by helper: Don/doff right sock Socks - Performed by patient: Don/doff right sock, Don/doff left sock Shoes - Performed by patient: Don/doff right shoe, Don/doff left shoe, Fasten right TED Hose - Performed by helper:  (Refused) Assist for footwear: Setup Assist for lower body dressing: Supervision or verbal cues  Function - Toileting Toileting steps completed by patient: Adjust clothing prior to toileting, Performs perineal hygiene, Adjust clothing after toileting Toileting Assistive Devices: Grab bar or rail Assist level: No help/no cues  Function - Air cabin crew transfer assistive device: Walker Assist level to toilet: No Help, No cues Assist level from toilet: No Help, No cues  Function - Chair/bed transfer Chair/bed transfer method: Stand pivot Chair/bed transfer assist level: Supervision or verbal cues Chair/bed transfer assistive device: Walker Chair/bed transfer details: Tactile cues for weight shifting, Tactile cues for posture, Tactile cues for placement  Function - Locomotion: Wheelchair Will patient use wheelchair at  discharge?: No Function - Locomotion: Ambulation Assistive device: No device Max distance: 247ft  Assist level: Supervision or verbal cues Assist level: Supervision or verbal cues Assist level: Supervision or verbal cues Assist level: Supervision or verbal cues Assist level: Supervision or verbal cues (with Rollator)  Function - Comprehension Comprehension: Auditory Comprehension assist level: Follows complex conversation/direction with extra time/assistive device  Function - Expression Expression: Verbal Expression assist level: Expresses basic 90% of the time/requires cueing < 10% of the time.  Function - Social Interaction Social Interaction assist level: Interacts appropriately with others with medication or extra time (anti-anxiety, antidepressant).  Function - Problem Solving Problem solving assist level: Solves basic 25 - 49% of the time - needs direction more than half the time to initiate, plan or complete simple activities  Function - Memory Memory assist level: Recognizes or recalls 90% of the time/requires cueing < 10% of the time Patient normally able to recall (first 3 days only): Current season, Location of own room, Staff names and faces, That he or she is in a hospital  Medical Problem List and Plan: 1.  Gait disorder secondary to Left thalamic infarct as well as history of TIA Cont CIR PT, OT- we discussed her D/C date and no driving   2.  DVT Prophylaxis/Anticoagulation: Subcutaneous Lovenox. Monitor platelet counts of any signs of bleeding 3. Pain Management/back pain: Celebrex 200 mg daily as needed, Soma 350 mg daily as needed as prior to admission, Oxycodone immediate release as needed Knee pain improved with Diclofenac gel, Xrays neg except small traction spur off patella 4. Mood: Wellbutrin XL 300 mg daily, Valium 10 mg daily as needed anxiety as prior to admission 5. Neuropsych: This patient is capable of making decisions on her own behalf.Neuropsych eval  appreciated 6. Skin/Wound Care: Routine skin checks 7. Fluids/Electrolytes/Nutrition: Routine I&O with follow-up chemistries 8. Hypertension. Lopressor 12.5 mg twice a day. Monitor with increased mobility, elevated this am monitor Vitals:   01/13/17 1357 01/14/17 0558  BP: (!) 147/47 (!) 168/70  Pulse: 76 67  Resp: 18 18  Temp: 98.2 F (36.8 C) 97.8 F (36.6 C)    9. Hyperlipidemia. Pravachol 10. History of right breast cancer with mastectomy  LOS (Days) 4 A FACE TO FACE EVALUATION WAS PERFORMED  Mardee Clune E 01/14/2017, 7:36 AM

## 2017-01-14 NOTE — Progress Notes (Signed)
Occupational Therapy Session Note  Patient Details  Name: [name] MRN: [id number] Date of Birth: 1930/05/17  Today's Date: 01/14/2017 OT Individual Time: 9458-5929 OT Individual Time Calculation (min): 60 min    Short Term Goals: Week 1:  OT Short Term Goal 1 (Week 1): Pt will demonstrate improved safety awareness with bathroom transfers by needing less than 2 safety cues. OT Short Term Goal 2 (Week 1): Pt will demonstrate improved R side coordination to don R sock and shoe independently. OT Short Term Goal 3 (Week 1): Pt will demonstrate improved balance by standing at the sink with supervision only to complete grooming.    Skilled Therapeutic Interventions/Progress Updates:    Pt seen for OT session focusing on ADL re-training, functional standing balance, safey awareness and functional transfers. Pt in supine upon arrival, nutrition staff present taking mela order. She required increased time and cuing for mental flexibility when ordering meal as what she wanted wasn't offered and assist to recall what options were available. She ambulated thorughout session using rollator, requiring max cuing each time to lock brakes prior to sit <> stand and cuing for management during functional tasks as pt with tendency to leave it behind or not initiate use at all. Grooming tasks completed at sink, pt displayed ability to functionally use B UEs simultanously and complete fine motor tasks with R UE mod I. She required VCs for task though as pt initiated use of picking up toothpaste, however, never actually applied toothbrush to brush prior to brushing teeth. In therapy gym, pt stood on foam wedge with hand held steadying assist while pt presented with safety cards, required to identify safe and unsafe situations, required mod cuing for identification and resolution of safety issues. Discussed with pt and paid caregiver plans for bathing at d/c. Pt desiring to get into garden tub though has  shower stall available. Practiced floor transfer in prep for tub transfer, Completed with min A with VCs for technique. Pt remains impulsive with all mobility and displays poor awareness and anticipation in regards to potential safety risks. In ADL apartment, simulated methods for tub entry/exit, will benefit from cont practice and simulated experience due to pt's poor mental flexibility.  Pt returned to room at end of session, left seated on EOB with all needs in reach. Cont to educate regarding recommendation for at least day time if not 24 hr supervision assist at d/c. Pt currently will be alone from Friday afternoon to Monday morning. Pt becoming more agreeable to finding daytime help though refuses need for night time assist. Will make CSW aware to assist with finding hired help.      Therapy Documentation Precautions:  Precautions Precautions: Fall Precaution Comments: impulsive, poor L awareness Restrictions Weight Bearing Restrictions: No Pain:   No/ denies pain ADL: ADL ADL Comments: refer to functional navigator Other Treatments:    See Function Navigator for Current Functional Status.   Therapy/Group: Individual Therapy  Lewis, Aima Mcwhirt C 01/14/2017, 7:06 AM

## 2017-01-14 NOTE — Progress Notes (Signed)
Occupational Therapy Session Note  Patient Details  Name: [name] MRN: [id number] Date of Birth: [birth year]  Today's Date: 01/14/2017 OT Individual Time: 1500-1530 OT Individual Time Calculation (min): 30 min    Short Term Goals: Week 1:  OT Short Term Goal 1 (Week 1): Pt will demonstrate improved safety awareness with bathroom transfers by needing less than 2 safety cues. OT Short Term Goal 2 (Week 1): Pt will demonstrate improved R side coordination to don R sock and shoe independently. OT Short Term Goal 3 (Week 1): Pt will demonstrate improved balance by standing at the sink with supervision only to complete grooming.    Skilled Therapeutic Interventions/Progress Updates:    1:1. Focus of session on safety awareness with rollator, RUE use in footwear and standing balance. Pt ambulates supervision with rollator with MAX VC for safety awareness/locking breaks/ keeping feet inside walker. Pt transfers onto standard bed with touching A to assist supine>sitting EOB. In gym, pt stands to toss horse shoes placed on R with Vc to scan to find horseshoes and safety awareness. Exited session with pt supine in bed, bed exit alarm on and call light in reach.   Therapy Documentation Precautions:  Precautions Precautions: Fall Precaution Comments: impulsive, poor L awareness Restrictions Weight Bearing Restrictions: No   ADL: ADL ADL Comments: refer to functional navigator  See Function Navigator for Current Functional Status.   Therapy/Group: Individual Therapy  Tonny Branch 01/14/2017, 3:53 PM

## 2017-01-15 ENCOUNTER — Inpatient Hospital Stay (HOSPITAL_COMMUNITY): Payer: Medicare Other | Admitting: Physical Therapy

## 2017-01-15 ENCOUNTER — Inpatient Hospital Stay (HOSPITAL_COMMUNITY): Payer: Medicare Other | Admitting: Occupational Therapy

## 2017-01-15 ENCOUNTER — Inpatient Hospital Stay (HOSPITAL_COMMUNITY): Payer: Medicare Other

## 2017-01-15 DIAGNOSIS — I10 Essential (primary) hypertension: Secondary | ICD-10-CM

## 2017-01-15 DIAGNOSIS — K121 Other forms of stomatitis: Secondary | ICD-10-CM

## 2017-01-15 MED ORDER — MAGIC MOUTHWASH
5.0000 mL | Freq: Four times a day (QID) | ORAL | Status: DC | PRN
  Administered 2017-01-15 – 2017-01-16 (×3): 5 mL via ORAL
  Filled 2017-01-15 (×5): qty 5

## 2017-01-15 NOTE — Progress Notes (Signed)
Occupational Therapy Session Note  Patient Details  Name: [name] MRN: [id number] Date of Birth: 08-Dec-1929  Today's Date: 01/15/2017 OT Individual Time: 1003-1100 OT Individual Time Calculation (min): 57 min    Short Term Goals: Week 1:  OT Short Term Goal 1 (Week 1): Pt will demonstrate improved safety awareness with bathroom transfers by needing less than 2 safety cues. OT Short Term Goal 2 (Week 1): Pt will demonstrate improved R side coordination to don R sock and shoe independently. OT Short Term Goal 3 (Week 1): Pt will demonstrate improved balance by standing at the sink with supervision only to complete grooming.      Skilled Therapeutic Interventions/Progress Updates:    Tx focus on safety awareness, dynamic balance, and functional mobility during BADL tasks.   Pt greeted while supine in bed, agreeable to shower. Pt ambulating with rollator to bathroom with close supervision. Pt abandoning walker when flushing toilet and ambulating without device into shower (with Min A from OT). Pt requiring questioning cues to remove clothing prior to starting shower. Pt bathed with overall supervision, unable to utilize temperature controls without physical assist, she c/o weakness in her hands. Pt then dressed at EOB. Required min cues for scanning to locate necessary items. Close supervision for sit<stands PRN with rollator. Pt refusing Ted hose, but was provided education on benefits/MD orders. Pt ambulated to sink to complete oral care/grooming, and then to bathroom for toileting needs. During all transfers and functional tasks, pt requires max instructional cues on walker safety/proper placement. Pt returned to bed at end of tx.She was repositioned for comfort and left with all needs within reach. Bed alarm activated.    Therapy Documentation Precautions:  Precautions Precautions: Fall Precaution Comments: impulsive, poor L awareness Restrictions Weight Bearing Restrictions:  No   Vital Signs: Therapy Vitals BP: (!) 165/65 Pain: No c/o pain during tx    ADL: ADL ADL Comments: refer to functional navigator    See Function Navigator for Current Functional Status.   Therapy/Group: Individual Therapy  Corayma Cashatt A Nakaila Freeze 01/15/2017, 12:39 PM

## 2017-01-15 NOTE — Progress Notes (Signed)
01/15/17 0844 nursing Patient c/o dizziness after getting out of bed today ; BP 165/55 patient was able to eat breakfast.MD made aware. No new orders. Continued to monitor.

## 2017-01-15 NOTE — Progress Notes (Signed)
Physical Therapy Session Note  Patient Details  Name: [name] MRN: [id number] Date of Birth: 1929-09-09  Today's Date: 01/15/2017 PT Individual Time: 3612-2449 AND 1517-1600 PT Individual Time Calculation (min): 45 min AND 42 min   Short Term Goals: Week 1:  PT Short Term Goal 1 (Week 1): Pt will demonstrate intellectual awareness with min question cues.  PT Short Term Goal 2 (Week 1): Pt will demonstrate safe transfers with rollator by locking brakes without cues in 75% of opportunities. PT Short Term Goal 3 (Week 1): Pt will ambulate with rollator and close supervision at least 50% of the time.   Skilled Therapeutic Interventions/Progress Updates:  Session 1 Pt received supine in bed and agreeable to PT. Supine>sit transfer without  assist or cues, but noted to take extra time. Pt reports dizziness and fatigue at start of session. Pt assessed Vitals. BP 131/61. HR. 72. Temp 98.6. Gait training through hall with Rollator and supervision assist from PT. Pt noted to demonstrate improved cadence and safety with turns on this day. PT instructed pt in building pipe tree x 2 for "t" and Field goal. Pt required moderate questioning cues to break task into parts and awareness of mistakes. Following pipe tree exercise, Pt demonstrated poor awareness of use of rollator and safety awareness requiring min assist to prevent LOB.   PT also instructed pt in Stair negotiation x12 with BUE support on supervision assist for safety.  Patient returned too room and left supine in bed with call bell in reach and all needs met.     Session 2 Pt received supine in bed and agreeable to PT. Supine>sit transfer without assist or cues from PT.  Pt able to done shoes sitting EOB with supervision assist from PT for safety. Gait through hall of rehab unit with supervision assist from PT 155f x2.  PT instructed pt in MMeridianlevel exercise program. Min assist throughout with at least 1 UE support on rail  in hall. Pt returned to room and performed ambulatory transfer to bed with supervision assist. Sit>supine completed without assist and left supine in bed with call bell in reach and all needs met.           Therapy Documentation Precautions:  Precautions Precautions: Fall Precaution Comments: impulsive, poor L awareness Restrictions Weight Bearing Restrictions: No  Vital Signs: Therapy Vitals BP: (!) 165/65 Pain:   0/10   See Function Navigator for Current Functional Status.   Therapy/Group: Individual Therapy  ALorie Phenix5/26/2018, 9:49 AM

## 2017-01-15 NOTE — Progress Notes (Signed)
[name] is a 83 y.o. female [birth year] [id number]  Subjective: C/o biting her cheek on the R when eating. Slept well. Feeling OK.  Objective: Vital signs in last 24 hours: Temp:  [98.1 F (36.7 C)-98.5 F (36.9 C)] 98.1 F (36.7 C) (05/26 0515) Pulse Rate:  [70-79] 79 (05/26 0515) Resp:  [18] 18 (05/26 0515) BP: (136-183)/(52-74) 165/65 (05/26 0843) SpO2:  [94 %-98 %] 98 % (05/26 0515) Weight change:  Last BM Date: 01/14/17  Intake/Output from previous day: 05/25 0701 - 05/26 0700 In: 87 [P.O.:780] Out: -  Last cbgs: CBG (last 3)  No results for input(s): GLUCAP in the last 72 hours.   Physical Exam General: No apparent distress   HEENT: not dry. R inner cheek w/ulceration Lungs: Normal effort. Lungs clear to auscultation, no crackles or wheezes. Cardiovascular: Regular rate and rhythm, no edema Abdomen: S/NT/ND; BS(+) Musculoskeletal:  unchanged Neurological: No new neurological deficits Wounds: N/A    Skin: clear  Aging changes Mental state: Alert, oriented, cooperative    Lab Results: BMET    Component Value Date/Time   NA 138 01/11/2017 0439   K 3.7 01/11/2017 0439   CL 102 01/11/2017 0439   CO2 27 01/11/2017 0439   GLUCOSE 115 (H) 01/11/2017 0439   BUN 19 01/11/2017 0439   CREATININE 0.93 01/11/2017 0439   CALCIUM 8.9 01/11/2017 0439   CALCIUM 9.5 10/18/2012 1259   GFRNONAA 55 (L) 01/11/2017 0439   GFRAA >60 01/11/2017 0439   CBC    Component Value Date/Time   WBC 7.1 01/11/2017 0439   RBC 4.70 01/11/2017 0439   HGB 13.9 01/11/2017 0439   HCT 42.5 01/11/2017 0439   PLT 165 01/11/2017 0439   MCV 90.4 01/11/2017 0439   MCH 29.6 01/11/2017 0439   MCHC 32.7 01/11/2017 0439   RDW 12.5 01/11/2017 0439   LYMPHSABS 3.4 01/11/2017 0439   MONOABS 0.5 01/11/2017 0439   EOSABS 0.2 01/11/2017 0439   BASOSABS 0.0 01/11/2017 0439    Studies/Results: No results found.  Medications: I have reviewed the patient's current  medications.  Assessment/Plan:  1. L CVA - in Rehab. Pravastatin, Plavix, BP meds 2. DVT proph w/Lovenox 3. Depression: Wellbutrin 4. HTN. Lopressor. May need to add another Rx 5. Mouth sores - Magic mouthwash 6. Dyslipidemia - Pravachol     Length of stay, days: 5  Walker Kehr , MD 01/15/2017, 9:53 AM

## 2017-01-15 NOTE — Progress Notes (Signed)
Occupational Therapy Session Note  Patient Details  Name: [name] MRN: [id number] Date of Birth: 1929-11-15  Today's Date: 01/15/2017 OT Individual Time: 1330-1415 OT Individual Time Calculation (min): 45 min    Short Term Goals: Week 1:  OT Short Term Goal 1 (Week 1): Pt will demonstrate improved safety awareness with bathroom transfers by needing less than 2 safety cues. OT Short Term Goal 2 (Week 1): Pt will demonstrate improved R side coordination to don R sock and shoe independently. OT Short Term Goal 3 (Week 1): Pt will demonstrate improved balance by standing at the sink with supervision only to complete grooming.    Skilled Therapeutic Interventions/Progress Updates:    1;1. No report of pain. Pt ambulates throughout session with [phone] with supervision, question cues for safety awareness, and VC/tactile cues for posture. Pt has questions regaurding healing after stroke, and educated pt on neuroplasticity throughout session. Throughout session pt continues to state "my R hand doesn't work" despite using it functionally to thread beads onto string, write, and manipulate coins. Pt completes money management activity with significantly increased time, question cues and cues to write numbers down to help complete simple math and as a memory strategy. Exited session with pt supine in bed, bed exit alarm on and call light in reach.   Therapy Documentation Precautions:  Precautions Precautions: Fall Precaution Comments: impulsive, poor L awareness Restrictions Weight Bearing Restrictions: No  See Function Navigator for Current Functional Status.   Therapy/Group: Individual Therapy  Tonny Branch 01/15/2017, 4:11 PM

## 2017-01-16 DIAGNOSIS — G8929 Other chronic pain: Secondary | ICD-10-CM

## 2017-01-16 LAB — URINALYSIS, ROUTINE W REFLEX MICROSCOPIC
Bacteria, UA: NONE SEEN
Bilirubin Urine: NEGATIVE
GLUCOSE, UA: NEGATIVE mg/dL
Hgb urine dipstick: NEGATIVE
KETONES UR: NEGATIVE mg/dL
Nitrite: NEGATIVE
PROTEIN: NEGATIVE mg/dL
SQUAMOUS EPITHELIAL / LPF: NONE SEEN
Specific Gravity, Urine: 1.009 (ref 1.005–1.030)
pH: 5 (ref 5.0–8.0)

## 2017-01-16 MED ORDER — MAGIC MOUTHWASH
5.0000 mL | Freq: Three times a day (TID) | ORAL | Status: DC
  Administered 2017-01-16 – 2017-01-18 (×9): 5 mL via ORAL
  Filled 2017-01-16 (×11): qty 5

## 2017-01-16 NOTE — Progress Notes (Signed)
Urinalysis collected and sent to lab.

## 2017-01-16 NOTE — Progress Notes (Signed)
[name] is a 83 y.o. female April 17, 1930 [id number]  Subjective: C/o L knee pain - not new. No new problems. Slept well. Feeling OK.  Objective: Vital signs in last 24 hours: Temp:  [98.4 F (36.9 C)-98.7 F (37.1 C)] 98.4 F (36.9 C) (05/27 0610) Pulse Rate:  [72-77] 72 (05/27 0610) Resp:  [16-18] 16 (05/27 0610) BP: (146-172)/(56-81) 146/64 (05/27 0610) SpO2:  [98 %-99 %] 99 % (05/27 0610) Weight change:  Last BM Date: 01/15/17  Intake/Output from previous day: No intake/output data recorded. Last cbgs: CBG (last 3)  No results for input(s): GLUCAP in the last 72 hours.   Physical Exam General: No apparent distress   HEENT: not dry Lungs: Normal effort. Lungs clear to auscultation, no crackles or wheezes. Cardiovascular: Regular rate and rhythm, no edema Abdomen: S/NT/ND; BS(+) Musculoskeletal:  unchanged Neurological: No new neurological deficits Wounds: N/A    Skin: clear  Aging changes Mental state: Alert, oriented, cooperative    Lab Results: BMET    Component Value Date/Time   NA 138 01/11/2017 0439   K 3.7 01/11/2017 0439   CL 102 01/11/2017 0439   CO2 27 01/11/2017 0439   GLUCOSE 115 (H) 01/11/2017 0439   BUN 19 01/11/2017 0439   CREATININE 0.93 01/11/2017 0439   CALCIUM 8.9 01/11/2017 0439   CALCIUM 9.5 10/18/2012 1259   GFRNONAA 55 (L) 01/11/2017 0439   GFRAA >60 01/11/2017 0439   CBC    Component Value Date/Time   WBC 7.1 01/11/2017 0439   RBC 4.70 01/11/2017 0439   HGB 13.9 01/11/2017 0439   HCT 42.5 01/11/2017 0439   PLT 165 01/11/2017 0439   MCV 90.4 01/11/2017 0439   MCH 29.6 01/11/2017 0439   MCHC 32.7 01/11/2017 0439   RDW 12.5 01/11/2017 0439   LYMPHSABS 3.4 01/11/2017 0439   MONOABS 0.5 01/11/2017 0439   EOSABS 0.2 01/11/2017 0439   BASOSABS 0.0 01/11/2017 0439    Studies/Results: No results found.  Medications: I have reviewed the patient's current medications.  Assessment/Plan:  1. L CVA - in CIR. On  Pravastatin, Plavix, BP meds 2. DVT proph - Lovenox 3. Depression - on Wellbutrin 4. HTN. Lopressor 5. Mouth sores - mouthwash helped 6. Chronic L knee pain - OP management   Length of stay, days: 6  Walker Kehr , MD 01/16/2017, 11:08 AM

## 2017-01-16 NOTE — Progress Notes (Signed)
01/16/17 1748 nursing Patient c/o burning when she urinates, c/o mouth pain; white patches noted.MD notified new orders noted.

## 2017-01-17 ENCOUNTER — Inpatient Hospital Stay (HOSPITAL_COMMUNITY): Payer: Medicare Other | Admitting: Occupational Therapy

## 2017-01-17 ENCOUNTER — Inpatient Hospital Stay (HOSPITAL_COMMUNITY): Payer: Medicare Other | Admitting: Physical Therapy

## 2017-01-17 DIAGNOSIS — R300 Dysuria: Secondary | ICD-10-CM

## 2017-01-17 DIAGNOSIS — R42 Dizziness and giddiness: Secondary | ICD-10-CM

## 2017-01-17 LAB — URINALYSIS, ROUTINE W REFLEX MICROSCOPIC
BILIRUBIN URINE: NEGATIVE
Bacteria, UA: NONE SEEN
GLUCOSE, UA: NEGATIVE mg/dL
Hgb urine dipstick: NEGATIVE
KETONES UR: NEGATIVE mg/dL
NITRITE: NEGATIVE
PH: 5 (ref 5.0–8.0)
Protein, ur: NEGATIVE mg/dL
SPECIFIC GRAVITY, URINE: 1.018 (ref 1.005–1.030)

## 2017-01-17 LAB — CREATININE, SERUM
CREATININE: 0.96 mg/dL (ref 0.44–1.00)
GFR calc non Af Amer: 53 mL/min — ABNORMAL LOW (ref >60)

## 2017-01-17 MED ORDER — LIDOCAINE HCL 2 % EX GEL
CUTANEOUS | Status: DC | PRN
  Administered 2017-01-18: 5 via TOPICAL
  Filled 2017-01-17: qty 5

## 2017-01-17 NOTE — Progress Notes (Addendum)
Occupational Therapy Session Note  Patient Details  Name: [name] MRN: [id number] Date of Birth: 09/26/1929  Today's Date: 01/17/2017 OT Individual Time: 0945-1100 OT Individual Time Calculation (min): 75 min    Short Term Goals: Week 1:  OT Short Term Goal 1 (Week 1): Pt will demonstrate improved safety awareness with bathroom transfers by needing less than 2 safety cues. OT Short Term Goal 2 (Week 1): Pt will demonstrate improved R side coordination to don R sock and shoe independently. OT Short Term Goal 3 (Week 1): Pt will demonstrate improved balance by standing at the sink with supervision only to complete grooming.    Skilled Therapeutic Interventions/Progress Updates:    Pt seen for OT ADL bathing/dressing session and session focusing on ambulation and safety awareness with IADL tasks. Pt in supine upon arrival, required increased encouragement for participation. She was unable to recall when pain medicine had been administered as she wanted to let it "kick in" and wait for therapy, however, pt had received pain meds ~1 hour prior.  She voiced pain 3/10 in knees.  Pt ambulated throughout room with rollator, required cuing for initiation of use and safety with use during functional tasks. Se bathed seated on shower chair, cont to demonstrate poor mental clarity, saying she used too much soap in previous day's shower and therefore did not need to use soap for this shower. She dressed in standing, pulling dress overhead, sat to don socks/ shoes. She ambulated with rollator to therapy gym. Completed 9 hole peg test, see results below.  She completed cognitive task while ambulating in hallway, required mod cuing for cognition with decreased ambulation speed noted. In family room, pt watered plants from standing level, demonstrational and VCs for rollator management during functional task. She ambulated back to room where she completed toileting task with supervision. Returned to  supine and left with all needs in reach and bed alarm on.   9 Hole Peg Test R: 47.12,  41.17 and 46.80  AVG: 45.03 L: 54.68, 42.81, and 52.33 AVG: 49.94  Therapy Documentation Precautions:  Precautions Precautions: Fall Precaution Comments: impulsive, poor L awareness Restrictions Weight Bearing Restrictions: No ADL: ADL ADL Comments: refer to functional navigator  See Function Navigator for Current Functional Status.   Therapy/Group: Individual Therapy  Lewis, Alyscia Carmon C 01/17/2017, 7:10 AM

## 2017-01-17 NOTE — Progress Notes (Signed)
Social Work Patient ID: [name], female   DOB: 04-25-1930, 83 y.o.   MRN: [id number]  Have left a message for Teresa-POA to discuss discharge arrangements and her question regarding 24 hr care arrangement. Will await her return call due to pt and she will need to make 24 hr supervision arrangements prior to discharge.

## 2017-01-17 NOTE — Progress Notes (Addendum)
Physical Therapy Note  Patient Details  Name: [name] MRN: [id number] Date of Birth: 1930-01-12 Today's Date: 01/17/2017    Time: 820-915 55 minutes  1:1 Pt c/o 6/10 Rt LE pain, voltaren gel applied, RN made aware and meds given during session.  Pt states she does not feel she can go to the gym and exercise due to pain and fear of falling.  Pt performed seated safety cards task with improving awareness of safety risks, still requiring min cues for how to correct safety hazards.  Seated therex 2 x 10 LAQ, hip flex, hip abd/add and HS curl with orange theraband.  With theraband UE shoulder horizontal abduction and PNF diangonals x 10.  Pt performed mobility with rollator and supervision within room to toilet and sink including toilet transfers and hygiene all with supervision.  Pt given handout of [place] for home use, pt verbalizes understanding.  Pt left in bed with alarm set and needs at hand.    Time 2:  1415-1445 30 minutes  1:1 Pt with no c/o pain, continues to c/o LE weakness bilat but agreeable to attempt out of bed activity.  Pt sits to don socks and shoes with increased time.  Gait with rollator to restroom with supervision, toilet transfers and hygiene with supervision.  Gait to ortho gym with pt requiring min A to due shuffling gait with near LOB due to LE fatigue.  Car transfer to simulated sedan with cuing for safety and sequencing, supervision assist.  Attempt gait to main gym from ortho gym, pt able to gait 50' before feeling like legs are "giving out" and requiring mod A from PT to sit safely on rollator.  Attempt nu step for LE strengthening in seated position for safety, pt only able to perform x 2  Minutes before stating she is too fatigued and not able to continue.  Pt assisted back to room and requires min A for bed mobility due to fatigue.  RN made aware of pts decreased independence with mobility. Arhianna Ebey 01/17/2017, 9:24 AM

## 2017-01-17 NOTE — Discharge Instructions (Signed)
Inpatient Rehab Discharge Instructions  [name] Discharge date and time: No discharge date for patient encounter.   Activities/Precautions/ Functional Status: Activity: activity as tolerated Diet: regular diet Wound Care: none needed Functional status:  ___ No restrictions     ___ Walk up steps independently ___ 24/7 supervision/assistance   ___ Walk up steps with assistance ___ Intermittent supervision/assistance  ___ Bathe/dress independently ___ Walk with walker     _x STROKE/TIA DISCHARGE INSTRUCTIONS SMOKING Cigarette smoking nearly doubles your risk of having a stroke & is the single most alterable risk factor  If you smoke or have smoked in the last 12 months, you are advised to quit smoking for your health.  Most of the excess cardiovascular risk related to smoking disappears within a year of stopping.  Ask you doctor about anti-smoking medications  [place] Quit Line: 1-800-QUIT NOW  Free Smoking Cessation Classes [phone]  CHOLESTEROL Know your levels; limit fat & cholesterol in your diet  Lipid Panel     Component Value Date/Time   CHOL 181 12/17/2016 0459   TRIG 194 (H) 12/17/2016 0459   HDL 33 (L) 12/17/2016 0459   CHOLHDL 5.5 12/17/2016 0459   VLDL 39 12/17/2016 0459   LDLCALC 109 (H) 12/17/2016 0459      Many patients benefit from treatment even if their cholesterol is at goal.  Goal: Total Cholesterol (CHOL) less than 160  Goal:  Triglycerides (TRIG) less than 150  Goal:  HDL greater than 40  Goal:  LDL (LDLCALC) less than 100   BLOOD PRESSURE American Stroke Association blood pressure target is less that 120/80 mm/Hg  Your discharge blood pressure is:  BP: (!) 148/53  Monitor your blood pressure  Limit your salt and alcohol intake  Many individuals will require more than one medication for high blood pressure  DIABETES (A1c is a blood sugar average for last 3 months) Goal HGBA1c is under 7% (HBGA1c is blood sugar average for last 3  months)  Diabetes: No known diagnosis of diabetes    Lab Results  Component Value Date   HGBA1C 6.4 (H) 12/17/2016     Your HGBA1c can be lowered with medications, healthy diet, and exercise.  Check your blood sugar as directed by your physician  Call your physician if you experience unexplained or low blood sugars.  PHYSICAL ACTIVITY/REHABILITATION Goal is 30 minutes at least 4 days per week  Activity: Increase activity slowly, Therapies: Physical Therapy: Home Health Return to work:   Activity decreases your risk of heart attack and stroke and makes your heart stronger.  It helps control your weight and blood pressure; helps you relax and can improve your mood.  Participate in a regular exercise program.  Talk with your doctor about the best form of exercise for you (dancing, walking, swimming, cycling).  DIET/WEIGHT Goal is to maintain a healthy weight  Your discharge diet is: Diet regular Room service appropriate? Yes; Fluid consistency: Thin  liquids Your height is:  Height: 5\' 3"  (160 cm) Your current weight is: Weight: 80.6 kg (177 lb 9.6 oz) Your Body Mass Index (BMI) is:  BMI (Calculated): 31.5  Following the type of diet specifically designed for you will help prevent another stroke.  Your goal weight range is:    Your goal Body Mass Index (BMI) is 19-24.  Healthy food habits can help reduce 3 risk factors for stroke:  High cholesterol, hypertension, and excess weight.  RESOURCES Stroke/Support Group:  Call [facility]  PROVIDED/REVIEWED AND GIVEN TO PATIENT Stroke warning signs and symptoms How to activate emergency medical system (call 911). Medications prescribed at discharge. Need for follow-up after discharge. Personal risk factors for stroke. Pneumonia vaccine given:  Flu vaccine given:  My questions have been answered, the writing is legible, and I understand these instructions.  I will adhere to these goals & educational materials that  have been provided to me after my discharge from the hospital.   __ Bathe/dress with assistance ___ Walk Independently    ___ Shower independently ___ Walk with assistance    ___ Shower with assistance ___ No alcohol     ___ Return to work/school ________  Special Instructions:    COMMUNITY REFERRALS UPON DISCHARGE:    Home Health:   PT, OT, RN    [facility]   Date of last service:01/19/2017  Medical Equipment/Items Ordered:NONE PT ALREADY HAS FROM PREVIOUS ADMITS  Agency/Supplier: [employer] CARE[employer]IVER FOR HER CARE-SUPERVISION RECOMMENDATION  GENERAL COMMUNITY RESOURCES FOR PATIENT/FAMILY: Support Groups:[facility] SUPPORT GROUP (SEPT-MAY) EVERY SECOND Thursday @ 3:00-4:00 PM ON THE REHAB UNIT. QUESTIONS CONTACT [facility] [phone]   My questions have been answered and I understand these instructions. I will adhere to these goals and the provided educational materials after my discharge from the hospital.  Patient/Caregiver Signature _______________________________ Date __________  Clinician Signature _______________________________________ Date __________  Please bring this form and your medication list with you to all your follow-up doctor's appointments.

## 2017-01-17 NOTE — Progress Notes (Signed)
Subjective/Complaints: Up at EOB. States that her vaginal area is "raw" from being rubbed with a towel earlier last week. Feels that it's making her urine burn too. Also states she feels light-headed since getting up this morning.   ROS: pt denies nausea, vomiting, diarrhea, cough, shortness of breath or chest pain  Objective: Vital Signs: Blood pressure (!) 153/54, pulse 78, temperature 98.9 F (37.2 C), temperature source Oral, resp. rate 18, height _0  (1.6 m), weight 80.6 kg (177 lb 9.6 oz), SpO2 96 %. No results found. Results for orders placed or performed during the hospital encounter of 01/10/17 (from the past 72 hour(s))  Urinalysis, Routine w reflex microscopic     Status: Abnormal   Collection Time: 01/16/17  5:59 PM  Result Value Ref Range   Color, Urine STRAW (A) YELLOW   APPearance CLEAR CLEAR   Specific Gravity, Urine 1.009 1.005 - 1.030   pH 5.0 5.0 - 8.0   Glucose, UA NEGATIVE NEGATIVE mg/dL   Hgb urine dipstick NEGATIVE NEGATIVE   Bilirubin Urine NEGATIVE NEGATIVE   Ketones, ur NEGATIVE NEGATIVE mg/dL   Protein, ur NEGATIVE NEGATIVE mg/dL   Nitrite NEGATIVE NEGATIVE   Leukocytes, UA TRACE (A) NEGATIVE   RBC / HPF 0-5 0 - 5 RBC/hpf   WBC, UA 0-5 0 - 5 WBC/hpf   Bacteria, UA NONE SEEN NONE SEEN   Squamous Epithelial / LPF NONE SEEN NONE SEEN  Creatinine, serum     Status: Abnormal   Collection Time: 01/17/17  6:46 AM  Result Value Ref Range   Creatinine, Ser 0.96 0.44 - 1.00 mg/dL   GFR calc non Af Amer 53 (L) >60 mL/min   GFR calc Af Amer >60 >60 mL/min    Comment: (NOTE) The eGFR has been calculated using the CKD EPI equation. This calculation has not been validated in all clinical situations. eGFR's persistently <60 mL/min signify possible Chronic Kidney Disease.      HEENT: normal Cardio: RRR without murmur Resp: CTA B GI: BS positive and NT, ND Extremity:  Pulses positive and No Edema Skin:   Intact Neuro: Alert/Oriented, Abnormal Sensory  mildly diminished R hemifacial and RUE, 4+/5  Motor on RIght, Abnormal FMC Ataxic/ dec FMC and minimal word finding deficits Musc/Skel:  No pain with ROM    Assessment/Plan: 1. Functional deficits secondary to Left thalamic infarct which require 3+ hours per day of interdisciplinary therapy in a comprehensive inpatient rehab setting. Physiatrist is providing close team supervision and 24 hour management of active medical problems listed below. Physiatrist and rehab team continue to assess barriers to discharge/monitor patient progress toward functional and medical goals. FIM: Function - Bathing Bathing activity did not occur: Refused Position: Shower Body parts bathed by patient: Right arm, Left arm, Chest, Abdomen, Front perineal area, Buttocks, Right upper leg, Left upper leg, Right lower leg, Left lower leg, Back Assist Level: Supervision or verbal cues  Function- Upper Body Dressing/Undressing What is the patient wearing?: Pull over shirt/dress (dress) Pull over shirt/dress - Perfomed by patient: Thread/unthread right sleeve, Thread/unthread left sleeve, Put head through opening, Pull shirt over trunk Assist Level: Set up Function - Lower Body Dressing/Undressing What is the patient wearing?: Underwear, Non-skid slipper socks Position: Sitting EOB Underwear - Performed by patient: Thread/unthread right underwear leg, Thread/unthread left underwear leg, Pull underwear up/down Pants- Performed by patient: Thread/unthread right pants leg, Thread/unthread left pants leg, Pull pants up/down Non-skid slipper socks- Performed by patient: Don/doff right sock, Don/doff left sock Non-skid slipper  socks- Performed by helper: Don/doff right sock Socks - Performed by patient: Don/doff right sock, Don/doff left sock Shoes - Performed by patient: Don/doff right shoe, Don/doff left shoe, Fasten right TED Hose - Performed by helper:  (refused) Assist for footwear: Setup Assist for lower body  dressing: Supervision or verbal cues  Function - Toileting Toileting steps completed by patient: Adjust clothing prior to toileting, Performs perineal hygiene, Adjust clothing after toileting Toileting Assistive Devices: Grab bar or rail Assist level: Supervision or verbal cues  Function Midwife transfer assistive device: Grab bar, Walker Assist level to toilet: Supervision or verbal cues Assist level from toilet: Supervision or verbal cues  Function - Chair/bed transfer Chair/bed transfer method: Stand pivot Chair/bed transfer assist level: Supervision or verbal cues Chair/bed transfer assistive device: Walker Chair/bed transfer details: Tactile cues for weight shifting, Tactile cues for posture, Tactile cues for placement  Function - Locomotion: Wheelchair Will patient use wheelchair at discharge?: No Function - Locomotion: Ambulation Assistive device: Walker-rolling Max distance: 143f  Assist level: Supervision or verbal cues Assist level: Supervision or verbal cues Assist level: Supervision or verbal cues Assist level: Supervision or verbal cues Assist level: Supervision or verbal cues (with Rollator)  Function - Comprehension Comprehension: Auditory Comprehension assist level: Follows complex conversation/direction with extra time/assistive device  Function - Expression Expression: Verbal Expression assist level: Expresses basic 90% of the time/requires cueing < 10% of the time.  Function - Social Interaction Social Interaction assist level: Interacts appropriately with others with medication or extra time (anti-anxiety, antidepressant).  Function - Problem Solving Problem solving assist level: Solves basic 25 - 49% of the time - needs direction more than half the time to initiate, plan or complete simple activities  Function - Memory Memory assist level: Recognizes or recalls 90% of the time/requires cueing < 10% of the time Patient normally able  to recall (first 3 days only): Current season, Location of own room, Staff names and faces, That he or she is in a hospital  Medical Problem List and Plan: 1.  Gait disorder secondary to Left thalamic infarct as well as history of TIA Cont CIR PT, OT- we discussed her D/C date and no driving  2.  DVT Prophylaxis/Anticoagulation: Subcutaneous Lovenox. Monitor platelet counts of any signs of bleeding 3. Pain Management/back pain: Celebrex 200 mg daily as needed, Soma 350 mg daily as needed as prior to admission, Oxycodone immediate release as needed Knee pain improved with Diclofenac gel, Xrays neg except small traction spur off patella 4. Mood: Wellbutrin XL 300 mg daily, Valium 10 mg daily as needed anxiety as prior to admission 5. Neuropsych: This patient is capable of making decisions on her own behalf.Neuropsych eval appreciated 6. Skin/Wound Care: Routine skin checks 7. Fluids/Electrolytes/Nutrition: good po intake it appears.   -recheck bmet tomorrow 8. Hypertension. Lopressor 12.5 mg twice a day. Monitor with increased mobility, elevated this am monitor Vitals:   01/17/17 0534 01/17/17 0806  BP: (!) 151/61 (!) 153/54  Pulse: 80 78  Resp: 18 18  Temp: 98.9 F (37.2 C)     9. Hyperlipidemia. Pravachol 10. History of right breast cancer with mastectomy 11. Vaginal irritation/dysuria. Check ua,ucx  -lidocaine jelly 12. Dizziness??--no vestibular signs on exam. bp ok.  -encourage fluids  -check bmet tomorrow  LOS (Days) 7 A FACE TO FACE EVALUATION WAS PERFORMED  SWARTZ,ZACHARY T 01/17/2017, 9:10 AM

## 2017-01-18 ENCOUNTER — Inpatient Hospital Stay (HOSPITAL_COMMUNITY): Payer: Medicare Other | Admitting: Occupational Therapy

## 2017-01-18 ENCOUNTER — Inpatient Hospital Stay (HOSPITAL_COMMUNITY): Payer: Medicare Other | Admitting: Physical Therapy

## 2017-01-18 LAB — URINE CULTURE: Culture: <10000 — AB

## 2017-01-18 MED ORDER — POLYETHYLENE GLYCOL 3350 17 G PO PACK: 17.0000 g | PACK | Freq: Every day | ORAL | 0 refills | Status: DC

## 2017-01-18 MED ORDER — VITAMIN D3 25 MCG (1000 UT) PO CAPS
1000.0000 [IU] | ORAL_CAPSULE | Freq: Every day | ORAL | 0 refills | Status: DC
Start: 2017-01-18 — End: 2020-12-16

## 2017-01-18 MED ORDER — CARISOPRODOL 350 MG PO TABS: 350.0000 mg | ORAL_TABLET | Freq: Every day | ORAL | 0 refills | Status: DC | PRN

## 2017-01-18 MED ORDER — DIAZEPAM 5 MG PO TABS: 5.0000 mg | ORAL_TABLET | Freq: Every day | ORAL | 0 refills | Status: DC | PRN

## 2017-01-18 MED ORDER — METOPROLOL TARTRATE 25 MG PO TABS
25.0000 mg | ORAL_TABLET | Freq: Two times a day (BID) | ORAL | Status: DC
  Administered 2017-01-18: 25 mg via ORAL
  Filled 2017-01-18: qty 1

## 2017-01-18 MED ORDER — DICLOFENAC SODIUM 1 % TD GEL: 2.0000 g | Freq: Four times a day (QID) | TRANSDERMAL | 0 refills | Status: DC

## 2017-01-18 MED ORDER — BUPROPION HCL ER (XL) 300 MG PO TB24: 300.0000 mg | ORAL_TABLET | Freq: Every day | ORAL | 1 refills | Status: DC

## 2017-01-18 MED ORDER — CELECOXIB 200 MG PO CAPS: 200.0000 mg | ORAL_CAPSULE | Freq: Every day | ORAL | 0 refills | Status: DC | PRN

## 2017-01-18 MED ORDER — METOPROLOL TARTRATE 25 MG PO TABS: 25.0000 mg | ORAL_TABLET | Freq: Two times a day (BID) | ORAL | 1 refills | Status: DC

## 2017-01-18 MED ORDER — OXYCODONE HCL 5 MG PO TABS: 5.0000 mg | ORAL_TABLET | Freq: Every day | ORAL | 0 refills | Status: DC | PRN

## 2017-01-18 MED ORDER — PRAVASTATIN SODIUM 40 MG PO TABS: 40.0000 mg | ORAL_TABLET | Freq: Every day | ORAL | 0 refills | Status: DC

## 2017-01-18 MED ORDER — CLOPIDOGREL BISULFATE 75 MG PO TABS
75.0000 mg | ORAL_TABLET | Freq: Every day | ORAL | 1 refills | Status: DC
Start: 2017-01-19 — End: 2019-07-17

## 2017-01-18 MED ORDER — HYDROXYZINE PAMOATE 25 MG PO CAPS: 25.0000 mg | ORAL_CAPSULE | Freq: Every day | ORAL | 0 refills | Status: DC

## 2017-01-18 NOTE — Progress Notes (Signed)
Social Work Patient ID: [name], female   DOB: [birth year], 83 y.o.   MRN: [id number] Pt is set on going home today since a therapist told she could. Shirley-caregiver is here and has gone through therapies with Her. Pt does need a weekend person to be with her which her POA-Teresa is arranging. MD feels if pt wants to go home she can and is medically stable to. Will contact Helene Kelp and make arrangements for discharge.

## 2017-01-18 NOTE — Progress Notes (Addendum)
Subjective/Complaints: RN reports that she was fixated on leaving today. Pt did not mention to me. Still perseverates on someone rubbing her vaginal area with a coarse towel or paper---area still raw---did not receive lidocaine jelly yesterday. Had questions about numnbess in hand.   ROS: pt denies nausea, vomiting, diarrhea, cough, shortness of breath or chest pain   Objective: Vital Signs: Blood pressure (!) 168/70, pulse 94, temperature 97.7 F (36.5 C), temperature source Oral, resp. rate 20, height 5' 3"  (1.6 m), weight 80.6 kg (177 lb 9.6 oz), SpO2 94 %. No results found. Results for orders placed or performed during the hospital encounter of 01/10/17 (from the past 72 hour(s))  Urinalysis, Routine w reflex microscopic     Status: Abnormal   Collection Time: 01/16/17  5:59 PM  Result Value Ref Range   Color, Urine STRAW (A) YELLOW   APPearance CLEAR CLEAR   Specific Gravity, Urine 1.009 1.005 - 1.030   pH 5.0 5.0 - 8.0   Glucose, UA NEGATIVE NEGATIVE mg/dL   Hgb urine dipstick NEGATIVE NEGATIVE   Bilirubin Urine NEGATIVE NEGATIVE   Ketones, ur NEGATIVE NEGATIVE mg/dL   Protein, ur NEGATIVE NEGATIVE mg/dL   Nitrite NEGATIVE NEGATIVE   Leukocytes, UA TRACE (A) NEGATIVE   RBC / HPF 0-5 0 - 5 RBC/hpf   WBC, UA 0-5 0 - 5 WBC/hpf   Bacteria, UA NONE SEEN NONE SEEN   Squamous Epithelial / LPF NONE SEEN NONE SEEN  Creatinine, serum     Status: Abnormal   Collection Time: 01/17/17  6:46 AM  Result Value Ref Range   Creatinine, Ser 0.96 0.44 - 1.00 mg/dL   GFR calc non Af Amer 53 (L) >60 mL/min   GFR calc Af Amer >60 >60 mL/min    Comment: (NOTE) The eGFR has been calculated using the CKD EPI equation. This calculation has not been validated in all clinical situations. eGFR's persistently <60 mL/min signify possible Chronic Kidney Disease.   Urinalysis, Routine w reflex microscopic     Status: Abnormal   Collection Time: 01/17/17 12:59 PM  Result Value Ref Range   Color,  Urine YELLOW YELLOW   APPearance CLEAR CLEAR   Specific Gravity, Urine 1.018 1.005 - 1.030   pH 5.0 5.0 - 8.0   Glucose, UA NEGATIVE NEGATIVE mg/dL   Hgb urine dipstick NEGATIVE NEGATIVE   Bilirubin Urine NEGATIVE NEGATIVE   Ketones, ur NEGATIVE NEGATIVE mg/dL   Protein, ur NEGATIVE NEGATIVE mg/dL   Nitrite NEGATIVE NEGATIVE   Leukocytes, UA SMALL (A) NEGATIVE   RBC / HPF 0-5 0 - 5 RBC/hpf   WBC, UA 6-30 0 - 5 WBC/hpf   Bacteria, UA NONE SEEN NONE SEEN   Squamous Epithelial / LPF 0-5 (A) NONE SEEN     HEENT: normal Cardio: RRR Resp: CTA B GI: NT/ND Extremity:  Pulses positive and No Edema Skin:   Intact Neuro: Alert/Oriented, Abnormal Sensory mildly diminished R  and RUE, facial sensation better, 4+/5  Motor on RIght, Abnormal FMC Ataxic/ dec FMC. STM deficits? Musc/Skel:  No pain with ROM    Assessment/Plan: 1. Functional deficits secondary to Left thalamic infarct which require 3+ hours per day of interdisciplinary therapy in a comprehensive inpatient rehab setting. Physiatrist is providing close team supervision and 24 hour management of active medical problems listed below. Physiatrist and rehab team continue to assess barriers to discharge/monitor patient progress toward functional and medical goals. FIM: Function - Bathing Bathing activity did not occur: Refused Position: Research scientist (life sciences)  parts bathed by patient: Right arm, Left arm, Chest, Abdomen, Front perineal area, Buttocks, Right upper leg, Left upper leg, Right lower leg, Left lower leg, Back Assist Level: Supervision or verbal cues  Function- Upper Body Dressing/Undressing What is the patient wearing?: Pull over shirt/dress (Dress) Pull over shirt/dress - Perfomed by patient: Thread/unthread right sleeve, Thread/unthread left sleeve, Put head through opening, Pull shirt over trunk Assist Level: Supervision or verbal cues Function - Lower Body Dressing/Undressing What is the patient wearing?: Underwear, Socks,  Shoes Position: Sitting EOB Underwear - Performed by patient: Thread/unthread right underwear leg, Thread/unthread left underwear leg, Pull underwear up/down Pants- Performed by patient: Thread/unthread right pants leg, Thread/unthread left pants leg, Pull pants up/down Non-skid slipper socks- Performed by patient: Don/doff right sock, Don/doff left sock Non-skid slipper socks- Performed by helper: Don/doff right sock Socks - Performed by patient: Don/doff right sock, Don/doff left sock Shoes - Performed by patient: Don/doff right shoe, Don/doff left shoe, Fasten right TED Hose - Performed by helper:  (refused) Assist for footwear: Independent Assist for lower body dressing: Supervision or verbal cues  Function - Toileting Toileting steps completed by patient: Adjust clothing prior to toileting, Performs perineal hygiene, Adjust clothing after toileting Toileting Assistive Devices: Grab bar or rail Assist level: Supervision or verbal cues  Function Midwife transfer assistive device: Grab bar (Rollator) Assist level to toilet: Supervision or verbal cues Assist level from toilet: Supervision or verbal cues  Function - Chair/bed transfer Chair/bed transfer method: Stand pivot Chair/bed transfer assist level: Supervision or verbal cues Chair/bed transfer assistive device: Walker Chair/bed transfer details: Tactile cues for weight shifting, Tactile cues for posture, Tactile cues for placement  Function - Locomotion: Wheelchair Will patient use wheelchair at discharge?: No Function - Locomotion: Ambulation Assistive device: Walker-rolling Max distance: 150 ft Assist level: Supervision or verbal cues Assist level: Supervision or verbal cues Assist level: Supervision or verbal cues Assist level: Supervision or verbal cues Assist level: Supervision or verbal cues (with Rollator)  Function - Comprehension Comprehension: Auditory Comprehension assist level: Follows  complex conversation/direction with extra time/assistive device  Function - Expression Expression: Verbal Expression assist level: Expresses basic 90% of the time/requires cueing < 10% of the time.  Function - Social Interaction Social Interaction assist level: Interacts appropriately with others with medication or extra time (anti-anxiety, antidepressant).  Function - Problem Solving Problem solving assist level: Solves basic 25 - 49% of the time - needs direction more than half the time to initiate, plan or complete simple activities  Function - Memory Memory assist level: Recognizes or recalls 90% of the time/requires cueing < 10% of the time Patient normally able to recall (first 3 days only): Current season, Location of own room, Staff names and faces, That he or she is in a hospital  Medical Problem List and Plan: 1.  Gait disorder secondary to Left thalamic infarct as well as history of TIA Cont CIR PT, OT- scheduled for dc tomorrow  -could go after therapies today if it becomes an issue 2.  DVT Prophylaxis/Anticoagulation: Subcutaneous Lovenox. Monitor platelet counts of any signs of bleeding 3. Pain Management/back pain: Celebrex 200 mg daily as needed, Soma 350 mg daily as needed as prior to admission, Oxycodone immediate release as needed Knee pain improved with Diclofenac gel, Xrays neg except small traction spur off patella 4. Mood: Wellbutrin XL 300 mg daily, Valium 10 mg daily as needed anxiety as prior to admission 5. Neuropsych: This patient is capable of making  decisions on her own behalf.Neuropsych eval appreciated 6. Skin/Wound Care: Routine skin checks 7. Fluids/Electrolytes/Nutrition: good po intake it appears.   -recheck bmet pending 8. Hypertension. Lopressor 12.5 mg twice a day---increase to 39m bid for better control Vitals:   01/17/17 2007 01/18/17 0400  BP: (!) 172/68 (!) 168/70  Pulse:  94  Resp:  20  Temp:  97.7 F (36.5 C)    9. Hyperlipidemia.  Pravachol 10. History of right breast cancer with mastectomy 11. Vaginal irritation/dysuria. ua equivocal, ucx pending  -lidocaine jelly 12. Dizziness??--no vestibular signs on exam. bp ok. --denied sx today  -encourage fluids  -bmet pending today  LOS (Days) 8 A FACE TO FACE EVALUATION WAS PERFORMED  SWARTZ,ZACHARY T 01/18/2017, 8:36 AM

## 2017-01-18 NOTE — Progress Notes (Signed)
Physical Therapy Session Note  Patient Details  Name: [name] MRN: [id number] Date of Birth: Feb 01, 1930  Today's Date: 01/18/2017 PT Individual Time: 0165-5374 PT Individual Time Calculation (min): 30 min   Short Term Goals: Week 1:  PT Short Term Goal 1 (Week 1): Pt will demonstrate intellectual awareness with min question cues.  PT Short Term Goal 2 (Week 1): Pt will demonstrate safe transfers with rollator by locking brakes without cues in 75% of opportunities. PT Short Term Goal 3 (Week 1): Pt will ambulate with rollator and close supervision at least 50% of the time.   Skilled Therapeutic Interventions/Progress Updates: Pt presented sitting at EOB agreeable to therapy. Pt ambulated to rehab gym with rollator and supervision. Pt advised to decreased speed when ambulating for safety. Pt instructed in [place] A exercises all performed x 10, tandem stance x 2 with instruction to use wall/counter. Pt required mod cues for technique and required breaks due to L knee soreness (no numerical assess given). Pt ambulated back to room in same manner as prior. Upon returning back to room pt with near LOB requiring PTA assistance for recovery. PTA continued pt edu with CNA present on increased safety awareness and advising to decrease speed with activities. Pt remained sitting at EOB with CNA present and current needs met.      Therapy Documentation Precautions:  Precautions Precautions: Fall Precaution Comments: Impulsive, very poor safety awareness Restrictions Weight Bearing Restrictions: No General:   Vital Signs: Therapy Vitals Temp: 98.3 F (36.8 C) Temp Source: Oral Pulse Rate: 81 Resp: 20 BP: (!) 146/56 Patient Position (if appropriate): Sitting Oxygen Therapy SpO2: 95 % O2 Device: Not Delivered   See Function Navigator for Current Functional Status.   Therapy/Group: Individual Therapy  Kaili Castille  Yentl Verge, PTA  01/18/2017, 4:05 PM

## 2017-01-18 NOTE — Progress Notes (Signed)
Physical Therapy Discharge Summary  Patient Details  Name: [name] MRN: [id number] Date of Birth: [birth year]  Today's Date: 01/18/2017      Patient has met 14 of 14 long term goals due to improved activity tolerance, improved balance, increased strength and improved coordination.  Patient to discharge at an ambulatory level Supervision.   Patient's care partner is independent to provide the necessary physical and cognitive assistance at discharge.  Reasons goals not met: N/A  Recommendation:  Patient will benefit from ongoing skilled PT services in home health setting to continue to advance safe functional mobility, address ongoing impairments in balance, safety, strength, gait, RLE and RUE coordination, and minimize fall risk.  Equipment: No equipment provided  Reasons for discharge: treatment goals met  Patient/family agrees with progress made and goals achieved: Yes  PT Discharge Pain Pain Assessment Pain Score: 0-No pain  Cognition Orientation Level: Oriented to person;Oriented to place;Oriented to situation;Disoriented to time Sensation   WFL Motor  Motor Motor: Hemiplegia  Mobility Bed Mobility Bed Mobility: Sit to Supine;Supine to Sit Supine to Sit: 5: Supervision Sit to Supine: 5: Supervision Transfers Transfers: Yes Sit to Stand: 5: Supervision Stand to Sit: 5: Supervision Stand Pivot Transfers: 5: Supervision Locomotion  Ambulation Ambulation: Yes Ambulation/Gait Assistance: 5: Supervision Ambulation Distance (Feet): 150 Feet Assistive device: Rolling walker Ambulation/Gait Assistance Details: Verbal cues for precautions/safety;Verbal cues for safe use of DME/AE Gait Gait: Yes Gait Pattern: Impaired Gait Pattern: Trunk flexed Stairs / Additional Locomotion Stairs: Yes Stairs Assistance: 5: Supervision Stair Management Technique: Two rails Number of Stairs: 12 Height of Stairs: 6  Trunk/Postural Assessment  Cervical  Assessment Cervical Assessment: Within Functional Limits Thoracic Assessment Thoracic Assessment: Within Functional Limits Lumbar Assessment Lumbar Assessment: Within Functional Limits Postural Control Postural Control: Deficits on evaluation  Balance Standardized Balance Assessment Standardized Balance Assessment: Berg Balance Test Berg Balance Test Sit to Stand: Able to stand without using hands and stabilize independently Standing Unsupported: Able to stand safely 2 minutes Sitting with Back Unsupported but Feet Supported on Floor or Stool: Able to sit safely and securely 2 minutes Stand to Sit: Controls descent by using hands Transfers: Able to transfer safely, definite need of hands Standing Unsupported with Eyes Closed: Able to stand 10 seconds with supervision Standing Ubsupported with Feet Together: Able to place feet together independently and stand for 1 minute with supervision From Standing, Reach Forward with Outstretched Arm: Can reach forward >12 cm safely (5") From Standing Position, Pick up Object from Floor: Able to pick up shoe safely and easily From Standing Position, Turn to Look Behind Over each Shoulder: Looks behind from both sides and weight shifts well Turn 360 Degrees: Able to turn 360 degrees safely but slowly Standing Unsupported, Alternately Place Feet on Step/Stool: Able to stand independently and complete 8 steps >20 seconds Standing Unsupported, One Foot in Front: Able to plae foot ahead of the other independently and hold 30 seconds Standing on One Leg: Tries to lift leg/unable to hold 3 seconds but remains standing independently Total Score: 44 Dynamic Standing Balance Dynamic Standing - Balance Support: Bilateral upper extremity supported Dynamic Standing - Level of Assistance: 5: Stand by assistance Extremity Assessment      RLE Assessment RLE Assessment: Exceptions to [facility] RLE Strength Right Hip Flexion: 4/5 Right Knee Flexion: 4+/5 Right Knee  Extension: 4+/5 Right Ankle Dorsiflexion: 4/5 Right Ankle Plantar Flexion: 4/5 LLE Assessment LLE Assessment: Exceptions to WFL LLE AROM (degrees) LLE Overall AROM Comments: WFL assessed in  sitting LLE Strength Left Hip Flexion: 4-/5 Left Knee Flexion: 4+/5 Left Knee Extension: 4+/5 Left Ankle Dorsiflexion: 4+/5 Left Ankle Plantar Flexion: 4+/5   See Function Navigator for Current Functional Status.  Rosita DeChalus 01/18/2017, 12:16 PM

## 2017-01-18 NOTE — Progress Notes (Signed)
Occupational Therapy Session Note  Patient Details  Name: [name] MRN: [id number] Date of Birth: 08-17-1930  Today's Date: 01/18/2017 OT Individual Time: 1100-1200 OT Individual Time Calculation (min): 60 min    Short Term Goals: Week 1:  OT Short Term Goal 1 (Week 1): Pt will demonstrate improved safety awareness with bathroom transfers by needing less than 2 safety cues. OT Short Term Goal 2 (Week 1): Pt will demonstrate improved R side coordination to don R sock and shoe independently. OT Short Term Goal 3 (Week 1): Pt will demonstrate improved balance by standing at the sink with supervision only to complete grooming.    Skilled Therapeutic Interventions/Progress Updates:    Pt seen for OT ADL bathing/dressing session and focusing on functional standing balance and cognition. Pt in supine upon arrival, agreeable to tx session. She ambulated throughout session using rollator, requiring frequent VCs for safe management techniques and to lock brakes on Rollator. She bathed seated on tub transfer bench, standing with grab bars to complete pericare/ buttock hygiene. She required steadying assist when attempting to stand on one foot in order to dry LEs following shower. Again recommend to pt to sit to complete higher level balance tasks, pt demonstrates poor carry over of education/ recommendations btwn sessions and with decreased safety awareness/ awareness of deficits.  She dressed seated EOB, requiring increased time for correct clothing orientation. She ambulated to therapy gym, completed fine motor tasks standing on foam wedge. Initiatially required mod A for standing balance on wedge, however, progressed to supervision following pt's awareness of need for anterior weight shift. She demonstrates ability to use B UEs at dominant level despite impaired sensation in R finger tips. She required increased time and max cuing to complete simple pipe tree activity in standing. Completed  second trial of pipe tree in sitting and able to complete with increased time only. Pt ambulated back to room at end of session, left seated EOB in prep for lunch. Pt's day time caregiver present at end of session and cont to recommend to pt and caregiver pt's need for 24 he supervision assist at d/c due to poor safety awareness, and impulsivity, they both acknowledged therapist's recommendation, however, did not follow up with confirmation.   Therapy Documentation Precautions:  Precautions Precautions: Fall Precaution Comments: Impulsive, very poor safety awareness Restrictions Weight Bearing Restrictions: (P) No Pain:   No/ denies pain ADL: ADL ADL Comments: refer to functional navigator  See Function Navigator for Current Functional Status.   Therapy/Group: Individual Therapy  Lewis, Alixander Rallis C 01/18/2017, 7:13 AM

## 2017-01-18 NOTE — Progress Notes (Signed)
Social Work  Discharge Note  The overall goal for the admission was met for:   Discharge location: [place] WHO IS WITH HER 6 HRS M-F NEEDS WEEKEND COVERAGE  Length of Stay: Yes-8 DAYS  Discharge activity level: Yes-SUPERVISION WITH CUEING  Home/community participation: Yes  Services provided included: MD, RD, PT, OT, SLP, RN, CM, Pharmacy, Neuropsych and SW  Financial Services: Medicare and Private Insurance: [place]  Follow-up services arranged: Home Health: [facility]-PT,OT,RN and Patient/Family has no preference for HH/DME agencies  Comments (or additional information):SHIRLEY HAS BEEN HERE AND OBSERVED PT IN THERAPIES, TEAM RECOMMENDS 24 HR SUPERVISION DUE TO PT'S MEMORY AND HIGH RISK TO FALL WHEN AMBULATING. PT FEELS SHE WILL BE BETTER AT HOME AND HAS HER OWN WAY OF DOING THINGS. HAS LIFE ALERT SHE WEARS ON HER WRIST.   Patient/Family verbalized understanding of follow-up arrangements: Yes  Individual responsible for coordination of the follow-up plan: [place]  Confirmed correct DME delivered: Elease Hashimoto 01/18/2017    Elease Hashimoto

## 2017-01-18 NOTE — Progress Notes (Addendum)
Occupational Therapy Discharge Summary  Patient Details  Name: [name] MRN: [id number] Date of Birth: 08-Sep-1929  Patient has met 10 of 10 long term goals due to improved activity tolerance, improved balance, postural control, functional use of  RIGHT upper extremity, improved attention, improved awareness and improved coordination.  Patient to discharge at overall Supervision level. PAtient has hired CNA to be with her Monday- Friday during the day. Therapy is recommending pt have 24 hour supervision assist at d/[name] due to pt's very poor safety awareness. Pt, POA, and pt's hired care giver are all aware of recommendation for 24 hr supervision and pt's very high fall risk as well as for general safety due to pt's cognitive impairments.  Recommendation:  Patient will benefit from ongoing skilled OT services in home health setting to continue to advance functional skills in the area of BADL, iADL and Reduce care partner burden.  Equipment: No equipment provided; pt has built in shower seat  Reasons for discharge: treatment goals met and discharge from hospital  Patient/family agrees with progress made and goals achieved: Yes  OT Discharge Precautions/Restrictions  Precautions Precautions: Fall Precaution Comments: Impulsive, very poor safety awareness Restrictions Weight Bearing Restrictions: No ADL ADL ADL Comments: refer to functional navigator Vision Baseline Vision/History: Wears glasses Wears Glasses: At all times Patient Visual Report: No change from baseline Vision Assessment?: No apparent visual deficits Perception  Perception: Within Functional Limits Praxis Praxis: Intact Cognition Overall Cognitive Status: History of cognitive impairments - at baseline Arousal/Alertness: Awake/alert Orientation Level: Oriented X4 Attention: Sustained Sustained Attention: Appears intact Selective Attention: Impaired Selective Attention Impairment: Verbal complex;Functional  complex Memory: Impaired Memory Impairment: Retrieval deficit;Decreased recall of new information;Decreased short term memory Decreased Short Term Memory: Verbal basic;Functional basic Awareness: Impaired Awareness Impairment: Intellectual impairment Problem Solving: Impaired Problem Solving Impairment: Verbal basic;Functional basic Behaviors: Impulsive;Perseveration Safety/Judgment: Impaired Comments: Pt with very poor safety awareness, awareness of deficits and with impulsivity Sensation Sensation Light Touch: Impaired Detail Light Touch Impaired Details: Impaired RUE (Fingertips) Stereognosis: Impaired by gross assessment Hot/Cold: Appears Intact Proprioception: Appears Intact Coordination Gross Motor Movements are Fluid and Coordinated: No Fine Motor Movements are Fluid and Coordinated: Yes Coordination and Movement Description: WFL Finger Nose Finger Test: [facility] 9 Hole Peg Test: R: 47.12,  41.17 and 46.80  AVG: 45.03    L: L: 54.68, 42.81, and 52.33 AVG: 49.94 Motor  Motor Motor: Hemiplegia  Trunk/Postural Assessment  Cervical Assessment Cervical Assessment: Within Functional Limits Thoracic Assessment Thoracic Assessment: Within Functional Limits Lumbar Assessment Lumbar Assessment: Within Functional Limits Postural Control Postural Control: Deficits on evaluation Postural Limitations: decreased  Balance Balance Balance Assessed: Yes Berg Balance Test Standing Unsupported with Eyes Closed: Able to stand 10 seconds with supervision Dynamic Sitting Balance Dynamic Sitting - Level of Assistance: 6: Modified independent (Device/Increase time) Static Standing Balance Static Standing - Balance Support: During functional activity Static Standing - Level of Assistance: 6: Modified independent (Device/Increase time);5: Stand by assistance Dynamic Standing Balance Dynamic Standing - Balance Support: During functional activity Dynamic Standing - Level of Assistance: 5: Stand  by assistance Extremity/Trunk Assessment RUE Assessment RUE Assessment: Within Functional Limits (Impaired sensation) LUE Assessment LUE Assessment: Within Functional Limits   See Function Navigator for Current Functional Status.  [name], [name] 01/18/2017, 12:53 PM

## 2017-01-18 NOTE — Progress Notes (Signed)
Pt d/c home with personal belongings.  D/C instructions provided by Silvestre Mesi, PA-C with verbalization and understanding of instructions.

## 2017-01-18 NOTE — Discharge Summary (Signed)
NAMEZAFIRAH, [name] NO.:  [id number]  MEDICAL RECORD NO.:  [id number]  LOCATION:  4M04C                        FACILITY:  [facility]  PHYSICIAN:  Charlett Blake, M.D.DATE OF BIRTH:  Jul 24, 1930  DATE OF ADMISSION:  01/10/2017 DATE OF DISCHARGE:  01/18/2017                              DISCHARGE SUMMARY   DISCHARGE DIAGNOSES: 1. Left thalamic infarction. 2. Subcutaneous Lovenox for DVT prophylaxis, pain management,     depression, hypertension, hyperlipidemia, history of breast cancer     with mastectomy, and dizziness, suspect vestibular.  HISTORY OF PRESENT ILLNESS:  This is an 83 year old right-handed female history of polycythemia, right breast cancer, hypertension, hyperlipidemia, and TIA, maintained on aspirin, who lives alone, independent with assistive device prior to admission.  She does of a niece close by.  Presented on Jan 07, 2017, with acute right-sided weakness and slurred speech.  CT showed acute left thalamus CVA.  MRI, acute ischemia within the left thalamus, no hemorrhage or mass effect. Echocardiogram with diastolic dysfunction, but otherwise normal MRA with moderate to severe stenosis, left MCA M2 branches.  The patient did not receive tPA, started on dual anti-platelet therapy, aspirin and Plavix. Subcutaneous Lovenox for DVT prophylaxis.  The patient was admitted for comprehensive rehab program.  PAST MEDICAL HISTORY:  See discharge diagnoses.  SOCIAL HISTORY:  Lives alone, independent with assistive device prior to admission.  FUNCTIONAL STATUS UPON ADMISSION TO REHAB SERVICES:  Min to mod assist 110 feet, one-person handheld assistance, minimal assist sit to stand, min to mod assist activities of daily living.  PHYSICAL EXAMINATION:  VITAL SIGNS:  Blood pressure 174/71, pulse 88, temperature 98, and respirations 17. GENERAL:  Alert female.  Mood was flat, but engaging. HEENT:  EOMs intact. NECK:  Supple and nontender.  No  JVD. CARDIAC:  Regular rate and rhythm.  No murmur. ABDOMEN:  Soft and nontender.  Good bowel sounds. LUNGS:  Clear to auscultation without wheeze.  REHABILITATION HOSPITAL COURSE:  The patient was admitted to Inpatient Rehab Services with therapies initiated on a 3-hour daily basis consisting of physical therapy, occupational therapy, and rehabilitation nursing.  The following issues were addressed during the patient's rehabilitation stay.  Pertaining to [name]' left thalamic infarction remained stable and maintained on aspirin and Plavix therapy, she would follow up with Neurology Services.  Subcutaneous Lovenox for DVT prophylaxis.  Chronic back pain with the use of Celebrex, Soma as needed, oxycodone immediate release as needed.  She continued on Wellbutrin and Valium for history of anxiety and depression.  Emotional support provided.  She was participating fully with her therapies. Blood pressures controlled with low-dose Lopressor.  Pravachol for hyperlipidemia.  She did have a history of right breast cancer with mastectomy in the past.  The patient received weekly collaborative interdisciplinary team conferences to discuss estimated length of stay, family teaching, any barriers to her discharge.  Ambulating 50 feet to the orthopedic gym, requiring some assistance, bouts of dizziness, monitoring for any vestibular changes, gait training ongoing in the hallway with assistive device, cuing for safety, supine to sit transfers with supervision.  She could gather belongings for activities of daily living and homemaking.  Full family teaching was completed and plan discharge  to home.  DISCHARGE MEDICATIONS: 1. Aspirin 81 mg p.o. daily. 2. Wellbutrin 300 mg p.o. daily. 3. Plavix 75 mg p.o. daily. 4. Voltaren gel 4 times a day to affected area. 5. Atarax 25 mg p.o. at bedtime. 6. Lopressor 12.5 mg p.o. b.i.d. 7. MiraLAX daily. 8. Pravachol 40 mg daily. 9. Celebrex 200 mg  daily as needed. 10.Valium 5 mg daily as needed.  DISCHARGE INSTRUCTIONS:  Her diet was regular.  She would follow up with Dr. Alysia Penna at the [facility] as directed; Dr. Erlinda Hong, Neurology Service, call for appointment; Dr. Seward Carol, medical management.     Lauraine Rinne, P.A.   ______________________________ Charlett Blake, M.D.    DA/MEDQ  D:  01/18/2017  T:  01/18/2017  Job:  [id number]  cc:   Ashby Dawes. Polite, M.D. Charlett Blake, M.D. Dr. Erlinda Hong

## 2017-01-18 NOTE — Discharge Summary (Signed)
Discharge summary job [id number]

## 2017-01-18 NOTE — Progress Notes (Signed)
Physical Therapy Session Note  Patient Details  Name: [name] MRN: [id number] Date of Birth: 03-02-1930  Today's Date: 01/18/2017 PT Individual Time: 0800-0900 PT Individual Time Calculation (min): 60 min   Short Term Goals: Week 1:  PT Short Term Goal 1 (Week 1): Pt will demonstrate intellectual awareness with min question cues.  PT Short Term Goal 2 (Week 1): Pt will demonstrate safe transfers with rollator by locking brakes without cues in 75% of opportunities. PT Short Term Goal 3 (Week 1): Pt will ambulate with rollator and close supervision at least 50% of the time.   Skilled Therapeutic Interventions/Progress Updates: T1: Pt presented attempting to get OOB and alarm activated. Pt agreeable to therapy after advising that she will be going home today. Pt requesting to brush teeth, ambulated with RW to sink and brushed teeth with single UE support at counter. Once pt in change of clothes, PTA applied Voltaren gel on B knees with nsg notified. Pt ambulated to ortho gym with rolllator and supervision. Cues for decreased speed with ambulation and cues for safety with rollator when stopping and performing transfers. Pt performed functional activities including car transfer, picking up object, bed mobilty, and transfers all performed with supervision or mod I. Pt ascend/descend x 12 steps with 2 rails and with 1 rail with supervision. Performed Berg balance test with updated score of 44/56 improved from 38/56, however continues to be indicative of increased fall risk. Pt returned to room in same manner as prior with pt returning to bed to rest until next therapy session and with current needs met.      Therapy Documentation Precautions:  Precautions Precautions: Fall Precaution Comments: Impulsive, very poor safety awareness Restrictions Weight Bearing Restrictions: No General:   Vital Signs:  Pain: Pain Assessment Pain Score: 0-No pain Mobility: Bed Mobility Bed Mobility:  Sit to Supine;Supine to Sit Supine to Sit: 5: Supervision Sit to Supine: 5: Supervision Transfers Transfers: Yes Sit to Stand: 5: Supervision Stand to Sit: 5: Supervision Stand Pivot Transfers: 5: Supervision Locomotion : Ambulation Ambulation: Yes Ambulation/Gait Assistance: 5: Supervision Ambulation Distance (Feet): 150 Feet Assistive device: Rolling walker Ambulation/Gait Assistance Details: Verbal cues for precautions/safety;Verbal cues for safe use of DME/AE Gait Gait: Yes Gait Pattern: Impaired Gait Pattern: Trunk flexed Stairs / Additional Locomotion Stairs: Yes Stairs Assistance: 5: Supervision Stair Management Technique: Two rails Number of Stairs: 12 Height of Stairs: 6  Trunk/Postural Assessment : Cervical Assessment Cervical Assessment: Within Functional Limits Thoracic Assessment Thoracic Assessment: Within Functional Limits Lumbar Assessment Lumbar Assessment: Within Functional Limits Postural Control Postural Control: Deficits on evaluation  Balance: Standardized Balance Assessment Standardized Balance Assessment: Berg Balance Test Berg Balance Test Sit to Stand: Able to stand without using hands and stabilize independently Standing Unsupported: Able to stand safely 2 minutes Sitting with Back Unsupported but Feet Supported on Floor or Stool: Able to sit safely and securely 2 minutes Stand to Sit: Controls descent by using hands Transfers: Able to transfer safely, definite need of hands Standing Unsupported with Eyes Closed: Able to stand 10 seconds with supervision Standing Ubsupported with Feet Together: Able to place feet together independently and stand for 1 minute with supervision From Standing, Reach Forward with Outstretched Arm: Can reach forward >12 cm safely (5") From Standing Position, Pick up Object from Floor: Able to pick up shoe safely and easily From Standing Position, Turn to Look Behind Over each Shoulder: Looks behind from both sides  and weight shifts well Turn 360 Degrees: Able to  turn 360 degrees safely but slowly Standing Unsupported, Alternately Place Feet on Step/Stool: Able to stand independently and complete 8 steps >20 seconds Standing Unsupported, One Foot in Front: Able to plae foot ahead of the other independently and hold 30 seconds Standing on One Leg: Tries to lift leg/unable to hold 3 seconds but remains standing independently Total Score: 44 Dynamic Standing Balance Dynamic Standing - Balance Support: Bilateral upper extremity supported Dynamic Standing - Level of Assistance: 5: Stand by assistance   See Function Navigator for Current Functional Status.   Therapy/Group: Individual Therapy      , PTA  01/18/2017, 12:22 PM

## 2017-01-19 ENCOUNTER — Telehealth: Payer: Self-pay | Admitting: *Deleted

## 2017-01-19 NOTE — Telephone Encounter (Signed)
Transitional care call completed, appointment confirmed. Address confirmed, new patient packet mailed.  Transitional Care Questions  Questions for our staff to ask patients on Transitional care 48 hour phone call:   1. Are you/is patient experiencing any problems since coming home? Patient is not enamored with her walker. She wants independence from it.  Are there any questions regarding any aspect of care? No  2. Are there any questions regarding medications administration/dosing? Patient wishes she had an automatic pill dispenser/reminder.   Are meds being taken as prescribed? Yes Patient should review meds with caller to confirm   3. Have there been any falls? Yes, ran walker into a kitchen stool causing a tumble.  4. Has Home Health been to the house and/or have they contacted you? Yes If not, have you tried to contact them? Can we help you contact them? no   5. Are bowels and bladder emptying properly? Constipation Are there any unexpected incontinence issues? no If applicable, is patient following bowel/bladder programs?  6. Any fevers, problems with breathing, unexpected pain? Right side tingling, lack of feeling/sensation   7. Are there any skin problems or new areas of breakdown? No  8. Has the patient/family member arranged specialty MD follow up (ie cardiology/neurology/renal/surgical/etc)? Patient is not happy with her PCP. I spent significant time counseling patient on the idea of quality doctor to patient relationships.  I encouraged her to call her PCP clinic and speak with the clinic manager to help resolve.  Can we help arrange?   9. Does the patient need any other services or support that we can help arrange? No  10. Are caregivers following through as expected in assisting the patient? Yes   11. Has the patient quit smoking, drinking alcohol, or using drugs as recommended? No smoke, no drink, no illicit drugs

## 2017-01-20 ENCOUNTER — Other Ambulatory Visit: Payer: Self-pay

## 2017-01-20 NOTE — Patient Outreach (Signed)
[facility]) Care Management  01/20/2017  [name] 1929/10/01 [id number]     EMMI- STROKE RED ON EMMI ALERT Day # 1 Date: 01/19/17 Red Alert Reason: "Scheduled a follow up appt? No"   "Filled new prescription? No"   Outreach attempt # 1 to patient. Spoke with patient. States she is doing fine since discharge home. Reviewed and addressed red alerts with patient. She reports that her paid caregiver has called and made f/u appts for her and keeps tracks of her appts for her. She voices that she has transportation to appts. Patient reports that she did get all her meds filled. However, then she questioned and asked if she should be taking a baby ASA. Confirmed per discharge summary that she should be doing so. Patient reports that she did not get that med. Advised patient that she did not need a script for med as it can be purchased over the counter. She reported that she would have someone go get med for her and start taking it. Patient confirmed that [facility] services are in place and someone has already contacted her. She denies any further RN CM needs or concerns at this time. She is aware of worsening s/s and when to seek medical attention. Advised patient that they would continue to get automated EMMI-Stroke post discharge calls to assess how they are doing following recent hospitalization and will receive a call from a nurse if any of their responses were abnormal. Patient voiced understanding and was appreciative of f/u call.       Plan: RN CM will notify [facility] administrative assistant of case status.   Enzo Montgomery, RN,BSN,CCM [place] Management Telephonic Care Management Coordinator Direct Phone: [phone] Toll Free: [phone] Fax: [phone]

## 2017-01-24 ENCOUNTER — Other Ambulatory Visit: Payer: Self-pay

## 2017-01-24 NOTE — Patient Outreach (Signed)
[facility]) Care Management  01/24/2017  Selena EHRHARD [birth year] [id number]     EMMI- STROKE RED ON EMMI ALERT Day # 3 Date: 01/21/17 Red Alert Reason: "Questions/problems with meds? Yes"    Outreach attempt # 1 to patient. Spoke with patient. States she is doing okay. She is having some pain but has been told that is a good sign as it means that her "feeling is coming back. She voices that she took some Tylenol prior to this call. HHPT is on the way out to see her. Reviewed and addressed red alert with patient. She responds that way as she states she is about to run out of some her meds. Patient reports she uses mail order via [place] and gets 90 day supply for most meds. She will contact MD office to alert them as she states some of the meds don't have any refills on them. Confirmed with patient that she has f/u appt on tomorrow. Patient received stroke patient education book. She states she wish she would have known the info in book prior to her stroke as she would not have "waited' to contact 911. Patient aware that if symptoms present again to call 911 right away. She also voices that she has medical alert necklace and knows how to activate it. No further RN CM needs or concerns at this time. Advised patient that they would continue to get automated EMMI-Stroke post discharge calls to assess how they are doing following recent hospitalization and will receive a call from a nurse if any of their responses were abnormal. Patient voiced understanding and was appreciative of f/u call.      Plan: RN CM will notify [facility] administrative assistant of case status.   Enzo Montgomery, RN,BSN,CCM [place] Management Telephonic Care Management Coordinator Direct Phone: [phone] Toll Free: [phone] Fax: [phone]

## 2017-01-25 ENCOUNTER — Encounter: Payer: Medicare Other | Attending: Physical Medicine & Rehabilitation

## 2017-01-25 ENCOUNTER — Other Ambulatory Visit: Payer: Self-pay | Admitting: *Deleted

## 2017-01-25 ENCOUNTER — Ambulatory Visit (HOSPITAL_BASED_OUTPATIENT_CLINIC_OR_DEPARTMENT_OTHER): Payer: Medicare Other | Admitting: Physical Medicine & Rehabilitation

## 2017-01-25 ENCOUNTER — Encounter: Payer: Self-pay | Admitting: Physical Medicine & Rehabilitation

## 2017-01-25 VITALS — BP 176/72 | HR 77

## 2017-01-25 DIAGNOSIS — F419 Anxiety disorder, unspecified: Secondary | ICD-10-CM | POA: Insufficient documentation

## 2017-01-25 DIAGNOSIS — Z853 Personal history of malignant neoplasm of breast: Secondary | ICD-10-CM | POA: Diagnosis not present

## 2017-01-25 DIAGNOSIS — R209 Unspecified disturbances of skin sensation: Secondary | ICD-10-CM

## 2017-01-25 DIAGNOSIS — I69398 Other sequelae of cerebral infarction: Secondary | ICD-10-CM | POA: Diagnosis not present

## 2017-01-25 DIAGNOSIS — D751 Secondary polycythemia: Secondary | ICD-10-CM | POA: Diagnosis not present

## 2017-01-25 DIAGNOSIS — K589 Irritable bowel syndrome without diarrhea: Secondary | ICD-10-CM | POA: Diagnosis not present

## 2017-01-25 DIAGNOSIS — F329 Major depressive disorder, single episode, unspecified: Secondary | ICD-10-CM | POA: Insufficient documentation

## 2017-01-25 DIAGNOSIS — Z825 Family history of asthma and other chronic lower respiratory diseases: Secondary | ICD-10-CM | POA: Insufficient documentation

## 2017-01-25 DIAGNOSIS — Z7982 Long term (current) use of aspirin: Secondary | ICD-10-CM | POA: Diagnosis not present

## 2017-01-25 DIAGNOSIS — R42 Dizziness and giddiness: Secondary | ICD-10-CM | POA: Insufficient documentation

## 2017-01-25 DIAGNOSIS — R269 Unspecified abnormalities of gait and mobility: Secondary | ICD-10-CM | POA: Insufficient documentation

## 2017-01-25 DIAGNOSIS — R6889 Other general symptoms and signs: Secondary | ICD-10-CM | POA: Insufficient documentation

## 2017-01-25 DIAGNOSIS — Z8249 Family history of ischemic heart disease and other diseases of the circulatory system: Secondary | ICD-10-CM | POA: Insufficient documentation

## 2017-01-25 DIAGNOSIS — G8929 Other chronic pain: Secondary | ICD-10-CM | POA: Diagnosis not present

## 2017-01-25 DIAGNOSIS — E785 Hyperlipidemia, unspecified: Secondary | ICD-10-CM | POA: Diagnosis not present

## 2017-01-25 DIAGNOSIS — Z87891 Personal history of nicotine dependence: Secondary | ICD-10-CM | POA: Insufficient documentation

## 2017-01-25 DIAGNOSIS — Z8673 Personal history of transient ischemic attack (TIA), and cerebral infarction without residual deficits: Secondary | ICD-10-CM | POA: Insufficient documentation

## 2017-01-25 DIAGNOSIS — M810 Age-related osteoporosis without current pathological fracture: Secondary | ICD-10-CM | POA: Diagnosis not present

## 2017-01-25 DIAGNOSIS — I10 Essential (primary) hypertension: Secondary | ICD-10-CM | POA: Insufficient documentation

## 2017-01-25 MED ORDER — MAGIC MOUTHWASH W/LIDOCAINE: 5.0000 mL | Freq: Three times a day (TID) | ORAL | 0 refills | Status: DC

## 2017-01-25 NOTE — Patient Outreach (Signed)
[facility]) Care Management  01/25/2017  [name] Aug 12, 1930 [id number]  EMMI referral with red dashboard for feeling worse overall on Day #6:  Telephone call to patient who voices that she felt worse in reference to pain in fingers of right hand. States she is taking pain medication which helps with pain. States MD is aware of the pain that she is having. States  sensation is coming back to fingers following her stroke. States unable to use hand. Voices that she uses walker to get around.   States she attended appointment with physical rehab MD today. States she currently has someone that takes her to MD appointments because she is restricted from driving. Voices that she has appointment with primary care provider tomorrow.    Patient voices that she has home health services but has not had services of RN or occupational therapist yet. States she has seen physical therapy.  Advised patient that I would notify [place] to give notification of next  Home health visits. Patient states she does have home health  contact number if needed.    Patient voices that she has medic alert bracelet that she can activate if she needs emergency help. Voices that she has stroke book but does not like to read it because it makes her nervous about possibility of having another stroke.  States she does know symptoms of stroke now & will call for emergency help if she has those symptoms.   States she does have assistance during the day for help with personal care ,  Cooking, running errands, and other things.  Patient states she manages her own medications and takes them as prescribed.   No further concerns.   Plan: Call [facility] to advise patient of next scheduled home health visits. Close emmi-case/send to care management assistant to close.  Sherrin Daisy, RN BSN [place] Management Coordinator [facility] Care Management  [phone]

## 2017-01-25 NOTE — Progress Notes (Signed)
Subjective:  Transitional care visit  Patient ID: [name], female    DOB: October 10, 1929, 83 y.o.   MRN: [id number] 83 year old right-handed female history of polycythemia, right breast cancer, hypertension, hyperlipidemia, and TIA, maintained on aspirin, who lives alone, independent with assistive device prior to admission.  She does of a niece close by.  Presented on Jan 07, 2017, with acute right-sided weakness and slurred speech.  CT showed acute left thalamus CVA.  MRI, acute ischemia within the left thalamus, no hemorrhage or mass effect. Echocardiogram with diastolic dysfunction, but otherwise normal MRA with moderate to severe stenosis, left MCA M2 branches.  The patient did not receive tPA, started on dual anti-platelet therapy, aspirin and Plavix. DATE OF ADMISSION:  01/10/2017 DATE OF DISCHARGE:  01/18/2017  HPI Right thumb and palm and forearm pain.   HHPT  Has visited but not OT or RN  Has an appt with PCP today  No new bowel or bladder issues, hx IBS and IC  Right side mouth is numb and she bites inside of mouth   Pain Inventory Average Pain 7 Pain Right Now 7 My pain is .  In the last 24 hours, has pain interfered with the following? General activity 7 Relation with others 1 Enjoyment of life 10 What TIME of day is your pain at its worst? all Sleep (in general) Good  Pain is worse with: walking, bending, sitting, inactivity, standing and some activites Pain improves with: heat/ice and medication Relief from Meds: 5  Mobility walk with assistance use a cane use a walker ability to climb steps?  yes do you drive?  no  Function retired  Neuro/Psych bladder control problems bowel control problems weakness numbness tremor tingling trouble walking dizziness depression anxiety  Prior Studies Any changes since last visit?  yes  Physicians involved in your care Any changes since last visit?  yes   Family History  Problem  Relation Age of Onset  . Heart disease Sister   . Hypertension Sister   . Asthma Sister   . Breast cancer Maternal Aunt   . Ovarian cancer Paternal Aunt   . Colon cancer Neg Hx   . Esophageal cancer Neg Hx   . Pancreatic cancer Neg Hx   . Rectal cancer Neg Hx   . Stomach cancer Neg Hx    Social History   Social History  . Marital status: Married    Spouse name: N/A  . Number of children: 0  . Years of education: N/A   Social History Main Topics  . Smoking status: Former Smoker    Packs/day: 1.00    Years: 42.00    Types: Cigarettes    Quit date: 08/23/1977  . Smokeless tobacco: Never Used  . Alcohol use No  . Drug use: No  . Sexual activity: Not on file   Other Topics Concern  . Not on file   Social History Narrative  . No narrative on file   Past Surgical History:  Procedure Laterality Date  . ABDOMINAL HYSTERECTOMY    . BACK SURGERY    . BREAST SURGERY     mastectomy -right  . CERVICAL SPINE SURGERY    . CHOLECYSTECTOMY    . COLONOSCOPY W/ BIOPSIES    . cortisone injection  10/16/12   left knee  . FIXATION KYPHOPLASTY LUMBAR SPINE    . HEMORRHOID BANDING  2015-16  . PARATHYROID EXPLORATION  06/29/2011   Procedure: PARATHYROID EXPLORATION;  Surgeon: Earnstine Regal, MD;  Location: [facility] OR[facility];  Service: General;  Laterality: Left;  Left Superior Parathyroidectomy  . PARATHYROIDECTOMY  06/29/11   Past Medical History:  Diagnosis Date  . Adenomatous colon polyp 04/2005  . Allergic    Ane[place]ine  . ANXIETY 10/13/2007  . BACK PAIN 03/01/2007  . BREAST CANCER, HX OF 12/06/2007  . Cancer [facility])    RIGHT mastectomy with node dissection  . Complication of anesthesia    family aggergy to annectine  . Compression fracture of L4 lumbar vertebra (HCC) 02/2013  . CONSTIPATION 01/06/2009  . DEPRESSION 10/13/2007  . Diverticular disease   . DIVERTICULOSIS, COLON 05/11/2005  . External hemorrhoids without mention of complication 1/94/1740  . FATIGUE 08/07/2008  . HEMORRHOIDS,  INTERNAL 05/11/2005  . Hypercalcemia 04/23/2009  . HYPERLIPIDEMIA NEC/NOS 03/01/2007  . HYPERTENSION 10/13/2007  . Impaired fasting glucose 11/14/2008  . INTERSTITIAL CYSTITIS 09/06/2007  . Irritable bowel syndrome 01/06/2009  . LIVER MASS 05/15/2009  . OSTEOPOROSIS 05/09/2009  . PARESTHESIA 09/06/2007  . POLYCYTHEMIA 04/23/2009  . STENOSIS, SPINAL, UNSPC REGION 03/01/2007  . Thyroid disease    There were no vitals taken for this visit.  Opioid Risk Score:   Fall Risk Score:  `1  Depression screen PHQ 2/9  Depression screen [facility] 2/9 06/23/2012 02/02/2012 02/02/2012  Decreased Interest 1 1 1   Down, Depressed, Hopeless 1 1 1   PHQ - 2 Score 2 2 2   Some recent data might be hidden    Review of Systems  Constitutional: Negative.   HENT: Negative.   Eyes: Negative.   Respiratory: Negative.   Cardiovascular: Negative.   Gastrointestinal: Positive for constipation and diarrhea.  Endocrine: Negative.   Genitourinary: Negative.   Musculoskeletal: Negative.   Skin: Negative.   Allergic/Immunologic: Negative.   Neurological: Negative.   Hematological: Negative.   Psychiatric/Behavioral: Negative.   All other systems reviewed and are negative.      Objective:   Physical Exam  Constitutional: She is oriented to person, place, and time. She appears well-developed and well-nourished.  HENT:  Head: Normocephalic and atraumatic.  Eyes: Conjunctivae and EOM are normal. Pupils are equal, round, and reactive to light.  Cardiovascular: Normal rate and regular rhythm.  Exam reveals no friction rub.   No murmur heard. Pulmonary/Chest: Effort normal and breath sounds normal. No respiratory distress. She has no wheezes.  Abdominal: Soft. Bowel sounds are normal. She exhibits no distension. There is no tenderness.  Neurological: She is alert and oriented to person, place, and time.  Psychiatric: She has a normal mood and affect. Her behavior is normal. Judgment and thought content normal.  Nursing note  and vitals reviewed. Pinprick sensation Decreased sensation right hemifacial V1, V2, V3. Decreased sensation right lower extremity. Normal sensation right upper extremity  Falls to left with feet together Ambulates wide base of support, short step length with standby assist without the walker. With a walker. She is able to ambulate modified independent. Motor strength is 5/5 bilateral deltoid, bicep, tricep, grip, hip flexor, knee extensor, ankle dorsi flexors.    Assessment & Plan:  1. Left thalamic stroke with gait disorder, right hemisensory deficits Continue home health PT, OT Physical medicine and rehabilitation follow-up 6 weeks, we will transition to outpatient at that time  2. Chronic pain and dizziness. We discussed that her Manuela Neptune may be causing dizziness. She was under the impression that chronic pain did cause dizziness.  3. Secondary stroke prevention. We discussed that stress may be only part of the reason she had the stroke  and that there are long-term issues with hypertension as it affects the cerebral vasculature, she has severe stenosis and bilateral M2, and P2 vessels. We discussed the need to follow-up with neurology

## 2017-01-25 NOTE — Patient Instructions (Addendum)
Please make appointment with neurologist Dr Erlinda Hong at [facility]

## 2017-01-25 NOTE — Patient Outreach (Signed)
Patient triggered RED on Emmi Stroke Dashboard, notification sent to:  Enzo Montgomery, RN

## 2017-01-26 ENCOUNTER — Telehealth: Payer: Self-pay

## 2017-01-26 NOTE — Telephone Encounter (Signed)
[facility] called requesting how you want this patients magic mouthwash to be constructed, which formulation and how much of each ingredient for it, please advise

## 2017-01-27 MED ORDER — NONFORMULARY OR COMPOUNDED ITEM: 0 refills | Status: DC

## 2017-01-27 NOTE — Addendum Note (Signed)
Addended by: Wenda Overland on: 01/27/2017 02:17 PM   Modules accepted: Orders

## 2017-01-27 NOTE — Telephone Encounter (Addendum)
Called concentration to the pharmacy.  [facility] says [facility] so called to [facility] location.  The original Rx went to [facility]. (order placed in [facility] as non formulary or compounded medication)

## 2017-01-27 NOTE — Telephone Encounter (Signed)
Equal parts Nystatin suspension 100,00U/ml Viscous Lidocaine 2%  60ml Swish and spit q 6 h prn

## 2017-02-07 ENCOUNTER — Ambulatory Visit (HOSPITAL_COMMUNITY)
Admission: EM | Admit: 2017-02-07 | Discharge: 2017-02-07 | Disposition: A | Discharge status: Nursing Home (Includes ICF) | Payer: Medicare Other

## 2017-02-07 ENCOUNTER — Emergency Department (HOSPITAL_COMMUNITY)
Admission: EM | Admit: 2017-02-07 | Discharge: 2017-02-07 | Disposition: A | Discharge status: Home / Self Care | Payer: Medicare Other | Attending: Emergency Medicine | Admitting: Emergency Medicine

## 2017-02-07 ENCOUNTER — Encounter (HOSPITAL_COMMUNITY): Payer: Self-pay | Admitting: Emergency Medicine

## 2017-02-07 DIAGNOSIS — Z7982 Long term (current) use of aspirin: Secondary | ICD-10-CM | POA: Insufficient documentation

## 2017-02-07 DIAGNOSIS — Z853 Personal history of malignant neoplasm of breast: Secondary | ICD-10-CM | POA: Diagnosis not present

## 2017-02-07 DIAGNOSIS — Z79899 Other long term (current) drug therapy: Secondary | ICD-10-CM | POA: Diagnosis not present

## 2017-02-07 DIAGNOSIS — R200 Anesthesia of skin: Secondary | ICD-10-CM | POA: Diagnosis present

## 2017-02-07 DIAGNOSIS — Z8673 Personal history of transient ischemic attack (TIA), and cerebral infarction without residual deficits: Secondary | ICD-10-CM | POA: Insufficient documentation

## 2017-02-07 DIAGNOSIS — Z87891 Personal history of nicotine dependence: Secondary | ICD-10-CM | POA: Diagnosis not present

## 2017-02-07 DIAGNOSIS — R202 Paresthesia of skin: Secondary | ICD-10-CM | POA: Diagnosis not present

## 2017-02-07 DIAGNOSIS — I10 Essential (primary) hypertension: Secondary | ICD-10-CM | POA: Diagnosis not present

## 2017-02-07 NOTE — ED Provider Notes (Signed)
[place] DEPT Provider Note   CSN: [id number] Arrival date & time: 02/07/17  1015     History   Chief Complaint Chief Complaint  Patient presents with  . Cerebrovascular Accident    HPI [name] is a 83 y.o. female.  HPI Patient had a CVA diagnosed in May. Predominant symptoms were numbness of the right side of her face and her upper extremity. She reports for the past 4 or 5 days in particular she is noted tingling sensations that come and go in her face and hand. The numbness from her stroke seems to be improving slightly over time. She has not developed any new weakness. No headache. Patient reports she is compliant with her blood pressure pressure medications although she doesn't think that she really needs them. She thinks her blood pressure waxes and wanes with extremes of mood. She reports she is compliant with her Plavix and aspirin. Patient reports however she does not believe that generic medications work for her. Past Medical History:  Diagnosis Date  . Adenomatous colon polyp 04/2005  . Allergic    Anectine  . ANXIETY 10/13/2007  . BACK PAIN 03/01/2007  . BREAST CANCER, HX OF 12/06/2007  . Cancer [facility])    RIGHT mastectomy with node dissection  . Complication of anesthesia    family aggergy to annectine  . Compression fracture of L4 lumbar vertebra (HCC) 02/2013  . CONSTIPATION 01/06/2009  . DEPRESSION 10/13/2007  . Diverticular disease   . DIVERTICULOSIS, COLON 05/11/2005  . External hemorrhoids without mention of complication 2/29/7989  . FATIGUE 08/07/2008  . HEMORRHOIDS, INTERNAL 05/11/2005  . Hypercalcemia 04/23/2009  . HYPERLIPIDEMIA NEC/NOS 03/01/2007  . HYPERTENSION 10/13/2007  . Impaired fasting glucose 11/14/2008  . INTERSTITIAL CYSTITIS 09/06/2007  . Irritable bowel syndrome 01/06/2009  . LIVER MASS 05/15/2009  . OSTEOPOROSIS 05/09/2009  . PARESTHESIA 09/06/2007  . POLYCYTHEMIA 04/23/2009  . STENOSIS, SPINAL, UNSPC REGION 03/01/2007  . Thyroid  disease     Patient Active Problem List   Diagnosis Date Noted  . Acute arterial ischemic stroke, vertebrobasilar, thalamic, left ([place]) 01/10/2017  . Neurologic gait disorder   . Depression   . Benign essential HTN   . History of breast cancer   . Stroke (cerebrum) ([place]) 01/08/2017  . TIA (transient ischemic attack) 12/16/2016  . Dysarthria 12/16/2016  . Leg weakness 12/16/2016  . EKG abnormalities 12/16/2016  . Chronic pain 12/16/2016  . Elevated blood pressure 12/22/2014  . Internal hemorrhoids with bleeding and prolapse 11/04/2014  . Lactose intolerance 11/04/2014  . Hemorrhoid 09/11/2014  . Rectal bleeding 09/11/2014  . IBS (irritable bowel syndrome) 09/25/2013  . Hyperparathyroidism, primary ([place]) 05/26/2011  . Osteoporosis 05/09/2009  . POLYCYTHEMIA 04/23/2009  . IMPAIRED FASTING GLUCOSE 11/14/2008  . BREAST CANCER, HX OF 12/06/2007  . Anxiety state 10/13/2007  . Essential hypertension 10/13/2007  . INTERSTITIAL CYSTITIS 09/06/2007  . HLD (hyperlipidemia) 03/01/2007  . Backache 03/01/2007    Past Surgical History:  Procedure Laterality Date  . ABDOMINAL HYSTERECTOMY    . BACK SURGERY    . BREAST SURGERY     mastectomy -right  . CERVICAL SPINE SURGERY    . CHOLECYSTECTOMY    . COLONOSCOPY W/ BIOPSIES    . cortisone injection  10/16/12   left knee  . FIXATION KYPHOPLASTY LUMBAR SPINE    . HEMORRHOID BANDING  2015-16  . PARATHYROID EXPLORATION  06/29/2011   Procedure: PARATHYROID EXPLORATION;  Surgeon: Earnstine Regal, MD;  Location: [facility] ORS;  Service: General;  Laterality: Left;  Left Superior Parathyroidectomy  . PARATHYROIDECTOMY  06/29/11    OB History    No data available       Home Medications    Prior to Admission medications   Medication Sig Start Date End Date Taking? Authorizing Provider  acetaminophen (TYLENOL) 500 MG tablet Take 1,000 mg by mouth every 6 (six) hours as needed for mild pain.    [provider]  Alum & Mag  Hydroxide-Simeth (GELUSIL PO) Take 1 tablet by mouth daily as needed (acid reflux).    [provider]  Ascorbic Acid (VITAMIN C PO) Take 1 tablet by mouth daily as needed (when not eating enough fruit).    [provider]  aspirin 81 MG chewable tablet Chew 1 tablet (81 mg total) by mouth daily. 12/18/16   Rosita Fire, MD  buPROPion (WELLBUTRIN XL) 300 MG 24 hr tablet Take 1 tablet (300 mg total) by mouth daily. 01/18/17   Angiulli, Lavon Paganini, PA-C  CALCIUM-MAGNESIUM PO Take 1 tablet by mouth daily.    [provider]  carisoprodol (SOMA) 350 MG tablet Take 1 tablet (350 mg total) by mouth daily as needed (muscle spasms). 01/18/17   Angiulli, Lavon Paganini, PA-C  celecoxib (CELEBREX) 200 MG capsule Take 1 capsule (200 mg total) by mouth daily as needed (arthritis pain). 01/18/17   Angiulli, Lavon Paganini, PA-C  Cholecalciferol (VITAMIN D3) 1000 units CAPS Take 1 capsule (1,000 Units total) by mouth daily. 01/18/17   Angiulli, Lavon Paganini, PA-C  clopidogrel (PLAVIX) 75 MG tablet Take 1 tablet (75 mg total) by mouth daily. 01/19/17   Angiulli, Lavon Paganini, PA-C  Cyanocobalamin (VITAMIN B-12 PO) Take 1 tablet by mouth daily.    [provider]  diazepam (VALIUM) 5 MG tablet Take 1 tablet (5 mg total) by mouth daily as needed for anxiety. 01/18/17   Angiulli, Lavon Paganini, PA-C  diclofenac sodium (VOLTAREN) 1 % GEL Apply 2 g topically 4 (four) times daily. 01/18/17   Angiulli, Lavon Paganini, PA-C  estradiol (ESTRACE) 0.1 MG/GM vaginal cream Place 1 Applicatorful vaginally daily as needed (for dryness).     [provider]  fluticasone (FLONASE) 50 MCG/ACT nasal spray Place 2 sprays into both nostrils daily. Patient taking differently: Place 2 sprays into both nostrils daily as needed for allergies.  09/06/14   Hoyt Koch, MD  hydrOXYzine (VISTARIL) 25 MG capsule Take 1 capsule (25 mg total) by mouth at bedtime. 01/18/17   Angiulli, Lavon Paganini, PA-C  magic mouthwash  w/lidocaine SOLN Take 5 mLs by mouth 3 (three) times daily. 01/25/17   Kirsteins, Luanna Salk, MD  metoprolol tartrate (LOPRESSOR) 25 MG tablet Take 1 tablet (25 mg total) by mouth 2 (two) times daily. 01/18/17   Angiulli, Lavon Paganini, PA-C  Multiple Vitamin (MULTIVITAMIN WITH MINERALS) TABS tablet Take 1 tablet by mouth daily as needed (when not eating well).    [provider]  NONFORMULARY OR COMPOUNDED ITEM Equal parts Nystatin 100,000 units/ml and viscous lidocaine 2%.  Swish 15 ml and spit q 6 hour prn disp 250 ml 0 RF 01/27/17   Kirsteins, Luanna Salk, MD  oxyCODONE (OXY IR/ROXICODONE) 5 MG immediate release tablet Take 1 tablet (5 mg total) by mouth daily as needed for moderate pain or severe pain. 01/18/17   Angiulli, Lavon Paganini, PA-C  polyethylene glycol (MIRALAX / GLYCOLAX) packet Take 17 g by mouth daily. 01/19/17   Angiulli, Lavon Paganini, PA-C  pravastatin (PRAVACHOL) 40 MG tablet Take 1  tablet (40 mg total) by mouth daily at 6 PM. 01/18/17   Angiulli, Lavon Paganini, PA-C  VITAMIN E PO Take 1 capsule by mouth daily.    [provider]  Zoledronic Acid (RECLAST IV) Inject into the vein. Yearly injection - last injection March 2018    [provider]    Family History Family History  Problem Relation Age of Onset  . Heart disease Sister   . Hypertension Sister   . Asthma Sister   . Breast cancer Maternal Aunt   . Ovarian cancer Paternal Aunt   . Colon cancer Neg Hx   . Esophageal cancer Neg Hx   . Pancreatic cancer Neg Hx   . Rectal cancer Neg Hx   . Stomach cancer Neg Hx     Social History Social History  Substance Use Topics  . Smoking status: Former Smoker    Packs/day: 1.00    Years: 42.00    Types: Cigarettes    Quit date: 08/23/1977  . Smokeless tobacco: Never Used  . Alcohol use No     Allergies   Anectine [succinylcholine chloride]; Dilaudid [hydromorphone hcl]; Hydrocodone-acetaminophen; Iodine; Naproxen sodium; Oxycodone hcl; Pentosan polysulfate sodium;  Pseudoephedrine; Succinylcholine; Tape; Tramadol hcl; and Shellfish allergy   Review of Systems Review of Systems 10 Systems reviewed and are negative for acute change except as noted in the HPI.   Physical Exam Updated Vital Signs BP (!) 180/69 (BP Location: Left Arm)   Pulse 84   Temp 98.3 F (36.8 C) (Oral)   Resp 16   Ht 5\' 3"  (1.6 m)   Wt 80.3 kg (177 lb)   SpO2 94%   BMI 31.35 kg/m   Physical Exam  Constitutional: She is oriented to person, place, and time. She appears well-developed and well-nourished. No distress.  HENT:  Head: Normocephalic and atraumatic.  Nose: Nose normal.  Mouth/Throat: Oropharynx is clear and moist.  Eyes: Conjunctivae and EOM are normal.  Neck: Neck supple.  Cardiovascular: Normal rate, regular rhythm, normal heart sounds and intact distal pulses.   No murmur heard. Pulmonary/Chest: Effort normal and breath sounds normal. No respiratory distress.  Abdominal: Soft. There is no tenderness.  Musculoskeletal: Normal range of motion. She exhibits no edema.  Neurological: She is alert and oriented to person, place, and time. No cranial nerve deficit. She exhibits normal muscle tone. Coordination normal.  Cranial nerves II through XII are intact. Tongue is midline. Upper extremity strength is 5\5 for grip flexion and extension. Patient does endorse subjective sensory difference to light touch on right to left of face as well as right upper extremity. She however qualifies that it does seem more sensitive than it was at the time of her stroke.  Skin: Skin is warm and dry.  Psychiatric: She has a normal mood and affect.  Nursing note and vitals reviewed.    ED Treatments / Results  Labs (all labs ordered are listed, but only abnormal results are displayed) Labs Reviewed - No data to display  EKG  EKG Interpretation None       Radiology No results found.  Procedures Procedures (including critical care time)  Medications Ordered in  ED Medications - No data to display   Initial Impression / Assessment and Plan / ED Course  I have reviewed the triage vital signs and the nursing notes.  Pertinent labs & imaging results that were available during my care of the patient were reviewed by me and considered in my medical decision making (  see chart for details).      Final Clinical Impressions(s) / ED Diagnoses   Final diagnoses:  History of CVA (cerebrovascular accident)  Paresthesia   Patient has recent CVA which was sensory numbness. In the area of her numbness she is now experiencing waxing and waning paresthesia. Numbness has not worsened. No new motor deficit has developed. Patient is on Plavix and aspirin. At this time stroke precautions are reviewed and patient is counseled to follow-up with her PCP/neurologist. Patient is concerned that generic medications are not effective for her. I have advised that she needs to address this with her PCP to see if it is financially feasible to prescribe non-generic medications if that is what the patient wishes. New Prescriptions New Prescriptions   No medications on file     Charlesetta Shanks, MD 02/07/17 1215

## 2017-02-07 NOTE — ED Notes (Signed)
EDP at bedside  

## 2017-02-07 NOTE — ED Triage Notes (Signed)
Pt reports tingling in head and throat on waking, resolved at this time. Pt reports recent CVA, reports ongoing symptoms. Pt denies new s/s, CP, dizziness, HA. Pt appears anxious.

## 2017-02-07 NOTE — ED Notes (Addendum)
Pt  Reports  Symptoms   Of    Weakness  Tingling  Anxious  Feeling        Numbness  In  Fingers     And    ingling   In  Fingers    lawerence  The  fnp   Examined  Pt  Determined  Pt  Needed  To  Be  evaulted  In  Told  To  Proceed  Directly  To  Er  Family  With  Patient

## 2017-02-07 NOTE — ED Notes (Signed)
Pt. Doesn't want to wear gown because she doesn't want anymore tests done. She thinks she had a dream and not a stroke. Pt. States no one has explained her medications that they prescribed to her. She also states she wants to know what to expect as she recovers from her stroke. Pt. States she went to the neurologist, but did not ask them any of these questions.

## 2017-02-14 ENCOUNTER — Telehealth: Payer: Self-pay | Admitting: *Deleted

## 2017-02-14 MED ORDER — PRAVASTATIN SODIUM 40 MG PO TABS: 40.0000 mg | ORAL_TABLET | Freq: Every day | ORAL | 0 refills | Status: DC

## 2017-02-14 NOTE — Telephone Encounter (Signed)
Jeanett Schlein says that she has bee trying to get her pravastatin refilled and pharmacy has been sending requests.  I explained to her that all medications need to be filled by primary care now.  She has not see Dr Delfina Redwood and wants another PCP. I told her we would give her one courtesy refill but all must go to PCP in the future.

## 2017-02-18 NOTE — Progress Notes (Signed)
OT Note - Addendum    01-Jan-2017 1040  OT Visit Information  Last OT Received On 01/01/2017  OT G-codes **NOT FOR INPATIENT CLASS**  Self Care Goal Status (Q5848) CI  [facility], OT/L  [phone] 01/01/2017

## 2017-03-04 ENCOUNTER — Encounter: Payer: Self-pay | Admitting: Neurology

## 2017-03-04 ENCOUNTER — Ambulatory Visit (INDEPENDENT_AMBULATORY_CARE_PROVIDER_SITE_OTHER): Payer: Medicare Other | Admitting: Neurology

## 2017-03-04 VITALS — BP 162/84 | HR 72 | Ht 64.75 in | Wt 156.9 lb

## 2017-03-04 DIAGNOSIS — I10 Essential (primary) hypertension: Secondary | ICD-10-CM | POA: Diagnosis not present

## 2017-03-04 DIAGNOSIS — I639 Cerebral infarction, unspecified: Secondary | ICD-10-CM | POA: Diagnosis not present

## 2017-03-04 DIAGNOSIS — E785 Hyperlipidemia, unspecified: Secondary | ICD-10-CM | POA: Diagnosis not present

## 2017-03-04 DIAGNOSIS — I63332 Cerebral infarction due to thrombosis of left posterior cerebral artery: Secondary | ICD-10-CM | POA: Diagnosis not present

## 2017-03-04 NOTE — Progress Notes (Signed)
NEUROLOGY FOLLOW UP OFFICE NOTE  [name] [id number]  HISTORY OF PRESENT ILLNESS: [name]. [name] is an 83 year old woman with PTSD, depression, chronic subjective dizziness, chronic pain, IBS, MGUS, osteoporosis and history of breast cancer who follows up for stroke.  History supplemented by hospital notes.  She was admitted to the [facility] twice over the past 2 months for slurred speech and weakness.  She was admitted from 12/16/16 to 12/17/16 after presenting slurred speech and weakness.  She woke up that morning feeling weak, with difficulty getting herself out of bed and with trouble walking.  Later at physical therapy, it was noted that she had slurred speech.  In the ED, her systolic blood pressure was in the 200s.  CT and MRI of head revealed chronic small vessel disease but no acute infarct.  MRA of head revealed atherosclerotic stenosis of the left MCA M2 branches, right PCA P2 segment and left PCA P1 and P2 segments but no emergent large vessel occlusion.  MRA of neck revealed no hemodynamically significant stenosis.  2D echo revealed LV EF 65-70% with no cardiac source of emboli.  LDL was 109.  Hgb A1c was 6.4.  She was started on ASA 81mg  and pravastatin 40mg .  She was readmitted from 01/07/17 to 01/10/17 for onset of right sided arm and leg weakness and right sided facial, arm and leg numbness with slurred speech.  CT head demonstrated no acute findings but MRI of brain showed acute left thalamic ischemic stroke, close proximity to the posterior limb of the left internal capsule.  Due to intracranial stenosis, she was started on dual antiplatelet therapy with ASA and Plavix for 3 months.  She returned to the ED on 02/07/17 for 4 days on increased intermittent right sided tingling of her face and hand.   She has started to regain feeling in her right upper extremity.  She notes some pain in the right thumb.  Her blood pressure has remained  uncontrolled.  PAST MEDICAL HISTORY: Past Medical History:  Diagnosis Date  . Adenomatous colon polyp 04/2005  . Allergic    Anectine  . ANXIETY 10/13/2007  . BACK PAIN 03/01/2007  . BREAST CANCER, HX OF 12/06/2007  . Cancer [facility])    RIGHT mastectomy with node dissection  . Complication of anesthesia    family aggergy to annectine  . Compression fracture of L4 lumbar vertebra (HCC) 02/2013  . CONSTIPATION 01/06/2009  . DEPRESSION 10/13/2007  . Diverticular disease   . DIVERTICULOSIS, COLON 05/11/2005  . External hemorrhoids without mention of complication 08/24/5850  . FATIGUE 08/07/2008  . HEMORRHOIDS, INTERNAL 05/11/2005  . Hypercalcemia 04/23/2009  . HYPERLIPIDEMIA NEC/NOS 03/01/2007  . HYPERTENSION 10/13/2007  . Impaired fasting glucose 11/14/2008  . INTERSTITIAL CYSTITIS 09/06/2007  . Irritable bowel syndrome 01/06/2009  . LIVER MASS 05/15/2009  . OSTEOPOROSIS 05/09/2009  . PARESTHESIA 09/06/2007  . POLYCYTHEMIA 04/23/2009  . STENOSIS, SPINAL, UNSPC REGION 03/01/2007  . Thyroid disease     MEDICATIONS: Current Outpatient Prescriptions on File Prior to Visit  Medication Sig Dispense Refill  . Ascorbic Acid (VITAMIN C PO) Take 1 tablet by mouth daily as needed (when not eating enough fruit).    Marland Kitchen buPROPion (WELLBUTRIN XL) 300 MG 24 hr tablet Take 1 tablet (300 mg total) by mouth daily. 30 tablet 1  . diazepam (VALIUM) 5 MG tablet Take 1 tablet (5 mg total) by mouth daily as needed for anxiety. 30 tablet 0  . acetaminophen (  TYLENOL) 500 MG tablet Take 1,000 mg by mouth every 6 (six) hours as needed for mild pain.    Marland Kitchen Alum & Mag Hydroxide-Simeth (GELUSIL PO) Take 1 tablet by mouth daily as needed (acid reflux).    Marland Kitchen aspirin 81 MG chewable tablet Chew 1 tablet (81 mg total) by mouth daily. 30 tablet 0  . CALCIUM-MAGNESIUM PO Take 1 tablet by mouth daily.    . carisoprodol (SOMA) 350 MG tablet Take 1 tablet (350 mg total) by mouth daily as needed (muscle spasms). 30 tablet 0  . celecoxib  (CELEBREX) 200 MG capsule Take 1 capsule (200 mg total) by mouth daily as needed (arthritis pain). 30 capsule 0  . Cholecalciferol (VITAMIN D3) 1000 units CAPS Take 1 capsule (1,000 Units total) by mouth daily. 60 capsule 0  . clopidogrel (PLAVIX) 75 MG tablet Take 1 tablet (75 mg total) by mouth daily. 30 tablet 1  . Cyanocobalamin (VITAMIN B-12 PO) Take 1 tablet by mouth daily.    . diclofenac sodium (VOLTAREN) 1 % GEL Apply 2 g topically 4 (four) times daily. 1 Tube 0  . estradiol (ESTRACE) 0.1 MG/GM vaginal cream Place 1 Applicatorful vaginally daily as needed (for dryness).     . fluticasone (FLONASE) 50 MCG/ACT nasal spray Place 2 sprays into both nostrils daily. (Patient taking differently: Place 2 sprays into both nostrils daily as needed for allergies. ) 16 g 2  . hydrOXYzine (VISTARIL) 25 MG capsule Take 1 capsule (25 mg total) by mouth at bedtime. 30 capsule 0  . magic mouthwash w/lidocaine SOLN Take 5 mLs by mouth 3 (three) times daily. 250 mL 0  . metoprolol tartrate (LOPRESSOR) 25 MG tablet Take 1 tablet (25 mg total) by mouth 2 (two) times daily. 60 tablet 1  . Multiple Vitamin (MULTIVITAMIN WITH MINERALS) TABS tablet Take 1 tablet by mouth daily as needed (when not eating well).    . NONFORMULARY OR COMPOUNDED ITEM Equal parts Nystatin 100,000 units/ml and viscous lidocaine 2%.  Swish 15 ml and spit q 6 hour prn disp 250 ml 0 RF 250 each 0  . oxyCODONE (OXY IR/ROXICODONE) 5 MG immediate release tablet Take 1 tablet (5 mg total) by mouth daily as needed for moderate pain or severe pain. 20 tablet 0  . polyethylene glycol (MIRALAX / GLYCOLAX) packet Take 17 g by mouth daily. 14 each 0  . pravastatin (PRAVACHOL) 40 MG tablet Take 1 tablet (40 mg total) by mouth daily at 6 PM. 30 tablet 0  . VITAMIN E PO Take 1 capsule by mouth daily.    . Zoledronic Acid (RECLAST IV) Inject into the vein. Yearly injection - last injection March 2018     No current facility-administered medications on  file prior to visit.     ALLERGIES: Allergies  Allergen Reactions  . Anectine [Succinylcholine Chloride] Other (See Comments)    Reaction:  Patient states that her body wasn't functioning.  Pt states she was put on life support.   . Dilaudid [Hydromorphone Hcl] Other (See Comments)    Slept for 3 days and 3 nights  . Hydrocodone-Acetaminophen Other (See Comments)    Hallucinations, paranoia, amnesia Pt takes Tylenol at home  . Iodine Hives and Other (See Comments)    , asthma  . Naproxen Sodium Other (See Comments)    REACTION: Asthma  . Oxycodone Hcl Other (See Comments)     Hallucinations, paranoia, amnesia Pt takes Percocet at home with no issues  . Pentosan Polysulfate Sodium Other (  See Comments)    REACTION: unspecified per patient  . Pseudoephedrine Other (See Comments)     Palpitations  . Succinylcholine     REACTION: "Can't wake up".  . Tape Dermatitis    Red inflammed  . Tramadol Hcl Other (See Comments)    Unknown reaction  . Shellfish Allergy     Runny nose, cough, hoarseness    FAMILY HISTORY: Family History  Problem Relation Age of Onset  . Heart disease Sister   . Hypertension Sister   . Asthma Sister   . Breast cancer Maternal Aunt   . Ovarian cancer Paternal Aunt   . Colon cancer Neg Hx   . Esophageal cancer Neg Hx   . Pancreatic cancer Neg Hx   . Rectal cancer Neg Hx   . Stomach cancer Neg Hx     SOCIAL HISTORY: Social History   Social History  . Marital status: Married    Spouse name: N/A  . Number of children: 0  . Years of education: N/A   Occupational History  . Not on file.   Social History Main Topics  . Smoking status: Former Smoker    Packs/day: 1.00    Years: 42.00    Types: Cigarettes    Quit date: 08/23/1977  . Smokeless tobacco: Never Used  . Alcohol use No  . Drug use: No  . Sexual activity: Not on file   Other Topics Concern  . Not on file   Social History Narrative  . No narrative on file    REVIEW OF  SYSTEMS: Constitutional: No fevers, chills, or sweats, no generalized fatigue, change in appetite Eyes: No visual changes, double vision, eye pain Ear, nose and throat: No hearing loss, ear pain, nasal congestion, sore throat Cardiovascular: No chest pain, palpitations Respiratory:  No shortness of breath at rest or with exertion, wheezes GastrointestinaI: No nausea, vomiting, diarrhea, abdominal pain, fecal incontinence Genitourinary:  No dysuria, urinary retention or frequency Musculoskeletal:  No neck pain, back pain Integumentary: No rash, pruritus, skin lesions Neurological: as above Psychiatric: No depression, insomnia, anxiety Endocrine: No palpitations, fatigue, diaphoresis, mood swings, change in appetite, change in weight, increased thirst Hematologic/Lymphatic:  No purpura, petechiae. Allergic/Immunologic: no itchy/runny eyes, nasal congestion, recent allergic reactions, rashes  PHYSICAL EXAM: Vitals:   03/04/17 1117  BP: (!) 162/84  Pulse: 72   General: No acute distress.  Patient appears well-groomed.  Head:  Normocephalic/atraumatic Eyes:  Fundi examined but not visualized Neck: supple, no paraspinal tenderness, full range of motion Heart:  Regular rate and rhythm Lungs:  Clear to auscultation bilaterally Back: No paraspinal tenderness Neurological Exam: alert and oriented to person, place, and time. Attention span and concentration intact, recent and remote memory intact, fund of knowledge intact.  Speech fluent and not dysarthric, language intact.  Decreased right V1-V3 sensation.  Otherwise, CN II-XII intact. Bulk and tone normal, muscle strength 5/5 throughout.  Sensation to light touch, temperature and vibration intact.  Deep tendon reflexes 2+ throughout, toes downgoing.  Finger to nose and heel to shin testing intact.  Wide-based gait with short steps.  Unsteady when turns.  Unable to tandem walk.  Romberg with sway.  IMPRESSION: Left thalamic stroke secondary to  small vessel disease Hypertension Hyperlipidemia  PLAN: 1.  Due to intracranial arterial stenosis, she was instructed to continue dual antiplatelet therapy until 04/09/17, at which point she may continue Plavix and discontinue ASA. 2.  Pravachol 40mg  daily.  Repeat lipid panel in one month.  (LDL  goal less than 70) 3.  Instructed to follow up with Dr. Delfina Redwood regarding elevated blood pressure.  At her request, we contacted his office to let them know. 4.  Follow up in 4 months.  25 minutes spent face to face with patient, over 50% spent discussing diagnosis and management.  Metta Clines, DO  CC:  Seward Carol, MD

## 2017-03-04 NOTE — Patient Instructions (Addendum)
1.  Remain on aspirin 81mg  daily and clopidogrel (Plavix) 75mg  daily.  On 04/09/17, stop the aspirin and remain on clopidogrel (Plavix) 75mg  daily. 2.  Continue pravastatin (Pravachol) 40mg  daily.  Repeat lipid panel in one month. 3.  Follow up with Dr. Delfina Redwood regarding your high blood pressure. 4.  Follow up in 4 months.

## 2017-03-08 ENCOUNTER — Ambulatory Visit (HOSPITAL_BASED_OUTPATIENT_CLINIC_OR_DEPARTMENT_OTHER): Payer: Medicare Other | Admitting: Physical Medicine & Rehabilitation

## 2017-03-08 ENCOUNTER — Encounter: Payer: Medicare Other | Attending: Physical Medicine & Rehabilitation

## 2017-03-08 ENCOUNTER — Encounter: Payer: Self-pay | Admitting: Physical Medicine & Rehabilitation

## 2017-03-08 VITALS — BP 171/90 | HR 76 | Resp 14

## 2017-03-08 DIAGNOSIS — E785 Hyperlipidemia, unspecified: Secondary | ICD-10-CM | POA: Insufficient documentation

## 2017-03-08 DIAGNOSIS — M810 Age-related osteoporosis without current pathological fracture: Secondary | ICD-10-CM | POA: Diagnosis not present

## 2017-03-08 DIAGNOSIS — R209 Unspecified disturbances of skin sensation: Secondary | ICD-10-CM

## 2017-03-08 DIAGNOSIS — I639 Cerebral infarction, unspecified: Secondary | ICD-10-CM

## 2017-03-08 DIAGNOSIS — R6889 Other general symptoms and signs: Secondary | ICD-10-CM | POA: Insufficient documentation

## 2017-03-08 DIAGNOSIS — G8929 Other chronic pain: Secondary | ICD-10-CM | POA: Insufficient documentation

## 2017-03-08 DIAGNOSIS — I69398 Other sequelae of cerebral infarction: Secondary | ICD-10-CM | POA: Diagnosis present

## 2017-03-08 DIAGNOSIS — I10 Essential (primary) hypertension: Secondary | ICD-10-CM | POA: Insufficient documentation

## 2017-03-08 DIAGNOSIS — Z825 Family history of asthma and other chronic lower respiratory diseases: Secondary | ICD-10-CM | POA: Diagnosis not present

## 2017-03-08 DIAGNOSIS — I63212 Cerebral infarction due to unspecified occlusion or stenosis of left vertebral arteries: Secondary | ICD-10-CM

## 2017-03-08 DIAGNOSIS — I6322 Cerebral infarction due to unspecified occlusion or stenosis of basilar arteries: Secondary | ICD-10-CM

## 2017-03-08 DIAGNOSIS — Z7982 Long term (current) use of aspirin: Secondary | ICD-10-CM | POA: Insufficient documentation

## 2017-03-08 DIAGNOSIS — I6381 Other cerebral infarction due to occlusion or stenosis of small artery: Secondary | ICD-10-CM

## 2017-03-08 DIAGNOSIS — Z87891 Personal history of nicotine dependence: Secondary | ICD-10-CM | POA: Insufficient documentation

## 2017-03-08 DIAGNOSIS — D751 Secondary polycythemia: Secondary | ICD-10-CM | POA: Diagnosis not present

## 2017-03-08 DIAGNOSIS — Z8673 Personal history of transient ischemic attack (TIA), and cerebral infarction without residual deficits: Secondary | ICD-10-CM | POA: Diagnosis not present

## 2017-03-08 DIAGNOSIS — R269 Unspecified abnormalities of gait and mobility: Secondary | ICD-10-CM | POA: Insufficient documentation

## 2017-03-08 DIAGNOSIS — Z853 Personal history of malignant neoplasm of breast: Secondary | ICD-10-CM | POA: Insufficient documentation

## 2017-03-08 DIAGNOSIS — F329 Major depressive disorder, single episode, unspecified: Secondary | ICD-10-CM | POA: Diagnosis not present

## 2017-03-08 DIAGNOSIS — K589 Irritable bowel syndrome without diarrhea: Secondary | ICD-10-CM | POA: Insufficient documentation

## 2017-03-08 DIAGNOSIS — I69319 Unspecified symptoms and signs involving cognitive functions following cerebral infarction: Secondary | ICD-10-CM | POA: Insufficient documentation

## 2017-03-08 DIAGNOSIS — F419 Anxiety disorder, unspecified: Secondary | ICD-10-CM | POA: Diagnosis not present

## 2017-03-08 DIAGNOSIS — Z8249 Family history of ischemic heart disease and other diseases of the circulatory system: Secondary | ICD-10-CM | POA: Diagnosis not present

## 2017-03-08 DIAGNOSIS — R42 Dizziness and giddiness: Secondary | ICD-10-CM | POA: Diagnosis not present

## 2017-03-08 NOTE — Patient Instructions (Signed)
No driving No lawn mowing

## 2017-03-08 NOTE — Progress Notes (Addendum)
Subjective:    Patient ID: [name], female    DOB: 05-Mar-1930, 83 y.o.   MRN: [id number] 83 year old right-handed female history of polycythemia, right breast cancer, hypertension, hyperlipidemia, and TIA, maintained on aspirin, who lives alone, independent with assistive device prior to admission. She does of a niece close by. Presented on Jan 07, 2017, with acute right-sided weakness and slurred speech. CT showed acute left thalamus CVA. MRI, acute ischemia within the left thalamus, no hemorrhage or mass effect. Echocardiogram with diastolic dysfunction, but otherwise normal MRA with moderate to severe stenosis, left MCA M2 branches. The patient did not receive tPA, started on dual anti-platelet therapy, aspirin and Plavix. DATE OF ADMISSION: 01/10/2017 DATE OF DISCHARGE: 01/18/2017 HPI Finishing Home health PT, OT. Patient is able to dress and bathe herself. She ambulates with a walker. I asked her about her neurology visit with Dr. Tomi Likens, and the patient did not remember this. I showed her the office visit on the Epic screen and she insisted that she did not see the doctor, although was clearly a neurology evaluation performed 4 days ago. She denies any falls.  Her main concern is that she wants to return to driving, as well as mowing her lawn with a riding mower. Patient insists that even though she has difficulty standing and with her balance and with dizziness that she can drive just fine  Patient had ED visit where she was mainly discussing the merits of brand name versus generic medications with the ED physician and was told to follow-up with PCP.  The patient states that she wants to get another PCP and therefore has not followed up. She asked for some suggestions of PCP is close to her home and I made a suggestion to go to  [facility] but she states that she had been there and did not wish to go there again.  She would like to review her  medications with me, but as we discussed last visit only provided prescriptions from her discharge from the hospital and that her primary care and neurologist were prescribing her medications.    Pain Inventory Average Pain 5 Pain Right Now 7 My pain is stabbing  In the last 24 hours, has pain interfered with the following? General activity 2 Relation with others 8 Enjoyment of life 10 What TIME of day is your pain at its worst? morning Sleep (in general) Fair  Pain is worse with: walking, bending, sitting, standing and some activites Pain improves with: medication Relief from Meds: no selection  Mobility walk with assistance use a walker  Function retired  Neuro/Psych weakness trouble walking confusion  Prior Studies Any changes since last visit?  no  Physicians involved in your care Any changes since last visit?  no   Family History  Problem Relation Age of Onset  . Heart disease Sister   . Hypertension Sister   . Asthma Sister   . Breast cancer Maternal Aunt   . Ovarian cancer Paternal Aunt   . Colon cancer Neg Hx   . Esophageal cancer Neg Hx   . Pancreatic cancer Neg Hx   . Rectal cancer Neg Hx   . Stomach cancer Neg Hx    Social History   Social History  . Marital status: Married    Spouse name: N/A  . Number of children: 0  . Years of education: N/A   Social History Main Topics  . Smoking status: Former Smoker    Packs/day: 1.00  Years: 42.00    Types: Cigarettes    Quit date: 08/23/1977  . Smokeless tobacco: Never Used  . Alcohol use No  . Drug use: No  . Sexual activity: Not Asked   Other Topics Concern  . None   Social History Narrative  . None   Past Surgical History:  Procedure Laterality Date  . ABDOMINAL HYSTERECTOMY    . BACK SURGERY    . BREAST SURGERY     mastectomy -right  . CERVICAL SPINE SURGERY    . CHOLECYSTECTOMY    . COLONOSCOPY W/ BIOPSIES    . cortisone injection  10/16/12   left knee  . FIXATION  KYPHOPLASTY LUMBAR SPINE    . HEMORRHOID BANDING  2015-16  . PARATHYROID EXPLORATION  06/29/2011   Procedure: PARATHYROID EXPLORATION;  Surgeon: Earnstine Regal, MD;  Location: [facility];  Service: General;  Laterality: Left;  Left Superior Parathyroidectomy  . PARATHYROIDECTOMY  06/29/11   Past Medical History:  Diagnosis Date  . Adenomatous colon polyp 04/2005  . Allergic    Anectine  . ANXIETY 10/13/2007  . BACK PAIN 03/01/2007  . BREAST CANCER, HX OF 12/06/2007  . Cancer [facility])    RIGHT mastectomy with node dissection  . Complication of anesthesia    family aggergy to annectine  . Compression fracture of L4 lumbar vertebra (HCC) 02/2013  . CONSTIPATION 01/06/2009  . DEPRESSION 10/13/2007  . Diverticular disease   . DIVERTICULOSIS, COLON 05/11/2005  . External hemorrhoids without mention of complication 02/21/6377  . FATIGUE 08/07/2008  . HEMORRHOIDS, INTERNAL 05/11/2005  . Hypercalcemia 04/23/2009  . HYPERLIPIDEMIA NEC/NOS 03/01/2007  . HYPERTENSION 10/13/2007  . Impaired fasting glucose 11/14/2008  . INTERSTITIAL CYSTITIS 09/06/2007  . Irritable bowel syndrome 01/06/2009  . LIVER MASS 05/15/2009  . OSTEOPOROSIS 05/09/2009  . PARESTHESIA 09/06/2007  . POLYCYTHEMIA 04/23/2009  . STENOSIS, SPINAL, UNSPC REGION 03/01/2007  . Thyroid disease    BP (!) 171/90 (BP Location: Right Arm, Patient Position: Sitting, Cuff Size: Normal)   Pulse 76   Resp 14   SpO2 94%   Opioid Risk Score:   Fall Risk Score:  `1  Depression screen PHQ 2/9  Depression screen [facility] 2/9 06/23/2012 02/02/2012 02/02/2012  Decreased Interest 1 1 1   Down, Depressed, Hopeless 1 1 1   PHQ - 2 Score 2 2 2   Some recent data might be hidden    Review of Systems     Objective:   Physical Exam  Constitutional: She is oriented to person, place, and time. She appears well-developed and well-nourished.  HENT:  Head: Normocephalic and atraumatic.  Eyes: Pupils are equal, round, and reactive to light. Conjunctivae and EOM are normal.    Neurological: She is alert and oriented to person, place, and time.  Psychiatric: Her affect is inappropriate. Her speech is tangential. She expresses inappropriate judgment. She exhibits abnormal recent memory.  Patient wanders off subject in her conversation She is inattentive.  Nursing note and vitals reviewed.  Sensation reduced right thumb compared to other fingers. Her motor strength is 4/5 in the right deltoid, bicep, tricep, grip, hip flexion, knee extension and dorsiflexion. Her standing balance is fair Dynamic standing balance is poor. She tends to lean backward with a walker. She has a widened base of support.  Right thumb. No tenderness over the PIP or MCP. No pain over the top. No discoloration over the nailbed. No pain with range of motion of the PIP or MCP. No joint swelling  Assessment & Plan:  1. History of left thalamic infarct. She has right hemiparesis as well as balance disorder. Patient also exhibiting poor memory and trying to compensate for this. I do think she would benefit from neuropsychology evaluation. Her MRI also shows diffuse periventricular white matter micro vascular disease that may be the etiology of her cognitive deficits.  2. Thumb pain. This is most likely dysesthetic from the stroke. Thumb exam is totally normal do not think x-rays are indicated  Follow-up in 6 weeks.

## 2017-03-08 NOTE — Addendum Note (Signed)
Addended by: Charlett Blake on: 03/08/2017 04:17 PM   Modules accepted: Orders

## 2017-03-09 ENCOUNTER — Encounter: Payer: Self-pay | Admitting: *Deleted

## 2017-03-09 NOTE — Telephone Encounter (Signed)
error 

## 2017-03-10 ENCOUNTER — Encounter: Payer: Self-pay | Admitting: Occupational Therapy

## 2017-03-10 ENCOUNTER — Ambulatory Visit: Payer: Medicare Other | Admitting: Occupational Therapy

## 2017-03-10 ENCOUNTER — Ambulatory Visit: Payer: Medicare Other | Attending: Physical Medicine & Rehabilitation

## 2017-03-10 VITALS — BP 158/80

## 2017-03-10 DIAGNOSIS — R2689 Other abnormalities of gait and mobility: Secondary | ICD-10-CM | POA: Diagnosis present

## 2017-03-10 DIAGNOSIS — R41841 Cognitive communication deficit: Secondary | ICD-10-CM | POA: Insufficient documentation

## 2017-03-10 DIAGNOSIS — R2681 Unsteadiness on feet: Secondary | ICD-10-CM | POA: Diagnosis present

## 2017-03-10 DIAGNOSIS — R41844 Frontal lobe and executive function deficit: Secondary | ICD-10-CM | POA: Insufficient documentation

## 2017-03-10 DIAGNOSIS — R42 Dizziness and giddiness: Secondary | ICD-10-CM

## 2017-03-10 DIAGNOSIS — I69351 Hemiplegia and hemiparesis following cerebral infarction affecting right dominant side: Secondary | ICD-10-CM | POA: Insufficient documentation

## 2017-03-10 NOTE — Therapy (Signed)
[facility] [address] [place], [place], [address] Phone: [phone]   Fax:  [phone]  Physical Therapy Evaluation  Patient Details  Name: [name] MRN: [id number] Date of Birth: [birth year] Referring Provider: Dr. Letta Pate  Encounter Date: 03/10/2017      PT End of Session - 03/10/17 1147    Visit Number 1   Number of Visits 9   Date for PT Re-Evaluation 04/09/17   Authorization Type Medicare and BCBS Federal: G-CODE AND PN EVERY 10TH VISIT.    PT Start Time 1103  pt with OT   PT Stop Time 1140   PT Time Calculation (min) 37 min   Equipment Utilized During Treatment --  min guard to min A prn   Activity Tolerance Patient tolerated treatment well   Behavior During Therapy Impulsive  decr. safey awareness      Past Medical History:  Diagnosis Date  . Adenomatous colon polyp 04/2005  . Allergic    Anectine  . ANXIETY 10/13/2007  . BACK PAIN 03/01/2007  . BREAST CANCER, HX OF 12/06/2007  . Cancer [facility])    RIGHT mastectomy with node dissection  . Complication of anesthesia    family aggergy to annectine  . Compression fracture of L4 lumbar vertebra (HCC) 02/2013  . CONSTIPATION 01/06/2009  . DEPRESSION 10/13/2007  . Diverticular disease   . DIVERTICULOSIS, COLON 05/11/2005  . External hemorrhoids without mention of complication 4/40/3474  . FATIGUE 08/07/2008  . HEMORRHOIDS, INTERNAL 05/11/2005  . Hypercalcemia 04/23/2009  . HYPERLIPIDEMIA NEC/NOS 03/01/2007  . HYPERTENSION 10/13/2007  . Impaired fasting glucose 11/14/2008  . INTERSTITIAL CYSTITIS 09/06/2007  . Irritable bowel syndrome 01/06/2009  . LIVER MASS 05/15/2009  . OSTEOPOROSIS 05/09/2009  . PARESTHESIA 09/06/2007  . POLYCYTHEMIA 04/23/2009  . STENOSIS, SPINAL, UNSPC REGION 03/01/2007  . Thyroid disease     Past Surgical History:  Procedure Laterality Date  . ABDOMINAL HYSTERECTOMY    . BACK SURGERY    . BREAST SURGERY     mastectomy  -right  . CERVICAL SPINE SURGERY    . CHOLECYSTECTOMY    . COLONOSCOPY W/ BIOPSIES    . cortisone injection  10/16/12   left knee  . FIXATION KYPHOPLASTY LUMBAR SPINE    . HEMORRHOID BANDING  2015-16  . PARATHYROID EXPLORATION  06/29/2011   Procedure: PARATHYROID EXPLORATION;  Surgeon: Earnstine Regal, MD;  Location: [facility];  Service: General;  Laterality: Left;  Left Superior Parathyroidectomy  . PARATHYROIDECTOMY  06/29/11    There were no vitals filed for this visit.       Subjective Assessment - 03/10/17 1109    Subjective Pt's caregiver, [name], assisted in providing hx 2/2 cognitive deficits after CVA. Pt reports she is more "wobbly" since CVA and the L LE functions better than the R LE. Pt has not been outside, as bending forward causing LOB (while pulling weeds). At end of session pt reported she has dizziness in the morning, especially in the morning. Pt describes dizziness as unsteady/wooziness.   Patient is accompained by: --  caregiver: [name]   Pertinent History HTN, interstitial cystitis, HLD, hyperparathyroidism, cognitive deficits from CVA, hx of R breast CA s/p mastectomy, R hand parasthesia after CVA, osteoporosis, depression, anxiety   Patient Stated Goals Get my balance back.    Currently in Pain? Yes   Pain Score 8    Pain Location Hand   Pain Orientation Right   Pain Descriptors / Indicators Pins and needles;Burning;Tingling  Pain Type Acute pain   Pain Onset 1 to 4 weeks ago   Pain Frequency Constant   Aggravating Factors  using my hand makes it worse   Pain Relieving Factors not using it. heat            [facility] PT Assessment - 03/10/17 1112      Assessment   Medical Diagnosis Gait disturbance, post-stroke   Referring Provider Dr. Letta Pate   Onset Date/Surgical Date 01/07/17   Hand Dominance Right   Prior Therapy OPPT for balance     Precautions   Precautions Fall     Restrictions   Weight Bearing Restrictions No     Balance Screen   Has  the patient fallen in the past 6 months Yes   How many times? at least 2-3 falls   Has the patient had a decrease in activity level because of a fear of falling?  Yes   Is the patient reluctant to leave their home because of a fear of falling?  No     Home Ecologist residence   Living Arrangements Other (Comment);Alone  (9-3pm caregiver present)   Available Help at Discharge Personal care attendant   Type of [place] to enter   Entrance Stairs-Number of Steps 2  uses the most and 5-6 steps on entrance she rarely uses   Entrance Stairs-Rails Can reach both   Home Layout Two level;Able to live on main level with bedroom/bathroom   Alternate Level Stairs-Number of Steps 16   Alternate Level Stairs-Rails Right   Home Equipment Walker - 4 wheels;Cane - quad;Shower seat - built in     Prior Function   Level of Independence Independent   Leisure work in [employer], reading     [employer] Impaired  see OT eval     Sensation   Light Touch Impaired by gross assessment   Additional Comments R hand N/T per pt report. RLE decr. light touch.     Coordination   Gross Motor Movements are Fluid and Coordinated No   Fine Motor Movements are Fluid and Coordinated Yes   Coordination and Movement Description Decr. speed and accuracy during finger to thumb, and heel to shin     Tone   Assessment Location Right Lower Extremity;Left Lower Extremity     ROM / Strength   AROM / PROM / Strength AROM;Strength     AROM   Overall AROM  Within functional limits for tasks performed   Overall AROM Comments BLE WFL     Strength   Overall Strength Deficits   Overall Strength Comments RLE grossly: 4-/5 and LLE grossly: 4+-5/5. B hip extension not formally tested but weakness suspected, 2/2 pt's gait impairments.     Transfers   Transfers Sit to Stand;Stand to Sit   Sit to Stand 5: Supervision;Without upper extremity assist;From chair/3-in-1    Stand to Sit 5: Supervision;Without upper extremity assist;To chair/3-in-1     Ambulation/Gait   Ambulation/Gait Yes   Ambulation/Gait Assistance 4: Min assist;4: Min guard   Ambulation/Gait Assistance Details Cues to improve R heel strike, narrow BOS and weight shifting while performing turns.    Ambulation Distance (Feet) 200 Feet   Assistive device None  as pt carries SBQC vs. using it   Gait Pattern Step-through pattern;Decreased stride length;Decreased dorsiflexion - right;Decreased arm swing - left;Decreased arm swing - right;Narrow base of support   Ambulation Surface Level;Indoor  Gait velocity 2.41ft/sec. no AD     Standardized Balance Assessment   Standardized Balance Assessment Dynamic Gait Index;Timed Up and Go Test     Dynamic Gait Index   Level Surface Moderate Impairment   Change in Gait Speed Mild Impairment   Gait with Horizontal Head Turns Moderate Impairment   Gait with Vertical Head Turns Mild Impairment   Gait and Pivot Turn Severe Impairment   Step Over Obstacle Mild Impairment   Step Around Obstacles Mild Impairment   Steps Moderate Impairment   Total Score 11     Timed Up and Go Test   TUG Normal TUG;Cognitive TUG   Normal TUG (seconds) 9.7  no AD   Cognitive TUG (seconds) 14.7  no AD and pt had difficulty following commands   TUG Comments Pt unable to count backwards by 3's, she counted backwards by 1 and then started to count up again to 100 and then back down.     RLE Tone   RLE Tone Within Functional Limits     LLE Tone   LLE Tone Within Functional Limits            Objective measurements completed on examination: See above findings.                  PT Education - 03/10/17 1147    Education provided Yes   Education Details PT discussed outcome measure results, PT POC, frequency, and duration. PT discussed the importance of using AD to decr. falls risk.    Person(s) Educated Patient;Caregiver(s)   Methods Explanation    Comprehension Verbalized understanding;Need further instruction          PT Short Term Goals - 03/10/17 1200      PT SHORT TERM GOAL #1   Title same as LTGs           PT Long Term Goals - 03/10/17 1200      PT LONG TERM GOAL #1   Title Pt will perform HEP at MOD I level with caregiver's assist to improve balance, strength, dizziness, and safety. TARGET DATE FOR ALL LTGS: 04/07/17   Status New     PT LONG TERM GOAL #2   Title Pt will improve DGI score to >/=15/24 to decr. falls risk.    Status New     PT LONG TERM GOAL #3   Title Pt will amb. 500' over even/uneven terrain with LRAD at MOD I level to improve functional mobility.    Status New     PT LONG TERM GOAL #4   Title Perform vestibular assessment and write goals prn.   Status New                Plan - 03/10/17 1148    Clinical Impression Statement Pt is an 83y/o female presenting to OPPT neuro s/p CVA with balance issues. Pt's PMH significant for the following: HTN, interstitial cystitis, HLD, hyperparathyroidism, cognitive deficits from CVA, hx of R breast CA s/p mastectomy, R hand parasthesia after CVA, osteoporosis, depression, anxiety. The following deficits were note during exam: gait deviations, impaired balance, cognitive deficits, decr. safety awareness, dizziness per pt report, impaired coordination, decr. flexibility, and decr. strength. Pt's gait speed WFL, however, pt unsteady during gait. Pt's DGI score indicates pt is at risk for falls. Pt presents with Parkinson's like gait pattern, festinating, shuffle, der. arm swing/trunk rotation, which caregiver reported occured prior to CVA, PT will monitor. Pt's TUG time WNL, however, pt unable to  follow commands to perform cognitive TUG and noted to experience incr. postural sway during cognitive TUG. PT will trial therapy to improve strength, balance, and functional mobility for 4 weeks and renew if appropriate at end of POC.    History and Personal  Factors relevant to plan of care: Pt lives alone and has limited family support. Caregiver presents 5 days/week from (9-3pm)   Clinical Presentation due to: HTN, interstitial cystitis, HLD, hyperparathyroidism, cognitive deficits from CVA, hx of R breast CA s/p mastectomy, R hand parasthesia after CVA, osteoporosis, depression, anxiety   Rehab Potential Fair  based on cognition   Clinical Impairments Affecting Rehab Potential see above   PT Frequency 2x / week   PT Duration 4 weeks   PT Treatment/Interventions ADLs/Self Care Home Management;Biofeedback;Canalith Repostioning;Electrical Stimulation;Cognitive remediation;Neuromuscular re-education;Balance training;Therapeutic exercise;Therapeutic activities;Functional mobility training;Stair training;Gait training;DME Instruction;Orthotic Fit/Training;Patient/family education;Vestibular   PT Next Visit Plan Initiate OTAGO HEP.   Consulted and Agree with Plan of Care Patient;Family member/caregiver   Family Member Consulted caregiver-[name]      Patient will benefit from skilled therapeutic intervention in order to improve the following deficits and impairments:  Abnormal gait, Decreased endurance, Decreased knowledge of use of DME, Decreased balance, Decreased mobility, Dizziness, Decreased coordination, Decreased safety awareness, Impaired flexibility, Postural dysfunction, Decreased strength, Pain, Impaired sensation (PT will monitor pain but will not directly address)  Visit Diagnosis: Other abnormalities of gait and mobility - Plan: PT plan of care cert/re-cert  Hemiplegia and hemiparesis following cerebral infarction affecting right dominant side ([place]) - Plan: PT plan of care cert/re-cert  Dizziness and giddiness - Plan: PT plan of care cert/re-cert  Unsteadiness on feet - Plan: PT plan of care cert/re-cert      G-Codes - 56/21/30 Dec 05, 1200    Functional Assessment Tool Used (Outpatient Only) DGI: 11/24   Functional Limitation Mobility:  Walking and moving around   Mobility: Walking and Moving Around Current Status [phone]) At least 40 percent but less than 60 percent impaired, limited or restricted   Mobility: Walking and Moving Around Goal Status [phone]) At least 1 percent but less than 20 percent impaired, limited or restricted       Problem List Patient Active Problem List   Diagnosis Date Noted  . Cognitive deficit due to recent stroke 03/08/2017  . Alteration of sensation as late effect of stroke 03/08/2017  . Gait disturbance, post-stroke 03/08/2017  . Acute arterial ischemic stroke, vertebrobasilar, thalamic, left ([place]) 01/10/2017  . Neurologic gait disorder   . Depression   . Benign essential HTN   . History of breast cancer   . Stroke (cerebrum) ([place]) 01/08/2017  . TIA (transient ischemic attack) 12/16/2016  . Dysarthria 12/16/2016  . Leg weakness 12/16/2016  . EKG abnormalities 12/16/2016  . Chronic pain 12/16/2016  . Elevated blood pressure 12/22/2014  . Internal hemorrhoids with bleeding and prolapse 11/04/2014  . Lactose intolerance 11/04/2014  . Hemorrhoid 09/11/2014  . Rectal bleeding 09/11/2014  . IBS (irritable bowel syndrome) 09/25/2013  . Hyperparathyroidism, primary ([place]) 05/26/2011  . Osteoporosis 05/09/2009  . POLYCYTHEMIA 04/23/2009  . IMPAIRED FASTING GLUCOSE 11/14/2008  . BREAST CANCER, HX OF 12/06/2007  . Anxiety state 10/13/2007  . Essential hypertension 10/13/2007  . INTERSTITIAL CYSTITIS 09/06/2007  . HLD (hyperlipidemia) 03/01/2007  . Backache 03/01/2007    Katelinn Justice L 03/10/2017, 12:03 PM  [facility] [address] [place], [place], [address] Phone: [phone]   Fax:  [phone]  Name: LEXINGTON DEVINE MRN: [id number]  Date of Birth: 1930-05-05  Geoffry Paradise, PT,DPT 03/10/17 12:03 PM Phone: [phone] Fax: [phone]

## 2017-03-10 NOTE — Therapy (Signed)
[facility] [address] [place], [place], [address] Phone: [phone]   Fax:  [phone]  Occupational Therapy Evaluation  Patient Details  Name: [name] MRN: [id number] Date of Birth: [birth year] Referring Provider: Dr. Letta Pate  Encounter Date: 03/10/2017      OT End of Session - 03/10/17 1259    Visit Number 1   Number of Visits 1   Date for OT Re-Evaluation --  n/a   Authorization Type medicare   OT Start Time 1018   OT Stop Time 1100   OT Time Calculation (min) 42 min   Activity Tolerance Patient tolerated treatment well      Past Medical History:  Diagnosis Date  . Adenomatous colon polyp 04/2005  . Allergic    Anectine  . ANXIETY 10/13/2007  . BACK PAIN 03/01/2007  . BREAST CANCER, HX OF 12/06/2007  . Cancer [facility])    RIGHT mastectomy with node dissection  . Complication of anesthesia    family aggergy to annectine  . Compression fracture of L4 lumbar vertebra (HCC) 02/2013  . CONSTIPATION 01/06/2009  . DEPRESSION 10/13/2007  . Diverticular disease   . DIVERTICULOSIS, COLON 05/11/2005  . External hemorrhoids without mention of complication 1/32/4401  . FATIGUE 08/07/2008  . HEMORRHOIDS, INTERNAL 05/11/2005  . Hypercalcemia 04/23/2009  . HYPERLIPIDEMIA NEC/NOS 03/01/2007  . HYPERTENSION 10/13/2007  . Impaired fasting glucose 11/14/2008  . INTERSTITIAL CYSTITIS 09/06/2007  . Irritable bowel syndrome 01/06/2009  . LIVER MASS 05/15/2009  . OSTEOPOROSIS 05/09/2009  . PARESTHESIA 09/06/2007  . POLYCYTHEMIA 04/23/2009  . STENOSIS, SPINAL, UNSPC REGION 03/01/2007  . Thyroid disease     Past Surgical History:  Procedure Laterality Date  . ABDOMINAL HYSTERECTOMY    . BACK SURGERY    . BREAST SURGERY     mastectomy -right  . CERVICAL SPINE SURGERY    . CHOLECYSTECTOMY    . COLONOSCOPY W/ BIOPSIES    . cortisone injection  10/16/12   left knee  . FIXATION KYPHOPLASTY LUMBAR SPINE    . HEMORRHOID  BANDING  2015-16  . PARATHYROID EXPLORATION  06/29/2011   Procedure: PARATHYROID EXPLORATION;  Surgeon: Earnstine Regal, MD;  Location: [facility];  Service: General;  Laterality: Left;  Left Superior Parathyroidectomy  . PARATHYROIDECTOMY  06/29/11    Vitals:   03/10/17 1021  BP: (!) 158/80        Subjective Assessment - 03/10/17 1021    Patient is accompained by: --  "friend" attended with pt's pernmission   Pertinent History see epic; pt with L thalamic stroke 01/07/2017.  Pt reports that she has been "dizzy" for the last 10 years, had gait impairment and friend agrees that gait looks about the same as before the stroke.     Patient Stated Goals I want this dizziness to get better.    Currently in Pain? Yes   Pain Score 8    Pain Location Hand   Pain Orientation Right   Pain Descriptors / Indicators Pins and needles;Burning;Tingling   Pain Type Acute pain   Pain Onset 1 to 4 weeks ago   Pain Frequency Constant   Aggravating Factors  using my hand makes it worse   Pain Relieving Factors not using it.  heat helps temporarily   Effect of Pain on Daily Activities hard to use  my hand .            [facility] OT Assessment - 03/10/17 0001      Assessment  Diagnosis L thalmic CVA   Referring Provider Dr. Letta Pate   Onset Date 01/07/17   Prior Therapy pt had inpt PT, OT and ST  HHPT and OT     Precautions   Precautions Fall     Restrictions   Weight Bearing Restrictions No     Balance Screen   Has the patient fallen in the past 6 months Yes  Pt has PT eval today   How many times? friend reports "several" falls both inside and out     [place] expects to be discharged to: Private residence   Living Arrangements Alone   Available Help at Discharge Available PRN/intermittently  "friend" is there 9-3 then pt is alone   Additional Comments Pt has medical alerts system. Pt has a nephew in [place] and nephew [place].  Pt is closer emotionally to the one in  [place].  Pt's husband left about a year ago and friend has been assisting 5 days a week     Prior Function   Level of Independence Independent     ADL   Eating/Feeding Independent   Grooming Independent   Upper Body Bathing Modified independent   Lower Body Bathing Modified independent   Upper Body Dressing Minimal assistance  assist with bra but has needed assistance for a long time   Lower Body Dressing Modified independent  pt needs occassional assistance to tie shoes   Toilet Transfer Modified independent  however pt is unsteady    Toileting - Clothing Manipulation Modified independent   Toileting -  Hygiene Modified Independent   Tub/Shower Transfer Supervision/safety  pt takes baths   ADL comments grab bars around the tub, no equipment around toilet pt has cabinet      IADL   Shopping Needs to be accompanied on any shopping trip  pt needed this prior to stroke   Light Housekeeping Does not participate in any housekeeping tasks   Meal Prep --  able to warm something up however pt is very unsteady   Education officer, environmental on family or friends for transportation   Medication Management Has difficulty remembering to take medication  friend sets up box and uses Iphone to remind   Financial Management Requires assistance     Mobility   Mobility Status History of falls     Written Expression   Dominant Hand Right     Vision - History   Baseline Vision Wears glasses all the time   Additional Comments Pt denies any visual changes since stroke     Activity Tolerance   Activity Tolerance Tolerate 30+ min activity without fatigue     Cognition   Overall Cognitive Status History of cognitive impairments - at baseline   Black states that pt has had cognitve deficits for awhile - she feels memory may be slightly worse but that pt is close to baseline. Pt has extremely poor insight into deficts and as well as into what has happened with the stroke.     Attention Sustained   Sustained Attention Impaired   Memory Impaired   Awareness Impaired   Awareness Impairment Intellectual impairment;Emergent impairment;Anticipatory impairment   Problem Solving Impaired     Sensation   Light Touch Impaired by gross assessment  pt inconsistent with reporting but appears impaired   Hot/Cold Appears Intact   Proprioception Appears Intact     Coordination   Gross Motor Movements are Fluid and Coordinated Yes   Fine  Motor Movements are Fluid and Coordinated No  pt has mild tremor for at least one year     Tone   Assessment Location Right Upper Extremity     ROM / Strength   AROM / PROM / Strength AROM;Strength     AROM   Overall AROM  Within functional limits for tasks performed   Overall AROM Comments BUE's     Strength   Overall Strength Within functional limits for tasks performed   Overall Strength Comments Overall 5/5 for BUE's     Hand Function   Right Hand Gross Grasp Functional   Right Hand Grip (lbs) 55   Left Hand Gross Grasp Functional   Left Hand Grip (lbs) 45     RUE Tone   RUE Tone Within Functional Limits                              OT Long Term Goals - 2017-03-23 1250      OT LONG TERM GOAL #1   Title n/a               Plan - 23-Mar-2017 1250    Clinical Impression Statement Pt is an 83 year old female accompanied today by her caregiver who is s/p L thalmic CVA on 01/07/2017. Pt discharged home on 01/18/2017 after inpt rehab stay.  Pt states she has had problems with her balance and walking for amost 10 years and caregiver confirms this.  Pt with cognitive deficits as well as however very poor insight.  Caregiver states pt's memory has been problematic a long time as well and that she started assisting pt 5 days a week when her husband moved out over a year ago.  Caregiver states pt's memory might be slightly worse since stroke but overall appears to be at baseline.  With eval, pt exhibits some  parasthesias of the R hand that she reports as painful however this is the pt's dominant hand and therefore she is using it functional at home.  Discussed that therapy can not truly facilitate sensory return and that pt should continue to use it as much as possible to prevent hand from becoming hypersensitive. Caregiver and pt verbalized understanding.  Pt states the biggest thing it interferes with is typing on computer and that this is because it "feels funny when I hit the keys."  PT to address balance and gait and ST to evaluate cognition.  Given that pt has had caregiver well before stroke do not feel OT is indicated at this time.  Pt and caregiver in agreement.    Occupational Profile and client history currently impacting functional performance R breast cancer, HTN, HLD, TIA   Occupational performance deficits (Please refer to evaluation for details): Leisure   Rehab Potential --  n/a   Plan no further OT indicated at this time   Consulted and Agree with Plan of Care Patient;Family member/caregiver   Family Member Consulted caregiver      Patient will benefit from skilled therapeutic intervention in order to improve the following deficits and impairments:  Impaired sensation  Visit Diagnosis: Frontal lobe and executive function deficit      G-Codes - 23-Mar-2017 1300    Functional Limitation Self care   Self Care Current Status (H8299) At least 1 percent but less than 20 percent impaired, limited or restricted   Self Care Goal Status (B7169) At least 1 percent but less than 20 percent  impaired, limited or restricted   Self Care Discharge Status [phone]) At least 1 percent but less than 20 percent impaired, limited or restricted      Problem List Patient Active Problem List   Diagnosis Date Noted  . Cognitive deficit due to recent stroke 03/08/2017  . Alteration of sensation as late effect of stroke 03/08/2017  . Gait disturbance, post-stroke 03/08/2017  . Acute arterial ischemic  stroke, vertebrobasilar, thalamic, left ([place]) 01/10/2017  . Neurologic gait disorder   . Depression   . Benign essential HTN   . History of breast cancer   . Stroke (cerebrum) ([place]) 01/08/2017  . TIA (transient ischemic attack) 12/16/2016  . Dysarthria 12/16/2016  . Leg weakness 12/16/2016  . EKG abnormalities 12/16/2016  . Chronic pain 12/16/2016  . Elevated blood pressure 12/22/2014  . Internal hemorrhoids with bleeding and prolapse 11/04/2014  . Lactose intolerance 11/04/2014  . Hemorrhoid 09/11/2014  . Rectal bleeding 09/11/2014  . IBS (irritable bowel syndrome) 09/25/2013  . Hyperparathyroidism, primary ([place]) 05/26/2011  . Osteoporosis 05/09/2009  . POLYCYTHEMIA 04/23/2009  . IMPAIRED FASTING GLUCOSE 11/14/2008  . BREAST CANCER, HX OF 12/06/2007  . Anxiety state 10/13/2007  . Essential hypertension 10/13/2007  . INTERSTITIAL CYSTITIS 09/06/2007  . HLD (hyperlipidemia) 03/01/2007  . Backache 03/01/2007    Quay Burow, OTR/L 03/10/2017, 1:00 PM  [facility] [address] [place], [place], [address] Phone: [phone]   Fax:  [phone]  Name: CRISOL MUECKE MRN: [id number] Date of Birth: [birth year]

## 2017-03-15 ENCOUNTER — Ambulatory Visit: Payer: Medicare Other | Admitting: *Deleted

## 2017-03-15 DIAGNOSIS — R2689 Other abnormalities of gait and mobility: Secondary | ICD-10-CM | POA: Diagnosis not present

## 2017-03-15 DIAGNOSIS — R41841 Cognitive communication deficit: Secondary | ICD-10-CM

## 2017-03-15 NOTE — Therapy (Signed)
[facility] [address] [place], [place], [address] Phone: [phone]   Fax:  [phone]  Speech Language Pathology Evaluation  Patient Details  Name: [name] MRN: [id number] Date of Birth: 27-Oct-1929 Referring Provider: Dr. Alysia Penna  Encounter Date: 03/15/2017      End of Session - 03/15/17 1250    Visit Number 1   Number of Visits 17   Date for SLP Re-Evaluation 05/13/17   SLP Start Time 0930   SLP Stop Time  1015   SLP Time Calculation (min) 45 min      Past Medical History:  Diagnosis Date  . Adenomatous colon polyp 04/2005  . Allergic    Anectine  . ANXIETY 10/13/2007  . BACK PAIN 03/01/2007  . BREAST CANCER, HX OF 12/06/2007  . Cancer [facility])    RIGHT mastectomy with node dissection  . Complication of anesthesia    family aggergy to annectine  . Compression fracture of L4 lumbar vertebra (HCC) 02/2013  . CONSTIPATION 01/06/2009  . DEPRESSION 10/13/2007  . Diverticular disease   . DIVERTICULOSIS, COLON 05/11/2005  . External hemorrhoids without mention of complication 12/09/3788  . FATIGUE 08/07/2008  . HEMORRHOIDS, INTERNAL 05/11/2005  . Hypercalcemia 04/23/2009  . HYPERLIPIDEMIA NEC/NOS 03/01/2007  . HYPERTENSION 10/13/2007  . Impaired fasting glucose 11/14/2008  . INTERSTITIAL CYSTITIS 09/06/2007  . Irritable bowel syndrome 01/06/2009  . LIVER MASS 05/15/2009  . OSTEOPOROSIS 05/09/2009  . PARESTHESIA 09/06/2007  . POLYCYTHEMIA 04/23/2009  . STENOSIS, SPINAL, UNSPC REGION 03/01/2007  . Thyroid disease     Past Surgical History:  Procedure Laterality Date  . ABDOMINAL HYSTERECTOMY    . BACK SURGERY    . BREAST SURGERY     mastectomy -right  . CERVICAL SPINE SURGERY    . CHOLECYSTECTOMY    . COLONOSCOPY W/ BIOPSIES    . cortisone injection  10/16/12   left knee  . FIXATION KYPHOPLASTY LUMBAR SPINE    . HEMORRHOID BANDING  2015-16  . PARATHYROID EXPLORATION  06/29/2011   Procedure:  PARATHYROID EXPLORATION;  Surgeon: Earnstine Regal, MD;  Location: [facility];  Service: General;  Laterality: Left;  Left Superior Parathyroidectomy  . PARATHYROIDECTOMY  06/29/11    There were no vitals filed for this visit.      Subjective Assessment - 03/15/17 0944    Subjective My lip and face is numb, and it hurts my right hand to write   Patient is accompained by: --  friend Enid Derry   Currently in Pain? Yes   Pain Score 9    Pain Location Finger (Comment which one)   Pain Orientation Right   Pain Descriptors / Indicators Aching  hot, stinging   Pain Type Acute pain   Pain Onset 1 to 4 weeks ago   Pain Frequency Constant   Aggravating Factors  writing   Pain Relieving Factors sleep   Effect of Pain on Daily Activities difficult to use hand            SLP Evaluation [facility] - 03/15/17 0944      SLP Visit Information   SLP Received On 03/15/17   Referring Provider Dr. Alysia Penna   Onset Date May 2018   Medical Diagnosis TIA, CVA     Subjective   Subjective Pt seen in office for outpatient speech therapy evaluation.    Patient/Family Stated Goal none stated     General Information   HPI 83 year old female referred for outpatient  speech therapy evaluation following TIA (April 2018), and CVA (May 2018).   Behavioral/Cognition Pt pleasant and cooperative   Mobility Status Pt ambulates with a cane     Prior Functional Status   Cognitive/Linguistic Baseline Baseline deficits   Baseline deficit details Pt and caregiver report difficulty with memory. Pt reports her niece, nephew, and caregiver "take care of anything important" (managing finances, refilling meds, etc)   Type of [place] assists 6 hours a day   Education [employer] graduate   Vocation Unemployed     Cognition   Overall Cognitive Status History of cognitive impairments - at baseline (memory)   Attention --  impaired    Memory Impaired   Memory Impairment Storage deficit;Retrieval deficit;Decreased recall of new information;Decreased short term memory   Decreased Short Term Memory Verbal basic;Functional basic   Awareness Impaired   Awareness Impairment Intellectual impairment   Problem Solving Impaired   Problem Solving Impairment Verbal basic;Functional basic   Corporate treasurer;Sequencing;Organizing;Decision Making;Self Monitoring;Self Correcting   Reasoning Impaired   Reasoning Impairment Verbal basic;Functional basic   Sequencing Impaired   Sequencing Impairment Verbal basic;Functional basic   Organizing Impaired   Organizing Impairment Verbal basic;Functional basic   Decision Making Impaired   Decision Making Impairment Verbal basic;Functional basic   Self Monitoring Impaired   Self Monitoring Impairment Verbal basic;Functional basic   Self Correcting Impaired   Self Correcting Impairment Verbal basic;Functional basic     Auditory Comprehension   Overall Auditory Comprehension Appears within functional limits for tasks assessed     Reading Comprehension   Reading Status Not tested     Expression   Primary Mode of Expression Verbal     Verbal Expression   Overall Verbal Expression Appears within functional limits for tasks assessed     Written Expression   Dominant Hand Right   Written Expression Not tested     Oral Motor/Sensory Function   Overall Oral Motor/Sensory Function Appears within functional limits for tasks assessed     Motor Speech   Overall Motor Speech Impaired   Articulation Impaired   Level of Impairment Conversation  minimal dysarthria   Intelligibility Intelligible     Standardized Assessments   Standardized Assessments  Cognitive Linguistic Quick Test (CLQT)     Cognitive Linguistic Quick Test (Ages 44-89)   Attention Severe 34, clock drawing also severely impaired   Memory WNL 142   Executive Function Moderate 10   Language WNL 29    Visuospatial Skills Moderate 25   Severity Rating Total 13   Composite Severity Rating 2.6 (Mild overall)         SLP Education - 03/15/17 1249    Education provided Yes   Education Details SLP discussed initial test results, possible goals, and recommendation for skilled ST.   Person(s) Educated Theatre stage manager);Patient  friend Enid Derry   Methods Explanation;Demonstration   Comprehension Verbalized understanding;Need further instruction          SLP Short Term Goals - 03/15/17 1300      SLP SHORT TERM GOAL #1   Title Pt will verbalize at least 2 cognitive deficits and their functional impact given min cues   Time 4   Period Weeks   Status New   Target Date 04/15/17     SLP SHORT TERM GOAL #2   Title Pt will complete thought organization, problem solving, and reasoning tasks with 80% accuracy given mod  cues   Time 4   Period Weeks   Status New   Target Date 04/15/17     SLP SHORT TERM GOAL #3   Title Pt will complete simple attention to detail tasks with 80% accuracy given min assist   Time 4   Period Weeks   Status New   Target Date 04/15/17     SLP SHORT TERM GOAL #4   Title Pt will identify functional method of improving functional recall, and will bring it to therapy each session   Time 4   Period Weeks   Status New          SLP Long Term Goals - 03-28-2017 1430      SLP LONG TERM GOAL #1   Title Pt will verbalize compensatory strategies for areas of deficit with min cues   Time 8   Period Weeks   Status New   Target Date 05/13/17     SLP LONG TERM GOAL #2   Title Pt will complete Level II/III thought organization, reasoning, problem solving tasks with 80% accuracy given min cues   Time 8   Period Weeks   Status New   Target Date 05/13/17     SLP LONG TERM GOAL #3   Title Pt will complete selective/alternating/divided attention tasks with 80% accuracy given min cues, in distracting environment   Time 8   Period Weeks   Status New   Target Date  05/13/17          Plan - 28-Mar-2017 1254    Clinical Impression Statement The Cognitive Linguistic Quick Test (CLQT) was administered. Difficulty was noted on Attention and Clock Drawing (severe deficits), Executive Function and Visuospatial Skills (Moderate deficits). Memory and Language subtests scored within normal limits, however, pt and caregiver both report difficulty with functional recall. Deficits identified on this assessment raise concern for pt safety and independence. Skilled ST intervention is recommended to address functional recall, various levels of attention, and executive functions including problem solving, reasoning, thought organization, working memory, awareness of deficits, and self monitoring.   Speech Therapy Frequency 2x / week   Duration --  8 weeks   Treatment/Interventions Compensatory strategies;Functional tasks;Patient/family education;Internal/external aids;Compensatory techniques;Multimodal communcation approach   Potential to Achieve Goals Fair   Potential Considerations Family/community support;Previous level of function;Cooperation/participation level;Ability to learn/carryover information   SLP Home Exercise Plan recommend therapy 2x/week for 8 weeks, with focus on cognition   Consulted and Agree with Plan of Care Patient;Family member/caregiver   Family Member Consulted friend Enid Derry      Patient will benefit from skilled therapeutic intervention in order to improve the following deficits and impairments:   Cognitive communication deficit      G-Codes - Mar 28, 2017 1433    Functional Assessment Tool Used asha noms, clinical judgment, CLQT   Functional Limitations Attention   Attention Current Status (V0350) At least 40 percent but less than 60 percent impaired, limited or restricted   Attention Goal Status (K9381) At least 20 percent but less than 40 percent impaired, limited or restricted      Problem List Patient Active Problem List   Diagnosis  Date Noted  . Cognitive deficit due to recent stroke 03/08/2017  . Alteration of sensation as late effect of stroke 03/08/2017  . Gait disturbance, post-stroke 03/08/2017  . Acute arterial ischemic stroke, vertebrobasilar, thalamic, left ([place]) 01/10/2017  . Neurologic gait disorder   . Depression   . Benign essential HTN   . History of breast  cancer   . Stroke (cerebrum) ([place]) 01/08/2017  . TIA (transient ischemic attack) 12/16/2016  . Dysarthria 12/16/2016  . Leg weakness 12/16/2016  . EKG abnormalities 12/16/2016  . Chronic pain 12/16/2016  . Elevated blood pressure 12/22/2014  . Internal hemorrhoids with bleeding and prolapse 11/04/2014  . Lactose intolerance 11/04/2014  . Hemorrhoid 09/11/2014  . Rectal bleeding 09/11/2014  . IBS (irritable bowel syndrome) 09/25/2013  . Hyperparathyroidism, primary ([place]) 05/26/2011  . Osteoporosis 05/09/2009  . POLYCYTHEMIA 04/23/2009  . IMPAIRED FASTING GLUCOSE 11/14/2008  . BREAST CANCER, HX OF 12/06/2007  . Anxiety state 10/13/2007  . Essential hypertension 10/13/2007  . INTERSTITIAL CYSTITIS 09/06/2007  . HLD (hyperlipidemia) 03/01/2007  . Backache 03/01/2007   Carmyn Hamm B. [place], MSP, CCC-SLP  Shonna Chock 03/15/2017, 2:38 PM  [facility] [address] [place], [place], [address] Phone: [phone]   Fax:  [phone]  Name: [name] MRN: [id number] Date of Birth: 1930-03-01

## 2017-03-21 ENCOUNTER — Encounter: Payer: Self-pay | Admitting: Physical Therapy

## 2017-03-21 ENCOUNTER — Ambulatory Visit: Payer: Medicare Other | Admitting: Physical Therapy

## 2017-03-21 ENCOUNTER — Ambulatory Visit: Payer: Medicare Other | Admitting: Nurse Practitioner

## 2017-03-21 ENCOUNTER — Ambulatory Visit: Payer: Medicare Other | Admitting: Diagnostic Neuroimaging

## 2017-03-21 ENCOUNTER — Ambulatory Visit: Payer: Medicare Other | Admitting: Speech Pathology

## 2017-03-21 DIAGNOSIS — R2689 Other abnormalities of gait and mobility: Secondary | ICD-10-CM | POA: Diagnosis not present

## 2017-03-21 DIAGNOSIS — R41844 Frontal lobe and executive function deficit: Secondary | ICD-10-CM

## 2017-03-21 DIAGNOSIS — R2681 Unsteadiness on feet: Secondary | ICD-10-CM

## 2017-03-21 DIAGNOSIS — I69351 Hemiplegia and hemiparesis following cerebral infarction affecting right dominant side: Secondary | ICD-10-CM

## 2017-03-21 DIAGNOSIS — R42 Dizziness and giddiness: Secondary | ICD-10-CM

## 2017-03-21 DIAGNOSIS — R41841 Cognitive communication deficit: Secondary | ICD-10-CM

## 2017-03-21 NOTE — Therapy (Signed)
[facility] [address] [place], [place], [address] Phone: [phone]   Fax:  [phone]  Physical Therapy Treatment  Patient Details  Name: [name] MRN: [id number] Date of Birth: 18-Jan-1930 Referring Provider: Dr. Letta Pate  Encounter Date: 03/21/2017      PT End of Session - 03/21/17 1201    Visit Number 2   Number of Visits 9   Date for PT Re-Evaluation 04/09/17   Authorization Type Medicare and BCBS Federal: G-CODE AND PN EVERY 10TH VISIT.    PT Start Time 1104   PT Stop Time 1151   PT Time Calculation (min) 47 min   Equipment Utilized During Treatment --  min guard to min A prn   Activity Tolerance Patient tolerated treatment well   Behavior During Therapy Impulsive  decr. safey awareness      Past Medical History:  Diagnosis Date  . Adenomatous colon polyp 04/2005  . Allergic    Anectine  . ANXIETY 10/13/2007  . BACK PAIN 03/01/2007  . BREAST CANCER, HX OF 12/06/2007  . Cancer [facility])    RIGHT mastectomy with node dissection  . Complication of anesthesia    family aggergy to annectine  . Compression fracture of L4 lumbar vertebra (HCC) 02/2013  . CONSTIPATION 01/06/2009  . DEPRESSION 10/13/2007  . Diverticular disease   . DIVERTICULOSIS, COLON 05/11/2005  . External hemorrhoids without mention of complication 04/19/33  . FATIGUE 08/07/2008  . HEMORRHOIDS, INTERNAL 05/11/2005  . Hypercalcemia 04/23/2009  . HYPERLIPIDEMIA NEC/NOS 03/01/2007  . HYPERTENSION 10/13/2007  . Impaired fasting glucose 11/14/2008  . INTERSTITIAL CYSTITIS 09/06/2007  . Irritable bowel syndrome 01/06/2009  . LIVER MASS 05/15/2009  . OSTEOPOROSIS 05/09/2009  . PARESTHESIA 09/06/2007  . POLYCYTHEMIA 04/23/2009  . STENOSIS, SPINAL, UNSPC REGION 03/01/2007  . Thyroid disease     Past Surgical History:  Procedure Laterality Date  . ABDOMINAL HYSTERECTOMY    . BACK SURGERY    . BREAST SURGERY     mastectomy -right  .  CERVICAL SPINE SURGERY    . CHOLECYSTECTOMY    . COLONOSCOPY W/ BIOPSIES    . cortisone injection  10/16/12   left knee  . FIXATION KYPHOPLASTY LUMBAR SPINE    . HEMORRHOID BANDING  2015-16  . PARATHYROID EXPLORATION  06/29/2011   Procedure: PARATHYROID EXPLORATION;  Surgeon: Earnstine Regal, MD;  Location: [facility];  Service: General;  Laterality: Left;  Left Superior Parathyroidectomy  . PARATHYROIDECTOMY  06/29/11    There were no vitals filed for this visit.      Subjective Assessment - 03/21/17 1112    Subjective Caregiver present; pt just finished with SLP.  No falls to report; has been walking around the house without cane.  Pt reports ambulation issues and dizziness prior to CVA-feels like RUE was the most affected s/p CVA; reports having difficulty feeling items and holding items in R hand.  Pt denies having any dizziness   Pertinent History HTN, interstitial cystitis, HLD, hyperparathyroidism, cognitive deficits from CVA, hx of R breast CA s/p mastectomy, R hand parasthesia after CVA, osteoporosis, depression, anxiety   Patient Stated Goals Get my balance back.    Currently in Pain? Yes   Pain Score 8    Pain Location Hand   Pain Orientation Right   Pain Descriptors / Indicators Tingling   Pain Type Neuropathic pain  [facility] Adult PT Treatment/Exercise - 03/21/17 1126      Therapeutic Activites    Therapeutic Activities Other Therapeutic Activities   Other Therapeutic Activities identification of items in R hand with eyes closed: key, button, dice, screw bolt, penny             Balance Exercises - 03/21/17 1127      OTAGO PROGRAM   Neck Movements Standing;5 reps   Trunk Movements Standing;5 reps   Ankle Movements Sitting;10 reps   Knee Extensor 10 reps   Knee Flexor 10 reps   Hip ABductor 10 reps           PT Education - 03/21/17 1200    Education provided Yes   Education Details initiated OTAGO HEP; advised to perform  with bilat UE support and only when caregiver present   Person(s) Educated Patient;Caregiver(s)   Methods Explanation;Demonstration;Handout   Comprehension Verbalized understanding;Returned demonstration          PT Short Term Goals - 03/10/17 1200      PT SHORT TERM GOAL #1   Title same as LTGs           PT Long Term Goals - 03/10/17 1200      PT LONG TERM GOAL #1   Title Pt will perform HEP at MOD I level with caregiver's assist to improve balance, strength, dizziness, and safety. TARGET DATE FOR ALL LTGS: 04/07/17   Status New     PT LONG TERM GOAL #2   Title Pt will improve DGI score to >/=15/24 to decr. falls risk.    Status New     PT LONG TERM GOAL #3   Title Pt will amb. 500' over even/uneven terrain with LRAD at MOD I level to improve functional mobility.    Status New     PT LONG TERM GOAL #4   Title Perform vestibular assessment and write goals prn.   Status New               Plan - 03/21/17 1201    Clinical Impression Statement Pt continues to present with memory and safety awareness issues and is poor historian-pt reporting that gait, balance and dizziness issues were present prior to CVA but also denies any new dizziness. Pt also reporting difficulty holding and identifying items in her RUE. Treatment session focused on assessment of proprioception and item descrimination in RUE with eyes closed with pt able to ID 4/5 items but with extra time and min verbal cues.  Initiated OTAGO HEP and guided pt through first 4-5 exercises in sitting and standing with significant focus on safety when performing at home. Caregiver was present to observe and reinforce technique and safety. Will continue to progress and plan to further assess vestibular system at next session.   Rehab Potential Fair  based on cognition   Clinical Impairments Affecting Rehab Potential see above   PT Frequency 2x / week   PT Duration 4 weeks   PT Treatment/Interventions ADLs/Self Care  Home Management;Biofeedback;Canalith Repostioning;Electrical Stimulation;Cognitive remediation;Neuromuscular re-education;Balance training;Therapeutic exercise;Therapeutic activities;Functional mobility training;Stair training;Gait training;DME Instruction;Orthotic Fit/Training;Patient/family education;Vestibular   PT Next Visit Plan perform Vestibular assessment; continue to add exercises to HEP; R NMR, weight shifting, gait, RLE clearance   Consulted and Agree with Plan of Care Patient;Family member/caregiver   Family Member Consulted caregiver-Shirley      Patient will benefit from skilled therapeutic intervention in order to improve the following deficits and impairments:  Abnormal gait, Decreased endurance, Decreased knowledge of  use of DME, Decreased balance, Decreased mobility, Dizziness, Decreased coordination, Decreased safety awareness, Impaired flexibility, Postural dysfunction, Decreased strength, Pain, Impaired sensation (PT will monitor pain but will not directly address)  Visit Diagnosis: Other abnormalities of gait and mobility  Hemiplegia and hemiparesis following cerebral infarction affecting right dominant side (HCC)  Dizziness and giddiness  Unsteadiness on feet     Problem List Patient Active Problem List   Diagnosis Date Noted  . Cognitive deficit due to recent stroke 03/08/2017  . Alteration of sensation as late effect of stroke 03/08/2017  . Gait disturbance, post-stroke 03/08/2017  . Acute arterial ischemic stroke, vertebrobasilar, thalamic, left ([place]) 01/10/2017  . Neurologic gait disorder   . Depression   . Benign essential HTN   . History of breast cancer   . Stroke (cerebrum) ([place]) 01/08/2017  . TIA (transient ischemic attack) 12/16/2016  . Dysarthria 12/16/2016  . Leg weakness 12/16/2016  . EKG abnormalities 12/16/2016  . Chronic pain 12/16/2016  . Elevated blood pressure 12/22/2014  . Internal hemorrhoids with bleeding and prolapse 11/04/2014  .  Lactose intolerance 11/04/2014  . Hemorrhoid 09/11/2014  . Rectal bleeding 09/11/2014  . IBS (irritable bowel syndrome) 09/25/2013  . Hyperparathyroidism, primary ([facility]) 05/26/2011  . Osteoporosis 05/09/2009  . POLYCYTHEMIA 04/23/2009  . IMPAIRED FASTING GLUCOSE 11/14/2008  . BREAST CANCER, HX OF 12/06/2007  . Anxiety state 10/13/2007  . Essential hypertension 10/13/2007  . INTERSTITIAL CYSTITIS 09/06/2007  . HLD (hyperlipidemia) 03/01/2007  . Backache 03/01/2007    Raylene Everts, PT, DPT 03/21/17    12:31 PM    [facility] [address] [place], [place], [address] Phone: [phone]   Fax:  [phone]  Name: [name] MRN: [id number] Date of Birth: 1929/11/18

## 2017-03-21 NOTE — Therapy (Signed)
[facility] [address] [place], [place], [address] Phone: [phone]   Fax:  [phone]  Speech Language Pathology Treatment  Patient Details  Name: [name] MRN: [id number] Date of Birth: [birth year] Referring Provider: Dr. Alysia Penna  Encounter Date: 03/21/2017      End of Session - 03/21/17 1240    Visit Number 2   Number of Visits 17   Date for SLP Re-Evaluation 05/13/17   SLP Start Time 9628   SLP Stop Time  1232   SLP Time Calculation (min) 43 min   Activity Tolerance Patient tolerated treatment well      Past Medical History:  Diagnosis Date  . Adenomatous colon polyp 04/2005  . Allergic    Anectine  . ANXIETY 10/13/2007  . BACK PAIN 03/01/2007  . BREAST CANCER, HX OF 12/06/2007  . Cancer [facility])    RIGHT mastectomy with node dissection  . Complication of anesthesia    family aggergy to annectine  . Compression fracture of L4 lumbar vertebra (HCC) 02/2013  . CONSTIPATION 01/06/2009  . DEPRESSION 10/13/2007  . Diverticular disease   . DIVERTICULOSIS, COLON 05/11/2005  . External hemorrhoids without mention of complication 3/66/2947  . FATIGUE 08/07/2008  . HEMORRHOIDS, INTERNAL 05/11/2005  . Hypercalcemia 04/23/2009  . HYPERLIPIDEMIA NEC/NOS 03/01/2007  . HYPERTENSION 10/13/2007  . Impaired fasting glucose 11/14/2008  . INTERSTITIAL CYSTITIS 09/06/2007  . Irritable bowel syndrome 01/06/2009  . LIVER MASS 05/15/2009  . OSTEOPOROSIS 05/09/2009  . PARESTHESIA 09/06/2007  . POLYCYTHEMIA 04/23/2009  . STENOSIS, SPINAL, UNSPC REGION 03/01/2007  . Thyroid disease     Past Surgical History:  Procedure Laterality Date  . ABDOMINAL HYSTERECTOMY    . BACK SURGERY    . BREAST SURGERY     mastectomy -right  . CERVICAL SPINE SURGERY    . CHOLECYSTECTOMY    . COLONOSCOPY W/ BIOPSIES    . cortisone injection  10/16/12   left knee  . FIXATION KYPHOPLASTY LUMBAR SPINE    . HEMORRHOID BANDING  2015-16  .  PARATHYROID EXPLORATION  06/29/2011   Procedure: PARATHYROID EXPLORATION;  Surgeon: Earnstine Regal, MD;  Location: [facility];  Service: General;  Laterality: Left;  Left Superior Parathyroidectomy  . PARATHYROIDECTOMY  06/29/11    There were no vitals filed for this visit.      Subjective Assessment - 03/21/17 1153    Subjective "If i don't wanna remember it why should I?" pt denies memory issues   Patient is accompained by: --  Lawrence Marseilles   Currently in Pain? Yes   Pain Score 8    Pain Location Hand   Pain Orientation Right   Pain Descriptors / Indicators Tingling   Pain Type Neuropathic pain   Pain Onset 1 to 4 weeks ago               ADULT SLP TREATMENT - 03/21/17 1154      General Information   Behavior/Cognition Alert;Cooperative   Patient Positioning Upright in chair   Oral care provided N/A     Treatment Provided   Treatment provided Cognitive-Linquistic     Cognitive-Linquistic Treatment   Treatment focused on Cognition   Skilled Treatment SLP reviewed previous session's evaluation findings with pt, caregiver and discussed goals of care. Pt adamantly denies problems with memory, however caregiver reports pt forgot to take her blood thinner 4/7 days last week. Also reports pt leaving toaster on overnight. SLP attempted to facilitate awareness of deficit  areas, however pt states, "If something is important, I can remember it." When asked if she felt her blood thinner was important, pt responded affirmatively but demonstrated no insight into why she couldn't remember to take it. SLP worked with pt, caregiver to revise strategy for evening medication reminder as pt has been turning off her phone at night. Caregiver plans to set a bedside alarm clock. SLP provided education re: use of day planner for functional recall. Pt states she has such a planner at home and will bring to next session.     Assessment / Recommendations / Plan   Plan Continue with current plan of care      Progression Toward Goals   Progression toward goals Progressing toward goals          SLP Education - 03/21/17 1916    Education provided Yes   Education Details deficit areas, use of day planner, supervision recommended for all cooking with exception of microwave   Person(s) Educated Patient;Caregiver(s)   Methods Explanation;Verbal cues   Comprehension Need further instruction          SLP Short Term Goals - 03/21/17 1921      SLP SHORT TERM GOAL #1   Title Pt will verbalize at least 2 cognitive deficits and their functional impact given min cues   Time 3   Period Weeks   Status On-going   Target Date 04/15/17     SLP SHORT TERM GOAL #2   Title Pt will complete thought organization, problem solving, and reasoning tasks with 80% accuracy given mod cues   Time 3   Period Weeks   Status On-going   Target Date 04/15/17     SLP SHORT TERM GOAL #3   Title Pt will complete simple attention to detail tasks with 80% accuracy given min assist   Time 3   Status On-going   Target Date 04/15/17     SLP SHORT TERM GOAL #4   Title Pt will identify functional method of improving functional recall, and will bring it to therapy each session   Time 3   Period Weeks   Status On-going   Target Date 04/15/17          SLP Long Term Goals - 03/21/17 1922      SLP LONG TERM GOAL #1   Title Pt will verbalize compensatory strategies for areas of deficit with min cues   Time 7   Period Weeks   Status On-going   Target Date 05/13/17     SLP LONG TERM GOAL #2   Title Pt will complete Level II/III thought organization, reasoning, problem solving tasks with 80% accuracy given min cues   Time 7   Period Weeks   Status On-going   Target Date 05/13/17     SLP LONG TERM GOAL #3   Title Pt will complete selective/alternating/divided attention tasks with 80% accuracy given min cues, in distracting environment   Time 7   Period Weeks   Status On-going   Target Date 05/13/17           Plan - 03/21/17 1918    Clinical Impression Statement Patient presents today with impaired functional recall, sustained attention, and executive function. Pt's insight into deficits is significantly impaired. Recommend skilled ST to address her cognitive deficits in order to reduce caregiver burden, improve safety and quality of life.   Speech Therapy Frequency 2x / week   Treatment/Interventions Compensatory strategies;Functional tasks;Patient/family education;Internal/external aids;Compensatory techniques;Multimodal communcation approach  Potential to Achieve Goals Fair   Potential Considerations Family/community support;Previous level of function;Cooperation/participation level;Ability to learn/carryover information   SLP Home Exercise Plan day planner use   Consulted and Agree with Plan of Care Patient;Family member/caregiver   Family Member Consulted friend/caregiver Enid Derry      Patient will benefit from skilled therapeutic intervention in order to improve the following deficits and impairments:   Cognitive communication deficit  Frontal lobe and executive function deficit    Problem List Patient Active Problem List   Diagnosis Date Noted  . Cognitive deficit due to recent stroke 03/08/2017  . Alteration of sensation as late effect of stroke 03/08/2017  . Gait disturbance, post-stroke 03/08/2017  . Acute arterial ischemic stroke, vertebrobasilar, thalamic, left ([place]) 01/10/2017  . Neurologic gait disorder   . Depression   . Benign essential HTN   . History of breast cancer   . Stroke (cerebrum) ([place]) 01/08/2017  . TIA (transient ischemic attack) 12/16/2016  . Dysarthria 12/16/2016  . Leg weakness 12/16/2016  . EKG abnormalities 12/16/2016  . Chronic pain 12/16/2016  . Elevated blood pressure 12/22/2014  . Internal hemorrhoids with bleeding and prolapse 11/04/2014  . Lactose intolerance 11/04/2014  . Hemorrhoid 09/11/2014  . Rectal bleeding 09/11/2014  .  IBS (irritable bowel syndrome) 09/25/2013  . Hyperparathyroidism, primary ([place]) 05/26/2011  . Osteoporosis 05/09/2009  . POLYCYTHEMIA 04/23/2009  . IMPAIRED FASTING GLUCOSE 11/14/2008  . BREAST CANCER, HX OF 12/06/2007  . Anxiety state 10/13/2007  . Essential hypertension 10/13/2007  . INTERSTITIAL CYSTITIS 09/06/2007  . HLD (hyperlipidemia) 03/01/2007  . Backache 03/01/2007   Deneise Lever, [place], CCC-SLP Speech-Language Pathologist  Aliene Altes 03/21/2017, 7:23 PM  [facility] [address] [place], [place], [address] Phone: [phone]   Fax:  [phone]   Name: KELLIANN PENDERGRAPH MRN: [id number] Date of Birth: 1930-02-11

## 2017-03-24 ENCOUNTER — Encounter: Payer: Self-pay | Admitting: Physical Therapy

## 2017-03-24 ENCOUNTER — Ambulatory Visit: Payer: Medicare Other | Attending: Physical Medicine & Rehabilitation | Admitting: Speech Pathology

## 2017-03-24 ENCOUNTER — Ambulatory Visit: Payer: Medicare Other | Admitting: Physical Therapy

## 2017-03-24 DIAGNOSIS — R42 Dizziness and giddiness: Secondary | ICD-10-CM | POA: Diagnosis present

## 2017-03-24 DIAGNOSIS — I69351 Hemiplegia and hemiparesis following cerebral infarction affecting right dominant side: Secondary | ICD-10-CM | POA: Diagnosis present

## 2017-03-24 DIAGNOSIS — R2689 Other abnormalities of gait and mobility: Secondary | ICD-10-CM

## 2017-03-24 DIAGNOSIS — R41841 Cognitive communication deficit: Secondary | ICD-10-CM

## 2017-03-24 DIAGNOSIS — R2681 Unsteadiness on feet: Secondary | ICD-10-CM | POA: Diagnosis present

## 2017-03-24 NOTE — Therapy (Signed)
[name] [address] [place], [place], [address] Phone: [phone]   Fax:  [phone]  Physical Therapy Treatment  Patient Details  Name: [name] MRN: [id number] Date of Birth: [birth year] Referring Provider: Dr. Letta Pate  Encounter Date: 03/24/2017      PT End of Session - 03/24/17 1439    Visit Number 3   Number of Visits 9   Date for PT Re-Evaluation 04/09/17   Authorization Type Medicare and BCBS Federal: G-CODE AND PN EVERY 10TH VISIT.    PT Start Time 1406   PT Stop Time 1448   PT Time Calculation (min) 42 min   Equipment Utilized During Treatment --  min guard to min A prn   Activity Tolerance Patient tolerated treatment well   Behavior During Therapy --      Past Medical History:  Diagnosis Date  . Adenomatous colon polyp 04/2005  . Allergic    Anectine  . ANXIETY 10/13/2007  . BACK PAIN 03/01/2007  . BREAST CANCER, HX OF 12/06/2007  . Cancer [facility])    RIGHT mastectomy with node dissection  . Complication of anesthesia    family aggergy to annectine  . Compression fracture of L4 lumbar vertebra (HCC) 02/2013  . CONSTIPATION 01/06/2009  . DEPRESSION 10/13/2007  . Diverticular disease   . DIVERTICULOSIS, COLON 05/11/2005  . External hemorrhoids without mention of complication 11/19/5186  . FATIGUE 08/07/2008  . HEMORRHOIDS, INTERNAL 05/11/2005  . Hypercalcemia 04/23/2009  . HYPERLIPIDEMIA NEC/NOS 03/01/2007  . HYPERTENSION 10/13/2007  . Impaired fasting glucose 11/14/2008  . INTERSTITIAL CYSTITIS 09/06/2007  . Irritable bowel syndrome 01/06/2009  . LIVER MASS 05/15/2009  . OSTEOPOROSIS 05/09/2009  . PARESTHESIA 09/06/2007  . POLYCYTHEMIA 04/23/2009  . STENOSIS, SPINAL, UNSPC REGION 03/01/2007  . Thyroid disease     Past Surgical History:  Procedure Laterality Date  . ABDOMINAL HYSTERECTOMY    . BACK SURGERY    . BREAST SURGERY     mastectomy -right  . CERVICAL SPINE SURGERY    .  CHOLECYSTECTOMY    . COLONOSCOPY W/ BIOPSIES    . cortisone injection  10/16/12   left knee  . FIXATION KYPHOPLASTY LUMBAR SPINE    . HEMORRHOID BANDING  2015-16  . PARATHYROID EXPLORATION  06/29/2011   Procedure: PARATHYROID EXPLORATION;  Surgeon: Earnstine Regal, MD;  Location: [facility];  Service: General;  Laterality: Left;  Left Superior Parathyroidectomy  . PARATHYROIDECTOMY  06/29/11    There were no vitals filed for this visit.      Subjective Assessment - 03/24/17 1423    Subjective Patient reported that she had not done her HEP, even though she's sure she'd be able to if she wanted to; also reported that she had done them a few times, but not all week, and only when she wants to do them. Claims to understand that exercises are important and help her get better.    Patient is accompained by: --  Caregiver   Pertinent History HTN, interstitial cystitis, HLD, hyperparathyroidism, cognitive deficits from CVA, hx of R breast CA s/p mastectomy, R hand parasthesia after CVA, osteoporosis, depression, anxiety   Patient Stated Goals Be able to do the things I want to do.   Currently in Pain? Yes   Pain Score 8    Pain Location Hand   Pain Orientation Right   Pain Descriptors / Indicators Tingling   Pain Type Neuropathic pain   Pain Onset 1 to 4 weeks ago  Pain Frequency Constant   Aggravating Factors  touching things, using hand   Pain Relieving Factors arnicare   Effect of Pain on Daily Activities difficult to use hand                         [facility] Adult PT Treatment/Exercise - 03/24/17 0001      Transfers   Transfers --     Therapeutic Activites    Therapeutic Activities Other Therapeutic Activities   Other Therapeutic Activities Standing from partial kneeling at bench to simulate gardening activities     Exercises   Exercises Other Exercises  OTAGO exercises             Balance Exercises - 03/24/17 1502      Balance Exercises: Standing   Marching  Limitations High knee marches, holding counter for balance; 4 laps along counter     OTAGO PROGRAM   Ankle Plantorflexors 20 reps, support  10 reps, holding counter, 10 reps reaching alternate arm ove   Ankle Dorsiflexors 20 reps, support   Tandem Walk Support  Holding onto counter   Heel Walking Support  holding onto counter   Toe Walk Support  Holding onto counter   Overall OTAGO Comments --  Required vis, verbal, tactile and demo cues from PT           PT Education - 03/24/17 1437    Education provided Yes   Education Details Discussed importance of HEP. Patient claimed to understand the importance of doing them.   Person(s) Educated Patient;Caregiver(s)   Methods Explanation;Demonstration;Handout   Comprehension Verbalized understanding          PT Short Term Goals - 03/10/17 1200      PT SHORT TERM GOAL #1   Title same as LTGs           PT Long Term Goals - 03/10/17 1200      PT LONG TERM GOAL #1   Title Pt will perform HEP at MOD I level with caregiver's assist to improve balance, strength, dizziness, and safety. TARGET DATE FOR ALL LTGS: 04/07/17   Status New     PT LONG TERM GOAL #2   Title Pt will improve DGI score to >/=15/24 to decr. falls risk.    Status New     PT LONG TERM GOAL #3   Title Pt will amb. 500' over even/uneven terrain with LRAD at MOD I level to improve functional mobility.    Status New     PT LONG TERM GOAL #4   Title Perform vestibular assessment and write goals prn.   Status New               Plan - 03/24/17 1509    Clinical Impression Statement Pt continues to present with memory issues and is poor historian, reporting that she did not do HEP then that she did. Pt reported not seeing the point of HEP, then reported understanding that they can help her return to PLOF. Treatment session focused on exercises to simulate gardening and household activities, including higher level OTAGO exercises and partial kneeling from  bench. Pt required visual, verbal, tactile and demonstrative cues from PT in order to maintain proper form.    History and Personal Factors relevant to plan of care: Poor historian. Lives alone with limited family support. Caregiver present weekdays.   Rehab Potential Fair  based on cognition   Clinical Impairments Affecting Rehab Potential see above  PT Frequency 2x / week   PT Duration 4 weeks   PT Treatment/Interventions ADLs/Self Care Home Management;Biofeedback;Canalith Repostioning;Electrical Stimulation;Cognitive remediation;Neuromuscular re-education;Balance training;Therapeutic exercise;Therapeutic activities;Functional mobility training;Stair training;Gait training;DME Instruction;Orthotic Fit/Training;Patient/family education;Vestibular   PT Next Visit Plan perform Vestibular assessment; activities to simulate gardening/kneeling/floor transfer; continue to add exercises to HEP; R NMR, weight shifting, gait, RLE clearance   Consulted and Agree with Plan of Care Patient;Family member/caregiver   Family Member Consulted caregiver-Shirley      Patient will benefit from skilled therapeutic intervention in order to improve the following deficits and impairments:  Abnormal gait, Decreased endurance, Decreased knowledge of use of DME, Decreased balance, Decreased mobility, Dizziness, Decreased coordination, Decreased safety awareness, Impaired flexibility, Postural dysfunction, Decreased strength, Pain, Impaired sensation (PT will monitor pain but will not directly address)  Visit Diagnosis: Other abnormalities of gait and mobility  Hemiplegia and hemiparesis following cerebral infarction affecting right dominant side (HCC)  Dizziness and giddiness  Unsteadiness on feet     Problem List Patient Active Problem List   Diagnosis Date Noted  . Cognitive deficit due to recent stroke 03/08/2017  . Alteration of sensation as late effect of stroke 03/08/2017  . Gait disturbance,  post-stroke 03/08/2017  . Acute arterial ischemic stroke, vertebrobasilar, thalamic, left ([place]) 01/10/2017  . Neurologic gait disorder   . Depression   . Benign essential HTN   . History of breast cancer   . Stroke (cerebrum) ([place]) 01/08/2017  . TIA (transient ischemic attack) 12/16/2016  . Dysarthria 12/16/2016  . Leg weakness 12/16/2016  . EKG abnormalities 12/16/2016  . Chronic pain 12/16/2016  . Elevated blood pressure 12/22/2014  . Internal hemorrhoids with bleeding and prolapse 11/04/2014  . Lactose intolerance 11/04/2014  . Hemorrhoid 09/11/2014  . Rectal bleeding 09/11/2014  . IBS (irritable bowel syndrome) 09/25/2013  . Hyperparathyroidism, primary ([place]) 05/26/2011  . Osteoporosis 05/09/2009  . POLYCYTHEMIA 04/23/2009  . IMPAIRED FASTING GLUCOSE 11/14/2008  . BREAST CANCER, HX OF 12/06/2007  . Anxiety state 10/13/2007  . Essential hypertension 10/13/2007  . INTERSTITIAL CYSTITIS 09/06/2007  . HLD (hyperlipidemia) 03/01/2007  . Backache 03/01/2007    Gershon Crane, SPT 03/24/2017, 5:11 PM  [facility] [address] [place], [place], [address] Phone: [phone]   Fax:  [phone]  Name: TIASIA WEBERG MRN: [id number] Date of Birth: [birth year]

## 2017-03-24 NOTE — Therapy (Signed)
[facility] [address] [place], [place], [address] Phone: [phone]   Fax:  [phone]  Speech Language Pathology Treatment  Patient Details  Name: Selena LABREE MRN: [id number] Date of Birth: 11/21/1929 Referring Provider: Dr. Alysia Penna  Encounter Date: 03/24/2017      End of Session - 03/24/17 1753    Visit Number 3   Number of Visits 17   Date for SLP Re-Evaluation 05/13/17   SLP Start Time 2778   SLP Stop Time  1400   SLP Time Calculation (min) 42 min   Activity Tolerance --  pt limited by frustration      Past Medical History:  Diagnosis Date  . Adenomatous colon polyp 04/2005  . Allergic    Anectine  . ANXIETY 10/13/2007  . BACK PAIN 03/01/2007  . BREAST CANCER, HX OF 12/06/2007  . Cancer [facility])    RIGHT mastectomy with node dissection  . Complication of anesthesia    family aggergy to annectine  . Compression fracture of L4 lumbar vertebra (HCC) 02/2013  . CONSTIPATION 01/06/2009  . DEPRESSION 10/13/2007  . Diverticular disease   . DIVERTICULOSIS, COLON 05/11/2005  . External hemorrhoids without mention of complication 2/42/3536  . FATIGUE 08/07/2008  . HEMORRHOIDS, INTERNAL 05/11/2005  . Hypercalcemia 04/23/2009  . HYPERLIPIDEMIA NEC/NOS 03/01/2007  . HYPERTENSION 10/13/2007  . Impaired fasting glucose 11/14/2008  . INTERSTITIAL CYSTITIS 09/06/2007  . Irritable bowel syndrome 01/06/2009  . LIVER MASS 05/15/2009  . OSTEOPOROSIS 05/09/2009  . PARESTHESIA 09/06/2007  . POLYCYTHEMIA 04/23/2009  . STENOSIS, SPINAL, UNSPC REGION 03/01/2007  . Thyroid disease     Past Surgical History:  Procedure Laterality Date  . ABDOMINAL HYSTERECTOMY    . BACK SURGERY    . BREAST SURGERY     mastectomy -right  . CERVICAL SPINE SURGERY    . CHOLECYSTECTOMY    . COLONOSCOPY W/ BIOPSIES    . cortisone injection  10/16/12   left knee  . FIXATION KYPHOPLASTY LUMBAR SPINE    . HEMORRHOID BANDING  2015-16  .  PARATHYROID EXPLORATION  06/29/2011   Procedure: PARATHYROID EXPLORATION;  Surgeon: Earnstine Regal, MD;  Location: [facility];  Service: General;  Laterality: Left;  Left Superior Parathyroidectomy  . PARATHYROIDECTOMY  06/29/11    There were no vitals filed for this visit.      Subjective Assessment - 03/24/17 1322    Subjective "I'd have remembered if it was important." unable to recall which way to ST treatment room   Currently in Pain? Yes   Pain Score 8    Pain Location Hand   Pain Orientation Right   Pain Descriptors / Indicators Tingling   Pain Type Neuropathic pain   Pain Onset 1 to 4 weeks ago   Aggravating Factors  touching things, using hand   Pain Relieving Factors arnicare   Effect of Pain on Daily Activities difficult to use hand               ADULT SLP TREATMENT - 03/24/17 1324      General Information   Behavior/Cognition Alert;Cooperative   Patient Positioning Upright in chair   Oral care provided N/A     Treatment Provided   Treatment provided Cognitive-Linquistic     Cognitive-Linquistic Treatment   Treatment focused on Cognition   Skilled Treatment SLP followed up with pt re: remembering to take her blood thinner. Pt responded with 5 minutes tangential speech and was unable to respond appropriately to  question despite attempted redirection. Her caregiver reports pt has remembered to take her blood thinner every evening this week using bedside alarm set by caregiver. Pt brought her planner to today's session; she required consistent max A to identify month and date, however she was unable to identify the correct date without continued assistance in spaced retrieval attempts. Simple functional organization/calendar math with 60% accuracy; pt with reduced sustained attention and task persistence.     Assessment / Recommendations / Plan   Plan Continue with current plan of care     Progression Toward Goals   Progression toward goals Progressing toward goals           SLP Education - 03/24/17 1752    Education provided Yes   Education Details Use of strategies to improve safety and independence, deficit areas   Person(s) Educated Patient;Caregiver(s)   Methods Explanation;Verbal cues   Comprehension Need further instruction          SLP Short Term Goals - 03/24/17 1753      SLP SHORT TERM GOAL #1   Title Pt will verbalize at least 2 cognitive deficits and their functional impact given min cues   Time 3   Period Weeks   Status On-going   Target Date 04/15/17     SLP SHORT TERM GOAL #2   Title Pt will complete thought organization, problem solving, and reasoning tasks with 80% accuracy given mod cues   Time 3   Period Weeks   Status On-going   Target Date 04/15/17     SLP SHORT TERM GOAL #3   Title Pt will complete simple attention to detail tasks with 80% accuracy given min assist   Time 3   Period Weeks   Status On-going   Target Date 04/15/17     SLP SHORT TERM GOAL #4   Title Pt will identify functional method of improving functional recall, and will bring it to therapy each session   Time 3   Period Weeks   Status On-going   Target Date 04/15/17          SLP Long Term Goals - 03/24/17 1754      SLP LONG TERM GOAL #1   Title Pt will verbalize compensatory strategies for areas of deficit with min cues   Time 7   Period Weeks   Status On-going   Target Date 05/13/17     SLP LONG TERM GOAL #2   Title Pt will complete Level II/III thought organization, reasoning, problem solving tasks with 80% accuracy given min cues   Time 7   Period Weeks   Status On-going   Target Date 05/13/17     SLP LONG TERM GOAL #3   Title Pt will complete selective/alternating/divided attention tasks with 80% accuracy given min cues, in distracting environment   Time 7   Period Weeks   Status On-going   Target Date 05/13/17          Plan - 03/24/17 1753    Clinical Impression Statement Patient presents today with  impaired functional recall, sustained attention, and executive function. Pt's insight into deficits is significantly impaired. Recommend skilled ST to address her cognitive deficits in order to reduce caregiver burden, improve safety and quality of life.   Speech Therapy Frequency 2x / week   Treatment/Interventions Compensatory strategies;Functional tasks;Patient/family education;Internal/external aids;Compensatory techniques;Multimodal communcation approach   Potential to Achieve Goals Fair   Potential Considerations Family/community support;Previous level of function;Cooperation/participation level;Ability to learn/carryover information  SLP Home Exercise Plan day planner use   Consulted and Agree with Plan of Care Patient;Family member/caregiver   Family Member Consulted friend/caregiver Enid Derry      Patient will benefit from skilled therapeutic intervention in order to improve the following deficits and impairments:   Cognitive communication deficit    Problem List Patient Active Problem List   Diagnosis Date Noted  . Cognitive deficit due to recent stroke 03/08/2017  . Alteration of sensation as late effect of stroke 03/08/2017  . Gait disturbance, post-stroke 03/08/2017  . Acute arterial ischemic stroke, vertebrobasilar, thalamic, left ([place]) 01/10/2017  . Neurologic gait disorder   . Depression   . Benign essential HTN   . History of breast cancer   . Stroke (cerebrum) ([place]) 01/08/2017  . TIA (transient ischemic attack) 12/16/2016  . Dysarthria 12/16/2016  . Leg weakness 12/16/2016  . EKG abnormalities 12/16/2016  . Chronic pain 12/16/2016  . Elevated blood pressure 12/22/2014  . Internal hemorrhoids with bleeding and prolapse 11/04/2014  . Lactose intolerance 11/04/2014  . Hemorrhoid 09/11/2014  . Rectal bleeding 09/11/2014  . IBS (irritable bowel syndrome) 09/25/2013  . Hyperparathyroidism, primary ([place]) 05/26/2011  . Osteoporosis 05/09/2009  . POLYCYTHEMIA  04/23/2009  . IMPAIRED FASTING GLUCOSE 11/14/2008  . BREAST CANCER, HX OF 12/06/2007  . Anxiety state 10/13/2007  . Essential hypertension 10/13/2007  . INTERSTITIAL CYSTITIS 09/06/2007  . HLD (hyperlipidemia) 03/01/2007  . Backache 03/01/2007   Deneise Lever, [place], [place] E Purity Irmen 03/24/2017, 5:55 PM  [facility] [address] [place], [place], [address] Phone: [phone]   Fax:  [phone]   Name: [name] MRN: [id number] Date of Birth: Jan 20, 1930

## 2017-03-28 ENCOUNTER — Ambulatory Visit: Payer: Medicare Other | Admitting: Physical Therapy

## 2017-03-28 ENCOUNTER — Ambulatory Visit: Payer: Medicare Other | Admitting: Speech Pathology

## 2017-03-28 ENCOUNTER — Encounter: Payer: Self-pay | Admitting: Physical Therapy

## 2017-03-28 DIAGNOSIS — R2681 Unsteadiness on feet: Secondary | ICD-10-CM

## 2017-03-28 DIAGNOSIS — R42 Dizziness and giddiness: Secondary | ICD-10-CM

## 2017-03-28 DIAGNOSIS — R41841 Cognitive communication deficit: Secondary | ICD-10-CM

## 2017-03-28 DIAGNOSIS — I69351 Hemiplegia and hemiparesis following cerebral infarction affecting right dominant side: Secondary | ICD-10-CM

## 2017-03-28 DIAGNOSIS — R2689 Other abnormalities of gait and mobility: Secondary | ICD-10-CM

## 2017-03-28 NOTE — Therapy (Signed)
[facility] [address] [place], [place], [address] Phone: [phone]   Fax:  [phone]  Physical Therapy Treatment  Patient Details  Name: [name] MRN: [id number] Date of Birth: June 19, 1930 Referring Provider: Dr. Letta Pate  Encounter Date: 03/28/2017      PT End of Session - 03/28/17 1657    Visit Number 4   Number of Visits 9   Date for PT Re-Evaluation 04/09/17   Authorization Type Medicare and BCBS Federal: G-CODE AND PN EVERY 10TH VISIT.    PT Start Time 1318   PT Stop Time 1405   PT Time Calculation (min) 47 min   Equipment Utilized During Treatment --  min guard to min A prn   Activity Tolerance Patient tolerated treatment well   Behavior During Therapy Impulsive      Past Medical History:  Diagnosis Date  . Adenomatous colon polyp 04/2005  . Allergic    Anectine  . ANXIETY 10/13/2007  . BACK PAIN 03/01/2007  . BREAST CANCER, HX OF 12/06/2007  . Cancer [facility])    RIGHT mastectomy with node dissection  . Complication of anesthesia    family aggergy to annectine  . Compression fracture of L4 lumbar vertebra (HCC) 02/2013  . CONSTIPATION 01/06/2009  . DEPRESSION 10/13/2007  . Diverticular disease   . DIVERTICULOSIS, COLON 05/11/2005  . External hemorrhoids without mention of complication 11/13/5571  . FATIGUE 08/07/2008  . HEMORRHOIDS, INTERNAL 05/11/2005  . Hypercalcemia 04/23/2009  . HYPERLIPIDEMIA NEC/NOS 03/01/2007  . HYPERTENSION 10/13/2007  . Impaired fasting glucose 11/14/2008  . INTERSTITIAL CYSTITIS 09/06/2007  . Irritable bowel syndrome 01/06/2009  . LIVER MASS 05/15/2009  . OSTEOPOROSIS 05/09/2009  . PARESTHESIA 09/06/2007  . POLYCYTHEMIA 04/23/2009  . STENOSIS, SPINAL, UNSPC REGION 03/01/2007  . Thyroid disease     Past Surgical History:  Procedure Laterality Date  . ABDOMINAL HYSTERECTOMY    . BACK SURGERY    . BREAST SURGERY     mastectomy -right  . CERVICAL SPINE SURGERY    .  CHOLECYSTECTOMY    . COLONOSCOPY W/ BIOPSIES    . cortisone injection  10/16/12   left knee  . FIXATION KYPHOPLASTY LUMBAR SPINE    . HEMORRHOID BANDING  2015-16  . PARATHYROID EXPLORATION  06/29/2011   Procedure: PARATHYROID EXPLORATION;  Surgeon: Earnstine Regal, MD;  Location: [facility];  Service: General;  Laterality: Left;  Left Superior Parathyroidectomy  . PARATHYROIDECTOMY  06/29/11    There were no vitals filed for this visit.      Subjective Assessment - 03/28/17 1323    Subjective Pt reporting walking a lot over the weekend due to errands and going out to dinner with family but did not do HEP.  Reports climbing on rocks to clean water garden, no falls but did strain muscles.  Still a little sore today.   Pertinent History HTN, interstitial cystitis, HLD, hyperparathyroidism, cognitive deficits from CVA, hx of R breast CA s/p mastectomy, R hand parasthesia after CVA, osteoporosis, depression, anxiety   Patient Stated Goals Be able to do the things I want to do.   Currently in Pain? Yes  soreness in back and legs                         [facility] Adult PT Treatment/Exercise - 03/28/17 1359      Ambulation/Gait   Ambulation/Gait Yes   Ambulation/Gait Assistance 4: Min assist   Ambulation/Gait Assistance Details cues  for more upright trunk, full step and stride length and foot clearance by cuing to hit with heel each time   Ambulation Distance (Feet) 200 Feet   Assistive device Rollator   Gait Pattern Step-to pattern;Decreased step length - right;Decreased step length - left;Decreased stride length;Decreased weight shift to right;Decreased weight shift to left;Shuffle;Lateral hip instability;Lateral trunk lean to right;Trunk flexed;Poor foot clearance - left;Poor foot clearance - right   Ambulation Surface Level;Indoor     Therapeutic Activites    Other Therapeutic Activities Vitals assessed due to increased perspiration, increased shuffling gait and increased R lateral  lean.  Sitting 152/75, 76 bpm; standing 154/69, 78 bpm-no lightheadedness.  Caregiver states that pt has bad days like this and feels it is because the pt is very tired and having pain from weekend activities.               Balance Exercises - 03/28/17 1327      Balance Exercises: Standing   Tandem Stance Eyes open;Foam/compliant surface;Upper extremity support 1;3 reps;10 secs  mod-max A for postural control/balance   SLS Eyes open;Foam/compliant surface;Upper extremity support 1;3 reps;10 secs   Balance Beam Stepping over beam x 10 reps each LE with one UE support with focus on lateral and anterior/posterior weight shifting and full foot clearance each LE     OTAGO PROGRAM   Heel Walking Support  2 reps-difficult with pt wedge shoes, compliant   Toe Walk Support  2 reps-difficult with pt wedge shoes, compliant           PT Education - 03/28/17 1656    Education provided Yes   Education Details safety and activities unsafe for pt do to currently   Person(s) Educated Patient;Caregiver(s)   Methods Explanation   Comprehension Verbalized understanding          PT Short Term Goals - 03/10/17 1200      PT SHORT TERM GOAL #1   Title same as LTGs           PT Long Term Goals - 03/28/17 1711      PT LONG TERM GOAL #1   Title Pt will perform HEP at MOD I level with caregiver's assist to improve balance, strength, dizziness, and safety. TARGET DATE FOR ALL LTGS: 04/07/17   Status New     PT LONG TERM GOAL #2   Title Pt will improve DGI score to >/=15/24 to decr. falls risk.    Status New     PT LONG TERM GOAL #3   Title Pt will amb. 500' over even/uneven terrain with LRAD at MOD I level to improve functional mobility.    Status New     PT LONG TERM GOAL #4   Title Perform vestibular assessment and write goals prn.   Baseline Goal not indicated; no further reports of dizziness   Status Deferred               Plan - 03/28/17 1704    Clinical  Impression Statement Treatment session today with continued focus on dynamic balance, weight shifting between LE, proximal hip strengthening and SLS to improve stance phase of gait for improved step length and foot clearance due to shuffling gait.  Pt with greater difficulty following commands today, required frequent rest breaks and noted to be clammy with increased R lateral lean, increased shuffling and increased LOB.  Vitals assessed-WFL in sitting and standing.  Alerted speech therapist.  Therapists continue to be concerned about pt being at home  alone when [place] not present due to high risk for falls and impaired cognition, awareness of deficits and impaired safety awareness as indicated by pt decision to move rocks and clean water garden outside.  Will attempt to discuss with family member.   Rehab Potential Fair  based on cognition   Clinical Impairments Affecting Rehab Potential see above   PT Frequency 2x / week   PT Duration 4 weeks   PT Treatment/Interventions ADLs/Self Care Home Management;Biofeedback;Canalith Repostioning;Electrical Stimulation;Cognitive remediation;Neuromuscular re-education;Balance training;Therapeutic exercise;Therapeutic activities;Functional mobility training;Stair training;Gait training;DME Instruction;Orthotic Fit/Training;Patient/family education;Vestibular   PT Next Visit Plan activities to simulate gardening/kneeling/floor transfer; continue to add exercises to HEP; R NMR, weight shifting, gait, RLE clearance   Consulted and Agree with Plan of Care Patient;Family member/caregiver   Family Member Consulted caregiver-Shirley      Patient will benefit from skilled therapeutic intervention in order to improve the following deficits and impairments:  Abnormal gait, Decreased endurance, Decreased knowledge of use of DME, Decreased balance, Decreased mobility, Dizziness, Decreased coordination, Decreased safety awareness, Impaired flexibility, Postural dysfunction,  Decreased strength, Pain, Impaired sensation (PT will monitor pain but will not directly address)  Visit Diagnosis: Other abnormalities of gait and mobility  Hemiplegia and hemiparesis following cerebral infarction affecting right dominant side (HCC)  Dizziness and giddiness  Unsteadiness on feet     Problem List Patient Active Problem List   Diagnosis Date Noted  . Cognitive deficit due to recent stroke 03/08/2017  . Alteration of sensation as late effect of stroke 03/08/2017  . Gait disturbance, post-stroke 03/08/2017  . Acute arterial ischemic stroke, vertebrobasilar, thalamic, left ([place]) 01/10/2017  . Neurologic gait disorder   . Depression   . Benign essential HTN   . History of breast cancer   . Stroke (cerebrum) ([place]) 01/08/2017  . TIA (transient ischemic attack) 12/16/2016  . Dysarthria 12/16/2016  . Leg weakness 12/16/2016  . EKG abnormalities 12/16/2016  . Chronic pain 12/16/2016  . Elevated blood pressure 12/22/2014  . Internal hemorrhoids with bleeding and prolapse 11/04/2014  . Lactose intolerance 11/04/2014  . Hemorrhoid 09/11/2014  . Rectal bleeding 09/11/2014  . IBS (irritable bowel syndrome) 09/25/2013  . Hyperparathyroidism, primary ([place]) 05/26/2011  . Osteoporosis 05/09/2009  . POLYCYTHEMIA 04/23/2009  . IMPAIRED FASTING GLUCOSE 11/14/2008  . BREAST CANCER, HX OF 12/06/2007  . Anxiety state 10/13/2007  . Essential hypertension 10/13/2007  . INTERSTITIAL CYSTITIS 09/06/2007  . HLD (hyperlipidemia) 03/01/2007  . Backache 03/01/2007    Raylene Everts, PT, DPT 03/28/17    5:12 PM    [facility] [address] [place], [place], [address] Phone: [phone]   Fax:  [phone]  Name: [name] MRN: [id number] Date of Birth: 02/18/1930

## 2017-03-28 NOTE — Therapy (Signed)
[facility] [address] [place], [place], [address] Phone: [phone]   Fax:  [phone]  Speech Language Pathology Treatment  Patient Details  Name: [name] MRN: [id number] Date of Birth: [birth year] Referring Provider: Dr. Alysia Penna  Encounter Date: 03/28/2017      End of Session - 03/28/17 1714    Visit Number 4   Number of Visits 17   Date for SLP Re-Evaluation 05/13/17   SLP Start Time 7517   SLP Stop Time  1446   SLP Time Calculation (min) 43 min   Activity Tolerance Patient tolerated treatment well      Past Medical History:  Diagnosis Date  . Adenomatous colon polyp 04/2005  . Allergic    Anectine  . ANXIETY 10/13/2007  . BACK PAIN 03/01/2007  . BREAST CANCER, HX OF 12/06/2007  . Cancer [facility])    RIGHT mastectomy with node dissection  . Complication of anesthesia    family aggergy to annectine  . Compression fracture of L4 lumbar vertebra (HCC) 02/2013  . CONSTIPATION 01/06/2009  . DEPRESSION 10/13/2007  . Diverticular disease   . DIVERTICULOSIS, COLON 05/11/2005  . External hemorrhoids without mention of complication 0/08/7492  . FATIGUE 08/07/2008  . HEMORRHOIDS, INTERNAL 05/11/2005  . Hypercalcemia 04/23/2009  . HYPERLIPIDEMIA NEC/NOS 03/01/2007  . HYPERTENSION 10/13/2007  . Impaired fasting glucose 11/14/2008  . INTERSTITIAL CYSTITIS 09/06/2007  . Irritable bowel syndrome 01/06/2009  . LIVER MASS 05/15/2009  . OSTEOPOROSIS 05/09/2009  . PARESTHESIA 09/06/2007  . POLYCYTHEMIA 04/23/2009  . STENOSIS, SPINAL, UNSPC REGION 03/01/2007  . Thyroid disease     Past Surgical History:  Procedure Laterality Date  . ABDOMINAL HYSTERECTOMY    . BACK SURGERY    . BREAST SURGERY     mastectomy -right  . CERVICAL SPINE SURGERY    . CHOLECYSTECTOMY    . COLONOSCOPY W/ BIOPSIES    . cortisone injection  10/16/12   left knee  . FIXATION KYPHOPLASTY LUMBAR SPINE    . HEMORRHOID BANDING  2015-16  .  PARATHYROID EXPLORATION  06/29/2011   Procedure: PARATHYROID EXPLORATION;  Surgeon: Earnstine Regal, MD;  Location: [facility];  Service: General;  Laterality: Left;  Left Superior Parathyroidectomy  . PARATHYROIDECTOMY  06/29/11    There were no vitals filed for this visit.      Subjective Assessment - 03/28/17 1406    Subjective "I guess I overdid it." Pt re: moving rocks and cleaning her water garden over the weekend   Currently in Pain? Yes   Pain Score 8    Pain Location Back   Pain Onset In the past 7 days   Aggravating Factors  moving rocks this weekend               ADULT SLP TREATMENT - 03/28/17 1402      General Information   Behavior/Cognition Alert;Cooperative   Patient Positioning Upright in chair   Oral care provided N/A     Treatment Provided   Treatment provided Cognitive-Linquistic     Cognitive-Linquistic Treatment   Treatment focused on Cognition   Skilled Treatment SLP focused session on safety awareness as PT reported pt having difficulty walking today after cleaning out her water garden this weekend. With max cues pt inconsistently acknowledged that this activity was unsafe given her balance and weakness. Pt stated she was attempting to make her muscles stronger; she reports she has not completed PT exercises at all. Pt does acknowledge "weakness" and "  balance problems."  Pt stated, "If they tell me I can't do something, I want to do it more." SLP attempted to facilitate anticipatory awareness of impacts on daily activities she performed prior to CVA by having pt list things she can do safely by herself, with assistance from caregiver, and things she "wants to do" (things she cannot do safely). Pt to complete this list at home with caregiver.     Assessment / Recommendations / Plan   Plan Goals updated     Progression Toward Goals   Progression toward goals Goals downgraded          SLP Education - 03/28/17 1713    Education provided Yes   Education  Details safety and activities unsafe for pt to do currently   Person(s) Educated Patient;Caregiver(s)   Methods Explanation;Verbal cues   Comprehension Verbalized understanding;Verbal cues required;Need further instruction          SLP Short Term Goals - 03/28/17 1647      SLP SHORT TERM GOAL #1   Title Pt will verbalize 2 deficits from her stroke and potential impacts on function/safety given min cues   Time 2   Period Weeks   Status Revised   Target Date 04/15/17     SLP SHORT TERM GOAL #2   Title Pt will complete thought organization, problem solving, and reasoning tasks with 80% accuracy given mod cues   Time 2   Period Weeks   Status Deferred   Target Date 04/15/17     SLP SHORT TERM GOAL #3   Title Pt will complete simple attention to detail tasks with 80% accuracy given min assist   Time 2   Period Weeks   Status On-going   Target Date 04/15/17     SLP SHORT TERM GOAL #4   Title Pt will identify functional method of improving functional recall, and will bring it to therapy each session   Time 2   Period Weeks   Status On-going   Target Date 04/15/17          SLP Long Term Goals - 03/28/17 1650      SLP LONG TERM GOAL #1   Title Pt will verbalize compensatory strategies for areas of deficit with min cues   Time 6   Period Weeks   Status On-going   Target Date 05/13/17     SLP LONG TERM GOAL #2   Title Pt will complete Level II/III thought organization, reasoning, problem solving tasks with 80% accuracy given min cues   Time 6   Period Weeks   Status Deferred   Target Date 05/13/17     SLP LONG TERM GOAL #3   Title Pt will complete selective/alternating/divided attention tasks with 80% accuracy given min cues, in distracting environment   Time 6   Period Weeks   Status Deferred   Target Date 05/13/17     SLP LONG TERM GOAL #4   Title Pt will demonstrate anticipatory awareness of level of assistance needed to complete desired activities with min  A.   Time 6   Period Weeks   Status New          Plan - 03/28/17 1715    Clinical Impression Statement Patient presents with baseline deficits in functional recall and short term memory. She has severely impaired insight into her new physical and cognitive deficits s/p CVA which impact her safety awareness and safety at home. Goals downgraded today to target anticipatory awareness and safety.  Provided extensive education today to pt and caregiver re: activities which are not safe for pt to do at home. Pt and caregiver to continue making a list of activities pt can do safely vs activities which are not safe for her to do.    Speech Therapy Frequency 2x / week   Treatment/Interventions Compensatory strategies;Functional tasks;Patient/family education;Internal/external aids;Compensatory techniques;Multimodal communcation approach   Potential to Achieve Goals Fair   Potential Considerations Family/community support;Previous level of function;Cooperation/participation level;Ability to learn/carryover information   SLP Home Exercise Plan list activities which are safe and unsafe   Consulted and Agree with Plan of Care Patient;Family member/caregiver   Family Member Consulted friend/caregiver Enid Derry      Patient will benefit from skilled therapeutic intervention in order to improve the following deficits and impairments:   Cognitive communication deficit    Problem List Patient Active Problem List   Diagnosis Date Noted  . Cognitive deficit due to recent stroke 03/08/2017  . Alteration of sensation as late effect of stroke 03/08/2017  . Gait disturbance, post-stroke 03/08/2017  . Acute arterial ischemic stroke, vertebrobasilar, thalamic, left ([place]) 01/10/2017  . Neurologic gait disorder   . Depression   . Benign essential HTN   . History of breast cancer   . Stroke (cerebrum) ([place]) 01/08/2017  . TIA (transient ischemic attack) 12/16/2016  . Dysarthria 12/16/2016  . Leg weakness  12/16/2016  . EKG abnormalities 12/16/2016  . Chronic pain 12/16/2016  . Elevated blood pressure 12/22/2014  . Internal hemorrhoids with bleeding and prolapse 11/04/2014  . Lactose intolerance 11/04/2014  . Hemorrhoid 09/11/2014  . Rectal bleeding 09/11/2014  . IBS (irritable bowel syndrome) 09/25/2013  . Hyperparathyroidism, primary ([place]) 05/26/2011  . Osteoporosis 05/09/2009  . POLYCYTHEMIA 04/23/2009  . IMPAIRED FASTING GLUCOSE 11/14/2008  . BREAST CANCER, HX OF 12/06/2007  . Anxiety state 10/13/2007  . Essential hypertension 10/13/2007  . INTERSTITIAL CYSTITIS 09/06/2007  . HLD (hyperlipidemia) 03/01/2007  . Backache 03/01/2007   Deneise Lever, [place], CCC-SLP Speech-Language Pathologist  Aliene Altes 03/28/2017, [facility] [address] [place], [place], [address] Phone: [phone]   Fax:  [phone]   Name: [name] MRN: [id number] Date of Birth: September 06, 1929

## 2017-04-01 ENCOUNTER — Ambulatory Visit: Payer: Medicare Other | Admitting: Physical Therapy

## 2017-04-01 ENCOUNTER — Encounter: Payer: Self-pay | Admitting: Physical Therapy

## 2017-04-01 DIAGNOSIS — R2681 Unsteadiness on feet: Secondary | ICD-10-CM

## 2017-04-01 DIAGNOSIS — I69351 Hemiplegia and hemiparesis following cerebral infarction affecting right dominant side: Secondary | ICD-10-CM

## 2017-04-01 DIAGNOSIS — R41841 Cognitive communication deficit: Secondary | ICD-10-CM | POA: Diagnosis not present

## 2017-04-01 DIAGNOSIS — R2689 Other abnormalities of gait and mobility: Secondary | ICD-10-CM

## 2017-04-01 NOTE — Therapy (Signed)
[facility] [address] [place], [place], [address] Phone: [phone]   Fax:  [phone]  Physical Therapy Treatment  Patient Details  Name: [name] MRN: [id number] Date of Birth: 29-Jun-1930 Referring Provider: Dr. Letta Pate  Encounter Date: 04/01/2017      PT End of Session - 04/01/17 1111    Visit Number 5   Number of Visits 9   Date for PT Re-Evaluation 04/09/17   Authorization Type Medicare and BCBS Federal: G-CODE AND PN EVERY 10TH VISIT.    PT Start Time 1104   PT Stop Time 1215   PT Time Calculation (min) 71 min   Equipment Utilized During Treatment --  min guard to min A prn   Activity Tolerance Patient tolerated treatment well   Behavior During Therapy Impulsive      Past Medical History:  Diagnosis Date  . Adenomatous colon polyp 04/2005  . Allergic    Anectine  . ANXIETY 10/13/2007  . BACK PAIN 03/01/2007  . BREAST CANCER, HX OF 12/06/2007  . Cancer [facility])    RIGHT mastectomy with node dissection  . Complication of anesthesia    family aggergy to annectine  . Compression fracture of L4 lumbar vertebra (HCC) 02/2013  . CONSTIPATION 01/06/2009  . DEPRESSION 10/13/2007  . Diverticular disease   . DIVERTICULOSIS, COLON 05/11/2005  . External hemorrhoids without mention of complication 3/71/0626  . FATIGUE 08/07/2008  . HEMORRHOIDS, INTERNAL 05/11/2005  . Hypercalcemia 04/23/2009  . HYPERLIPIDEMIA NEC/NOS 03/01/2007  . HYPERTENSION 10/13/2007  . Impaired fasting glucose 11/14/2008  . INTERSTITIAL CYSTITIS 09/06/2007  . Irritable bowel syndrome 01/06/2009  . LIVER MASS 05/15/2009  . OSTEOPOROSIS 05/09/2009  . PARESTHESIA 09/06/2007  . POLYCYTHEMIA 04/23/2009  . STENOSIS, SPINAL, UNSPC REGION 03/01/2007  . Thyroid disease     Past Surgical History:  Procedure Laterality Date  . ABDOMINAL HYSTERECTOMY    . BACK SURGERY    . BREAST SURGERY     mastectomy -right  . CERVICAL SPINE SURGERY    .  CHOLECYSTECTOMY    . COLONOSCOPY W/ BIOPSIES    . cortisone injection  10/16/12   left knee  . FIXATION KYPHOPLASTY LUMBAR SPINE    . HEMORRHOID BANDING  2015-16  . PARATHYROID EXPLORATION  06/29/2011   Procedure: PARATHYROID EXPLORATION;  Surgeon: Earnstine Regal, MD;  Location: [facility];  Service: General;  Laterality: Left;  Left Superior Parathyroidectomy  . PARATHYROIDECTOMY  06/29/11    There were no vitals filed for this visit.      Subjective Assessment - 04/01/17 1107    Subjective Per caregiver report, pt did HEP and walking was good on 8/7, but she was very unsteady and shaky on 8/8 so didn't try HEP.  Pt continues to state: "I think we should work on what I'm having a hard time with and this right hand continues to hurt!".     Patient is accompained by: --  caregiver, Enid Derry   Pertinent History HTN, interstitial cystitis, HLD, hyperparathyroidism, cognitive deficits from CVA, hx of R breast CA s/p mastectomy, R hand parasthesia after CVA, osteoporosis, depression, anxiety   Patient Stated Goals Be able to do the things I want to do.   Currently in Pain? Yes   Pain Score 8    Pain Location Hand   Pain Orientation Right   Pain Descriptors / Indicators Numbness;Pins and needles   Pain Type Neuropathic pain   Pain Relieving Factors [facility]   Effect of Pain  on Daily Activities difficult to use hand      Pt continues to present with significant gait and balance impairments as well as multiple cognitive impairments placing pt at increased falls risk and risk for injury.  PT and SLP continue to be concerned about pt's safety during the weekend due to lack of 24/7 supervision.  Per CIR social work notes, pt's POA was going to be setting up weekend supervision coverage and to date, pt continues to stay alone on the weekends.  Last weekend while alone pt reports moving and cleaning rocks around water garden outside and "straining her muscles" causing fatigue and pain; pt denied a fall.   Shirley-pt's weekday caregiver-approached PT today to alert PT that pt is adamant about driving again and caregiver would like therapists to provide physiatrist with recommendations regarding safety with driving.   At end of session PT requested permission from patient to contact POA to discuss ongoing safety concerns, falls risk and continued recommendation for supervision through the weekend.  Also discussed with pt PT's concerns with driving due to impaired motor planning, impaired sensation, decreased reaction time and cognitive impairments that would make it unsafe for pt to drive right now.  Pt became very emotionally distressed with PT's request to contact POA and recommendations for driving. Pt expressed that she believes that any issues she has with dizziness, balance and walking were there before the stroke, were the result of pain and one leg being shorter than the other and they did not limit her from driving before.  Pt continues to state that the only deficit from the stroke was pain in her right hand.  Pt also expressed that PT does not have her permission to call her POA and there is no need to call the POA to talk about more help on the weekends because she can't pay for it financially and her husband and step son will not give her any money.  Pt states, "I don't want any more pain so I am not going to do anything risky to cause myself more pain".  Reminded pt of last weekend when she was working on the rocks at her water garden.  Pt stated, "I was on the backside of the pond and if I had fallen the ground isn't that hard, cement is, but the ground isn't and that's why I have this (life alert) on my arm!".  Pt refused to let PT contact POA and is refusing further assistance on the weekend due to financial constraints.  Regarding driving, pt expressed that she has to be able to drive herself so that she can go the grocery store.  [place] from  family or neighbors.  Pt states, "and I don't want you to make a recommendation about my driving that will be detrimental to me and my life".  Pt stating she wanted a second opinion from another therapist about how she is doing and if she can drive or not.  Acknowledged patient's frustration with her situation and limited support.  Also continued to stress with pt that regardless of whether the impairments were pre-morbid or as a result of the CVA, they do place her at increased risk for fall and injury and make it unsafe for her to drive and that therapy's recommendation would continue to be for her have 24/7 supervision and to have someone provide transportation for her.    Pt continues to present with impaired  memory, impaired awareness of deficits, impaired safety awareness and defensiveness when therapist discussing impairments with her.  At end of conversation PT ambulated with pt to waiting area where Enid Derry was waiting to assist her to the car.  Will alert PT that is working with pt next week of situation and will alert physiatrist and neuropsychologist of ongoing safety concerns and patient's refusal to allow therapist to discuss concerns with family/POA, refusal to have weekend supervision, lack of insight and patient's strong desire to return to driving.  Raylene Everts, PT, DPT 04/02/17    1:49 PM        [facility] Adult PT Treatment/Exercise - 04/01/17 1132      Ambulation/Gait   Ambulation/Gait --     High Level Balance   High Level Balance Activities Backward walking;Other (comment)  Walking forward on heels, backward normal   High Level Balance Comments 5 laps along counter with no UE support while holding 1 kg medicine ball in R hand at approx 45 deg shoulder flexion/90 deg elbow flexion. Requires CGA for balance and constant visual, verbal and tactile cues from PT to weight shift, keep ball elevated.           PWR [facility]) - 04/01/17 1128    PWR! exercises Moves in sitting   PWR!  Up 10x. Pt requires constant demo, visual and verbal cues from PT to perfomr   PWR! Rock 10x per side; Pt requires constant demo, visual and verbal cues of PT to perform correctly. Some minor LOB due to lifting leg but no falls   PWR! Twist 10x per side; Pt requires constant demo, visual and verbal cues of PT to perform correctly   PWR! Step 10x per side; Pt requires constant demo, visual and verbal cues of PT to perform correctly. Pt c/o pain in R lower back/upper glute area. Exercise stopped.          Balance Exercises - 04/01/17 1227      Balance Exercises: Standing   SLS Upper extremity support 1;Limitations;Other reps (comment)  8 reps per leg   Sidestepping 2 reps;Limitations     Balance Exercises: Standing   SLS Limitations Performed standing at counter for L UE support while overhead pressing 1 kg medicine ball. Requires constant visual and verbal cues from PT to perform correctly, keep leg elevated.   Sidestepping Limitations Performed along counter with no UE support while holding 1kg medicine ball in R hand at 90 deg shoulder abd/90 deg elbow flexion. Requires CGA for balance and constant visual, verbal and tactile cues from PT to weight shift, keep ball elevated.            PT Education - 04/01/17 1235    Education Details PT's clinical impression of effects of patient's balance and gait deficits on her safety and the importance of having someone with her 24 hours a day.   Person(s) Educated Patient   Methods Explanation   Comprehension Other (comment)  Pt says unable to afford 24/7 care. Also denies deficits related to stroke.          PT Short Term Goals - 03/10/17 1200      PT SHORT TERM GOAL #1   Title same as LTGs           PT Long Term Goals - 03/28/17 1711      PT LONG TERM GOAL #1   Title Pt will perform HEP at MOD I level with caregiver's assist to improve balance, strength, dizziness, and  safety. TARGET DATE FOR ALL LTGS: 04/07/17   Status New      PT LONG TERM GOAL #2   Title Pt will improve DGI score to >/=15/24 to decr. falls risk.    Status New     PT LONG TERM GOAL #3   Title Pt will amb. 500' over even/uneven terrain with LRAD at MOD I level to improve functional mobility.    Status New     PT LONG TERM GOAL #4   Title Perform vestibular assessment and write goals prn.   Baseline Goal not indicated; no further reports of dizziness   Status Deferred               Plan - 04/01/17 1241    Clinical Impression Statement Today's session focused on activities to improve seated and standing dynamic balance and weight shifting while incorporating R UE strengthening activities (per patient's request).  Pt ambulating with quad cane today, not rollator; Pt required cues to turn cane correct direction, to hold on L side due to provide stability to impaired R side and adjusted to proper height for pt. Pt continues to have difficulty following commands, requiring constant cueing from PT for sequencing, weight shift and postural control, with CGA to minA to prevent LOB in standing.  Initiated discussion with patient regarding impairments, safety concerns and falls risk and therapist's ongoing recommendation for 24/7 including weekends as well as recommendation to not return to driving right now.  See full detail in note.  Will continue to discuss as needed with pt and caregiver but pt refused to give therapist permission to contact and discuss with POA/family.     History and Personal Factors relevant to plan of care: Poor historian. [place] family support and no assistance present on weekends.   Rehab Potential Fair  based on cognition   Clinical Impairments Affecting Rehab Potential see above   PT Frequency 2x / week   PT Duration 4 weeks   PT Treatment/Interventions ADLs/Self Care Home Management;Biofeedback;Canalith Repostioning;Electrical Stimulation;Cognitive remediation;Neuromuscular  re-education;Balance training;Therapeutic exercise;Therapeutic activities;Functional mobility training;Stair training;Gait training;DME Instruction;Orthotic Fit/Training;Patient/family education;Vestibular   PT Next Visit Plan Will need to re-assess LTG and decide whether or not to recertify for more visits within next 1-2 visits; R NMR, dynamic balance activities incorporating R UE strengthening, ambulation using SPC-continues to shuffle with LOB to R at times.  Initiated PWR! exercises in sitting due to pt having parkinson-like gait.   Consulted and Agree with Plan of Care Patient;Family member/caregiver   Family Member Consulted caregiver-Shirley      Patient will benefit from skilled therapeutic intervention in order to improve the following deficits and impairments:  Abnormal gait, Decreased endurance, Decreased knowledge of use of DME, Decreased balance, Decreased mobility, Dizziness, Decreased coordination, Decreased safety awareness, Impaired flexibility, Postural dysfunction, Decreased strength, Pain, Impaired sensation (PT will monitor pain but will not directly address)  Visit Diagnosis: Other abnormalities of gait and mobility  Hemiplegia and hemiparesis following cerebral infarction affecting right dominant side (HCC)  Unsteadiness on feet     Problem List Patient Active Problem List   Diagnosis Date Noted  . Cognitive deficit due to recent stroke 03/08/2017  . Alteration of sensation as late effect of stroke 03/08/2017  . Gait disturbance, post-stroke 03/08/2017  . Acute arterial ischemic stroke, vertebrobasilar, thalamic, left ([place]) 01/10/2017  . Neurologic gait disorder   . Depression   . Benign essential HTN   . History of breast cancer   .  Stroke (cerebrum) ([place]) 01/08/2017  . TIA (transient ischemic attack) 12/16/2016  . Dysarthria 12/16/2016  . Leg weakness 12/16/2016  . EKG abnormalities 12/16/2016  . Chronic pain 12/16/2016  . Elevated blood pressure  12/22/2014  . Internal hemorrhoids with bleeding and prolapse 11/04/2014  . Lactose intolerance 11/04/2014  . Hemorrhoid 09/11/2014  . Rectal bleeding 09/11/2014  . IBS (irritable bowel syndrome) 09/25/2013  . Hyperparathyroidism, primary ([place]) 05/26/2011  . Osteoporosis 05/09/2009  . POLYCYTHEMIA 04/23/2009  . IMPAIRED FASTING GLUCOSE 11/14/2008  . BREAST CANCER, HX OF 12/06/2007  . Anxiety state 10/13/2007  . Essential hypertension 10/13/2007  . INTERSTITIAL CYSTITIS 09/06/2007  . HLD (hyperlipidemia) 03/01/2007  . Backache 03/01/2007    Gershon Crane, SPT 04/01/2017, 4:50 PM  [facility] [address] [place], [place], [address] Phone: [phone]   Fax:  [phone]  Name: SONAKSHI ROLLAND MRN: [id number] Date of Birth: November 08, 1929

## 2017-04-07 ENCOUNTER — Ambulatory Visit: Payer: Medicare Other

## 2017-04-07 ENCOUNTER — Ambulatory Visit: Payer: Medicare Other | Admitting: Speech Pathology

## 2017-04-07 DIAGNOSIS — R2689 Other abnormalities of gait and mobility: Secondary | ICD-10-CM

## 2017-04-07 DIAGNOSIS — R41841 Cognitive communication deficit: Secondary | ICD-10-CM

## 2017-04-07 DIAGNOSIS — R2681 Unsteadiness on feet: Secondary | ICD-10-CM

## 2017-04-07 DIAGNOSIS — I69351 Hemiplegia and hemiparesis following cerebral infarction affecting right dominant side: Secondary | ICD-10-CM

## 2017-04-07 NOTE — Therapy (Signed)
[facility] [address] [place], [place], [address] Phone: [phone]   Fax:  [phone]  Speech Language Pathology Treatment  Patient Details  Name: [name] MRN: [id number] Date of Birth: [birth year] Referring Provider: Dr. Alysia Penna  Encounter Date: 04/07/2017      End of Session - 04/07/17 1748    Visit Number 5   Number of Visits 17   Date for SLP Re-Evaluation 05/13/17   SLP Start Time 76   SLP Stop Time  1445   SLP Time Calculation (min) 44 min   Activity Tolerance Treatment limited secondary to agitation      Past Medical History:  Diagnosis Date  . Adenomatous colon polyp 04/2005  . Allergic    Anectine  . ANXIETY 10/13/2007  . BACK PAIN 03/01/2007  . BREAST CANCER, HX OF 12/06/2007  . Cancer [facility])    RIGHT mastectomy with node dissection  . Complication of anesthesia    family aggergy to annectine  . Compression fracture of L4 lumbar vertebra (HCC) 02/2013  . CONSTIPATION 01/06/2009  . DEPRESSION 10/13/2007  . Diverticular disease   . DIVERTICULOSIS, COLON 05/11/2005  . External hemorrhoids without mention of complication 6/38/4536  . FATIGUE 08/07/2008  . HEMORRHOIDS, INTERNAL 05/11/2005  . Hypercalcemia 04/23/2009  . HYPERLIPIDEMIA NEC/NOS 03/01/2007  . HYPERTENSION 10/13/2007  . Impaired fasting glucose 11/14/2008  . INTERSTITIAL CYSTITIS 09/06/2007  . Irritable bowel syndrome 01/06/2009  . LIVER MASS 05/15/2009  . OSTEOPOROSIS 05/09/2009  . PARESTHESIA 09/06/2007  . POLYCYTHEMIA 04/23/2009  . STENOSIS, SPINAL, UNSPC REGION 03/01/2007  . Thyroid disease     Past Surgical History:  Procedure Laterality Date  . ABDOMINAL HYSTERECTOMY    . BACK SURGERY    . BREAST SURGERY     mastectomy -right  . CERVICAL SPINE SURGERY    . CHOLECYSTECTOMY    . COLONOSCOPY W/ BIOPSIES    . cortisone injection  10/16/12   left knee  . FIXATION KYPHOPLASTY LUMBAR SPINE    . HEMORRHOID BANDING   2015-16  . PARATHYROID EXPLORATION  06/29/2011   Procedure: PARATHYROID EXPLORATION;  Surgeon: Earnstine Regal, MD;  Location: [facility];  Service: General;  Laterality: Left;  Left Superior Parathyroidectomy  . PARATHYROIDECTOMY  06/29/11    There were no vitals filed for this visit.      Subjective Assessment - 04/07/17 1408    Subjective "There's certain things I need to prove to myself." (pt arrives without RW or cane)   Currently in Pain? Yes   Pain Score 7    Pain Location Hand   Pain Orientation --  thumb is worst   Pain Descriptors / Indicators --  pins and needles   Pain Type Chronic pain   Aggravating Factors  mashing things, using hand   Pain Relieving Factors [facility]               ADULT SLP TREATMENT - 04/07/17 1411      General Information   Behavior/Cognition Alert;Cooperative   Patient Positioning Upright in chair   Oral care provided N/A     Treatment Provided   Treatment provided Cognitive-Linquistic     Cognitive-Linquistic Treatment   Treatment focused on Cognition   Skilled Treatment SLP again focused session on safety/safety awareness. Pt reports taking medications has been going "great" and "no problems," caregiver Enid Derry states pt missed one dose this week. Pt today denies problems with balance, strength; SLP provided a "hypothetical" situation of a  person with issues with these deficits; pt was unable to state safe/unsafe activities, instead perseverating on driving, using the riding lawn mower. SLP initiated discussion of pt's list of safe/unsafe activities; pt denies need to use walker or cane. SLP provided close supervision as pt ambulated to lobby, during which pt stumbled twice. As she sat down to await her caregiver, pt indicated she felt she ambulated safely despite SLP pointing out these 2 near-falls.     Assessment / Recommendations / Plan   Plan Continue with current plan of care     Progression Toward Goals   Progression toward goals Not  progressing toward goals (comment)          SLP Education - 04/07/17 1747    Education provided Yes   Education Details deficits, safety and fall risk   Person(s) Educated Patient;Caregiver(s)   Methods Explanation;Demonstration;Verbal cues   Comprehension Need further instruction          SLP Short Term Goals - 04/07/17 1432      SLP SHORT TERM GOAL #1   Title Pt will verbalize 2 deficits from her stroke and potential impacts on function/safety given min cues   Time 1   Period Weeks   Status Not Met     SLP SHORT TERM GOAL #2   Title Pt will complete thought organization, problem solving, and reasoning tasks with 80% accuracy given mod cues   Time 1   Period Weeks   Status Not Met     SLP SHORT TERM GOAL #3   Title Pt will complete simple attention to detail tasks with 80% accuracy given min assist   Time 1   Period Weeks   Status Not Met     SLP SHORT TERM GOAL #4   Title Pt will identify functional method of improving functional recall, and will bring it to therapy each session   Time 1   Period Weeks   Status Not Met          SLP Long Term Goals - 04/07/17 1432      SLP LONG TERM GOAL #1   Title Pt will verbalize compensatory strategies for areas of deficit with min cues   Time 5   Period Weeks   Status On-going     SLP LONG TERM GOAL #2   Title Pt will complete Level II/III thought organization, reasoning, problem solving tasks with 80% accuracy given min cues   Time 5   Period Weeks   Status Deferred     SLP LONG TERM GOAL #3   Title Pt will complete selective/alternating/divided attention tasks with 80% accuracy given min cues, in distracting environment   Time 5   Period Weeks   Status Deferred     SLP LONG TERM GOAL #4   Title Pt will demonstrate anticipatory awareness of level of assistance needed to complete desired activities with min A.   Time 5   Period Weeks   Status On-going          Plan - 04/07/17 1748    Clinical  Impression Statement Patient presents with baseline deficits in functional recall and short term memory. She has severely impaired insight into her new physical and cognitive deficits s/p CVA which impact her safety awareness and safety at home. Goals downgraded last session to target anticipatory awareness and safety. Continued to provide education today to pt and caregiver re: activities which are not safe for pt to do at home. PT requested to contact  pt's POA last session re: setting up weekend supervision, which was recommended upon d/c from CIR; pt became agitated and refused. Pt may benefit from a home safety evaluation as she is at high risk for falls due to reduced insight into her physical and cognitive deficits. Skilled ST recommended to maximize cognitive function for safety in the home.   Speech Therapy Frequency 2x / week   Treatment/Interventions Oral motor exercises   Potential to Achieve Goals Fair   Potential Considerations Family/community support;Previous level of function;Cooperation/participation level;Ability to learn/carryover information;Severity of impairments   Consulted and Agree with Plan of Care Patient;Family member/caregiver   Family Member Consulted friend/caregiver Enid Derry      Patient will benefit from skilled therapeutic intervention in order to improve the following deficits and impairments:   Cognitive communication deficit    Problem List Patient Active Problem List   Diagnosis Date Noted  . Cognitive deficit due to recent stroke 03/08/2017  . Alteration of sensation as late effect of stroke 03/08/2017  . Gait disturbance, post-stroke 03/08/2017  . Acute arterial ischemic stroke, vertebrobasilar, thalamic, left ([place]) 01/10/2017  . Neurologic gait disorder   . Depression   . Benign essential HTN   . History of breast cancer   . Stroke (cerebrum) ([place]) 01/08/2017  . TIA (transient ischemic attack) 12/16/2016  . Dysarthria 12/16/2016  . Leg weakness  12/16/2016  . EKG abnormalities 12/16/2016  . Chronic pain 12/16/2016  . Elevated blood pressure 12/22/2014  . Internal hemorrhoids with bleeding and prolapse 11/04/2014  . Lactose intolerance 11/04/2014  . Hemorrhoid 09/11/2014  . Rectal bleeding 09/11/2014  . IBS (irritable bowel syndrome) 09/25/2013  . Hyperparathyroidism, primary ([place]) 05/26/2011  . Osteoporosis 05/09/2009  . POLYCYTHEMIA 04/23/2009  . IMPAIRED FASTING GLUCOSE 11/14/2008  . BREAST CANCER, HX OF 12/06/2007  . Anxiety state 10/13/2007  . Essential hypertension 10/13/2007  . INTERSTITIAL CYSTITIS 09/06/2007  . HLD (hyperlipidemia) 03/01/2007  . Backache 03/01/2007   Deneise Lever, [place], [place] 04/07/2017, 5:57 PM  [facility] [address] [place], [place], [address] Phone: [phone]   Fax:  [phone]   Name: [name] MRN: [id number] Date of Birth: May 03, 1930

## 2017-04-07 NOTE — Therapy (Signed)
[facility] [address] [place], [place], [address] Phone: [phone]   Fax:  [phone]  Physical Therapy Treatment  Patient Details  Name: [name] MRN: [id number] Date of Birth: 08-Jul-1930 Referring Provider: Dr. Letta Pate  Encounter Date: 04/07/2017      PT End of Session - 04/07/17 1646    Visit Number 6   Number of Visits 9   Date for PT Re-Evaluation 04/09/17   Authorization Type Medicare and BCBS Federal: G-CODE AND PN EVERY 10TH VISIT.    PT Start Time 1316   PT Stop Time 1401   PT Time Calculation (min) 45 min   Equipment Utilized During Treatment Gait belt   Activity Tolerance Patient tolerated treatment well   Behavior During Therapy Impulsive      Past Medical History:  Diagnosis Date  . Adenomatous colon polyp 04/2005  . Allergic    Anectine  . ANXIETY 10/13/2007  . BACK PAIN 03/01/2007  . BREAST CANCER, HX OF 12/06/2007  . Cancer [facility])    RIGHT mastectomy with node dissection  . Complication of anesthesia    family aggergy to annectine  . Compression fracture of L4 lumbar vertebra (HCC) 02/2013  . CONSTIPATION 01/06/2009  . DEPRESSION 10/13/2007  . Diverticular disease   . DIVERTICULOSIS, COLON 05/11/2005  . External hemorrhoids without mention of complication 05/08/3845  . FATIGUE 08/07/2008  . HEMORRHOIDS, INTERNAL 05/11/2005  . Hypercalcemia 04/23/2009  . HYPERLIPIDEMIA NEC/NOS 03/01/2007  . HYPERTENSION 10/13/2007  . Impaired fasting glucose 11/14/2008  . INTERSTITIAL CYSTITIS 09/06/2007  . Irritable bowel syndrome 01/06/2009  . LIVER MASS 05/15/2009  . OSTEOPOROSIS 05/09/2009  . PARESTHESIA 09/06/2007  . POLYCYTHEMIA 04/23/2009  . STENOSIS, SPINAL, UNSPC REGION 03/01/2007  . Thyroid disease     Past Surgical History:  Procedure Laterality Date  . ABDOMINAL HYSTERECTOMY    . BACK SURGERY    . BREAST SURGERY     mastectomy -right  . CERVICAL SPINE SURGERY    . CHOLECYSTECTOMY     . COLONOSCOPY W/ BIOPSIES    . cortisone injection  10/16/12   left knee  . FIXATION KYPHOPLASTY LUMBAR SPINE    . HEMORRHOID BANDING  2015-16  . PARATHYROID EXPLORATION  06/29/2011   Procedure: PARATHYROID EXPLORATION;  Surgeon: Earnstine Regal, MD;  Location: [facility];  Service: General;  Laterality: Left;  Left Superior Parathyroidectomy  . PARATHYROIDECTOMY  06/29/11    There were no vitals filed for this visit.      Subjective Assessment - 04/07/17 1319    Subjective Pt denied falls or changes since last visit.    Patient is accompained by: --  caregiver: Enid Derry   Pertinent History HTN, interstitial cystitis, HLD, hyperparathyroidism, cognitive deficits from CVA, hx of R breast CA s/p mastectomy, R hand parasthesia after CVA, osteoporosis, depression, anxiety   Patient Stated Goals Be able to do the things I want to do.   Currently in Pain? Yes   Pain Score 5    Pain Location Back   Pain Orientation Lower   Pain Descriptors / Indicators Stabbing   Pain Type Chronic pain   Pain Onset More than a month ago   Pain Frequency Constant   Aggravating Factors  bending forward or twisting the wrong way   Pain Relieving Factors whirlpool and bed rest, pain medication   Multiple Pain Sites Yes   Pain Score --  7-8/10   Pain Location Foot   Pain Orientation Left;Right  Pain Descriptors / Indicators Pins and needles   Pain Type Neuropathic pain;Chronic pain   Pain Onset More than a month ago   Pain Frequency Constant   Aggravating Factors  walking/standing   Pain Relieving Factors Arnicare (homeopathic cream), lying down, wind blowing on feet                          [facility] Adult PT Treatment/Exercise - 04/07/17 1324      Ambulation/Gait   Ambulation/Gait Yes   Ambulation/Gait Assistance 4: Min assist;4: Min guard   Ambulation/Gait Assistance Details Cues for improved B heel strike, decr. narrow BOS and weight shifting while performing turns, and to decr.  momentum. Pt amb. with and without RW.   Ambulation Distance (Feet) 500 Feet  outdoors no AD and 75' indoors no AD, 230' indoors with RW   Assistive device None   Gait Pattern Step-to pattern;Decreased step length - right;Decreased step length - left;Decreased stride length;Decreased weight shift to right;Decreased weight shift to left;Shuffle;Lateral hip instability;Lateral trunk lean to right;Trunk flexed;Poor foot clearance - left;Poor foot clearance - right   Ambulation Surface Level;Unlevel;Indoor;Outdoor     Standardized Balance Assessment   Standardized Balance Assessment Dynamic Gait Index     Dynamic Gait Index   Level Surface Moderate Impairment   Change in Gait Speed Mild Impairment   Gait with Horizontal Head Turns Moderate Impairment   Gait with Vertical Head Turns Mild Impairment   Gait and Pivot Turn Mild Impairment   Step Over Obstacle Normal   Step Around Obstacles Mild Impairment   Steps Moderate Impairment   Total Score 14                PT Education - 04/07/17 1644    Education provided Yes   Education Details PT discussed the outcome measure results. PT discussed the importance of using a RW and performing HEP, if pt would like to continue PT. PT discussed that we will only renew PT, if pt agrees to perform HEP and amb. safely with RW.   Person(s) Educated Patient;Caregiver(s)   Methods Explanation   Comprehension Verbalized understanding;Need further instruction          PT Short Term Goals - 03/10/17 1200      PT SHORT TERM GOAL #1   Title same as LTGs           PT Long Term Goals - 04/07/17 1648      PT LONG TERM GOAL #1   Title Pt will perform HEP at MOD I level with caregiver's assist to improve balance, strength, dizziness, and safety. TARGET DATE FOR ALL LTGS: 04/07/17   Status New     PT LONG TERM GOAL #2   Title Pt will improve DGI score to >/=15/24 to decr. falls risk.    Baseline 14/24 on 04/07/17   Status Partially Met      PT LONG TERM GOAL #3   Title Pt will amb. 500' over even/uneven terrain with LRAD at MOD I level to improve functional mobility.    Status Not Met     PT LONG TERM GOAL #4   Title Perform vestibular assessment and write goals prn.   Baseline Goal not indicated; no further reports of dizziness   Status Deferred               Plan - 04/07/17 1646    Clinical Impression Statement Pt demonstrated progress, as DGI score improved but  still indicates pt is at risk for falls. Pt did not meet LTG 3 and partially met LTG2. PT will assess LTG 1 (HEP) tomorrow and will only renew pt for additional 2x/week for 2 weeks, if pt agreeable to following through with HEP and using AD for safety. Pt noted to amb. In safer manner with improved heel strike during gait with RW. Much of PT session focused on the importance of following PT advice, in order to maximize safety at home and in the community. Pt would continue to benefit from skilled PT to improve safety during functional mobility.    Rehab Potential Fair  based on cognition   Clinical Impairments Affecting Rehab Potential see above   PT Frequency 2x / week   PT Duration 4 weeks   PT Treatment/Interventions ADLs/Self Care Home Management;Biofeedback;Canalith Repostioning;Electrical Stimulation;Cognitive remediation;Neuromuscular re-education;Balance training;Therapeutic exercise;Therapeutic activities;Functional mobility training;Stair training;Gait training;DME Instruction;Orthotic Fit/Training;Patient/family education;Vestibular   PT Next Visit Plan Will need to re-assess LTG (HEP) and decide whether or not to recertify for more visits within next 1-2 visits (only for 2x/week for 2 weeks, if pt still agreeable) ; R NMR, dynamic balance activities incorporating R UE strengthening, ambulation using SPC-continues to shuffle with LOB to R at times.  Initiated PWR! exercises in sitting due to pt having parkinson-like gait.   Consulted and Agree with Plan  of Care Patient;Family member/caregiver   Family Member Consulted caregiver-Shirley      Patient will benefit from skilled therapeutic intervention in order to improve the following deficits and impairments:  Abnormal gait, Decreased endurance, Decreased knowledge of use of DME, Decreased balance, Decreased mobility, Dizziness, Decreased coordination, Decreased safety awareness, Impaired flexibility, Postural dysfunction, Decreased strength, Pain, Impaired sensation (PT will monitor pain but will not directly address)  Visit Diagnosis: Other abnormalities of gait and mobility  Hemiplegia and hemiparesis following cerebral infarction affecting right dominant side (HCC)  Unsteadiness on feet     Problem List Patient Active Problem List   Diagnosis Date Noted  . Cognitive deficit due to recent stroke 03/08/2017  . Alteration of sensation as late effect of stroke 03/08/2017  . Gait disturbance, post-stroke 03/08/2017  . Acute arterial ischemic stroke, vertebrobasilar, thalamic, left ([facility]) 01/10/2017  . Neurologic gait disorder   . Depression   . Benign essential HTN   . History of breast cancer   . Stroke (cerebrum) ([facility]) 01/08/2017  . TIA (transient ischemic attack) 12/16/2016  . Dysarthria 12/16/2016  . Leg weakness 12/16/2016  . EKG abnormalities 12/16/2016  . Chronic pain 12/16/2016  . Elevated blood pressure 12/22/2014  . Internal hemorrhoids with bleeding and prolapse 11/04/2014  . Lactose intolerance 11/04/2014  . Hemorrhoid 09/11/2014  . Rectal bleeding 09/11/2014  . IBS (irritable bowel syndrome) 09/25/2013  . Hyperparathyroidism, primary ([facility]) 05/26/2011  . Osteoporosis 05/09/2009  . POLYCYTHEMIA 04/23/2009  . IMPAIRED FASTING GLUCOSE 11/14/2008  . BREAST CANCER, HX OF 12/06/2007  . Anxiety state 10/13/2007  . Essential hypertension 10/13/2007  . INTERSTITIAL CYSTITIS 09/06/2007  . HLD (hyperlipidemia) 03/01/2007  . Backache 03/01/2007    Jayren Cease  L 04/07/2017, 4:49 PM  [facility] [address] [place], [place], [address] Phone: [phone]   Fax:  [phone]  Name: JENNAH SATCHELL MRN: [id number] Date of Birth: 1930/04/14  Geoffry Paradise, PT,DPT 04/07/17 4:50 PM Phone: [phone] Fax: [phone]

## 2017-04-08 ENCOUNTER — Encounter: Payer: Medicare Other | Attending: Physical Medicine & Rehabilitation | Admitting: Psychology

## 2017-04-08 ENCOUNTER — Ambulatory Visit: Payer: Medicare Other

## 2017-04-08 ENCOUNTER — Encounter: Payer: Self-pay | Admitting: Psychology

## 2017-04-08 ENCOUNTER — Ambulatory Visit: Payer: Medicare Other | Admitting: Physical Therapy

## 2017-04-08 DIAGNOSIS — I69398 Other sequelae of cerebral infarction: Secondary | ICD-10-CM | POA: Insufficient documentation

## 2017-04-08 DIAGNOSIS — E785 Hyperlipidemia, unspecified: Secondary | ICD-10-CM | POA: Diagnosis not present

## 2017-04-08 DIAGNOSIS — K589 Irritable bowel syndrome without diarrhea: Secondary | ICD-10-CM | POA: Diagnosis not present

## 2017-04-08 DIAGNOSIS — G8929 Other chronic pain: Secondary | ICD-10-CM | POA: Diagnosis not present

## 2017-04-08 DIAGNOSIS — F419 Anxiety disorder, unspecified: Secondary | ICD-10-CM | POA: Insufficient documentation

## 2017-04-08 DIAGNOSIS — I10 Essential (primary) hypertension: Secondary | ICD-10-CM | POA: Insufficient documentation

## 2017-04-08 DIAGNOSIS — Z7982 Long term (current) use of aspirin: Secondary | ICD-10-CM | POA: Insufficient documentation

## 2017-04-08 DIAGNOSIS — R269 Unspecified abnormalities of gait and mobility: Secondary | ICD-10-CM | POA: Diagnosis not present

## 2017-04-08 DIAGNOSIS — I69319 Unspecified symptoms and signs involving cognitive functions following cerebral infarction: Secondary | ICD-10-CM

## 2017-04-08 DIAGNOSIS — R42 Dizziness and giddiness: Secondary | ICD-10-CM | POA: Insufficient documentation

## 2017-04-08 DIAGNOSIS — R6889 Other general symptoms and signs: Secondary | ICD-10-CM | POA: Insufficient documentation

## 2017-04-08 DIAGNOSIS — Z87891 Personal history of nicotine dependence: Secondary | ICD-10-CM | POA: Diagnosis not present

## 2017-04-08 DIAGNOSIS — I69351 Hemiplegia and hemiparesis following cerebral infarction affecting right dominant side: Secondary | ICD-10-CM

## 2017-04-08 DIAGNOSIS — Z825 Family history of asthma and other chronic lower respiratory diseases: Secondary | ICD-10-CM | POA: Diagnosis not present

## 2017-04-08 DIAGNOSIS — M810 Age-related osteoporosis without current pathological fracture: Secondary | ICD-10-CM | POA: Diagnosis not present

## 2017-04-08 DIAGNOSIS — Z8673 Personal history of transient ischemic attack (TIA), and cerebral infarction without residual deficits: Secondary | ICD-10-CM | POA: Diagnosis not present

## 2017-04-08 DIAGNOSIS — R2689 Other abnormalities of gait and mobility: Secondary | ICD-10-CM

## 2017-04-08 DIAGNOSIS — F329 Major depressive disorder, single episode, unspecified: Secondary | ICD-10-CM | POA: Diagnosis not present

## 2017-04-08 DIAGNOSIS — D751 Secondary polycythemia: Secondary | ICD-10-CM | POA: Diagnosis not present

## 2017-04-08 DIAGNOSIS — R41841 Cognitive communication deficit: Secondary | ICD-10-CM | POA: Diagnosis not present

## 2017-04-08 DIAGNOSIS — Z853 Personal history of malignant neoplasm of breast: Secondary | ICD-10-CM | POA: Diagnosis not present

## 2017-04-08 DIAGNOSIS — Z8249 Family history of ischemic heart disease and other diseases of the circulatory system: Secondary | ICD-10-CM | POA: Diagnosis not present

## 2017-04-08 DIAGNOSIS — R2681 Unsteadiness on feet: Secondary | ICD-10-CM

## 2017-04-08 DIAGNOSIS — R209 Unspecified disturbances of skin sensation: Secondary | ICD-10-CM

## 2017-04-08 NOTE — Therapy (Signed)
[facility] [address] [place], [place], [address] Phone: [phone]   Fax:  [phone]  Physical Therapy Treatment  Patient Details  Name: [name] MRN: [id number] Date of Birth: [birth year] Referring Provider: Dr. Letta Pate  Encounter Date: 04/08/2017      PT End of Session - 04/08/17 1500    Visit Number 7   Number of Visits 9   Date for PT Re-Evaluation 04/09/17   Authorization Type Medicare and BCBS Federal: G-CODE AND PN EVERY 10TH VISIT.    PT Start Time 1317   PT Stop Time 1400   PT Time Calculation (min) 43 min   Activity Tolerance Patient tolerated treatment well;Patient limited by fatigue   Behavior During Therapy Impulsive      Past Medical History:  Diagnosis Date  . Adenomatous colon polyp 04/2005  . Allergic    Anectine  . ANXIETY 10/13/2007  . BACK PAIN 03/01/2007  . BREAST CANCER, HX OF 12/06/2007  . Cancer [facility])    RIGHT mastectomy with node dissection  . Complication of anesthesia    family aggergy to annectine  . Compression fracture of L4 lumbar vertebra (HCC) 02/2013  . CONSTIPATION 01/06/2009  . DEPRESSION 10/13/2007  . Diverticular disease   . DIVERTICULOSIS, COLON 05/11/2005  . External hemorrhoids without mention of complication 6/96/2952  . FATIGUE 08/07/2008  . HEMORRHOIDS, INTERNAL 05/11/2005  . Hypercalcemia 04/23/2009  . HYPERLIPIDEMIA NEC/NOS 03/01/2007  . HYPERTENSION 10/13/2007  . Impaired fasting glucose 11/14/2008  . INTERSTITIAL CYSTITIS 09/06/2007  . Irritable bowel syndrome 01/06/2009  . LIVER MASS 05/15/2009  . OSTEOPOROSIS 05/09/2009  . PARESTHESIA 09/06/2007  . POLYCYTHEMIA 04/23/2009  . STENOSIS, SPINAL, UNSPC REGION 03/01/2007  . Thyroid disease     Past Surgical History:  Procedure Laterality Date  . ABDOMINAL HYSTERECTOMY    . BACK SURGERY    . BREAST SURGERY     mastectomy -right  . CERVICAL SPINE SURGERY    . CHOLECYSTECTOMY    . COLONOSCOPY W/  BIOPSIES    . cortisone injection  10/16/12   left knee  . FIXATION KYPHOPLASTY LUMBAR SPINE    . HEMORRHOID BANDING  2015-16  . PARATHYROID EXPLORATION  06/29/2011   Procedure: PARATHYROID EXPLORATION;  Surgeon: Earnstine Regal, MD;  Location: [facility];  Service: General;  Laterality: Left;  Left Superior Parathyroidectomy  . PARATHYROIDECTOMY  06/29/11    There were no vitals filed for this visit.      Subjective Assessment - 04/08/17 1454    Subjective Patient denies problems or changes since last visit. Presents with rolling walker today for sizing. Says she usually uses the rollator and sometimes walks around inside the house with no device.   Patient is accompained by: --  Caregiver Shirley   Currently in Pain? Other (Comment)  Patient does not specifiy particular pain site or score, just that she hurts and her legs feel weak when she's done too much activity                         [facility] Adult PT Treatment/Exercise - 04/08/17 1400      Ambulation/Gait   Ambulation/Gait Yes   Ambulation/Gait Assistance 4: Min assist   Ambulation/Gait Assistance Details PT provided visual, verbal, manual cues to safely use rolling walker   Ambulation Distance (Feet) 230 Feet   Assistive device Rolling walker   Gait Pattern Step-to pattern;Decreased stride length;Trunk flexed;Decreased step length - right;Decreased step  length - left;Poor foot clearance - right;Poor foot clearance - left;Shuffle   Ambulation Surface Level;Indoor             Balance Exercises - 04/08/17 1400      OTAGO PROGRAM   Ankle Plantorflexors 20 reps, support  2 sets of 10; minA of PT   Ankle Dorsiflexors 20 reps, support  2 sets of 10; minA of PT   Tandem Walk Support  1 lap holding onto counter; modA of PT   Heel Walking Support  1 length holding counter; modA of PT   Toe Walk Support  1 length along counter holding RW; modA of PT   Overall OTAGO Comments Pt required visual, verbal, tactile  and demo cues to perform. PT provided min to modA throughout due to several instances of LOB/no falls           PT Education - 04/08/17 1458    Education provided Yes   Education Details Instruction in safe and appropriate use of standard walker. Discussed extending therapy 2x week/2 weeks to continue addressing LTGs.   Person(s) Educated Patient;Caregiver(s)   Methods Explanation;Demonstration;Tactile cues;Verbal cues   Comprehension Verbalized understanding;Verbal cues required;Tactile cues required;Need further instruction          PT Short Term Goals - 03/10/17 1200      PT SHORT TERM GOAL #1   Title same as LTGs           PT Long Term Goals - 04/08/17 1514      PT LONG TERM GOAL #1   Title Pt will perform HEP at MOD I level with caregiver's assist to improve balance, strength, dizziness, and safety. TARGET DATE FOR ALL LTGS: 04/07/17   Status Not Met     PT LONG TERM GOAL #2   Title Pt will improve DGI score to >/=15/24 to decr. falls risk.    Baseline 14/24 on 04/07/17   Status Partially Met     PT LONG TERM GOAL #3   Title Pt will amb. 500' over even/uneven terrain with LRAD at MOD I level to improve functional mobility.    Status Not Met     PT LONG TERM GOAL #4   Title Perform vestibular assessment and write goals prn.   Baseline Goal not indicated; no further reports of dizziness   Status Deferred               Plan - 04/08/17 1501    Clinical Impression Statement Pt did not meet LTG 1 (HEP) today as she was unable to perform activities provided in her HEP without min to modA of PT. Session focused on challenging balance during dynamic activities on level surfaces. Patient had several instances of LOB (no falls) and had difficulty with sequencing while using rolling walker. She also experienced leg weakness/shakiness. Patient continues to be at risk of falls and will benefit from skilled PT to improve safety during functional mobility. Patient  agreeable to renewing PT for 2x week/2 weeks to continue to address deficits and progress toward goals.   History and Personal Factors relevant to plan of care: Poor historian[place] and no assistance present on weekends.   Rehab Potential Fair   PT Frequency 2x / week   PT Duration 2 weeks   PT Treatment/Interventions ADLs/Self Care Home Management;Biofeedback;Canalith Repostioning;Electrical Stimulation;Cognitive remediation;Neuromuscular re-education;Balance training;Therapeutic exercise;Therapeutic activities;Functional mobility training;Stair training;Gait training;DME Instruction;Orthotic Fit/Training;Patient/family education;Vestibular   PT Next Visit Plan Dynamic balance  activities incorporating R UE strengthening, ambulation using rolling walker, PWR! exercises in sitting.   Consulted and Agree with Plan of Care Patient      Patient will benefit from skilled therapeutic intervention in order to improve the following deficits and impairments:  Abnormal gait, Decreased endurance, Decreased knowledge of use of DME, Decreased balance, Decreased mobility, Dizziness, Decreased coordination, Decreased safety awareness, Impaired flexibility, Postural dysfunction, Decreased strength, Pain, Impaired sensation  Visit Diagnosis: Other abnormalities of gait and mobility  Hemiplegia and hemiparesis following cerebral infarction affecting right dominant side (HCC)  Unsteadiness on feet  Dizziness and giddiness     Problem List Patient Active Problem List   Diagnosis Date Noted  . Cognitive deficit due to recent stroke 03/08/2017  . Alteration of sensation as late effect of stroke 03/08/2017  . Gait disturbance, post-stroke 03/08/2017  . Acute arterial ischemic stroke, vertebrobasilar, thalamic, left ([place] [facility]) 01/10/2017  . Neurologic gait disorder   . Depression   . Benign essential HTN   . History of breast cancer   . Stroke (cerebrum) ([facility])  01/08/2017  . TIA (transient ischemic attack) 12/16/2016  . Dysarthria 12/16/2016  . Leg weakness 12/16/2016  . EKG abnormalities 12/16/2016  . Chronic pain 12/16/2016  . Elevated blood pressure 12/22/2014  . Internal hemorrhoids with bleeding and prolapse 11/04/2014  . Lactose intolerance 11/04/2014  . Hemorrhoid 09/11/2014  . Rectal bleeding 09/11/2014  . IBS (irritable bowel syndrome) 09/25/2013  . Hyperparathyroidism, primary ([place]) 05/26/2011  . Osteoporosis 05/09/2009  . POLYCYTHEMIA 04/23/2009  . IMPAIRED FASTING GLUCOSE 11/14/2008  . BREAST CANCER, HX OF 12/06/2007  . Anxiety state 10/13/2007  . Essential hypertension 10/13/2007  . INTERSTITIAL CYSTITIS 09/06/2007  . HLD (hyperlipidemia) 03/01/2007  . Backache 03/01/2007    Gershon Crane, SPT 04/08/2017, 3:30 PM  [facility] [address] [place], [place], [address] Phone: [phone]   Fax:  [phone]  Name: TIEARA FLITTON MRN: [id number] Date of Birth: Sep 30, 1929

## 2017-04-08 NOTE — Therapy (Signed)
[facility] [address] [place], [place], [address] Phone: [phone]   Fax:  [phone]  Speech Language Pathology Treatment  Patient Details  Name: [name] MRN: [id number] Date of Birth: July 20, 1930 Referring Provider: Dr. Alysia Penna  Encounter Date: 04/08/2017      End of Session - 04/08/17 1459    Visit Number 6   Number of Visits 17   Date for SLP Re-Evaluation 05/13/17   SLP Start Time 6073   SLP Stop Time  1443   SLP Time Calculation (min) 38 min   Activity Tolerance Other (comment)  limited by significant lack of insight, and decr'd memory      Past Medical History:  Diagnosis Date  . Adenomatous colon polyp 04/2005  . Allergic    Anectine  . ANXIETY 10/13/2007  . BACK PAIN 03/01/2007  . BREAST CANCER, HX OF 12/06/2007  . Cancer [facility])    RIGHT mastectomy with node dissection  . Complication of anesthesia    family aggergy to annectine  . Compression fracture of L4 lumbar vertebra (HCC) 02/2013  . CONSTIPATION 01/06/2009  . DEPRESSION 10/13/2007  . Diverticular disease   . DIVERTICULOSIS, COLON 05/11/2005  . External hemorrhoids without mention of complication 03/01/6268  . FATIGUE 08/07/2008  . HEMORRHOIDS, INTERNAL 05/11/2005  . Hypercalcemia 04/23/2009  . HYPERLIPIDEMIA NEC/NOS 03/01/2007  . HYPERTENSION 10/13/2007  . Impaired fasting glucose 11/14/2008  . INTERSTITIAL CYSTITIS 09/06/2007  . Irritable bowel syndrome 01/06/2009  . LIVER MASS 05/15/2009  . OSTEOPOROSIS 05/09/2009  . PARESTHESIA 09/06/2007  . POLYCYTHEMIA 04/23/2009  . STENOSIS, SPINAL, UNSPC REGION 03/01/2007  . Thyroid disease     Past Surgical History:  Procedure Laterality Date  . ABDOMINAL HYSTERECTOMY    . BACK SURGERY    . BREAST SURGERY     mastectomy -right  . CERVICAL SPINE SURGERY    . CHOLECYSTECTOMY    . COLONOSCOPY W/ BIOPSIES    . cortisone injection  10/16/12   left knee  . FIXATION KYPHOPLASTY LUMBAR  SPINE    . HEMORRHOID BANDING  2015-16  . PARATHYROID EXPLORATION  06/29/2011   Procedure: PARATHYROID EXPLORATION;  Surgeon: Earnstine Regal, MD;  Location: [facility];  Service: General;  Laterality: Left;  Left Superior Parathyroidectomy  . PARATHYROIDECTOMY  06/29/11    There were no vitals filed for this visit.      Subjective Assessment - 04/08/17 1416    Subjective "I don't look at how a person looks, I look at their soul." (re: SLP remarking about pt not recalling what other SLP looked like yesterday)   Patient is accompained by: --  [place] SLP TREATMENT - 04/08/17 1418      General Information   Behavior/Cognition Alert;Confused     Treatment Provided   Treatment provided Cognitive-Linquistic     Cognitive-Linquistic Treatment   Treatment focused on Cognition   Skilled Treatment Pt explaining away deficits. "I don't remember things but I made it a practice to forget alot of what happens because it's trivial." SLP asked pt if she req'd therapy for her reduced memory skills and pt stated she did not. Pt overly verbose, SLP attempted to educate pt on CVA warning signs but difficult to reign in pt's over-verbosity, and pt perseveration on *her* signs and symptoms, even with max, direct redirection cues (i.e., "Let's stay on topic and talk about some general warning  signs instead of your own.", indicating reduced sustained/selective attention. Pt endorsed dizziness and numbness as her warning signs. Immediately afterwards pt then argued she did not have a TIA or a CVA but just "intense pain." as SLP attempted to bring objectivity into the session and review pt's recent health history as something that was not "trivial."      Assessment / Recommendations / Plan   Plan --  d/c if pt insight does not improve in next 1-2 sessions     Progression Toward Goals   Progression toward goals Not progressing toward goals (comment)          SLP Education - 04/08/17  1458    Education provided Yes   Education Details deficits, heath history, CVA warning signs   Person(s) Educated Patient   Methods Explanation   Comprehension Need further instruction          SLP Short Term Goals - 04/07/17 1432      SLP SHORT TERM GOAL #1   Title Pt will verbalize 2 deficits from her stroke and potential impacts on function/safety given min cues   Time 1   Period Weeks   Status Not Met     SLP SHORT TERM GOAL #2   Title Pt will complete thought organization, problem solving, and reasoning tasks with 80% accuracy given mod cues   Time 1   Period Weeks   Status Not Met     SLP SHORT TERM GOAL #3   Title Pt will complete simple attention to detail tasks with 80% accuracy given min assist   Time 1   Period Weeks   Status Not Met     SLP SHORT TERM GOAL #4   Title Pt will identify functional method of improving functional recall, and will bring it to therapy each session   Time 1   Period Weeks   Status Not Met          SLP Long Term Goals - 04/08/17 1517      SLP LONG TERM GOAL #1   Title Pt will verbalize compensatory strategies for areas of deficit with min cues   Time 5   Period Weeks   Status On-going     SLP LONG TERM GOAL #2   Title Pt will complete Level II/III thought organization, reasoning, problem solving tasks with 80% accuracy given min cues   Time 5   Period Weeks   Status Deferred     SLP LONG TERM GOAL #3   Title Pt will complete selective/alternating/divided attention tasks with 80% accuracy given min cues, in distracting environment   Time 5   Period Weeks   Status Deferred     SLP LONG TERM GOAL #4   Title Pt will demonstrate anticipatory awareness of level of assistance needed to complete desired activities with min A.   Time 5   Period Weeks   Status On-going          Plan - 04/08/17 1502    Clinical Impression Statement Pt should be d/c'd in next 1-2 visits if insight does not improve. Pt stated she did not  require ST for help with memory. She cont to presents with deficits in functional recall and short term memory. Severely impaired insight into her new physical and cognitive deficits s/p CVA were seen today, which impact her safety awareness and safety at home. Goals downgraded last session to target anticipatory awareness and safety. Pt would benefit from a home safety evaluation as  she is at high risk for falls due to reduced insight into her physical and cognitive deficits. Skilled ST recommended to maximize cognitive function for safety in the home.   Speech Therapy Frequency 2x / week   Treatment/Interventions Oral motor exercises   Potential to Achieve Goals Fair   Potential Considerations Family/community support;Previous level of function;Cooperation/participation level;Ability to learn/carryover information;Severity of impairments   Consulted and Agree with Plan of Care Patient;Family member/caregiver   Family Member Consulted friend/caregiver Enid Derry      Patient will benefit from skilled therapeutic intervention in order to improve the following deficits and impairments:   Cognitive communication deficit    Problem List Patient Active Problem List   Diagnosis Date Noted  . Cognitive deficit due to recent stroke 03/08/2017  . Alteration of sensation as late effect of stroke 03/08/2017  . Gait disturbance, post-stroke 03/08/2017  . Acute arterial ischemic stroke, vertebrobasilar, thalamic, left ([facility]) 01/10/2017  . Neurologic gait disorder   . Depression   . Benign essential HTN   . History of breast cancer   . Stroke (cerebrum) ([facility]) 01/08/2017  . TIA (transient ischemic attack) 12/16/2016  . Dysarthria 12/16/2016  . Leg weakness 12/16/2016  . EKG abnormalities 12/16/2016  . Chronic pain 12/16/2016  . Elevated blood pressure 12/22/2014  . Internal hemorrhoids with bleeding and prolapse 11/04/2014  . Lactose intolerance 11/04/2014  . Hemorrhoid 09/11/2014  . Rectal  bleeding 09/11/2014  . IBS (irritable bowel syndrome) 09/25/2013  . Hyperparathyroidism, primary ([place]) 05/26/2011  . Osteoporosis 05/09/2009  . POLYCYTHEMIA 04/23/2009  . IMPAIRED FASTING GLUCOSE 11/14/2008  . BREAST CANCER, HX OF 12/06/2007  . Anxiety state 10/13/2007  . Essential hypertension 10/13/2007  . INTERSTITIAL CYSTITIS 09/06/2007  . HLD (hyperlipidemia) 03/01/2007  . Backache 03/01/2007    [facility] ,[place], CCC-SLP  04/08/2017, 3:17 PM  [facility] [address] [place], [place], [address] Phone: [phone]   Fax:  [phone]   Name: SHERILYNN DIEU MRN: [id number] Date of Birth: 1930-08-03
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
## 2017-04-12 ENCOUNTER — Encounter: Payer: Self-pay | Admitting: Physical Medicine & Rehabilitation

## 2017-04-12 ENCOUNTER — Ambulatory Visit (HOSPITAL_BASED_OUTPATIENT_CLINIC_OR_DEPARTMENT_OTHER): Payer: Medicare Other | Admitting: Physical Medicine & Rehabilitation

## 2017-04-12 VITALS — BP 166/79 | HR 77 | Resp 14

## 2017-04-12 DIAGNOSIS — I69319 Unspecified symptoms and signs involving cognitive functions following cerebral infarction: Secondary | ICD-10-CM | POA: Diagnosis not present

## 2017-04-12 DIAGNOSIS — I69398 Other sequelae of cerebral infarction: Secondary | ICD-10-CM

## 2017-04-12 DIAGNOSIS — I639 Cerebral infarction, unspecified: Secondary | ICD-10-CM

## 2017-04-12 DIAGNOSIS — R269 Unspecified abnormalities of gait and mobility: Secondary | ICD-10-CM

## 2017-04-12 DIAGNOSIS — R209 Unspecified disturbances of skin sensation: Secondary | ICD-10-CM

## 2017-04-12 NOTE — Progress Notes (Signed)
Subjective:    Patient ID: [name], female    DOB: [birth year], 83 y.o.   MRN: [id number] Presented on Jan 07, 2017, with acute right-sided weakness and slurred speech.  CT showed acute left thalamus CVA.  MRI, acute ischemia within the left thalamus, no hemorrhage or mass effect. Echocardiogram with diastolic dysfunction, but otherwise normal MRA with moderate to severe stenosis, left MCA M2 branches.  The patient did not receive tPA, started on dual anti-platelet therapy, aspirin and Plavix. DATE OF ADMISSION:  01/10/2017 DATE OF DISCHARGE:  01/18/2017 HPI No falls at home but cannot get up after pulling weeds Have reviewed physical therapy notes, patient is displaying poor awareness of surroundings, which increases her fall risk. Patient states she is not aware of any cognitive problems after the stroke Speech therapy notes reviewed also, patient has deficits with functional recall short-term memory severely impaired insight into her deficits.  We discussed that strokes can cause the patient not to realize the deficits that they have. This is especially important in activities such as driving. The patient would like to go back to driving and is quite insistent that I let her go back to drive. We discussed reasons why she is not safe for driving which includes her poor awareness of surroundings, as well as for awareness of her deficits  Pain Inventory Average Pain 2 Pain Right Now 2 My pain is tingling and aching  In the last 24 hours, has pain interfered with the following? General activity 6 Relation with others 0 Enjoyment of life 3 What TIME of day is your pain at its worst? daytime Sleep (in general) Good  Pain is worse with: other Pain improves with: no selection Relief from Meds: no selection  Mobility walk with assistance use a walker how many minutes can you walk? varies ability to climb steps?  yes do you drive?  no transfers alone Do you have any  goals in this area?  yes  Function retired I need assistance with the following:  meal prep and household duties Do you have any goals in this area?  yes  Neuro/Psych bladder control problems bowel control problems tingling  Prior Studies Any changes since last visit?  no  Physicians involved in your care Any changes since last visit?  no   Family History  Problem Relation Age of Onset  . Heart disease Sister   . Hypertension Sister   . Asthma Sister   . Breast cancer Maternal Aunt   . Ovarian cancer Paternal Aunt   . Colon cancer Neg Hx   . Esophageal cancer Neg Hx   . Pancreatic cancer Neg Hx   . Rectal cancer Neg Hx   . Stomach cancer Neg Hx    Social History   Social History  . Marital status: Married    Spouse name: N/A  . Number of children: 0  . Years of education: N/A   Social History Main Topics  . Smoking status: Former Smoker    Packs/day: 1.00    Years: 42.00    Types: Cigarettes    Quit date: 08/23/1977  . Smokeless tobacco: Never Used  . Alcohol use No  . Drug use: No  . Sexual activity: Not Asked   Other Topics Concern  . None   Social History Narrative  . None   Past Surgical History:  Procedure Laterality Date  . ABDOMINAL HYSTERECTOMY    . BACK SURGERY    . BREAST SURGERY  mastectomy -right  . CERVICAL SPINE SURGERY    . CHOLECYSTECTOMY    . COLONOSCOPY W/ BIOPSIES    . cortisone injection  10/16/12   left knee  . FIXATION KYPHOPLASTY LUMBAR SPINE    . HEMORRHOID BANDING  2015-16  . PARATHYROID EXPLORATION  06/29/2011   Procedure: PARATHYROID EXPLORATION;  Surgeon: Earnstine Regal, MD;  Location: [facility];  Service: General;  Laterality: Left;  Left Superior Parathyroidectomy  . PARATHYROIDECTOMY  06/29/11   Past Medical History:  Diagnosis Date  . Adenomatous colon polyp 04/2005  . Allergic    Anectine  . ANXIETY 10/13/2007  . BACK PAIN 03/01/2007  . BREAST CANCER, HX OF 12/06/2007  . Cancer [facility])    RIGHT mastectomy with  node dissection  . Complication of anesthesia    family aggergy to annectine  . Compression fracture of L4 lumbar vertebra (HCC) 02/2013  . CONSTIPATION 01/06/2009  . DEPRESSION 10/13/2007  . Diverticular disease   . DIVERTICULOSIS, COLON 05/11/2005  . External hemorrhoids without mention of complication 0/53/9767  . FATIGUE 08/07/2008  . HEMORRHOIDS, INTERNAL 05/11/2005  . Hypercalcemia 04/23/2009  . HYPERLIPIDEMIA NEC/NOS 03/01/2007  . HYPERTENSION 10/13/2007  . Impaired fasting glucose 11/14/2008  . INTERSTITIAL CYSTITIS 09/06/2007  . Irritable bowel syndrome 01/06/2009  . LIVER MASS 05/15/2009  . OSTEOPOROSIS 05/09/2009  . PARESTHESIA 09/06/2007  . POLYCYTHEMIA 04/23/2009  . STENOSIS, SPINAL, UNSPC REGION 03/01/2007  . Thyroid disease    BP (!) 166/79 (BP Location: Left Arm, Patient Position: Sitting, Cuff Size: Normal)   Pulse 77   Resp 14   SpO2 94%   Opioid Risk Score:   Fall Risk Score:  `1  Depression screen PHQ 2/9  Depression screen [facility] 2/9 06/23/2012 02/02/2012 02/02/2012  Decreased Interest 1 1 1   Down, Depressed, Hopeless 1 1 1   PHQ - 2 Score 2 2 2   Some recent data might be hidden    Review of Systems  Constitutional: Negative.   HENT: Negative.   Eyes: Negative.   Respiratory: Negative.   Cardiovascular: Negative.   Gastrointestinal: Positive for constipation and diarrhea.  Endocrine: Negative.   Genitourinary: Positive for difficulty urinating.  Musculoskeletal: Positive for arthralgias.  Skin: Negative.   Allergic/Immunologic: Negative.   Neurological: Negative.   Hematological: Negative.   Psychiatric/Behavioral: Negative.        Objective:   Physical Exam  Constitutional: She is oriented to person, place, and time. She appears well-developed and well-nourished.  HENT:  Head: Normocephalic and atraumatic.  Eyes: Pupils are equal, round, and reactive to light. Conjunctivae and EOM are normal.  Neurological: She is alert and oriented to person, place, and  time. Coordination and gait abnormal.  Psychiatric: She has a normal mood and affect. She is slowed. Cognition and memory are impaired. She expresses inappropriate judgment.  Patient is repetitive, she is distractible and gets off subject constantly She is inattentive.  Nursing note and vitals reviewed. Positive Romberg. Gait is using a rolling walker. No evidence of total drug or knee instability. Patient can identify light touch, in right upper and right lower limb. She has decreased fine motor skills right upper extremity. Right lower extremity has reduced speed of toe tapping compared to left side        Assessment & Plan:  1. Left thalamic stroke with gait disorder, right hemisensory deficits We'll finish out home health therapy in the next month. Patient thought there was only a couple visits left. We went over the remaining visits with  her. No physical medicine and rehabilitation follow-up needed. Given that she is plateauing 2. Chronic pain  The patient blames her chronic pain for her current deficits, we discussed that her deficits are related to her stroke and that she went through inpatient rehabilitation, as well as outpatient rehabilitation for the stroke and not for chronic pain  Reiterated. No driving, patient will follow-up with neurology in November, she may discuss her driving at that time with Dr. Tomi Likens. In the meantime, she will undergo neuropsychologic testing  Over half of the 25 min visit was spent counseling and coordinating care, Please see above

## 2017-04-12 NOTE — Patient Instructions (Signed)
No Driving until cleared by MD, ask Dr Tomi Likens when you follow up since you do not need additional rehab followup

## 2017-04-14 ENCOUNTER — Encounter: Payer: Medicare Other | Admitting: Speech Pathology

## 2017-04-15 ENCOUNTER — Encounter: Payer: Medicare Other | Admitting: *Deleted

## 2017-04-15 ENCOUNTER — Ambulatory Visit: Payer: Medicare Other | Admitting: Physical Therapy

## 2017-04-18 ENCOUNTER — Encounter: Payer: Medicare Other | Admitting: Speech Pathology

## 2017-04-19 ENCOUNTER — Ambulatory Visit: Payer: Medicare Other | Admitting: Physical Medicine & Rehabilitation

## 2017-04-21 ENCOUNTER — Ambulatory Visit: Payer: Medicare Other | Admitting: Speech Pathology

## 2017-04-21 DIAGNOSIS — R41841 Cognitive communication deficit: Secondary | ICD-10-CM

## 2017-04-21 NOTE — Therapy (Signed)
[facility] [address] [place], [place], [address] Phone: [phone]   Fax:  [phone]  Speech Language Pathology Treatment and Discharge Summary  Patient Details  Name: [name] MRN: [id number] Date of Birth: [birth year] Referring Provider: Dr. Alysia Penna  Encounter Date: 04/21/2017      End of Session - 04/21/17 1034    Visit Number 7   Number of Visits 17   Date for SLP Re-Evaluation 05/13/17   SLP Start Time 1017   SLP Stop Time  1053   SLP Time Calculation (min) 36 min   Activity Tolerance Treatment limited secondary to agitation      Past Medical History:  Diagnosis Date  . Adenomatous colon polyp 04/2005  . Allergic    Anectine  . ANXIETY 10/13/2007  . BACK PAIN 03/01/2007  . BREAST CANCER, HX OF 12/06/2007  . Cancer [facility])    RIGHT mastectomy with node dissection  . Complication of anesthesia    family aggergy to annectine  . Compression fracture of L4 lumbar vertebra (HCC) 02/2013  . CONSTIPATION 01/06/2009  . DEPRESSION 10/13/2007  . Diverticular disease   . DIVERTICULOSIS, COLON 05/11/2005  . External hemorrhoids without mention of complication 2/42/3536  . FATIGUE 08/07/2008  . HEMORRHOIDS, INTERNAL 05/11/2005  . Hypercalcemia 04/23/2009  . HYPERLIPIDEMIA NEC/NOS 03/01/2007  . HYPERTENSION 10/13/2007  . Impaired fasting glucose 11/14/2008  . INTERSTITIAL CYSTITIS 09/06/2007  . Irritable bowel syndrome 01/06/2009  . LIVER MASS 05/15/2009  . OSTEOPOROSIS 05/09/2009  . PARESTHESIA 09/06/2007  . POLYCYTHEMIA 04/23/2009  . STENOSIS, SPINAL, UNSPC REGION 03/01/2007  . Thyroid disease     Past Surgical History:  Procedure Laterality Date  . ABDOMINAL HYSTERECTOMY    . BACK SURGERY    . BREAST SURGERY     mastectomy -right  . CERVICAL SPINE SURGERY    . CHOLECYSTECTOMY    . COLONOSCOPY W/ BIOPSIES    . cortisone injection  10/16/12   left knee  . FIXATION KYPHOPLASTY LUMBAR SPINE    .  HEMORRHOID BANDING  2015-16  . PARATHYROID EXPLORATION  06/29/2011   Procedure: PARATHYROID EXPLORATION;  Surgeon: Earnstine Regal, MD;  Location: [facility];  Service: General;  Laterality: Left;  Left Superior Parathyroidectomy  . PARATHYROIDECTOMY  06/29/11    There were no vitals filed for this visit.      Subjective Assessment - 04/21/17 1033    Subjective "The only part of my body that the stroke affected is my hand."   Currently in Pain? No/denies               ADULT SLP TREATMENT - 04/21/17 1017      General Information   Behavior/Cognition Alert;Confused   Patient Positioning Upright in chair   Oral care provided N/A     Treatment Provided   Treatment provided Cognitive-Linquistic     Cognitive-Linquistic Treatment   Treatment focused on Cognition   Skilled Treatment Pt returns, having canceled last 3 appointments due to "dizziness." Arrived today perseverating on physical therapy goals, despite continued SLP redirection to purposes of ST independent of PT. She continued to deny cognitive deficits, states, "the only reason I'm coming here is so I can get back to driving." Informed pt that ST would be unable to give pt approval for driving; pt states, "I've driven in big cities, so I think I can drive in [place]." Discussed goals of care with pt who continues to deny deficits, need for therapy. SLP  educated pt that if she does not see need for therapy or does not wish to participate in home exercise program, she is unlikely to benefit from [place]. Pt in agreement for d/c at this time and for canceling remaining appointments. SLP requested to speak with pt's caregiver Enid Derry, who remained in the lobby today. Pt did not agree to SLP discussing plans with caregiver, stating, "I can tell her myself."      Assessment / Recommendations / Plan   Plan Discharge SLP treatment due to (comment)  not progressing toward goals, pt requests d/c     Progression Toward Goals   Progression  toward goals Not progressing toward goals (comment)  discharged today          SLP Education - 04/21/17 1357    Education provided Yes   Education Details higher level cognitive skills needed for driving safely   Person(s) Educated Patient   Methods Explanation   Comprehension Other (comment)  nonacceptance          SLP Short Term Goals - 04/21/17 1406      SLP SHORT TERM GOAL #1   Title Pt will verbalize 2 deficits from her stroke and potential impacts on function/safety given min cues   Status Not Met     SLP SHORT TERM GOAL #2   Title Pt will complete thought organization, problem solving, and reasoning tasks with 80% accuracy given mod cues   Status Not Met     SLP SHORT TERM GOAL #3   Title Pt will complete simple attention to detail tasks with 80% accuracy given min assist   Status Not Met     SLP SHORT TERM GOAL #4   Title Pt will identify functional method of improving functional recall, and will bring it to therapy each session   Status Not Met          SLP Long Term Goals - 04/21/17 1406      SLP LONG TERM GOAL #1   Title Pt will verbalize compensatory strategies for areas of deficit with min cues   Time 4   Status Not Met     SLP LONG TERM GOAL #2   Title Pt will complete Level II/III thought organization, reasoning, problem solving tasks with 80% accuracy given min cues   Time 5   Period Weeks   Status Not Met     SLP LONG TERM GOAL #3   Title Pt will complete selective/alternating/divided attention tasks with 80% accuracy given min cues, in distracting environment   Time 5   Status Not Met     SLP LONG TERM GOAL #4   Title Pt will demonstrate anticipatory awareness of level of assistance needed to complete desired activities with min A.   Time 5   Status Not Met          Plan - 04/21/17 1358    Clinical Impression Statement Pt's insight remains poor; she continues to display severely impaired insight into her physical and cognitive  deficits s/p CVA, which impact her safety awareness and safety at home. Pt has not been progressing toward goals for insight/safety awareness despite downgrade of goals. Pt states she does not think she will benefit from [place]; SLP educated pt that if she does not see benefit in therapy, she is unlikely to make progress. Pt in agreement with d/c from ST at this time. Pt referred to Dr. Sima Matas by Dr. Letta Pate for neuropsychiatry evaluation. Pt may benefit from a home safety  evaluation as she is at high risk for falls due to reduced insight into her physical and cognitive deficits.   Speech Therapy Frequency --  d/c   Duration --  d/c   Potential to Achieve Goals Poor   Potential Considerations Family/community support;Previous level of function;Cooperation/participation level;Ability to learn/carryover information;Severity of impairments;Financial resources   Consulted and Agree with Plan of Care Patient      Patient will benefit from skilled therapeutic intervention in order to improve the following deficits and impairments:   Cognitive communication deficit      G-Codes - 05/03/17 1407    Functional Assessment Tool Used asha noms, clinical judgment   Functional Limitations Attention   Attention Current Status (Z4827) At least 40 percent but less than 60 percent impaired, limited or restricted   Attention Goal Status (M7867) At least 20 percent but less than 40 percent impaired, limited or restricted   Attention Discharge Status (J4492) At least 40 percent but less than 60 percent impaired, limited or restricted      Problem List Patient Active Problem List   Diagnosis Date Noted  . Cognitive deficit due to recent stroke 03/08/2017  . Alteration of sensation as late effect of stroke 03/08/2017  . Gait disturbance, post-stroke 03/08/2017  . Acute arterial ischemic stroke, vertebrobasilar, thalamic, left ([place]) 01/10/2017  . Neurologic gait disorder   . Depression   . Benign  essential HTN   . History of breast cancer   . Stroke (cerebrum) ([place]) 01/08/2017  . TIA (transient ischemic attack) 12/16/2016  . Dysarthria 12/16/2016  . Leg weakness 12/16/2016  . EKG abnormalities 12/16/2016  . Chronic pain 12/16/2016  . Elevated blood pressure 12/22/2014  . Internal hemorrhoids with bleeding and prolapse 11/04/2014  . Lactose intolerance 11/04/2014  . Hemorrhoid 09/11/2014  . Rectal bleeding 09/11/2014  . IBS (irritable bowel syndrome) 09/25/2013  . Hyperparathyroidism, primary ([place]) 05/26/2011  . Osteoporosis 05/09/2009  . POLYCYTHEMIA 04/23/2009  . IMPAIRED FASTING GLUCOSE 11/14/2008  . BREAST CANCER, HX OF 12/06/2007  . Anxiety state 10/13/2007  . Essential hypertension 10/13/2007  . INTERSTITIAL CYSTITIS 09/06/2007  . HLD (hyperlipidemia) 03/01/2007  . Backache 03/01/2007   SPEECH THERAPY DISCHARGE SUMMARY  Visits from Start of Care: 7  Current functional level related to goals / functional outcomes: See clinical impressions   Remaining deficits: See clinical impressions   Education / Equipment: None recommended  Plan: Patient agrees to discharge.  Patient goals were not met. Patient is being discharged due to lack of progress.  ?????         Deneise Lever, [place], CCC-SLP Speech-Language Pathologist  Aliene Altes 2017/05/03, 2:08 PM  [facility] [address] [place], [place], [address] Phone: [phone]   Fax:  [phone]   Name: [name] MRN: [id number] Date of Birth: September 14, 1929

## 2017-04-22 ENCOUNTER — Ambulatory Visit: Payer: Medicare Other

## 2017-04-22 DIAGNOSIS — R2689 Other abnormalities of gait and mobility: Secondary | ICD-10-CM

## 2017-04-22 DIAGNOSIS — I69351 Hemiplegia and hemiparesis following cerebral infarction affecting right dominant side: Secondary | ICD-10-CM

## 2017-04-22 DIAGNOSIS — R2681 Unsteadiness on feet: Secondary | ICD-10-CM

## 2017-04-22 DIAGNOSIS — R41841 Cognitive communication deficit: Secondary | ICD-10-CM | POA: Diagnosis not present

## 2017-04-22 NOTE — Therapy (Signed)
[facility] [address] [place], [place], [address] Phone: [phone]   Fax:  [phone]  Physical Therapy Treatment  Patient Details  Name: [name] MRN: [id number] Date of Birth: [birth year] Referring Provider: Dr. Letta Pate  Encounter Date: 04/22/2017      PT End of Session - 04/22/17 1142    Visit Number 8   Number of Visits 13   Date for PT Re-Evaluation 04/22/17   Authorization Type Medicare and BCBS Federal: G-CODE AND PN EVERY 10TH VISIT.    PT Start Time 1102  pt in bathroom getting hair out of eye with saline solution and caregive assist   PT Stop Time 1130   PT Time Calculation (min) 28 min   Equipment Utilized During Treatment Gait belt   Activity Tolerance Patient tolerated treatment well   Behavior During Therapy Impulsive      Past Medical History:  Diagnosis Date  . Adenomatous colon polyp 04/2005  . Allergic    Anectine  . ANXIETY 10/13/2007  . BACK PAIN 03/01/2007  . BREAST CANCER, HX OF 12/06/2007  . Cancer [facility])    RIGHT mastectomy with node dissection  . Complication of anesthesia    family aggergy to annectine  . Compression fracture of L4 lumbar vertebra (HCC) 02/2013  . CONSTIPATION 01/06/2009  . DEPRESSION 10/13/2007  . Diverticular disease   . DIVERTICULOSIS, COLON 05/11/2005  . External hemorrhoids without mention of complication 09/08/3565  . FATIGUE 08/07/2008  . HEMORRHOIDS, INTERNAL 05/11/2005  . Hypercalcemia 04/23/2009  . HYPERLIPIDEMIA NEC/NOS 03/01/2007  . HYPERTENSION 10/13/2007  . Impaired fasting glucose 11/14/2008  . INTERSTITIAL CYSTITIS 09/06/2007  . Irritable bowel syndrome 01/06/2009  . LIVER MASS 05/15/2009  . OSTEOPOROSIS 05/09/2009  . PARESTHESIA 09/06/2007  . POLYCYTHEMIA 04/23/2009  . STENOSIS, SPINAL, UNSPC REGION 03/01/2007  . Thyroid disease     Past Surgical History:  Procedure Laterality Date  . ABDOMINAL HYSTERECTOMY    . BACK SURGERY    . BREAST  SURGERY     mastectomy -right  . CERVICAL SPINE SURGERY    . CHOLECYSTECTOMY    . COLONOSCOPY W/ BIOPSIES    . cortisone injection  10/16/12   left knee  . FIXATION KYPHOPLASTY LUMBAR SPINE    . HEMORRHOID BANDING  2015-16  . PARATHYROID EXPLORATION  06/29/2011   Procedure: PARATHYROID EXPLORATION;  Surgeon: Earnstine Regal, MD;  Location: [facility];  Service: General;  Laterality: Left;  Left Superior Parathyroidectomy  . PARATHYROIDECTOMY  06/29/11    There were no vitals filed for this visit.      Subjective Assessment - 04/22/17 1110    Subjective Pt reported she hasn't been to PT due to feeling bad/dizzy. Pt reported she has something in right eye and would like something to get it out (she believes it is a hair).    Patient is accompained by: --  caregiver   Pertinent History HTN, interstitial cystitis, HLD, hyperparathyroidism, cognitive deficits from CVA, hx of R breast CA s/p mastectomy, R hand parasthesia after CVA, osteoporosis, depression, anxiety   Patient Stated Goals Be able to do the things I want to do.   Currently in Pain? No/denies                         [facility] Adult PT Treatment/Exercise - 04/22/17 1114      Ambulation/Gait   Ambulation/Gait Yes   Ambulation/Gait Assistance 4: Min assist;4: Min guard  Ambulation/Gait Assistance Details Cues and demo to improve heel strike and sequencing with SBQC. PT had pt attempt amb. without AD and pt required min A to maintain balance.    Ambulation Distance (Feet) 500 Feet  75   Assistive device 1 person hand held assist;Small based quad cane   Gait Pattern Step-to pattern;Decreased stride length;Trunk flexed;Decreased step length - right;Decreased step length - left;Poor foot clearance - right;Poor foot clearance - left;Shuffle   Ambulation Surface Level;Indoor     Standardized Balance Assessment   Standardized Balance Assessment Dynamic Gait Index     Dynamic Gait Index   Level Surface Moderate  Impairment   Change in Gait Speed Mild Impairment   Gait with Horizontal Head Turns Moderate Impairment   Gait with Vertical Head Turns Mild Impairment   Gait and Pivot Turn Mild Impairment   Step Over Obstacle Normal   Step Around Obstacles Mild Impairment   Steps Moderate Impairment   Total Score 14                PT Education - 04/22/17 1141    Education provided Yes   Education Details PT discussed goal progress, reiterated the importance of using walker at all times for safety, and d/c. PT educated pt on HEP frequency after d/c from PT.    Person(s) Educated Patient;Caregiver(s)   Methods Explanation   Comprehension Verbalized understanding          PT Short Term Goals - 03/10/17 1200      PT SHORT TERM GOAL #1   Title same as LTGs           PT Long Term Goals - 04/22/17 1144      PT LONG TERM GOAL #1   Title Pt will perform HEP at supervision level with caregiver's assist to improve balance, strength, dizziness, and safety. TARGET DATE FOR ALL LTGS: 04/22/17   Baseline min-mod A   Time 2   Period Weeks   Status Not Met     PT LONG TERM GOAL #2   Title Pt will improve DGI score to >/=15/24 to decr. falls risk.    Baseline 14/24 on 04/07/17   Time 2   Period Weeks   Status Not Met     PT LONG TERM GOAL #3   Title Pt will amb. 300' over even/uneven terrain with LRAD and supervision level to improve functional mobility.    Time 2   Period Weeks   Status Not Met     PT LONG TERM GOAL #4   Status Deferred               Plan - 04/22/17 1142    Clinical Impression Statement Pt missed several weeks of PT 2/2 c/o dizziness but did not wish to have PT assess dizziness, as pt reported her GYN doctor told her to see another doctor as dizziness is coming from her vaginal area. PT assess goals and pt did not meet any goals. PT is discharing pt at this time, due to lack of progress 2/2 cognitive impairments. Please see d/c summary for details.    Rehab  Potential Fair   PT Frequency 2x / week   PT Duration 2 weeks   PT Treatment/Interventions ADLs/Self Care Home Management;Biofeedback;Canalith Repostioning;Electrical Stimulation;Cognitive remediation;Neuromuscular re-education;Balance training;Therapeutic exercise;Therapeutic activities;Functional mobility training;Stair training;Gait training;DME Instruction;Orthotic Fit/Training;Patient/family education;Vestibular   PT Next Visit Plan d/c   Consulted and Agree with Plan of Care Patient      Patient will benefit  from skilled therapeutic intervention in order to improve the following deficits and impairments:  Abnormal gait, Decreased endurance, Decreased knowledge of use of DME, Decreased balance, Decreased mobility, Dizziness, Decreased coordination, Decreased safety awareness, Impaired flexibility, Postural dysfunction, Decreased strength, Pain, Impaired sensation  Visit Diagnosis: Other abnormalities of gait and mobility  Hemiplegia and hemiparesis following cerebral infarction affecting right dominant side (HCC)  Unsteadiness on feet       G-Codes - 2017/05/17 1145    Functional Assessment Tool Used (Outpatient Only) DGI: 14/24   Functional Limitation Mobility: Walking and moving around   Mobility: Walking and Moving Around Goal Status [phone]) At least 1 percent but less than 20 percent impaired, limited or restricted   Mobility: Walking and Moving Around Discharge Status [id number]) At least 40 percent but less than 60 percent impaired, limited or restricted      Problem List Patient Active Problem List   Diagnosis Date Noted  . Cognitive deficit due to recent stroke 03/08/2017  . Alteration of sensation as late effect of stroke 03/08/2017  . Gait disturbance, post-stroke 03/08/2017  . Acute arterial ischemic stroke, vertebrobasilar, thalamic, left ([place]) 01/10/2017  . Neurologic gait disorder   . Depression   . Benign essential HTN   . History of breast cancer   . Stroke  (cerebrum) ([place]) 01/08/2017  . TIA (transient ischemic attack) 12/16/2016  . Dysarthria 12/16/2016  . Leg weakness 12/16/2016  . EKG abnormalities 12/16/2016  . Chronic pain 12/16/2016  . Elevated blood pressure 12/22/2014  . Internal hemorrhoids with bleeding and prolapse 11/04/2014  . Lactose intolerance 11/04/2014  . Hemorrhoid 09/11/2014  . Rectal bleeding 09/11/2014  . IBS (irritable bowel syndrome) 09/25/2013  . Hyperparathyroidism, primary ([place]) 05/26/2011  . Osteoporosis 05/09/2009  . POLYCYTHEMIA 04/23/2009  . IMPAIRED FASTING GLUCOSE 11/14/2008  . BREAST CANCER, HX OF 12/06/2007  . Anxiety state 10/13/2007  . Essential hypertension 10/13/2007  . INTERSTITIAL CYSTITIS 09/06/2007  . HLD (hyperlipidemia) 03/01/2007  . Backache 03/01/2007    Kimorah Ridolfi L 05/17/17, 11:45 AM  [facility] [address] [place], [place], [address] Phone: [phone]   Fax:  [phone]  Name: [name] MRN: [id number] Date of Birth: 07-18-1930  PHYSICAL THERAPY DISCHARGE SUMMARY  Visits from Start of Care: 8  Current functional level related to goals / functional outcomes:     PT Long Term Goals - 05-17-2017 1144      PT LONG TERM GOAL #1   Title Pt will perform HEP at supervision level with caregiver's assist to improve balance, strength, dizziness, and safety. TARGET DATE FOR ALL LTGS: 17-May-2017   Baseline min-mod A   Time 2   Period Weeks   Status Not Met     PT LONG TERM GOAL #2   Title Pt will improve DGI score to >/=15/24 to decr. falls risk.    Baseline 14/24 on 04/07/17   Time 2   Period Weeks   Status Not Met     PT LONG TERM GOAL #3   Title Pt will amb. 300' over even/uneven terrain with LRAD and supervision level to improve functional mobility.    Time 2   Period Weeks   Status Not Met     PT LONG TERM GOAL #4   Status Deferred        Remaining deficits: Impaired balance,  strength, endurance, and safety awareness 2/2 cognitive deficits. Pt not performing HEP at home.   Education / Equipment: HEP  Plan:  Patient agrees to discharge.  Patient goals were not met. Patient is being discharged due to lack of progress.  ?????          Geoffry Paradise, PT,DPT 04/22/17 11:47 AM Phone: [phone] Fax: [phone]

## 2017-04-26 ENCOUNTER — Encounter: Payer: Medicare Other | Admitting: *Deleted

## 2017-04-28 ENCOUNTER — Encounter: Payer: Medicare Other | Admitting: Speech Pathology

## 2017-05-02 ENCOUNTER — Encounter: Payer: Medicare Other | Admitting: Speech Pathology

## 2017-05-05 ENCOUNTER — Encounter: Payer: Medicare Other | Admitting: Speech Pathology

## 2017-05-09 ENCOUNTER — Encounter: Payer: Medicare Other | Admitting: Speech Pathology

## 2017-05-10 ENCOUNTER — Encounter: Payer: Medicare Other | Admitting: *Deleted

## 2017-05-20 ENCOUNTER — Encounter (HOSPITAL_COMMUNITY): Payer: Self-pay

## 2017-05-20 ENCOUNTER — Encounter: Payer: Medicare Other | Admitting: Psychology

## 2017-05-20 ENCOUNTER — Emergency Department (HOSPITAL_COMMUNITY)
Admission: EM | Admit: 2017-05-20 | Discharge: 2017-05-20 | Disposition: A | Discharge status: Home / Self Care | Payer: Medicare Other | Attending: Emergency Medicine | Admitting: Emergency Medicine

## 2017-05-20 ENCOUNTER — Emergency Department (HOSPITAL_COMMUNITY): Payer: Medicare Other

## 2017-05-20 DIAGNOSIS — Z5321 Procedure and treatment not carried out due to patient leaving prior to being seen by health care provider: Secondary | ICD-10-CM | POA: Diagnosis not present

## 2017-05-20 DIAGNOSIS — R55 Syncope and collapse: Secondary | ICD-10-CM | POA: Diagnosis not present

## 2017-05-20 LAB — CBG MONITORING, ED: Glucose-Capillary: 143 mg/dL — ABNORMAL HIGH (ref 65–99)

## 2017-05-20 NOTE — ED Triage Notes (Signed)
Patient states she was walking with her walker and states her left knee may have given out on her. Patient denies LOC. Patient denies disorientation, or  diaphoresis

## 2017-05-20 NOTE — ED Triage Notes (Signed)
Pt with [facility] from home. States she was walking through the bedroom and woke up on the floor. Pt was awake when friend came over to help her off the floor. Denies neck or back pain. No orthostatic changes. 12 Lead SR with RBB. She is supposed to take plavix but has not in the last month. A/O at triage. Vitals stable  164/66 67P 16 Resp 94% RA 149 CBG

## 2017-05-24 ENCOUNTER — Encounter: Payer: Medicare Other | Admitting: Psychology

## 2017-05-25 ENCOUNTER — Emergency Department (HOSPITAL_COMMUNITY): Payer: Medicare Other

## 2017-05-25 ENCOUNTER — Emergency Department (HOSPITAL_COMMUNITY)
Admission: EM | Admit: 2017-05-25 | Discharge: 2017-05-25 | Disposition: A | Discharge status: Home / Self Care | Payer: Medicare Other | Attending: Emergency Medicine | Admitting: Emergency Medicine

## 2017-05-25 ENCOUNTER — Encounter (HOSPITAL_COMMUNITY): Payer: Self-pay | Admitting: *Deleted

## 2017-05-25 DIAGNOSIS — S2231XA Fracture of one rib, right side, initial encounter for closed fracture: Secondary | ICD-10-CM | POA: Diagnosis present

## 2017-05-25 DIAGNOSIS — Z79899 Other long term (current) drug therapy: Secondary | ICD-10-CM | POA: Diagnosis not present

## 2017-05-25 DIAGNOSIS — Z87891 Personal history of nicotine dependence: Secondary | ICD-10-CM | POA: Diagnosis not present

## 2017-05-25 DIAGNOSIS — Y939 Activity, unspecified: Secondary | ICD-10-CM | POA: Diagnosis not present

## 2017-05-25 DIAGNOSIS — Y999 Unspecified external cause status: Secondary | ICD-10-CM | POA: Insufficient documentation

## 2017-05-25 DIAGNOSIS — Y929 Unspecified place or not applicable: Secondary | ICD-10-CM | POA: Diagnosis not present

## 2017-05-25 DIAGNOSIS — W0110XA Fall on same level from slipping, tripping and stumbling with subsequent striking against unspecified object, initial encounter: Secondary | ICD-10-CM | POA: Diagnosis not present

## 2017-05-25 MED ORDER — OXYCODONE-ACETAMINOPHEN 5-325 MG PO TABS
1.0000 | ORAL_TABLET | Freq: Once | ORAL | Status: AC
  Administered 2017-05-25: 1 via ORAL
  Filled 2017-05-25: qty 1

## 2017-05-25 MED ORDER — OXYCODONE-ACETAMINOPHEN 5-325 MG PO TABS: 1.0000 | ORAL_TABLET | Freq: Three times a day (TID) | ORAL | 0 refills | Status: DC | PRN

## 2017-05-25 NOTE — ED Triage Notes (Signed)
Patient is alert and oriented x4.  She is being seen for right sided pain of unknown origin.  Patient states that she fell a few days ago but landed on the opposite side.  Current she rates her pain 10 of 10 with movement.  Pateint has had breats surgery with lymph node removal on the right side.

## 2017-05-25 NOTE — ED Notes (Signed)
Pt ambulated to the restroom with no assistance.

## 2017-05-25 NOTE — ED Provider Notes (Signed)
[place] DEPT Provider Note   CSN: [id number] Arrival date & time: 05/25/17  1122     History   Chief Complaint Chief Complaint  Patient presents with  . Chest Pain    HPI [name] is a 83 y.o. female.  HPI Patient presents with right-sided chest pain. [place] a few days ago but landed on her left side. Pain is worse with movement and palpation. No real shortness of breath. No swelling in her legs. No fevers or chills. Worse somewhat with moving her right arm also. No abdominal pain. Had broken ribs a few years ago but does not rub her where that was Past Medical History:  Diagnosis Date  . Adenomatous colon polyp 04/2005  . Allergic    Anectine  . ANXIETY 10/13/2007  . BACK PAIN 03/01/2007  . BREAST CANCER, HX OF 12/06/2007  . Cancer [facility])    RIGHT mastectomy with node dissection  . Complication of anesthesia    family aggergy to annectine  . Compression fracture of L4 lumbar vertebra (HCC) 02/2013  . CONSTIPATION 01/06/2009  . DEPRESSION 10/13/2007  . Diverticular disease   . DIVERTICULOSIS, COLON 05/11/2005  . External hemorrhoids without mention of complication 0/73/7106  . FATIGUE 08/07/2008  . HEMORRHOIDS, INTERNAL 05/11/2005  . Hypercalcemia 04/23/2009  . HYPERLIPIDEMIA NEC/NOS 03/01/2007  . HYPERTENSION 10/13/2007  . Impaired fasting glucose 11/14/2008  . INTERSTITIAL CYSTITIS 09/06/2007  . Irritable bowel syndrome 01/06/2009  . LIVER MASS 05/15/2009  . OSTEOPOROSIS 05/09/2009  . PARESTHESIA 09/06/2007  . POLYCYTHEMIA 04/23/2009  . STENOSIS, SPINAL, UNSPC REGION 03/01/2007  . Thyroid disease     Patient Active Problem List   Diagnosis Date Noted  . Cognitive deficit due to recent stroke 03/08/2017  . Alteration of sensation as late effect of stroke 03/08/2017  . Gait disturbance, post-stroke 03/08/2017  . Acute arterial ischemic stroke, vertebrobasilar, thalamic, left ([place]) 01/10/2017  . Neurologic gait disorder   . Depression   . Benign essential HTN   .  History of breast cancer   . Stroke (cerebrum) ([place]) 01/08/2017  . TIA (transient ischemic attack) 12/16/2016  . Dysarthria 12/16/2016  . Leg weakness 12/16/2016  . EKG abnormalities 12/16/2016  . Chronic pain 12/16/2016  . Elevated blood pressure 12/22/2014  . Internal hemorrhoids with bleeding and prolapse 11/04/2014  . Lactose intolerance 11/04/2014  . Hemorrhoid 09/11/2014  . Rectal bleeding 09/11/2014  . IBS (irritable bowel syndrome) 09/25/2013  . Hyperparathyroidism, primary ([place]) 05/26/2011  . Osteoporosis 05/09/2009  . POLYCYTHEMIA 04/23/2009  . IMPAIRED FASTING GLUCOSE 11/14/2008  . BREAST CANCER, HX OF 12/06/2007  . Anxiety state 10/13/2007  . Essential hypertension 10/13/2007  . INTERSTITIAL CYSTITIS 09/06/2007  . HLD (hyperlipidemia) 03/01/2007  . Backache 03/01/2007    Past Surgical History:  Procedure Laterality Date  . ABDOMINAL HYSTERECTOMY    . BACK SURGERY    . BREAST SURGERY     mastectomy -right  . CERVICAL SPINE SURGERY    . CHOLECYSTECTOMY    . COLONOSCOPY W/ BIOPSIES    . cortisone injection  10/16/12   left knee  . FIXATION KYPHOPLASTY LUMBAR SPINE    . HEMORRHOID BANDING  2015-16  . PARATHYROID EXPLORATION  06/29/2011   Procedure: PARATHYROID EXPLORATION;  Surgeon: Earnstine Regal, MD;  Location: [facility] ORS;  Service: General;  Laterality: Left;  Left Superior Parathyroidectomy  . PARATHYROIDECTOMY  06/29/11    OB History    No data available       Home Medications  Prior to Admission medications   Medication Sig Start Date End Date Taking? Authorizing Provider  aspirin 81 MG chewable tablet Chew 1 tablet (81 mg total) by mouth daily. 12/18/16  Yes Rosita Fire, MD  buPROPion (WELLBUTRIN XL) 300 MG 24 hr tablet Take 1 tablet (300 mg total) by mouth daily. 01/18/17  Yes Angiulli, Lavon Paganini, PA-C  carisoprodol (SOMA) 250 MG tablet Take 250 mg by mouth 2 (two) times daily.   Yes [provider]  Cholecalciferol (VITAMIN D3) 1000  units CAPS Take 1 capsule (1,000 Units total) by mouth daily. 01/18/17  Yes Angiulli, Lavon Paganini, PA-C  diazepam (VALIUM) 10 MG tablet Take 10 mg by mouth at bedtime.    Yes [provider]  hydrOXYzine (VISTARIL) 25 MG capsule Take 1 capsule (25 mg total) by mouth at bedtime. 01/18/17  Yes Angiulli, Lavon Paganini, PA-C  metoprolol tartrate (LOPRESSOR) 25 MG tablet Take 1 tablet (25 mg total) by mouth 2 (two) times daily. 01/18/17  Yes Angiulli, Lavon Paganini, PA-C  polyethylene glycol (MIRALAX / GLYCOLAX) packet Take 17 g by mouth daily. Patient taking differently: Take 17 g by mouth daily as needed for moderate constipation.  01/19/17  Yes Angiulli, Lavon Paganini, PA-C  acetaminophen (TYLENOL) 500 MG tablet Take 1,000 mg by mouth every 6 (six) hours as needed for mild pain.    [provider]  Alum & Mag Hydroxide-Simeth (GELUSIL PO) Take 1 tablet by mouth daily as needed (acid reflux).    [provider]  Ascorbic Acid (VITAMIN C PO) Take 1 tablet by mouth daily as needed (when not eating enough fruit).    [provider]  CALCIUM-MAGNESIUM PO Take 1 tablet by mouth daily.    [provider]  carisoprodol (SOMA) 350 MG tablet Take 1 tablet (350 mg total) by mouth daily as needed (muscle spasms). Patient not taking: Reported on 05/25/2017 01/18/17   Angiulli, Lavon Paganini, PA-C  celecoxib (CELEBREX) 200 MG capsule Take 1 capsule (200 mg total) by mouth daily as needed (arthritis pain). Patient not taking: Reported on 05/25/2017 01/18/17   Angiulli, Lavon Paganini, PA-C  clopidogrel (PLAVIX) 75 MG tablet Take 1 tablet (75 mg total) by mouth daily. Patient not taking: Reported on 05/25/2017 01/19/17   Angiulli, Lavon Paganini, PA-C  Cyanocobalamin (VITAMIN B-12 PO) Take 1 tablet by mouth daily.    [provider]  diclofenac sodium (VOLTAREN) 1 % GEL Apply 2 g topically 4 (four) times daily. Patient not taking: Reported on 05/25/2017 01/18/17   Angiulli, Lavon Paganini, PA-C  estradiol  (ESTRACE) 0.1 MG/GM vaginal cream Place 1 Applicatorful vaginally daily as needed (for dryness).     [provider]  fluticasone (FLONASE) 50 MCG/ACT nasal spray Place 2 sprays into both nostrils daily. Patient taking differently: Place 2 sprays into both nostrils daily as needed for allergies.  09/06/14   Hoyt Koch, MD  magic mouthwash w/lidocaine SOLN Take 5 mLs by mouth 3 (three) times daily. Patient not taking: Reported on 03/10/2017 01/25/17   Charlett Blake, MD  Multiple Vitamin (MULTIVITAMIN WITH MINERALS) TABS tablet Take 1 tablet by mouth daily as needed (when not eating well).    [provider]  NONFORMULARY OR COMPOUNDED ITEM Equal parts Nystatin 100,000 units/ml and viscous lidocaine 2%.  Swish 15 ml and spit q 6 hour prn disp 250 ml 0 RF Patient not taking: Reported on 05/25/2017 01/27/17   Charlett Blake, MD  oxyCODONE-acetaminophen (PERCOCET/ROXICET) 5-325 MG tablet Take 1-2  tablets by mouth every 8 (eight) hours as needed for severe pain. 05/25/17   Davonna Belling, MD  pravastatin (PRAVACHOL) 40 MG tablet Take 1 tablet (40 mg total) by mouth daily at 6 PM. Patient not taking: Reported on 05/25/2017 02/14/17   Angiulli, Lavon Paganini, PA-C  VITAMIN E PO Take 1 capsule by mouth daily.    [provider]  Zoledronic Acid (RECLAST IV) Inject into the vein. Yearly injection - last injection March 2018    [provider]    Family History Family History  Problem Relation Age of Onset  . Heart disease Sister   . Hypertension Sister   . Asthma Sister   . Breast cancer Maternal Aunt   . Ovarian cancer Paternal Aunt   . Colon cancer Neg Hx   . Esophageal cancer Neg Hx   . Pancreatic cancer Neg Hx   . Rectal cancer Neg Hx   . Stomach cancer Neg Hx     Social History Social History  Substance Use Topics  . Smoking status: Former Smoker    Packs/day: 1.00    Years: 42.00    Types: Cigarettes    Quit date: 08/23/1977  . Smokeless  tobacco: Never Used  . Alcohol use No     Allergies   Anectine [succinylcholine chloride]; Dilaudid [hydromorphone hcl]; Hydrocodone-acetaminophen; Iodine; Naproxen sodium; Oxycodone hcl; Pentosan polysulfate sodium; Pseudoephedrine; Succinylcholine; Tape; Tramadol hcl; and Shellfish allergy   Review of Systems Review of Systems  Constitutional: Negative for appetite change.  HENT: Negative for congestion.   Respiratory: Negative for shortness of breath.   Cardiovascular: Positive for chest pain.  Gastrointestinal: Negative for abdominal pain.  Genitourinary: Negative for flank pain.  Musculoskeletal: Negative for back pain.  Skin: Negative for rash.  Neurological: Negative for syncope and numbness.  Psychiatric/Behavioral: Negative for confusion.     Physical Exam Updated Vital Signs BP (!) 185/72 (BP Location: Left Arm)   Pulse 69   Temp 97.6 F (36.4 C) (Oral)   Resp 18   Ht 5\' 3"  (1.6 m)   Wt 69.4 kg (153 lb)   SpO2 95%   BMI 27.10 kg/m   Physical Exam  Constitutional: She appears well-developed.  HENT:  Head: Atraumatic.  Eyes: Pupils are equal, round, and reactive to light.  Neck: Neck supple.  Cardiovascular: Normal rate.   Pulmonary/Chest: Effort normal. She exhibits tenderness.  Tenderness right lateral mid chest wall. No crepitance or deformity. No rash.  Abdominal: There is no tenderness.  Musculoskeletal: She exhibits no edema.  Neurological: She is alert.  Skin: Skin is warm. Capillary refill takes less than 2 seconds.  Psychiatric: She has a normal mood and affect.     ED Treatments / Results  Labs (all labs ordered are listed, but only abnormal results are displayed) Labs Reviewed - No data to display  EKG  EKG Interpretation None       Radiology Dg Ribs Unilateral W/chest Right  Result Date: 05/25/2017 CLINICAL DATA:  Right sided chest pain of unknown etiology. [place] several days ago, but landed on the left. EXAM: RIGHT RIBS AND  CHEST - 3+ VIEW COMPARISON:  12/16/2016 FINDINGS: Heart size is normal. There is atherosclerosis of the aorta. The lungs are clear. No pneumothorax or hemothorax. Previous right mastectomy. Old healed rib fractures on the right. Acute nondisplaced fracture of the right seventh rib anteriorly. IMPRESSION: No active cardiopulmonary disease. Acute nondisplaced fracture of the right seventh rib anteriorly. Old healed rib fractures affecting multiple ribs.  Electronically Signed   By: Nelson Chimes M.D.   On: 05/25/2017 14:08    Procedures Procedures (including critical care time)  Medications Ordered in ED Medications  oxyCODONE-acetaminophen (PERCOCET/ROXICET) 5-325 MG per tablet 1 tablet (not administered)     Initial Impression / Assessment and Plan / ED Course  I have reviewed the triage vital signs and the nursing notes.  Pertinent labs & imaging results that were available during my care of the patient were reviewed by me and considered in my medical decision making (see chart for details).     Patient with fall a couple days ago. Right-sided chest pain. Point tender. Has rib fracture on x-ray. Likely cause of the pain. Not hypoxic. Discharge home.  Final Clinical Impressions(s) / ED Diagnoses   Final diagnoses:  Closed fracture of one rib of right side, initial encounter    New Prescriptions New Prescriptions   OXYCODONE-ACETAMINOPHEN (PERCOCET/ROXICET) 5-325 MG TABLET    Take 1-2 tablets by mouth every 8 (eight) hours as needed for severe pain.     Davonna Belling, MD 05/25/17 [phone]

## 2017-05-31 ENCOUNTER — Encounter: Payer: Medicare Other | Attending: Physical Medicine & Rehabilitation | Admitting: Psychology

## 2017-05-31 DIAGNOSIS — I10 Essential (primary) hypertension: Secondary | ICD-10-CM | POA: Insufficient documentation

## 2017-05-31 DIAGNOSIS — Z87891 Personal history of nicotine dependence: Secondary | ICD-10-CM | POA: Insufficient documentation

## 2017-05-31 DIAGNOSIS — F419 Anxiety disorder, unspecified: Secondary | ICD-10-CM | POA: Insufficient documentation

## 2017-05-31 DIAGNOSIS — R6889 Other general symptoms and signs: Secondary | ICD-10-CM | POA: Insufficient documentation

## 2017-05-31 DIAGNOSIS — Z853 Personal history of malignant neoplasm of breast: Secondary | ICD-10-CM | POA: Insufficient documentation

## 2017-05-31 DIAGNOSIS — Z825 Family history of asthma and other chronic lower respiratory diseases: Secondary | ICD-10-CM | POA: Insufficient documentation

## 2017-05-31 DIAGNOSIS — M810 Age-related osteoporosis without current pathological fracture: Secondary | ICD-10-CM | POA: Insufficient documentation

## 2017-05-31 DIAGNOSIS — I69398 Other sequelae of cerebral infarction: Secondary | ICD-10-CM | POA: Insufficient documentation

## 2017-05-31 DIAGNOSIS — Z7982 Long term (current) use of aspirin: Secondary | ICD-10-CM | POA: Insufficient documentation

## 2017-05-31 DIAGNOSIS — G8929 Other chronic pain: Secondary | ICD-10-CM | POA: Insufficient documentation

## 2017-05-31 DIAGNOSIS — K589 Irritable bowel syndrome without diarrhea: Secondary | ICD-10-CM | POA: Insufficient documentation

## 2017-05-31 DIAGNOSIS — Z8249 Family history of ischemic heart disease and other diseases of the circulatory system: Secondary | ICD-10-CM | POA: Insufficient documentation

## 2017-05-31 DIAGNOSIS — E785 Hyperlipidemia, unspecified: Secondary | ICD-10-CM | POA: Insufficient documentation

## 2017-05-31 DIAGNOSIS — D751 Secondary polycythemia: Secondary | ICD-10-CM | POA: Insufficient documentation

## 2017-05-31 DIAGNOSIS — R269 Unspecified abnormalities of gait and mobility: Secondary | ICD-10-CM | POA: Insufficient documentation

## 2017-05-31 DIAGNOSIS — Z8673 Personal history of transient ischemic attack (TIA), and cerebral infarction without residual deficits: Secondary | ICD-10-CM | POA: Insufficient documentation

## 2017-05-31 DIAGNOSIS — F329 Major depressive disorder, single episode, unspecified: Secondary | ICD-10-CM | POA: Insufficient documentation

## 2017-05-31 DIAGNOSIS — R42 Dizziness and giddiness: Secondary | ICD-10-CM | POA: Insufficient documentation

## 2017-07-05 ENCOUNTER — Ambulatory Visit (INDEPENDENT_AMBULATORY_CARE_PROVIDER_SITE_OTHER): Payer: Medicare Other | Admitting: Neurology

## 2017-07-05 ENCOUNTER — Encounter: Payer: Self-pay | Admitting: Neurology

## 2017-07-05 VITALS — BP 128/68 | HR 70 | Ht 63.0 in | Wt 158.2 lb

## 2017-07-05 DIAGNOSIS — I639 Cerebral infarction, unspecified: Secondary | ICD-10-CM | POA: Diagnosis not present

## 2017-07-05 DIAGNOSIS — E785 Hyperlipidemia, unspecified: Secondary | ICD-10-CM

## 2017-07-05 DIAGNOSIS — I63332 Cerebral infarction due to thrombosis of left posterior cerebral artery: Secondary | ICD-10-CM

## 2017-07-05 DIAGNOSIS — I10 Essential (primary) hypertension: Secondary | ICD-10-CM | POA: Diagnosis not present

## 2017-07-05 NOTE — Patient Instructions (Signed)
1.  Continue aspirin daily 2.  Continue Pravachol 40mg  daily 3.  Continue your blood pressure medication. 4.  Follow up in 6 months.

## 2017-07-05 NOTE — Progress Notes (Signed)
NEUROLOGY FOLLOW UP OFFICE NOTE  DANYAH GUASTELLA [id number]  HISTORY OF PRESENT ILLNESS: [name]. Mauri is an 83 year old woman with PTSD, depression, chronic subjective dizziness, chronic pain, IBS, MGUS, osteoporosis and history of breast cancer who follows up for stroke.    UPDATE: She was on dual antiplatelet therapy until August.  She is currently on ASA 81mg  daily.  Plavix made her bruise.  She is taking Pravachol 40mg .  She is feeling well.  She still has some tingling in the right hand.   HISTORY: She was admitted to [facility] from 12/16/16 to 12/17/16 after presenting slurred speech and weakness.  She woke up that morning feeling weak, with difficulty getting herself out of bed and with trouble walking.  Later at physical therapy, it was noted that she had slurred speech.  In the ED, her systolic blood pressure was in the 200s.  CT and MRI of head revealed chronic small vessel disease but no acute infarct.  MRA of head revealed atherosclerotic stenosis of the left MCA M2 branches, right PCA P2 segment and left PCA P1 and P2 segments but no emergent large vessel occlusion.  MRA of neck revealed no hemodynamically significant stenosis.  2D echo revealed LV EF 65-70% with no cardiac source of emboli.  LDL was 109.  Hgb A1c was 6.4.  She was started on ASA 81mg  and pravastatin 40mg .   She was readmitted from 01/07/17 to 01/10/17 for onset of right sided arm and leg weakness and right sided facial, arm and leg numbness with slurred speech.  CT head demonstrated no acute findings but MRI of brain showed acute left thalamic ischemic stroke, close proximity to the posterior limb of the left internal capsule.  Due to intracranial stenosis, she was started on dual antiplatelet therapy with ASA and Plavix for 3 months.  She returned to the ED on 02/07/17 for 4 days on increased intermittent right sided tingling of her face and hand.   She has started to regain feeling in her right  upper extremity.    PAST MEDICAL HISTORY: Past Medical History:  Diagnosis Date  . Adenomatous colon polyp 04/2005  . Allergic    Anectine  . ANXIETY 10/13/2007  . BACK PAIN 03/01/2007  . BREAST CANCER, HX OF 12/06/2007  . Cancer [facility])    RIGHT mastectomy with node dissection  . Complication of anesthesia    family aggergy to annectine  . Compression fracture of L4 lumbar vertebra (HCC) 02/2013  . CONSTIPATION 01/06/2009  . DEPRESSION 10/13/2007  . Diverticular disease   . DIVERTICULOSIS, COLON 05/11/2005  . External hemorrhoids without mention of complication 4/50/3888  . FATIGUE 08/07/2008  . HEMORRHOIDS, INTERNAL 05/11/2005  . Hypercalcemia 04/23/2009  . HYPERLIPIDEMIA NEC/NOS 03/01/2007  . HYPERTENSION 10/13/2007  . Impaired fasting glucose 11/14/2008  . INTERSTITIAL CYSTITIS 09/06/2007  . Irritable bowel syndrome 01/06/2009  . LIVER MASS 05/15/2009  . OSTEOPOROSIS 05/09/2009  . PARESTHESIA 09/06/2007  . POLYCYTHEMIA 04/23/2009  . STENOSIS, SPINAL, UNSPC REGION 03/01/2007  . Thyroid disease     MEDICATIONS: Current Outpatient Medications on File Prior to Visit  Medication Sig Dispense Refill  . acetaminophen (TYLENOL) 500 MG tablet Take 1,000 mg by mouth every 6 (six) hours as needed for mild pain.    Marland Kitchen Alum & Mag Hydroxide-Simeth (GELUSIL PO) Take 1 tablet by mouth daily as needed (acid reflux).    . Ascorbic Acid (VITAMIN C PO) Take 1 tablet by mouth daily as needed (when  not eating enough fruit).    Marland Kitchen aspirin 81 MG chewable tablet Chew 1 tablet (81 mg total) by mouth daily. 30 tablet 0  . buPROPion (WELLBUTRIN XL) 300 MG 24 hr tablet Take 1 tablet (300 mg total) by mouth daily. 30 tablet 1  . CALCIUM-MAGNESIUM PO Take 1 tablet by mouth daily.    . carisoprodol (SOMA) 250 MG tablet Take 250 mg by mouth 2 (two) times daily.    . carisoprodol (SOMA) 350 MG tablet Take 1 tablet (350 mg total) by mouth daily as needed (muscle spasms). (Patient not taking: Reported on 05/25/2017) 30 tablet 0    . celecoxib (CELEBREX) 200 MG capsule Take 1 capsule (200 mg total) by mouth daily as needed (arthritis pain). (Patient not taking: Reported on 05/25/2017) 30 capsule 0  . Cholecalciferol (VITAMIN D3) 1000 units CAPS Take 1 capsule (1,000 Units total) by mouth daily. 60 capsule 0  . clopidogrel (PLAVIX) 75 MG tablet Take 1 tablet (75 mg total) by mouth daily. (Patient not taking: Reported on 05/25/2017) 30 tablet 1  . Cyanocobalamin (VITAMIN B-12 PO) Take 1 tablet by mouth daily.    . diazepam (VALIUM) 10 MG tablet Take 10 mg by mouth at bedtime.     . diclofenac sodium (VOLTAREN) 1 % GEL Apply 2 g topically 4 (four) times daily. (Patient not taking: Reported on 05/25/2017) 1 Tube 0  . estradiol (ESTRACE) 0.1 MG/GM vaginal cream Place 1 Applicatorful vaginally daily as needed (for dryness).     . fluticasone (FLONASE) 50 MCG/ACT nasal spray Place 2 sprays into both nostrils daily. (Patient taking differently: Place 2 sprays into both nostrils daily as needed for allergies. ) 16 g 2  . hydrOXYzine (VISTARIL) 25 MG capsule Take 1 capsule (25 mg total) by mouth at bedtime. 30 capsule 0  . magic mouthwash w/lidocaine SOLN Take 5 mLs by mouth 3 (three) times daily. (Patient not taking: Reported on 03/10/2017) 250 mL 0  . metoprolol tartrate (LOPRESSOR) 25 MG tablet Take 1 tablet (25 mg total) by mouth 2 (two) times daily. 60 tablet 1  . Multiple Vitamin (MULTIVITAMIN WITH MINERALS) TABS tablet Take 1 tablet by mouth daily as needed (when not eating well).    . NONFORMULARY OR COMPOUNDED ITEM Equal parts Nystatin 100,000 units/ml and viscous lidocaine 2%.  Swish 15 ml and spit q 6 hour prn disp 250 ml 0 RF (Patient not taking: Reported on 05/25/2017) 250 each 0  . oxyCODONE-acetaminophen (PERCOCET/ROXICET) 5-325 MG tablet Take 1-2 tablets by mouth every 8 (eight) hours as needed for severe pain. 8 tablet 0  . polyethylene glycol (MIRALAX / GLYCOLAX) packet Take 17 g by mouth daily. (Patient taking differently:  Take 17 g by mouth daily as needed for moderate constipation. ) 14 each 0  . pravastatin (PRAVACHOL) 40 MG tablet Take 1 tablet (40 mg total) by mouth daily at 6 PM. (Patient not taking: Reported on 05/25/2017) 30 tablet 0  . VITAMIN E PO Take 1 capsule by mouth daily.    . Zoledronic Acid (RECLAST IV) Inject into the vein. Yearly injection - last injection March 2018     No current facility-administered medications on file prior to visit.     ALLERGIES: Allergies  Allergen Reactions  . Anectine [Succinylcholine Chloride] Other (See Comments)    Reaction:  Patient states that her body wasn't functioning.  Pt states she was put on life support.   . Dilaudid [Hydromorphone Hcl] Other (See Comments)    Slept for  3 days and 3 nights  . Hydrocodone-Acetaminophen Other (See Comments)    Hallucinations, paranoia, amnesia Pt takes Tylenol at home  . Iodine Hives and Other (See Comments)    , asthma  . Naproxen Sodium Other (See Comments)    REACTION: Asthma  . Oxycodone Hcl Other (See Comments)     Hallucinations, paranoia, amnesia Pt takes Percocet at home with no issues  . Pentosan Polysulfate Sodium Other (See Comments)    REACTION: unspecified per patient  . Pseudoephedrine Other (See Comments)     Palpitations  . Succinylcholine     REACTION: "Can't wake up".  . Tape Dermatitis    Red inflammed  . Tramadol Hcl Other (See Comments)    Unknown reaction  . Shellfish Allergy     Runny nose, cough, hoarseness    FAMILY HISTORY: Family History  Problem Relation Age of Onset  . Heart disease Sister   . Hypertension Sister   . Asthma Sister   . Breast cancer Maternal Aunt   . Ovarian cancer Paternal Aunt   . Colon cancer Neg Hx   . Esophageal cancer Neg Hx   . Pancreatic cancer Neg Hx   . Rectal cancer Neg Hx   . Stomach cancer Neg Hx     SOCIAL HISTORY: Social History   Socioeconomic History  . Marital status: Legally Separated    Spouse name: Not on file  . Number  of children: 0  . Years of education: Not on file  . Highest education level: Not on file  Social Needs  . Financial resource strain: Not on file  . Food insecurity - worry: Not on file  . Food insecurity - inability: Not on file  . Transportation needs - medical: Not on file  . Transportation needs - non-medical: Not on file  Occupational History  . Not on file  Tobacco Use  . Smoking status: Former Smoker    Packs/day: 1.00    Years: 42.00    Pack years: 42.00    Types: Cigarettes    Last attempt to quit: 08/23/1977    Years since quitting: 39.8  . Smokeless tobacco: Never Used  Substance and Sexual Activity  . Alcohol use: No  . Drug use: No  . Sexual activity: Not on file  Other Topics Concern  . Not on file  Social History Narrative  . Not on file    REVIEW OF SYSTEMS: Constitutional: No fevers, chills, or sweats, no generalized fatigue, change in appetite Eyes: No visual changes, double vision, eye pain Ear, nose and throat: No hearing loss, ear pain, nasal congestion, sore throat Cardiovascular: No chest pain, palpitations Respiratory:  No shortness of breath at rest or with exertion, wheezes GastrointestinaI: No nausea, vomiting, diarrhea, abdominal pain, fecal incontinence Genitourinary:  No dysuria, urinary retention or frequency Musculoskeletal:  No neck pain, back pain Integumentary: No rash, pruritus, skin lesions Neurological: as above Psychiatric: No depression, insomnia, anxiety Endocrine: No palpitations, fatigue, diaphoresis, mood swings, change in appetite, change in weight, increased thirst Hematologic/Lymphatic:  No purpura, petechiae. Allergic/Immunologic: no itchy/runny eyes, nasal congestion, recent allergic reactions, rashes  PHYSICAL EXAM: Vitals:   07/05/17 1134  BP: 128/68  Pulse: 70  SpO2: 96%   General: No acute distress.  Patient appears well-groomed.  Head:  Normocephalic/atraumatic Eyes:  Fundi examined but not visualized Neck:  supple, no paraspinal tenderness, full range of motion Heart:  Regular rate and rhythm Lungs:  Clear to auscultation bilaterally Back: No paraspinal tenderness Neurological  Exam: alert and oriented to person, place, and time. Attention span and concentration intact, recent and remote memory intact, fund of knowledge intact.  Speech fluent and not dysarthric, language intact.  CN II-XII intact. Bulk and tone normal, muscle strength 5/5 throughout.  Sensation to light touch, temperature and vibration intact.  Deep tendon reflexes 2+ throughout, toes downgoing.  Finger to nose and heel to shin testing intact.  Wide-based gait with short steps.  Unsteady when turns.  Romberg with sway.  IMPRESSION: Left thalamic stroke secondary to small vessel disease Hypertension Hyperlipidemia  PLAN: 1.  Continue ASA 81mg  daily for secondary stroke prevention 2.  Continue pravastatin 40mg  daily as per PCP (LDL goal less than 70) 3.  Continue blood pressure control 4.  From my standpoint, she may resume driving 5.  Follow up in 6 months.  20 minutes spent face to face with patient, over 50% spent discussing management.  Metta Clines, DO  CC:  Seward Carol, MD

## 2017-07-16 ENCOUNTER — Other Ambulatory Visit: Payer: Self-pay

## 2017-07-16 ENCOUNTER — Emergency Department (HOSPITAL_COMMUNITY)
Admission: EM | Admit: 2017-07-16 | Discharge: 2017-07-16 | Disposition: A | Discharge status: Home / Self Care | Payer: Medicare Other | Attending: Emergency Medicine | Admitting: Emergency Medicine

## 2017-07-16 ENCOUNTER — Emergency Department (HOSPITAL_COMMUNITY): Payer: Medicare Other

## 2017-07-16 DIAGNOSIS — Y999 Unspecified external cause status: Secondary | ICD-10-CM | POA: Insufficient documentation

## 2017-07-16 DIAGNOSIS — Y929 Unspecified place or not applicable: Secondary | ICD-10-CM | POA: Diagnosis not present

## 2017-07-16 DIAGNOSIS — Z7901 Long term (current) use of anticoagulants: Secondary | ICD-10-CM | POA: Insufficient documentation

## 2017-07-16 DIAGNOSIS — S20319A Abrasion of unspecified front wall of thorax, initial encounter: Secondary | ICD-10-CM | POA: Insufficient documentation

## 2017-07-16 DIAGNOSIS — T148XXA Other injury of unspecified body region, initial encounter: Secondary | ICD-10-CM

## 2017-07-16 DIAGNOSIS — Z8673 Personal history of transient ischemic attack (TIA), and cerebral infarction without residual deficits: Secondary | ICD-10-CM | POA: Diagnosis not present

## 2017-07-16 DIAGNOSIS — W010XXA Fall on same level from slipping, tripping and stumbling without subsequent striking against object, initial encounter: Secondary | ICD-10-CM | POA: Insufficient documentation

## 2017-07-16 DIAGNOSIS — Z87891 Personal history of nicotine dependence: Secondary | ICD-10-CM | POA: Diagnosis not present

## 2017-07-16 DIAGNOSIS — S2242XA Multiple fractures of ribs, left side, initial encounter for closed fracture: Secondary | ICD-10-CM | POA: Diagnosis not present

## 2017-07-16 DIAGNOSIS — Y939 Activity, unspecified: Secondary | ICD-10-CM | POA: Insufficient documentation

## 2017-07-16 DIAGNOSIS — S299XXA Unspecified injury of thorax, initial encounter: Secondary | ICD-10-CM | POA: Diagnosis present

## 2017-07-16 DIAGNOSIS — Z853 Personal history of malignant neoplasm of breast: Secondary | ICD-10-CM | POA: Diagnosis not present

## 2017-07-16 DIAGNOSIS — I10 Essential (primary) hypertension: Secondary | ICD-10-CM | POA: Insufficient documentation

## 2017-07-16 DIAGNOSIS — Z79899 Other long term (current) drug therapy: Secondary | ICD-10-CM | POA: Diagnosis not present

## 2017-07-16 MED ORDER — OXYCODONE-ACETAMINOPHEN 5-325 MG PO TABS: 1.0000 | ORAL_TABLET | Freq: Four times a day (QID) | ORAL | 0 refills | Status: DC | PRN

## 2017-07-16 MED ORDER — OXYCODONE-ACETAMINOPHEN 5-325 MG PO TABS
2.0000 | ORAL_TABLET | Freq: Once | ORAL | Status: AC
Start: 2017-07-16 — End: 2017-07-16
  Administered 2017-07-16: 2 via ORAL
  Filled 2017-07-16: qty 2

## 2017-07-16 NOTE — ED Notes (Addendum)
Pt unable to find ride home; taxi called and voucher given per RN and CN

## 2017-07-16 NOTE — ED Notes (Signed)
Instructed in the use of an incentive spirometer.  IC - 1200 cc X 10 attempts.

## 2017-07-16 NOTE — ED Provider Notes (Signed)
[facility] EMERGENCY DEPARTMENT Provider Note   CSN: [id number] Arrival date & time: 07/16/17  4496     History   Chief Complaint Chief Complaint  Patient presents with  . Fall    HPI [name] is a 83 y.o. female.  HPI   83 year old female with left chest pain.  Patient on mechanical fall yesterday.  She slipped on a dog toy and fell onto her left side.  She has had severe persistent left-sided chest pain since then.  Worse with movement.  She does not think she struck her head.  Denies any headaches or neck pain.  She takes Plavix.  No acute numbness, tingling or focal loss of strength.  Past Medical History:  Diagnosis Date  . Adenomatous colon polyp 04/2005  . Allergic    Anectine  . ANXIETY 10/13/2007  . BACK PAIN 03/01/2007  . BREAST CANCER, HX OF 12/06/2007  . Cancer [facility])    RIGHT mastectomy with node dissection  . Complication of anesthesia    family aggergy to annectine  . Compression fracture of L4 lumbar vertebra (HCC) 02/2013  . CONSTIPATION 01/06/2009  . DEPRESSION 10/13/2007  . Diverticular disease   . DIVERTICULOSIS, COLON 05/11/2005  . External hemorrhoids without mention of complication 7/59/1638  . FATIGUE 08/07/2008  . HEMORRHOIDS, INTERNAL 05/11/2005  . Hypercalcemia 04/23/2009  . HYPERLIPIDEMIA NEC/NOS 03/01/2007  . HYPERTENSION 10/13/2007  . Impaired fasting glucose 11/14/2008  . INTERSTITIAL CYSTITIS 09/06/2007  . Irritable bowel syndrome 01/06/2009  . LIVER MASS 05/15/2009  . OSTEOPOROSIS 05/09/2009  . PARESTHESIA 09/06/2007  . POLYCYTHEMIA 04/23/2009  . STENOSIS, SPINAL, UNSPC REGION 03/01/2007  . Thyroid disease     Patient Active Problem List   Diagnosis Date Noted  . Cognitive deficit due to recent stroke 03/08/2017  . Alteration of sensation as late effect of stroke 03/08/2017  . Gait disturbance, post-stroke 03/08/2017  . Acute arterial ischemic stroke, vertebrobasilar, thalamic, left ([place]) 01/10/2017  . Neurologic  gait disorder   . Depression   . Benign essential HTN   . History of breast cancer   . Stroke (cerebrum) ([place]) 01/08/2017  . TIA (transient ischemic attack) 12/16/2016  . Dysarthria 12/16/2016  . Leg weakness 12/16/2016  . EKG abnormalities 12/16/2016  . Chronic pain 12/16/2016  . Elevated blood pressure 12/22/2014  . Internal hemorrhoids with bleeding and prolapse 11/04/2014  . Lactose intolerance 11/04/2014  . Hemorrhoid 09/11/2014  . Rectal bleeding 09/11/2014  . IBS (irritable bowel syndrome) 09/25/2013  . Hyperparathyroidism, primary ([place]) 05/26/2011  . Osteoporosis 05/09/2009  . POLYCYTHEMIA 04/23/2009  . IMPAIRED FASTING GLUCOSE 11/14/2008  . BREAST CANCER, HX OF 12/06/2007  . Anxiety state 10/13/2007  . Essential hypertension 10/13/2007  . INTERSTITIAL CYSTITIS 09/06/2007  . HLD (hyperlipidemia) 03/01/2007  . Backache 03/01/2007    Past Surgical History:  Procedure Laterality Date  . ABDOMINAL HYSTERECTOMY    . BACK SURGERY    . BREAST SURGERY     mastectomy -right  . CERVICAL SPINE SURGERY    . CHOLECYSTECTOMY    . COLONOSCOPY W/ BIOPSIES    . cortisone injection  10/16/12   left knee  . FIXATION KYPHOPLASTY LUMBAR SPINE    . HEMORRHOID BANDING  2015-16  . PARATHYROID EXPLORATION  06/29/2011   Procedure: PARATHYROID EXPLORATION;  Surgeon: Earnstine Regal, MD;  Location: [facility] ORS;  Service: General;  Laterality: Left;  Left Superior Parathyroidectomy  . PARATHYROIDECTOMY  06/29/11    OB History    No  data available       Home Medications    Prior to Admission medications   Medication Sig Start Date End Date Taking? Authorizing Provider  acetaminophen (TYLENOL) 500 MG tablet Take 1,000 mg by mouth every 6 (six) hours as needed for mild pain.   Yes [provider]  Alum & Mag Hydroxide-Simeth (GELUSIL PO) Take 1 tablet by mouth daily as needed (acid reflux).   Yes [provider]  Ascorbic Acid (VITAMIN C PO) Take 1 tablet by mouth daily as  needed (when not eating enough fruit).   Yes [provider]  aspirin 81 MG chewable tablet Chew 1 tablet (81 mg total) by mouth daily. 12/18/16  Yes Rosita Fire, MD  buPROPion (WELLBUTRIN XL) 300 MG 24 hr tablet Take 1 tablet (300 mg total) by mouth daily. 01/18/17  Yes Angiulli, Lavon Paganini, PA-C  CALCIUM-MAGNESIUM PO Take 1 tablet by mouth daily.   Yes [provider]  carisoprodol (SOMA) 250 MG tablet Take 250 mg by mouth 2 (two) times daily.   Yes [provider]  carisoprodol (SOMA) 350 MG tablet Take 1 tablet (350 mg total) by mouth daily as needed (muscle spasms). 01/18/17  Yes Angiulli, Lavon Paganini, PA-C  celecoxib (CELEBREX) 200 MG capsule Take 1 capsule (200 mg total) by mouth daily as needed (arthritis pain). 01/18/17  Yes Angiulli, Lavon Paganini, PA-C  Cholecalciferol (VITAMIN D3) 1000 units CAPS Take 1 capsule (1,000 Units total) by mouth daily. 01/18/17  Yes Angiulli, Lavon Paganini, PA-C  clopidogrel (PLAVIX) 75 MG tablet Take 1 tablet (75 mg total) by mouth daily. 01/19/17  Yes Angiulli, Lavon Paganini, PA-C  Cyanocobalamin (VITAMIN B-12 PO) Take 1 tablet by mouth daily.   Yes [provider]  diazepam (VALIUM) 10 MG tablet Take 10 mg by mouth at bedtime.    Yes [provider]  diclofenac sodium (VOLTAREN) 1 % GEL Apply 2 g topically 4 (four) times daily. 01/18/17  Yes Angiulli, Lavon Paganini, PA-C  estradiol (ESTRACE) 0.1 MG/GM vaginal cream Place 1 Applicatorful vaginally daily as needed (for dryness).    Yes [provider]  fluticasone (FLONASE) 50 MCG/ACT nasal spray Place 2 sprays into both nostrils daily. Patient taking differently: Place 2 sprays into both nostrils daily as needed for allergies.  09/06/14  Yes Hoyt Koch, MD  Multiple Vitamin (MULTIVITAMIN WITH MINERALS) TABS tablet Take 1 tablet by mouth daily as needed (when not eating well).   Yes [provider]  VITAMIN E PO Take 1 capsule by mouth daily.   Yes [provider]  Zoledronic Acid (RECLAST IV) Inject into the vein. Yearly injection - last injection March 2018   Yes [provider]  hydrOXYzine (VISTARIL) 25 MG capsule Take 1 capsule (25 mg total) by mouth at bedtime. Patient not taking: Reported on 07/16/2017 01/18/17   Angiulli, Lavon Paganini, PA-C  metoprolol tartrate (LOPRESSOR) 25 MG tablet Take 1 tablet (25 mg total) by mouth 2 (two) times daily. 01/18/17   Angiulli, Lavon Paganini, PA-C  NONFORMULARY OR COMPOUNDED ITEM Equal parts Nystatin 100,000 units/ml and viscous lidocaine 2%.  Swish 15 ml and spit q 6 hour prn disp 250 ml 0 RF Patient not taking: Reported on 05/25/2017 01/27/17   Charlett Blake, MD  oxyCODONE-acetaminophen (PERCOCET/ROXICET) 5-325 MG tablet Take 1-2 tablets by mouth every 8 (eight) hours as needed for severe pain. Patient not taking: Reported on 07/16/2017 05/25/17   Davonna Belling, MD  polyethylene glycol [facility] /  GLYCOLAX) packet Take 17 g by mouth daily. Patient not taking: Reported on 07/16/2017 01/19/17   Angiulli, Lavon Paganini, PA-C  pravastatin (PRAVACHOL) 40 MG tablet Take 1 tablet (40 mg total) by mouth daily at 6 PM. Patient not taking: Reported on 05/25/2017 02/14/17   Angiulli, Lavon Paganini, PA-C    Family History Family History  Problem Relation Age of Onset  . Heart disease Sister   . Hypertension Sister   . Asthma Sister   . Breast cancer Maternal Aunt   . Ovarian cancer Paternal Aunt   . Colon cancer Neg Hx   . Esophageal cancer Neg Hx   . Pancreatic cancer Neg Hx   . Rectal cancer Neg Hx   . Stomach cancer Neg Hx     Social History Social History   Tobacco Use  . Smoking status: Former Smoker    Packs/day: 1.00    Years: 42.00    Pack years: 42.00    Types: Cigarettes    Last attempt to quit: 08/23/1977    Years since quitting: 39.9  . Smokeless tobacco: Never Used  Substance Use Topics  . Alcohol use: No  . Drug use: No     Allergies   Anectine [succinylcholine chloride];  Dilaudid [hydromorphone hcl]; Hydrocodone-acetaminophen; Iodine; Naproxen sodium; Oxycodone hcl; Pentosan polysulfate sodium; Pseudoephedrine; Succinylcholine; Tape; Tramadol hcl; and Shellfish allergy   Review of Systems Review of Systems  All systems reviewed and negative, other than as noted in HPI.  Physical Exam Updated Vital Signs BP (!) 184/52 (BP Location: Left Arm)   Pulse 92   Temp 98.2 F (36.8 C) (Oral)   Resp 18   Ht 5\' 3"  (1.6 m)   Wt 69.4 kg (153 lb)   SpO2 94%   BMI 27.10 kg/m   Physical Exam  Constitutional: She is oriented to person, place, and time. She appears well-developed and well-nourished. No distress.  HENT:  Head: Normocephalic and atraumatic.  Eyes: Conjunctivae are normal. Right eye exhibits no discharge. Left eye exhibits no discharge.  Neck: Neck supple.  Cardiovascular: Normal rate, regular rhythm and normal heart sounds. Exam reveals no gallop and no friction rub.  No murmur heard. Pulmonary/Chest: Effort normal and breath sounds normal. No respiratory distress.  Abdominal: Soft. She exhibits no distension. There is no tenderness.  Musculoskeletal: She exhibits no edema or tenderness.  Exquisite tenderness to palpation just beneath the left breast and into the left axilla.  No overlying skin changes.  No crepitus.  No midline spinal tenderness.  Neurological: She is alert and oriented to person, place, and time. No cranial nerve deficit. She exhibits normal muscle tone. Coordination normal.  Skin: Skin is warm and dry.  Psychiatric: She has a normal mood and affect. Her behavior is normal. Thought content normal.  Nursing note and vitals reviewed.    ED Treatments / Results  Labs (all labs ordered are listed, but only abnormal results are displayed) Labs Reviewed - No data to display  EKG  EKG Interpretation None       Radiology Dg Ribs Unilateral W/chest Left  Result Date: 07/16/2017 CLINICAL DATA:  Fall and pain on left side.  BB was placed at the area of concern. EXAM: LEFT RIBS AND CHEST - 3+ VIEW COMPARISON:  05/25/2017 FINDINGS: Lungs are clear without a pneumothorax. Old right rib fractures with deformities. Multiple surgical clips in the right axilla. Prior right mastectomy. Heart and mediastinum are within normal limits. Atherosclerotic calcifications at the aortic arch. Displaced fracture  of the left fourth rib. There may be a mildly displaced fracture of the left seventh rib. Again noted are old compression fractures with vertebral body augmentation in lumbar spine. IMPRESSION: Mildly displaced fracture of the left fourth rib. Question a fracture of the left seventh rib. Old right rib fractures and old vertebral body compression fractures. Electronically Signed   By: Markus Daft M.D.   On: 07/16/2017 11:02    Procedures Procedures (including critical care time)  Medications Ordered in ED Medications  oxyCODONE-acetaminophen (PERCOCET/ROXICET) 5-325 MG per tablet 2 tablet (2 tablets Oral Given 07/16/17 1013)     Initial Impression / Assessment and Plan / ED Course  I have reviewed the triage vital signs and the nursing notes.  Pertinent labs & imaging results that were available during my care of the patient were reviewed by me and considered in my medical decision making (see chart for details).     83 year old female with left chest pain after mechanical fall yesterday.  Left fourth rib and possible seventh rib fractures.  No pneumo.  Plan pain control.  Incentive spirometry.  Final Clinical Impressions(s) / ED Diagnoses   Final diagnoses:  Closed fracture of multiple ribs of left side, initial encounter  Abrasion    ED Discharge Orders    None       Virgel Manifold, MD 07/16/17 1226

## 2017-07-16 NOTE — ED Triage Notes (Signed)
Patient suffered a fall last night. States that she broke her fall with her right arm and fell on her left side. Denies head trauma. States that she is taking plavix.  She is from home alone but has help 5 days a week. Bruising is noted on right wrist and right upper arm and left arm just above her elbow. Patient C/O left lower chest pain.

## 2017-07-16 NOTE — Discharge Instructions (Signed)
You broke your L fourth rib and possibly your L seventh rib. You do not have a pneumothorax (collapsed lung). You are being prescribed pain medication to take as needed. Use incentive spirometer as instructed. Follow-up with our PCP.

## 2017-07-18 ENCOUNTER — Ambulatory Visit: Payer: Medicare Other | Admitting: Psychology

## 2017-08-11 ENCOUNTER — Ambulatory Visit: Payer: Medicare Other | Admitting: Primary Care

## 2017-09-08 ENCOUNTER — Other Ambulatory Visit: Payer: Self-pay | Admitting: Internal Medicine

## 2017-09-08 ENCOUNTER — Ambulatory Visit
Admission: RE | Admit: 2017-09-08 | Discharge: 2017-09-08 | Disposition: A | Discharge status: Home / Self Care | Payer: Medicare Other | Source: Ambulatory Visit | Attending: Internal Medicine | Admitting: Internal Medicine

## 2017-09-08 DIAGNOSIS — R0789 Other chest pain: Secondary | ICD-10-CM

## 2017-09-22 ENCOUNTER — Ambulatory Visit (INDEPENDENT_AMBULATORY_CARE_PROVIDER_SITE_OTHER): Payer: Medicare Other

## 2017-09-22 ENCOUNTER — Ambulatory Visit (HOSPITAL_COMMUNITY)
Admission: EM | Admit: 2017-09-22 | Discharge: 2017-09-22 | Disposition: A | Discharge status: Home / Self Care | Payer: Medicare Other | Attending: Family Medicine | Admitting: Family Medicine

## 2017-09-22 ENCOUNTER — Encounter (HOSPITAL_COMMUNITY): Payer: Self-pay | Admitting: Emergency Medicine

## 2017-09-22 DIAGNOSIS — S46011A Strain of muscle(s) and tendon(s) of the rotator cuff of right shoulder, initial encounter: Secondary | ICD-10-CM

## 2017-09-22 DIAGNOSIS — W19XXXA Unspecified fall, initial encounter: Secondary | ICD-10-CM

## 2017-09-22 DIAGNOSIS — M25511 Pain in right shoulder: Secondary | ICD-10-CM

## 2017-09-22 NOTE — Discharge Instructions (Signed)
Stop taking soma and follow up with your pcp and pain doctor

## 2017-09-22 NOTE — ED Triage Notes (Signed)
PT fell last night and injured her right shoulder and right arm. PT had rotator cuff surgery on this arm in the past.

## 2017-09-22 NOTE — ED Provider Notes (Signed)
[place]   [id number] 09/22/17 Arrival Time: [place] PLAN:  1. Fall, initial encounter   2. Acute pain of right shoulder   3. Strain of right rotator cuff capsule, initial encounter     Stop taking Soma and follow up with your pain doctor and PCP.  Reviewed expectations re: course of current medical issues. Questions answered. Outlined signs and symptoms indicating need for more acute intervention. Patient verbalized understanding. After Visit Summary given.   SUBJECTIVE: History from: patient. [name] is a 84 y.o. female who presents with complaint of persistent right arm and shoulder pain after fall last night.  Patient fell on outstretched right hand and she woke up this morning in pain. Reports abrupt onset yesterday. Described symptoms have gradually worsened since beginning.  ROS: As per HPI.   OBJECTIVE:  Vitals:   09/22/17 1136  BP: (!) 152/81  Pulse: (!) 103  Resp: 16  Temp: 98.9 F (37.2 C)  TempSrc: Oral  SpO2: 96%  Weight: 155 lb (70.3 kg)    General appearance: alert; no distress Eyes: PERRLA; EOMI; conjunctiva normal HENT: normocephalic; atraumatic; TMs normal; nasal mucosa normal; oral mucosa normal Neck: supple  Lungs: clear to auscultation bilaterally Heart: regular rate and rhythm Abdomen: soft, non-tender; bowel sounds normal; no masses or organomegaly; no guarding or rebound tenderness MS - TTP right shoulder and decreased ROM with right shoulder and pain with abduction, internal and external rotation. Back: no CVA tendernessSkin: warm and dry Neurologic: normal gait; normal symmetric reflexes Psychological: alert and cooperative; normal mood and affect  Labs:  Labs Reviewed - No data to display  Imaging: No results found.  Allergies  Allergen Reactions  . Anectine [Succinylcholine Chloride] Other (See Comments)    Reaction:  Patient states that her body wasn't functioning.  Pt states she was put  on life support.   . Dilaudid [Hydromorphone Hcl] Other (See Comments)    Slept for 3 days and 3 nights  . Hydrocodone-Acetaminophen Other (See Comments)    Hallucinations, paranoia, amnesia Pt takes Tylenol at home  . Iodine Hives and Other (See Comments)    , asthma  . Naproxen Sodium Other (See Comments)    REACTION: Asthma  . Oxycodone Hcl Other (See Comments)     Hallucinations, paranoia, amnesia Pt takes Percocet at home with no issues  . Pentosan Polysulfate Sodium Other (See Comments)    REACTION: unspecified per patient  . Pseudoephedrine Other (See Comments)     Palpitations  . Succinylcholine     REACTION: "Can't wake up".  . Tape Dermatitis    Red inflammed  . Tramadol Hcl Other (See Comments)    Unknown reaction  . Shellfish Allergy     Runny nose, cough, hoarseness    Past Medical History:  Diagnosis Date  . Adenomatous colon polyp 04/2005  . Allergic    Anectine  . ANXIETY 10/13/2007  . BACK PAIN 03/01/2007  . BREAST CANCER, HX OF 12/06/2007  . Cancer [facility])    RIGHT mastectomy with node dissection  . Complication of anesthesia    family aggergy to annectine  . Compression fracture of L4 lumbar vertebra (HCC) 02/2013  . CONSTIPATION 01/06/2009  . DEPRESSION 10/13/2007  . Diverticular disease   . DIVERTICULOSIS, COLON 05/11/2005  . External hemorrhoids without mention of complication 9/38/1829  . FATIGUE 08/07/2008  . HEMORRHOIDS, INTERNAL 05/11/2005  . Hypercalcemia 04/23/2009  . HYPERLIPIDEMIA NEC/NOS 03/01/2007  . HYPERTENSION 10/13/2007  . Impaired fasting  glucose 11/14/2008  . INTERSTITIAL CYSTITIS 09/06/2007  . Irritable bowel syndrome 01/06/2009  . LIVER MASS 05/15/2009  . OSTEOPOROSIS 05/09/2009  . PARESTHESIA 09/06/2007  . POLYCYTHEMIA 04/23/2009  . STENOSIS, SPINAL, UNSPC REGION 03/01/2007  . Thyroid disease    Social History   Socioeconomic History  . Marital status: Legally Separated    Spouse name: Not on file  . Number of children: 0  . Years of  education: Not on file  . Highest education level: Not on file  Social Needs  . Financial resource strain: Not on file  . Food insecurity - worry: Not on file  . Food insecurity - inability: Not on file  . Transportation needs - medical: Not on file  . Transportation needs - non-medical: Not on file  Occupational History  . Not on file  Tobacco Use  . Smoking status: Former Smoker    Packs/day: 1.00    Years: 42.00    Pack years: 42.00    Types: Cigarettes    Last attempt to quit: 08/23/1977    Years since quitting: 40.1  . Smokeless tobacco: Never Used  Substance and Sexual Activity  . Alcohol use: No  . Drug use: No  . Sexual activity: Not on file  Other Topics Concern  . Not on file  Social History Narrative  . Not on file   Family History  Problem Relation Age of Onset  . Heart disease Sister   . Hypertension Sister   . Asthma Sister   . Breast cancer Maternal Aunt   . Ovarian cancer Paternal Aunt   . Colon cancer Neg Hx   . Esophageal cancer Neg Hx   . Pancreatic cancer Neg Hx   . Rectal cancer Neg Hx   . Stomach cancer Neg Hx    Past Surgical History:  Procedure Laterality Date  . ABDOMINAL HYSTERECTOMY    . BACK SURGERY    . BREAST SURGERY     mastectomy -right  . CERVICAL SPINE SURGERY    . CHOLECYSTECTOMY    . COLONOSCOPY W/ BIOPSIES    . cortisone injection  10/16/12   left knee  . FIXATION KYPHOPLASTY LUMBAR SPINE    . HEMORRHOID BANDING  2015-16  . PARATHYROID EXPLORATION  06/29/2011   Procedure: PARATHYROID EXPLORATION;  Surgeon: Earnstine Regal, MD;  Location: [facility];  Service: General;  Laterality: Left;  Left Superior Parathyroidectomy  . PARATHYROIDECTOMY  06/29/11     Lysbeth Penner, FNP 09/22/17 1256

## 2017-09-30 NOTE — Progress Notes (Signed)
This encounter was created in error - please disregard.

## 2017-12-08 ENCOUNTER — Encounter (HOSPITAL_COMMUNITY): Payer: Self-pay | Admitting: Emergency Medicine

## 2017-12-08 ENCOUNTER — Emergency Department (HOSPITAL_COMMUNITY): Payer: Medicare Other

## 2017-12-08 ENCOUNTER — Emergency Department (HOSPITAL_COMMUNITY)
Admission: EM | Admit: 2017-12-08 | Discharge: 2017-12-08 | Disposition: A | Discharge status: Home / Self Care | Payer: Medicare Other | Attending: Emergency Medicine | Admitting: Emergency Medicine

## 2017-12-08 DIAGNOSIS — W1830XA Fall on same level, unspecified, initial encounter: Secondary | ICD-10-CM | POA: Insufficient documentation

## 2017-12-08 DIAGNOSIS — W19XXXA Unspecified fall, initial encounter: Secondary | ICD-10-CM

## 2017-12-08 DIAGNOSIS — Z87891 Personal history of nicotine dependence: Secondary | ICD-10-CM | POA: Diagnosis not present

## 2017-12-08 DIAGNOSIS — Y93E1 Activity, personal bathing and showering: Secondary | ICD-10-CM | POA: Insufficient documentation

## 2017-12-08 DIAGNOSIS — Y92012 Bathroom of single-family (private) house as the place of occurrence of the external cause: Secondary | ICD-10-CM | POA: Insufficient documentation

## 2017-12-08 DIAGNOSIS — Z79899 Other long term (current) drug therapy: Secondary | ICD-10-CM | POA: Insufficient documentation

## 2017-12-08 DIAGNOSIS — Y999 Unspecified external cause status: Secondary | ICD-10-CM | POA: Diagnosis not present

## 2017-12-08 DIAGNOSIS — M545 Low back pain: Secondary | ICD-10-CM | POA: Diagnosis not present

## 2017-12-08 DIAGNOSIS — Z043 Encounter for examination and observation following other accident: Secondary | ICD-10-CM | POA: Diagnosis present

## 2017-12-08 DIAGNOSIS — I10 Essential (primary) hypertension: Secondary | ICD-10-CM | POA: Diagnosis not present

## 2017-12-08 DIAGNOSIS — Z853 Personal history of malignant neoplasm of breast: Secondary | ICD-10-CM | POA: Diagnosis not present

## 2017-12-08 NOTE — Discharge Instructions (Addendum)
As discussed, it is normal to feel worse in the days immediately following a fall regardless of medication use. ° °However, please take all medication as directed, use ice packs liberally.  If you develop any new, or concerning changes in your condition, please return here for further evaluation and management.   ° °Otherwise, please return followup with your physician ° ° ° °

## 2017-12-08 NOTE — ED Notes (Signed)
Patient transported to X-ray 

## 2017-12-08 NOTE — ED Triage Notes (Signed)
Per [facility] pt from home, fell yesterday while getting out of tub. Pt c/o tailbone pain. States she hit her head but not having any pain. Pt is non compliant with medications "I don't trust most doctors" pt has right sided deficits from previous stroke. Pt has CNA who states pt has been calling OBGYN asking for pain medications.

## 2017-12-08 NOTE — ED Provider Notes (Signed)
[facility] DEPT Provider Note   CSN: [id number] Arrival date & time: 12/08/17  1203     History   Chief Complaint Chief Complaint  Patient presents with  . Fall    HPI [name] is a 84 y.o. female.  HPI Patient presents after a fall that occurred yesterday, now with concern for coccyx and pelvis pain. Patient is here with her caregiver who assists with the HPI. She notes that she was in her usual state of health, which includes occasional falls, but doing generally well, when yesterday, while in the bathtub she slipped.  When she slipped and had contact with her posterior pelvis, and minimally to her head. No loss of consciousness, no new weakness in any extremity, and she has been able to bear weight again, though with more difficulty than usual. She has had some relief with Tylenol and Percocet. No other new complaints, including urinary changes, distal loss of sensation or weakness.  Past Medical History:  Diagnosis Date  . Adenomatous colon polyp 04/2005  . Allergic    Anectine  . ANXIETY 10/13/2007  . BACK PAIN 03/01/2007  . BREAST CANCER, HX OF 12/06/2007  . Cancer [facility])    RIGHT mastectomy with node dissection  . Complication of anesthesia    family aggergy to annectine  . Compression fracture of L4 lumbar vertebra 02/2013  . CONSTIPATION 01/06/2009  . DEPRESSION 10/13/2007  . Diverticular disease   . DIVERTICULOSIS, COLON 05/11/2005  . External hemorrhoids without mention of complication 3/53/6144  . FATIGUE 08/07/2008  . HEMORRHOIDS, INTERNAL 05/11/2005  . Hypercalcemia 04/23/2009  . HYPERLIPIDEMIA NEC/NOS 03/01/2007  . HYPERTENSION 10/13/2007  . Impaired fasting glucose 11/14/2008  . INTERSTITIAL CYSTITIS 09/06/2007  . Irritable bowel syndrome 01/06/2009  . LIVER MASS 05/15/2009  . OSTEOPOROSIS 05/09/2009  . PARESTHESIA 09/06/2007  . POLYCYTHEMIA 04/23/2009  . STENOSIS, SPINAL, UNSPC REGION 03/01/2007  . Thyroid disease      Patient Active Problem List   Diagnosis Date Noted  . Cognitive deficit due to recent stroke 03/08/2017  . Alteration of sensation as late effect of stroke 03/08/2017  . Gait disturbance, post-stroke 03/08/2017  . Acute arterial ischemic stroke, vertebrobasilar, thalamic, left ([place]) 01/10/2017  . Neurologic gait disorder   . Depression   . Benign essential HTN   . History of breast cancer   . Stroke (cerebrum) ([place]) 01/08/2017  . TIA (transient ischemic attack) 12/16/2016  . Dysarthria 12/16/2016  . Leg weakness 12/16/2016  . EKG abnormalities 12/16/2016  . Chronic pain 12/16/2016  . Elevated blood pressure 12/22/2014  . Internal hemorrhoids with bleeding and prolapse 11/04/2014  . Lactose intolerance 11/04/2014  . Hemorrhoid 09/11/2014  . Rectal bleeding 09/11/2014  . IBS (irritable bowel syndrome) 09/25/2013  . Hyperparathyroidism, primary ([name]) 05/26/2011  . Osteoporosis 05/09/2009  . POLYCYTHEMIA 04/23/2009  . IMPAIRED FASTING GLUCOSE 11/14/2008  . BREAST CANCER, HX OF 12/06/2007  . Anxiety state 10/13/2007  . Essential hypertension 10/13/2007  . INTERSTITIAL CYSTITIS 09/06/2007  . HLD (hyperlipidemia) 03/01/2007  . Backache 03/01/2007    Past Surgical History:  Procedure Laterality Date  . ABDOMINAL HYSTERECTOMY    . BACK SURGERY    . BREAST SURGERY     mastectomy -right  . CERVICAL SPINE SURGERY    . CHOLECYSTECTOMY    . COLONOSCOPY W/ BIOPSIES    . cortisone injection  10/16/12   left knee  . FIXATION KYPHOPLASTY LUMBAR SPINE    . HEMORRHOID BANDING  2015-16  .  PARATHYROID EXPLORATION  06/29/2011   Procedure: PARATHYROID EXPLORATION;  Surgeon: Earnstine Regal, MD;  Location: [facility] ORS;  Service: General;  Laterality: Left;  Left Superior Parathyroidectomy  . PARATHYROIDECTOMY  06/29/11     OB History   None      Home Medications    Prior to Admission medications   Medication Sig Start Date End Date Taking? Authorizing Provider  buPROPion (WELLBUTRIN  XL) 300 MG 24 hr tablet Take 1 tablet (300 mg total) by mouth daily. 01/18/17  Yes Angiulli, Lavon Paganini, PA-C  carisoprodol (SOMA) 350 MG tablet Take 1 tablet (350 mg total) by mouth daily as needed (muscle spasms). 01/18/17  Yes Angiulli, Lavon Paganini, PA-C  celecoxib (CELEBREX) 200 MG capsule Take 1 capsule (200 mg total) by mouth daily as needed (arthritis pain). 01/18/17  Yes Angiulli, Lavon Paganini, PA-C  Cholecalciferol (VITAMIN D3) 1000 units CAPS Take 1 capsule (1,000 Units total) by mouth daily. 01/18/17  Yes Angiulli, Lavon Paganini, PA-C  fluticasone (FLONASE) 50 MCG/ACT nasal spray Place 2 sprays into both nostrils daily. Patient taking differently: Place 2 sprays into both nostrils daily as needed for allergies.  09/06/14  Yes Hoyt Koch, MD  oxyCODONE-acetaminophen (PERCOCET/ROXICET) 5-325 MG tablet Take 1-2 tablets by mouth every 6 (six) hours as needed for severe pain. 07/16/17  Yes Virgel Manifold, MD  acetaminophen (TYLENOL) 500 MG tablet Take 1,000 mg by mouth every 6 (six) hours as needed for mild pain.    [provider]  Alum & Mag Hydroxide-Simeth (GELUSIL PO) Take 1 tablet by mouth daily as needed (acid reflux).    [provider]  Ascorbic Acid (VITAMIN C PO) Take 1 tablet by mouth daily as needed (when not eating enough fruit).    [provider]  aspirin 81 MG chewable tablet Chew 1 tablet (81 mg total) by mouth daily. Patient not taking: Reported on 12/08/2017 12/18/16   Rosita Fire, MD  CALCIUM-MAGNESIUM PO Take 1 tablet by mouth daily.    [provider]  carisoprodol (SOMA) 250 MG tablet Take 250 mg by mouth 2 (two) times daily.    [provider]  clopidogrel (PLAVIX) 75 MG tablet Take 1 tablet (75 mg total) by mouth daily. Patient not taking: Reported on 12/08/2017 01/19/17   Angiulli, Lavon Paganini, PA-C  Cyanocobalamin (VITAMIN B-12 PO) Take 1 tablet by mouth daily.    [provider]  diazepam (VALIUM) 10 MG tablet Take  10 mg by mouth at bedtime.     [provider]  diclofenac sodium (VOLTAREN) 1 % GEL Apply 2 g topically 4 (four) times daily. Patient not taking: Reported on 12/08/2017 01/18/17   Angiulli, Lavon Paganini, PA-C  estradiol (ESTRACE) 0.1 MG/GM vaginal cream Place 1 Applicatorful vaginally daily as needed (for dryness).     [provider]  hydrOXYzine (VISTARIL) 25 MG capsule Take 1 capsule (25 mg total) by mouth at bedtime. Patient not taking: Reported on 07/16/2017 01/18/17   Angiulli, Lavon Paganini, PA-C  metoprolol tartrate (LOPRESSOR) 25 MG tablet Take 1 tablet (25 mg total) by mouth 2 (two) times daily. 01/18/17   Angiulli, Lavon Paganini, PA-C  Multiple Vitamin (MULTIVITAMIN WITH MINERALS) TABS tablet Take 1 tablet by mouth daily as needed (when not eating well).    [provider]  NONFORMULARY OR COMPOUNDED ITEM Equal parts Nystatin 100,000 units/ml and viscous lidocaine 2%.  Swish 15 ml and spit q 6 hour prn disp 250 ml 0 RF Patient not taking: Reported  on 05/25/2017 01/27/17   Charlett Blake, MD  oxyCODONE-acetaminophen (PERCOCET/ROXICET) 5-325 MG tablet Take 1-2 tablets by mouth every 8 (eight) hours as needed for severe pain. Patient not taking: Reported on 07/16/2017 05/25/17   Davonna Belling, MD  polyethylene glycol [facility] / Floria Raveling) packet Take 17 g by mouth daily. Patient not taking: Reported on 07/16/2017 01/19/17   Angiulli, Lavon Paganini, PA-C  pravastatin (PRAVACHOL) 40 MG tablet Take 1 tablet (40 mg total) by mouth daily at 6 PM. Patient not taking: Reported on 05/25/2017 02/14/17   Angiulli, Lavon Paganini, PA-C  VITAMIN E PO Take 1 capsule by mouth daily.    [provider]  Zoledronic Acid (RECLAST IV) Inject into the vein. Yearly injection - last injection March 2018    [provider]    Family History Family History  Problem Relation Age of Onset  . Heart disease Sister   . Hypertension Sister   . Asthma Sister   . Breast cancer Maternal Aunt   .  Ovarian cancer Paternal Aunt   . Colon cancer Neg Hx   . Esophageal cancer Neg Hx   . Pancreatic cancer Neg Hx   . Rectal cancer Neg Hx   . Stomach cancer Neg Hx     Social History Social History   Tobacco Use  . Smoking status: Former Smoker    Packs/day: 1.00    Years: 42.00    Pack years: 42.00    Types: Cigarettes    Last attempt to quit: 08/23/1977    Years since quitting: 40.3  . Smokeless tobacco: Never Used  Substance Use Topics  . Alcohol use: No  . Drug use: No     Allergies   Anectine [succinylcholine chloride]; Dilaudid [hydromorphone hcl]; Hydrocodone-acetaminophen; Iodine; Naproxen sodium; Oxycodone hcl; Pentosan polysulfate sodium; Pseudoephedrine; Succinylcholine; Tape; Tramadol hcl; and Shellfish allergy   Review of Systems Review of Systems  Constitutional:       Per HPI, otherwise negative  HENT:       Per HPI, otherwise negative  Respiratory:       Per HPI, otherwise negative  Cardiovascular:       Per HPI, otherwise negative  Gastrointestinal: Negative for vomiting.  Endocrine:       Negative aside from HPI  Genitourinary:       Neg aside from HPI   Musculoskeletal:       Per HPI, otherwise negative  Skin: Positive for color change.  Neurological: Negative for syncope.  Hematological: Bruises/bleeds easily.     Physical Exam Updated Vital Signs BP (!) 157/82 (BP Location: Left Arm)   Pulse 98   Temp (!) 97.5 F (36.4 C) (Oral)   Resp 18   Ht 5\' 5"  (1.651 m)   Wt 81.6 kg (180 lb)   SpO2 96%   BMI 29.95 kg/m   Physical Exam  Constitutional: She is oriented to person, place, and time. She appears well-developed and well-nourished. No distress.  HENT:  Head: Normocephalic.    Eyes: Conjunctivae and EOM are normal.  Cardiovascular: Normal rate, regular rhythm, intact distal pulses and normal pulses.  Pulmonary/Chest: Effort normal and breath sounds normal. No stridor. No respiratory distress.  Abdominal: She exhibits no  distension.  Musculoskeletal: She exhibits no edema.       Legs: Neurological: She is alert and oriented to person, place, and time. No cranial nerve deficit.  Skin: Skin is warm and dry.  Psychiatric: She has a normal mood and affect.  Nursing  note and vitals reviewed.    ED Treatments / Results  Labs (all labs ordered are listed, but only abnormal results are displayed) Labs Reviewed - No data to display  EKG None  Radiology Dg Lumbar Spine Complete  Result Date: 12/08/2017 CLINICAL DATA:  Pain following fall EXAM: LUMBAR SPINE - COMPLETE 4+ VIEW COMPARISON:  Lumbar radiographs July 05, 2013 FINDINGS: Frontal, lateral, spot lumbosacral lateral, and bilateral oblique views were obtained. There are 5 non-rib-bearing lumbar type vertebral bodies. T12 ribs are essentially aplastic. The patient has had previous anterior wedge fractures at T12, L2, and L3 with evidence of kyphoplasty procedures at these levels, stable. There is no new or progression of fracture. No spondylolisthesis. There is disc space narrowing at L5-S1, stable. There is also disc space narrowing at L1-2 and L2-3, stable. There is facet osteoarthritic change at L3-4, L4-5, and L5-S1 bilaterally. There is aortoiliac atherosclerosis. IMPRESSION: Previous kyphoplasty procedures for fractures at T12, L2, and L3, stable. No new fracture. No spondylolisthesis. Osteoarthritic changes several levels, essentially stable as well. There is aortoiliac atherosclerosis. Aortic Atherosclerosis (ICD10-I70.0). Electronically Signed   By: Lowella Grip III M.D.   On: 12/08/2017 15:39    Procedures Procedures (including critical care time)  Medications Ordered in ED Medications - No data to display   Initial Impression / Assessment and Plan / ED Course  I have reviewed the triage vital signs and the nursing notes.  Pertinent labs & imaging results that were available during my care of the patient were reviewed by me and  considered in my medical decision making (see chart for details).     3:46 PM Patient awake and alert, in no distress. X-ray reviewed with the patient, and I reviewed myself. No evidence for acute new fracture, though she has substantial old injuries. Given the patient's description of preserved ambulatory status, absence of distress, low suspicion for other pathology, some suspicion for contusion. Patient discharged in stable condition with ongoing analgesia, outpatient follow-up as needed.  Final Clinical Impressions(s) / ED Diagnoses  Fall, initial encounter   Carmin Muskrat, MD 12/08/17 1547

## 2017-12-17 ENCOUNTER — Other Ambulatory Visit: Payer: Self-pay

## 2017-12-17 ENCOUNTER — Emergency Department (HOSPITAL_COMMUNITY): Payer: Medicare Other

## 2017-12-17 ENCOUNTER — Emergency Department (HOSPITAL_COMMUNITY)
Admission: EM | Admit: 2017-12-17 | Discharge: 2017-12-17 | Disposition: A | Discharge status: Home / Self Care | Payer: Medicare Other | Attending: Emergency Medicine | Admitting: Emergency Medicine

## 2017-12-17 DIAGNOSIS — W16212S Fall in (into) filled bathtub causing other injury, sequela: Secondary | ICD-10-CM | POA: Insufficient documentation

## 2017-12-17 DIAGNOSIS — Y9389 Activity, other specified: Secondary | ICD-10-CM | POA: Insufficient documentation

## 2017-12-17 DIAGNOSIS — Z853 Personal history of malignant neoplasm of breast: Secondary | ICD-10-CM | POA: Insufficient documentation

## 2017-12-17 DIAGNOSIS — Z79899 Other long term (current) drug therapy: Secondary | ICD-10-CM | POA: Insufficient documentation

## 2017-12-17 DIAGNOSIS — S34139S Unspecified injury to sacral spinal cord, sequela: Secondary | ICD-10-CM | POA: Diagnosis present

## 2017-12-17 DIAGNOSIS — Y929 Unspecified place or not applicable: Secondary | ICD-10-CM | POA: Insufficient documentation

## 2017-12-17 DIAGNOSIS — Y998 Other external cause status: Secondary | ICD-10-CM | POA: Insufficient documentation

## 2017-12-17 DIAGNOSIS — S3210XS Unspecified fracture of sacrum, sequela: Secondary | ICD-10-CM | POA: Insufficient documentation

## 2017-12-17 DIAGNOSIS — I10 Essential (primary) hypertension: Secondary | ICD-10-CM | POA: Insufficient documentation

## 2017-12-17 DIAGNOSIS — Z87891 Personal history of nicotine dependence: Secondary | ICD-10-CM | POA: Insufficient documentation

## 2017-12-17 DIAGNOSIS — M8448XA Pathological fracture, other site, initial encounter for fracture: Secondary | ICD-10-CM

## 2017-12-17 DIAGNOSIS — E213 Hyperparathyroidism, unspecified: Secondary | ICD-10-CM | POA: Diagnosis not present

## 2017-12-17 LAB — URINALYSIS, ROUTINE W REFLEX MICROSCOPIC
Bilirubin Urine: NEGATIVE
Glucose, UA: NEGATIVE mg/dL
Hgb urine dipstick: NEGATIVE
KETONES UR: NEGATIVE mg/dL
Nitrite: NEGATIVE
PROTEIN: NEGATIVE mg/dL
Specific Gravity, Urine: 1.01 (ref 1.005–1.030)
pH: 6 (ref 5.0–8.0)

## 2017-12-17 MED ORDER — OXYCODONE-ACETAMINOPHEN 5-325 MG PO TABS
1.0000 | ORAL_TABLET | Freq: Once | ORAL | Status: AC
  Administered 2017-12-17: 1 via ORAL
  Filled 2017-12-17: qty 1

## 2017-12-17 NOTE — Progress Notes (Signed)
Consult request for transportation has been received. CSW attempting to follow up at present time.  CSW verified pt's nephew is en route to the [facility] to p/u the pt.  CM became involved for [facility] request and request of the CSW.  EDP placed CM consult.  Please reconsult if future social work needs arise.  CSW signing off, as social work intervention is no longer needed.  Alphonse Guild. Cheronda Erck, LCSW, LCAS, CSI Clinical Social Worker Ph: [phone]

## 2017-12-17 NOTE — ED Triage Notes (Signed)
Per EMS:  Pt reports she is having tailbone pain. Pt reportedly had a fall over a week ago and was seen in the ED to get x-rays. Nothing was reported broken at that time.  Pt reports the doctor told her "the pain will be better in a week" and it has been over a week.  Pt reports it is more towards her tailbone now than it was.  Pt does not believe her tailbone pain is from her recent fall, but she has not had anymore falls since.  EMS reports vitals stable.

## 2017-12-17 NOTE — ED Notes (Signed)
While RN was assisting another pt, Pt [name] had gotten out of bed unassisted and say on the stool. MD found patient sitting on stool and told RN.  RN and MD assisted patient to chair because pt reported she "could not lay in bed because it hurt her tailbone too much." Pt given her call bell and instructed to push the red button if she needed anything.  Pt reports she will ring call bell if needed.  RN reported to pt she would be back with pain medicine once the doctor put the order in.

## 2017-12-17 NOTE — ED Notes (Signed)
Social worker assisted pt to charge her phone and call other relatives. Pt reports she has a ride coming to get her now.  Case worker is also coming to see pt.

## 2017-12-17 NOTE — ED Notes (Addendum)
Pt reports she has no way to get home. Pt is anxious and jittery and has attempted to call multiple people to help her get home. Everyone she has attempted to call is unable to help her home. Pt lives alone, so if she were to be transported by [facility], no one would be there to receive her.  Pt reports she does not have enough money for a cab and she is very anxious as her phone is not well charged.  RN has no charger to provide pt to charge phone.  Social work called for assistance for pt. Pt is in a gown and RN would feel uncomfortable with her riding the bus.

## 2017-12-17 NOTE — Care Management Note (Signed)
Case Management Note  Patient Details  Name: [name] MRN: [id number] Date of Birth: [birth year]  Subjective/Objective:   Fall, pubic fracture                 Action/Plan: NCM spoke to pt and offered choice for HH/list provided. States she had [facility] in the past. Contacted [facility] with new referral. States she had [facility] in the past.  States she lives alone. Has medical alert system. Nephew at bedside and states he will assist as often as needed.   Expected Discharge Date:                 Expected Discharge Plan:  [place]  In-House Referral:  Clinical Social Work  Discharge planning Services  CM Consult  Post Acute Care Choice:  Home Health Choice offered to:  Patient  DME Arranged:  N/A DME Agency:  NA  HH Arranged:  PT, Social Work, Therapist, sports, OT [place] Agency:  [place]  Status of Service:  Completed, signed off  If discussed at [employer], dates discussed:    Additional Comments:  Erenest Rasher, RN 12/17/2017, 4:24 PM

## 2017-12-17 NOTE — ED Notes (Signed)
Bed: WA03 Expected date: 12/17/17 Expected time: 11:25 AM Means of arrival: Ambulance Comments: Fall 2 weeks ago, back pain

## 2017-12-17 NOTE — ED Notes (Signed)
RN attempted to call pt's emergency contact. Pt's friend reports that she is at work and cannot get off work to take pt home.

## 2017-12-17 NOTE — ED Provider Notes (Addendum)
[place] DEPT Provider Note   CSN: [id number] Arrival date & time: 12/17/17  1123     History   Chief Complaint Chief Complaint  Patient presents with  . Tailbone Pain    HPI [name] is a 84 y.o. female.  Pt presents to the ED today with tailbone pain.  Pt said she fell in the tub on 4/18 and was seen in the ED.  She had xrays done which were negative.  She has been doing well until today when the pain started.  She said it is in a slightly different place that before.  No new falls.     Past Medical History:  Diagnosis Date  . Adenomatous colon polyp 04/2005  . Allergic    Anectine  . ANXIETY 10/13/2007  . BACK PAIN 03/01/2007  . BREAST CANCER, HX OF 12/06/2007  . Cancer [facility])    RIGHT mastectomy with node dissection  . Complication of anesthesia    family aggergy to annectine  . Compression fracture of L4 lumbar vertebra 02/2013  . CONSTIPATION 01/06/2009  . DEPRESSION 10/13/2007  . Diverticular disease   . DIVERTICULOSIS, COLON 05/11/2005  . External hemorrhoids without mention of complication 0/17/7939  . FATIGUE 08/07/2008  . HEMORRHOIDS, INTERNAL 05/11/2005  . Hypercalcemia 04/23/2009  . HYPERLIPIDEMIA NEC/NOS 03/01/2007  . HYPERTENSION 10/13/2007  . Impaired fasting glucose 11/14/2008  . INTERSTITIAL CYSTITIS 09/06/2007  . Irritable bowel syndrome 01/06/2009  . LIVER MASS 05/15/2009  . OSTEOPOROSIS 05/09/2009  . PARESTHESIA 09/06/2007  . POLYCYTHEMIA 04/23/2009  . STENOSIS, SPINAL, UNSPC REGION 03/01/2007  . Thyroid disease     Patient Active Problem List   Diagnosis Date Noted  . Cognitive deficit due to recent stroke 03/08/2017  . Alteration of sensation as late effect of stroke 03/08/2017  . Gait disturbance, post-stroke 03/08/2017  . Acute arterial ischemic stroke, vertebrobasilar, thalamic, left ([place]) 01/10/2017  . Neurologic gait disorder   . Depression   . Benign essential HTN   . History of breast cancer   .  Stroke (cerebrum) ([place]) 01/08/2017  . TIA (transient ischemic attack) 12/16/2016  . Dysarthria 12/16/2016  . Leg weakness 12/16/2016  . EKG abnormalities 12/16/2016  . Chronic pain 12/16/2016  . Elevated blood pressure 12/22/2014  . Internal hemorrhoids with bleeding and prolapse 11/04/2014  . Lactose intolerance 11/04/2014  . Hemorrhoid 09/11/2014  . Rectal bleeding 09/11/2014  . IBS (irritable bowel syndrome) 09/25/2013  . Hyperparathyroidism, primary ([place]) 05/26/2011  . Osteoporosis 05/09/2009  . POLYCYTHEMIA 04/23/2009  . IMPAIRED FASTING GLUCOSE 11/14/2008  . BREAST CANCER, HX OF 12/06/2007  . Anxiety state 10/13/2007  . Essential hypertension 10/13/2007  . INTERSTITIAL CYSTITIS 09/06/2007  . HLD (hyperlipidemia) 03/01/2007  . Backache 03/01/2007    Past Surgical History:  Procedure Laterality Date  . ABDOMINAL HYSTERECTOMY    . BACK SURGERY    . BREAST SURGERY     mastectomy -right  . CERVICAL SPINE SURGERY    . CHOLECYSTECTOMY    . COLONOSCOPY W/ BIOPSIES    . cortisone injection  10/16/12   left knee  . FIXATION KYPHOPLASTY LUMBAR SPINE    . HEMORRHOID BANDING  2015-16  . PARATHYROID EXPLORATION  06/29/2011   Procedure: PARATHYROID EXPLORATION;  Surgeon: Earnstine Regal, MD;  Location: [facility] ORS;  Service: General;  Laterality: Left;  Left Superior Parathyroidectomy  . PARATHYROIDECTOMY  06/29/11     OB History   None      Home Medications  Prior to Admission medications   Medication Sig Start Date End Date Taking? Authorizing Provider  buPROPion (WELLBUTRIN XL) 300 MG 24 hr tablet Take 1 tablet (300 mg total) by mouth daily. 01/18/17  Yes Angiulli, Lavon Paganini, PA-C  diazepam (VALIUM) 10 MG tablet Take 10 mg by mouth 2 (two) times daily as needed for anxiety.    Yes [provider]  oxyCODONE-acetaminophen (PERCOCET/ROXICET) 5-325 MG tablet Take 1-2 tablets by mouth every 6 (six) hours as needed for severe pain. Patient taking differently: Take 1 tablet  by mouth at bedtime as needed for moderate pain or severe pain.  07/16/17  Yes Virgel Manifold, MD  acetaminophen (TYLENOL) 500 MG tablet Take 1,000 mg by mouth every 6 (six) hours as needed for mild pain.    [provider]  Alum & Mag Hydroxide-Simeth (GELUSIL PO) Take 1 tablet by mouth daily as needed (acid reflux).    [provider]  Ascorbic Acid (VITAMIN C PO) Take 1 tablet by mouth daily as needed (when not eating enough fruit).    [provider]  aspirin 81 MG chewable tablet Chew 1 tablet (81 mg total) by mouth daily. Patient not taking: Reported on 12/08/2017 12/18/16   Rosita Fire, MD  CALCIUM-MAGNESIUM PO Take 1 tablet by mouth daily.    [provider]  carisoprodol (SOMA) 250 MG tablet Take 250 mg by mouth 2 (two) times daily.    [provider]  carisoprodol (SOMA) 350 MG tablet Take 1 tablet (350 mg total) by mouth daily as needed (muscle spasms). 01/18/17   Angiulli, Lavon Paganini, PA-C  celecoxib (CELEBREX) 200 MG capsule Take 1 capsule (200 mg total) by mouth daily as needed (arthritis pain). 01/18/17   Angiulli, Lavon Paganini, PA-C  Cholecalciferol (VITAMIN D3) 1000 units CAPS Take 1 capsule (1,000 Units total) by mouth daily. 01/18/17   Angiulli, Lavon Paganini, PA-C  clopidogrel (PLAVIX) 75 MG tablet Take 1 tablet (75 mg total) by mouth daily. Patient not taking: Reported on 12/08/2017 01/19/17   Angiulli, Lavon Paganini, PA-C  Cyanocobalamin (VITAMIN B-12 PO) Take 1 tablet by mouth daily.    [provider]  diclofenac sodium (VOLTAREN) 1 % GEL Apply 2 g topically 4 (four) times daily. Patient not taking: Reported on 12/08/2017 01/18/17   Angiulli, Lavon Paganini, PA-C  estradiol (ESTRACE) 0.1 MG/GM vaginal cream Place 1 Applicatorful vaginally daily as needed (for dryness).     [provider]  fluticasone (FLONASE) 50 MCG/ACT nasal spray Place 2 sprays into both nostrils daily. Patient taking differently: Place 2 sprays into both  nostrils daily as needed for allergies.  09/06/14   Hoyt Koch, MD  hydrOXYzine (VISTARIL) 25 MG capsule Take 1 capsule (25 mg total) by mouth at bedtime. Patient not taking: Reported on 07/16/2017 01/18/17   Angiulli, Lavon Paganini, PA-C  metoprolol tartrate (LOPRESSOR) 25 MG tablet Take 1 tablet (25 mg total) by mouth 2 (two) times daily. 01/18/17   Angiulli, Lavon Paganini, PA-C  Multiple Vitamin (MULTIVITAMIN WITH MINERALS) TABS tablet Take 1 tablet by mouth daily as needed (when not eating well).    [provider]  NONFORMULARY OR COMPOUNDED ITEM Equal parts Nystatin 100,000 units/ml and viscous lidocaine 2%.  Swish 15 ml and spit q 6 hour prn disp 250 ml 0 RF Patient not taking: Reported on 05/25/2017 01/27/17   Charlett Blake, MD  oxyCODONE-acetaminophen (PERCOCET/ROXICET) 5-325 MG tablet Take 1-2 tablets by mouth every 8 (eight) hours as needed for severe  pain. Patient not taking: Reported on 07/16/2017 05/25/17   Davonna Belling, MD  polyethylene glycol [facility] / Floria Raveling) packet Take 17 g by mouth daily. Patient not taking: Reported on 07/16/2017 01/19/17   Angiulli, Lavon Paganini, PA-C  pravastatin (PRAVACHOL) 40 MG tablet Take 1 tablet (40 mg total) by mouth daily at 6 PM. Patient not taking: Reported on 05/25/2017 02/14/17   Angiulli, Lavon Paganini, PA-C  VITAMIN E PO Take 1 capsule by mouth daily.    [provider]  Zoledronic Acid (RECLAST IV) Inject into the vein. Yearly injection - last injection March 2018    [provider]    Family History Family History  Problem Relation Age of Onset  . Heart disease Sister   . Hypertension Sister   . Asthma Sister   . Breast cancer Maternal Aunt   . Ovarian cancer Paternal Aunt   . Colon cancer Neg Hx   . Esophageal cancer Neg Hx   . Pancreatic cancer Neg Hx   . Rectal cancer Neg Hx   . Stomach cancer Neg Hx     Social History Social History   Tobacco Use  . Smoking status: Former Smoker    Packs/day: 1.00      Years: 42.00    Pack years: 42.00    Types: Cigarettes    Last attempt to quit: 08/23/1977    Years since quitting: 40.3  . Smokeless tobacco: Never Used  Substance Use Topics  . Alcohol use: No  . Drug use: No     Allergies   Anectine [succinylcholine chloride]; Dilaudid [hydromorphone hcl]; Hydrocodone-acetaminophen; Iodine; Naproxen sodium; Oxycodone hcl; Pentosan polysulfate sodium; Pseudoephedrine; Succinylcholine; Tape; Tramadol hcl; and Shellfish allergy   Review of Systems Review of Systems  Musculoskeletal:       Tailbone pain  All other systems reviewed and are negative.    Physical Exam Updated Vital Signs BP (!) 169/113 (BP Location: Left Arm)   Pulse (!) 123   Temp 98 F (36.7 C) (Oral)   Resp 18   Ht 5\' 5"  (1.651 m)   Wt 84.4 kg (186 lb)   SpO2 96%   BMI 30.95 kg/m   Physical Exam  Constitutional: She is oriented to person, place, and time. She appears well-developed and well-nourished.  HENT:  Head: Normocephalic and atraumatic.  Right Ear: External ear normal.  Left Ear: External ear normal.  Nose: Nose normal.  Mouth/Throat: Oropharynx is clear and moist.  Eyes: Pupils are equal, round, and reactive to light. Conjunctivae and EOM are normal.  Neck: Normal range of motion. Neck supple.  Cardiovascular: Normal rate, regular rhythm, normal heart sounds and intact distal pulses.  Pulmonary/Chest: Effort normal and breath sounds normal.  Abdominal: Soft. Bowel sounds are normal.  Musculoskeletal:       Back:  Tenderness in sacrum/coccyx  Neurological: She is alert and oriented to person, place, and time.  Skin: Skin is warm. Capillary refill takes less than 2 seconds.  Psychiatric: She has a normal mood and affect. Her behavior is normal. Judgment and thought content normal.  Nursing note and vitals reviewed.    ED Treatments / Results  Labs (all labs ordered are listed, but only abnormal results are displayed) Labs Reviewed  URINALYSIS,  ROUTINE W REFLEX MICROSCOPIC - Abnormal; Notable for the following components:      Result Value   Leukocytes, UA MODERATE (*)    Bacteria, UA RARE (*)    All other components within normal limits  EKG None  Radiology Dg Pelvis 1-2 Views  Result Date: 12/17/2017 CLINICAL DATA:  Pelvic pain.  Fall 3 weeks ago.  Initial encounter. EXAM: PELVIS - 1-2 VIEW COMPARISON:  None. FINDINGS: There is no evidence of pelvic fracture or diastasis. Old fracture deformity of right inferior pubic ramus. Iliac artery atherosclerotic calcification. IMPRESSION: No acute findings. Electronically Signed   By: Earle Gell M.D.   On: 12/17/2017 13:12   Dg Sacrum/coccyx  Result Date: 12/17/2017 CLINICAL DATA:  Patient woke with pain in the sacrum and coccyx. No recent fall. EXAM: SACRUM AND COCCYX - 2+ VIEW COMPARISON:  CT of the right hip 02/19/2013 FINDINGS: No sacroiliac diastasis. Arcuate lines are intact-no visible sacral fracture. Remote right inferior pubic ramus fracture Osteopenia and atherosclerosis. IMPRESSION: No acute finding. Electronically Signed   By: Monte Fantasia M.D.   On: 12/17/2017 13:13   Ct Pelvis Wo Contrast  Result Date: 12/17/2017 CLINICAL DATA:  Pelvic fracture, known or suspected. Tailbone pain EXAM: CT PELVIS WITHOUT CONTRAST TECHNIQUE: Multidetector CT imaging of the pelvis was performed following the standard protocol without intravenous contrast. COMPARISON:  Pelvic radiography from earlier today FINDINGS: Urinary Tract:  Negative Bowel:  Extensive sigmoid diverticulosis.  Appendectomy. Vascular/Lymphatic: Extensive atherosclerotic calcification. Reproductive:  Hysterectomy.  Pelvic floor laxity Other:  No visible ascites or pneumoperitoneum. Musculoskeletal: Transverse lower sacral insufficiency fracture, at the level of the sacral hiatus and seen between the third and fourth anterior sacral foramina. No displacement. Both hips are located.  Remote L3 vertebroplasty. Remote and  healed right inferior pubic ramus fracture. IMPRESSION: 1. Transverse low sacral insufficiency fracture without displacement. 2. Chronic findings are noted above. Electronically Signed   By: Monte Fantasia M.D.   On: 12/17/2017 14:33    Procedures Procedures (including critical care time)  Medications Ordered in ED Medications  oxyCODONE-acetaminophen (PERCOCET/ROXICET) 5-325 MG per tablet 1 tablet (1 tablet Oral Given 12/17/17 1251)     Initial Impression / Assessment and Plan / ED Course  I have reviewed the triage vital signs and the nursing notes.  Pertinent labs & imaging results that were available during my care of the patient were reviewed by me and considered in my medical decision making (see chart for details).    Fracture probably happened with initial fall, although this is unclear.  Pt's pain has improved after percocet.  She is able to ambulate with assistance (walks with a walker).  She has percocet at home.  She has meds to help with the constipation at home.  She knows to return if worse.  I did consult SW and CM to help set pt up for home health.  I also ordered a rolling walker with a seat.  Final Clinical Impressions(s) / ED Diagnoses   Final diagnoses:  Sacral insufficiency fracture, initial encounter    ED Discharge Orders    None       Isla Pence, MD 12/17/17 1455    Isla Pence, MD 12/17/17 [phone]

## 2018-01-05 ENCOUNTER — Ambulatory Visit (INDEPENDENT_AMBULATORY_CARE_PROVIDER_SITE_OTHER): Payer: Medicare Other | Admitting: Neurology

## 2018-01-05 ENCOUNTER — Encounter: Payer: Self-pay | Admitting: Neurology

## 2018-01-05 VITALS — BP 138/72 | HR 97 | Ht 63.0 in | Wt 154.0 lb

## 2018-01-05 DIAGNOSIS — I63332 Cerebral infarction due to thrombosis of left posterior cerebral artery: Secondary | ICD-10-CM | POA: Diagnosis not present

## 2018-01-05 MED ORDER — GABAPENTIN 100 MG PO CAPS: 100.0000 mg | ORAL_CAPSULE | Freq: Three times a day (TID) | ORAL | 2 refills | Status: DC

## 2018-01-05 NOTE — Progress Notes (Signed)
NEUROLOGY FOLLOW UP OFFICE NOTE  [name] [id number]  HISTORY OF PRESENT ILLNESS: [name] is an 84 year old woman with PTSD, depression, chronic subjective dizziness, chronic pain, IBS, MGUS, osteoporosis and history of breast cancer who follows up for stroke.     UPDATE: She was on dual antiplatelet therapy until August.  She is currently on ASA 81mg  daily.  Plavix made her bruise.  She says she is no longer on Pravachol 40mg  but not sure why.  She is feeling well.  She still has some tingling around the right side of her nose and mouth.  She notes discomfort involving the right thumb which bothers her.   HISTORY: She was admitted to [facility] from 12/16/16 to 12/17/16 after presenting slurred speech and weakness.  She woke up that morning feeling weak, with difficulty getting herself out of bed and with trouble walking.  Later at physical therapy, it was noted that she had slurred speech.  In the ED, her systolic blood pressure was in the 200s.  CT and MRI of head revealed chronic small vessel disease but no acute infarct.  MRA of head revealed atherosclerotic stenosis of the left MCA M2 branches, right PCA P2 segment and left PCA P1 and P2 segments but no emergent large vessel occlusion.  MRA of neck revealed no hemodynamically significant stenosis.  2D echo revealed LV EF 65-70% with no cardiac source of emboli.  LDL was 109.  Hgb A1c was 6.4.  She was started on ASA 81mg  and pravastatin 40mg .   She was readmitted from 01/07/17 to 01/10/17 for onset of right sided arm and leg weakness and right sided facial, arm and leg numbness with slurred speech.  CT head demonstrated no acute findings but MRI of brain showed acute left thalamic ischemic stroke, close proximity to the posterior limb of the left internal capsule.  Due to intracranial stenosis, she was started on dual antiplatelet therapy with ASA and Plavix for 3 months.  She returned to the ED on 02/07/17 for 4  days on increased intermittent right sided tingling of her face and hand.   She has started to regain feeling in her right upper extremity.    Past medication:  Plavix (caused bruising)  PAST MEDICAL HISTORY: Past Medical History:  Diagnosis Date  . Adenomatous colon polyp 04/2005  . Allergic    Anectine  . ANXIETY 10/13/2007  . BACK PAIN 03/01/2007  . BREAST CANCER, HX OF 12/06/2007  . Cancer [facility])    RIGHT mastectomy with node dissection  . Complication of anesthesia    family aggergy to annectine  . Compression fracture of L4 lumbar vertebra 02/2013  . CONSTIPATION 01/06/2009  . DEPRESSION 10/13/2007  . Diverticular disease   . DIVERTICULOSIS, COLON 05/11/2005  . External hemorrhoids without mention of complication 3/55/7322  . FATIGUE 08/07/2008  . HEMORRHOIDS, INTERNAL 05/11/2005  . Hypercalcemia 04/23/2009  . HYPERLIPIDEMIA NEC/NOS 03/01/2007  . HYPERTENSION 10/13/2007  . Impaired fasting glucose 11/14/2008  . INTERSTITIAL CYSTITIS 09/06/2007  . Irritable bowel syndrome 01/06/2009  . LIVER MASS 05/15/2009  . OSTEOPOROSIS 05/09/2009  . PARESTHESIA 09/06/2007  . POLYCYTHEMIA 04/23/2009  . STENOSIS, SPINAL, UNSPC REGION 03/01/2007  . Thyroid disease     MEDICATIONS: Current Outpatient Medications on File Prior to Visit  Medication Sig Dispense Refill  . acetaminophen (TYLENOL) 500 MG tablet Take 1,000 mg by mouth every 6 (six) hours as needed for mild pain.    Marland Kitchen Alum & Mag  Hydroxide-Simeth (GELUSIL PO) Take 1 tablet by mouth daily as needed (acid reflux).    . Ascorbic Acid (VITAMIN C PO) Take 1 tablet by mouth daily as needed (when not eating enough fruit).    Marland Kitchen aspirin 81 MG chewable tablet Chew 1 tablet (81 mg total) by mouth daily. (Patient not taking: Reported on 12/08/2017) 30 tablet 0  . buPROPion (WELLBUTRIN XL) 300 MG 24 hr tablet Take 1 tablet (300 mg total) by mouth daily. 30 tablet 1  . CALCIUM-MAGNESIUM PO Take 1 tablet by mouth daily.    . carisoprodol (SOMA) 250 MG tablet  Take 250 mg by mouth 2 (two) times daily.    . carisoprodol (SOMA) 350 MG tablet Take 1 tablet (350 mg total) by mouth daily as needed (muscle spasms). 30 tablet 0  . celecoxib (CELEBREX) 200 MG capsule Take 1 capsule (200 mg total) by mouth daily as needed (arthritis pain). 30 capsule 0  . Cholecalciferol (VITAMIN D3) 1000 units CAPS Take 1 capsule (1,000 Units total) by mouth daily. 60 capsule 0  . clopidogrel (PLAVIX) 75 MG tablet Take 1 tablet (75 mg total) by mouth daily. (Patient not taking: Reported on 12/08/2017) 30 tablet 1  . Cyanocobalamin (VITAMIN B-12 PO) Take 1 tablet by mouth daily.    . diazepam (VALIUM) 10 MG tablet Take 10 mg by mouth 2 (two) times daily as needed for anxiety.     . diclofenac sodium (VOLTAREN) 1 % GEL Apply 2 g topically 4 (four) times daily. (Patient not taking: Reported on 12/08/2017) 1 Tube 0  . estradiol (ESTRACE) 0.1 MG/GM vaginal cream Place 1 Applicatorful vaginally daily as needed (for dryness).     . fluticasone (FLONASE) 50 MCG/ACT nasal spray Place 2 sprays into both nostrils daily. (Patient taking differently: Place 2 sprays into both nostrils daily as needed for allergies. ) 16 g 2  . hydrOXYzine (VISTARIL) 25 MG capsule Take 1 capsule (25 mg total) by mouth at bedtime. (Patient not taking: Reported on 07/16/2017) 30 capsule 0  . metoprolol tartrate (LOPRESSOR) 25 MG tablet Take 1 tablet (25 mg total) by mouth 2 (two) times daily. (Patient not taking: Reported on 01/05/2018) 60 tablet 1  . Multiple Vitamin (MULTIVITAMIN WITH MINERALS) TABS tablet Take 1 tablet by mouth daily as needed (when not eating well).    . NONFORMULARY OR COMPOUNDED ITEM Equal parts Nystatin 100,000 units/ml and viscous lidocaine 2%.  Swish 15 ml and spit q 6 hour prn disp 250 ml 0 RF (Patient not taking: Reported on 05/25/2017) 250 each 0  . oxyCODONE-acetaminophen (PERCOCET/ROXICET) 5-325 MG tablet Take 1-2 tablets by mouth every 8 (eight) hours as needed for severe pain. (Patient  not taking: Reported on 07/16/2017) 8 tablet 0  . oxyCODONE-acetaminophen (PERCOCET/ROXICET) 5-325 MG tablet Take 1-2 tablets by mouth every 6 (six) hours as needed for severe pain. (Patient taking differently: Take 1 tablet by mouth at bedtime as needed for moderate pain or severe pain. ) 20 tablet 0  . polyethylene glycol (MIRALAX / GLYCOLAX) packet Take 17 g by mouth daily. (Patient not taking: Reported on 07/16/2017) 14 each 0  . pravastatin (PRAVACHOL) 40 MG tablet Take 1 tablet (40 mg total) by mouth daily at 6 PM. (Patient not taking: Reported on 05/25/2017) 30 tablet 0  . Red Yeast Rice Extract 600 MG CAPS Take 1 capsule by mouth daily.    Marland Kitchen VITAMIN E PO Take 1 capsule by mouth daily.    . Zoledronic Acid (RECLAST IV) Inject  into the vein. Yearly injection - last injection March 2018     No current facility-administered medications on file prior to visit.     ALLERGIES: Allergies  Allergen Reactions  . Anectine [Succinylcholine Chloride] Other (See Comments)    Reaction:  Patient states that her body wasn't functioning.  Pt states she was put on life support.   . Dilaudid [Hydromorphone Hcl] Other (See Comments)    Slept for 3 days and 3 nights  . Hydrocodone-Acetaminophen Other (See Comments)    Hallucinations, paranoia, amnesia Pt takes Tylenol at home  . Iodine Hives and Other (See Comments)    , asthma  . Naproxen Sodium Other (See Comments)    REACTION: Asthma  . Oxycodone Hcl Other (See Comments)     Hallucinations, paranoia, amnesia Pt takes Percocet at home with no issues  . Pentosan Polysulfate Sodium Other (See Comments)    REACTION: unspecified per patient  . Pseudoephedrine Other (See Comments)     Palpitations  . Succinylcholine     REACTION: "Can't wake up".  . Tape Dermatitis    Red inflammed  . Tramadol Hcl Other (See Comments)    Unknown reaction  . Shellfish Allergy     Runny nose, cough, hoarseness    FAMILY HISTORY: Family History  Problem  Relation Age of Onset  . Heart disease Sister   . Hypertension Sister   . Asthma Sister   . Breast cancer Maternal Aunt   . Ovarian cancer Paternal Aunt   . Colon cancer Neg Hx   . Esophageal cancer Neg Hx   . Pancreatic cancer Neg Hx   . Rectal cancer Neg Hx   . Stomach cancer Neg Hx     SOCIAL HISTORY: Social History   Socioeconomic History  . Marital status: Legally Separated    Spouse name: Not on file  . Number of children: 0  . Years of education: Not on file  . Highest education level: Not on file  Occupational History  . Not on file  Social Needs  . Financial resource strain: Not on file  . Food insecurity:    Worry: Not on file    Inability: Not on file  . Transportation needs:    Medical: Not on file    Non-medical: Not on file  Tobacco Use  . Smoking status: Former Smoker    Packs/day: 1.00    Years: 42.00    Pack years: 42.00    Types: Cigarettes    Last attempt to quit: 08/23/1977    Years since quitting: 40.3  . Smokeless tobacco: Never Used  Substance and Sexual Activity  . Alcohol use: No  . Drug use: No  . Sexual activity: Not on file  Lifestyle  . Physical activity:    Days per week: Not on file    Minutes per session: Not on file  . Stress: Not on file  Relationships  . Social connections:    Talks on phone: Not on file    Gets together: Not on file    Attends religious service: Not on file    Active member of club or organization: Not on file    Attends meetings of clubs or organizations: Not on file    Relationship status: Not on file  . Intimate partner violence:    Fear of current or ex partner: Not on file    Emotionally abused: Not on file    Physically abused: Not on file    Forced sexual  activity: Not on file  Other Topics Concern  . Not on file  Social History Narrative  . Not on file    REVIEW OF SYSTEMS: Constitutional: No fevers, chills, or sweats, no generalized fatigue, change in appetite Eyes: No visual changes,  double vision, eye pain Ear, nose and throat: No hearing loss, ear pain, nasal congestion, sore throat Cardiovascular: No chest pain, palpitations Respiratory:  No shortness of breath at rest or with exertion, wheezes GastrointestinaI: No nausea, vomiting, diarrhea, abdominal pain, fecal incontinence Genitourinary:  No dysuria, urinary retention or frequency Musculoskeletal:  No neck pain, back pain Integumentary: No rash, pruritus, skin lesions Neurological: as above Psychiatric: No depression, insomnia, anxiety Endocrine: No palpitations, fatigue, diaphoresis, mood swings, change in appetite, change in weight, increased thirst Hematologic/Lymphatic:  No purpura, petechiae. Allergic/Immunologic: no itchy/runny eyes, nasal congestion, recent allergic reactions, rashes  PHYSICAL EXAM: Vitals:   01/05/18 1151  BP: 138/72  Pulse: 97  SpO2: 95%   General: No acute distress.  Patient appears well-groomed.   Head:  Normocephalic/atraumatic Eyes:  Fundi examined but not visualized Neck: supple, no paraspinal tenderness, full range of motion Heart:  Regular rate and rhythm Lungs:  Clear to auscultation bilaterally Back: No paraspinal tenderness Neurological Exam: alert and oriented to person, place, and time. Attention span and concentration intact, recent and remote memory intact, fund of knowledge intact.  Speech fluent and not dysarthric, language intact.  CN II-XII intact. Bulk and tone normal, muscle strength 5/5 throughout.  Sensation to pinprick and vibration intact.  Deep tendon reflexes 2+ throughout.  Finger to nose testing intact.  Gait normal, Romberg negative.   IMPRESSION: Left thalamic stroke secondary to small vessel disease  PLAN: 1.  Continue ASA 81mg  daily 2.  Continue blood pressure control 3.  Recommend for PCP to restart a statin with LDL goal less than 70.   4.  To address possible neuralgia in right thumb, she will try gabapentin 100mg  at bedtime.  If effective,  further refills and dose increase may be managed by PCP. 5.  Follow up as needed.  18 minutes spent face to face with patient, over 50% spent discussing management.  Metta Clines, DO  CC:  Dr. Delfina Redwood

## 2018-01-05 NOTE — Patient Instructions (Signed)
1.  Continue aspirin 81mg  daily 2.  You should really be on a cholesterol medication.  I will make that recommendation to you PCP 3.  For the thumb pain, try gabapentin 100mg  at bedtime.  If effective, your PCP can refill or increase dose as needed. 4.  Follow up as needed.

## 2018-04-28 ENCOUNTER — Ambulatory Visit
Admission: RE | Admit: 2018-04-28 | Discharge: 2018-04-28 | Disposition: A | Discharge status: Home / Self Care | Payer: Medicare Other | Source: Ambulatory Visit | Attending: Internal Medicine | Admitting: Internal Medicine

## 2018-04-28 ENCOUNTER — Other Ambulatory Visit: Payer: Self-pay | Admitting: Internal Medicine

## 2018-04-28 DIAGNOSIS — R0789 Other chest pain: Secondary | ICD-10-CM

## 2018-08-23 DIAGNOSIS — F0390 Unspecified dementia without behavioral disturbance: Secondary | ICD-10-CM

## 2018-08-23 HISTORY — DX: Unspecified dementia, unspecified severity, without behavioral disturbance, psychotic disturbance, mood disturbance, and anxiety: F03.90

## 2019-06-27 ENCOUNTER — Emergency Department (HOSPITAL_COMMUNITY): Payer: Medicare Other

## 2019-06-27 ENCOUNTER — Emergency Department (HOSPITAL_COMMUNITY)
Admission: EM | Admit: 2019-06-27 | Discharge: 2019-06-27 | Disposition: A | Discharge status: Home / Self Care | Payer: Medicare Other | Attending: Emergency Medicine | Admitting: Emergency Medicine

## 2019-06-27 DIAGNOSIS — Y939 Activity, unspecified: Secondary | ICD-10-CM | POA: Insufficient documentation

## 2019-06-27 DIAGNOSIS — Y929 Unspecified place or not applicable: Secondary | ICD-10-CM | POA: Insufficient documentation

## 2019-06-27 DIAGNOSIS — Z87891 Personal history of nicotine dependence: Secondary | ICD-10-CM | POA: Diagnosis not present

## 2019-06-27 DIAGNOSIS — I10 Essential (primary) hypertension: Secondary | ICD-10-CM | POA: Insufficient documentation

## 2019-06-27 DIAGNOSIS — Z853 Personal history of malignant neoplasm of breast: Secondary | ICD-10-CM | POA: Insufficient documentation

## 2019-06-27 DIAGNOSIS — S22080A Wedge compression fracture of T11-T12 vertebra, initial encounter for closed fracture: Secondary | ICD-10-CM | POA: Insufficient documentation

## 2019-06-27 DIAGNOSIS — Y998 Other external cause status: Secondary | ICD-10-CM | POA: Diagnosis not present

## 2019-06-27 DIAGNOSIS — Z79899 Other long term (current) drug therapy: Secondary | ICD-10-CM | POA: Diagnosis not present

## 2019-06-27 DIAGNOSIS — Z8673 Personal history of transient ischemic attack (TIA), and cerebral infarction without residual deficits: Secondary | ICD-10-CM | POA: Insufficient documentation

## 2019-06-27 DIAGNOSIS — W19XXXA Unspecified fall, initial encounter: Secondary | ICD-10-CM | POA: Diagnosis not present

## 2019-06-27 DIAGNOSIS — S29002A Unspecified injury of muscle and tendon of back wall of thorax, initial encounter: Secondary | ICD-10-CM | POA: Diagnosis present

## 2019-06-27 LAB — CBC WITH DIFFERENTIAL/PLATELET
Abs Immature Granulocytes: 0.03 10*3/uL (ref 0.00–0.07)
Basophils Absolute: 0 10*3/uL (ref 0.0–0.1)
Basophils Relative: 0 %
Eosinophils Absolute: 0.1 10*3/uL (ref 0.0–0.5)
Eosinophils Relative: 1 %
HCT: 46.1 % — ABNORMAL HIGH (ref 36.0–46.0)
Hemoglobin: 14.9 g/dL (ref 12.0–15.0)
Immature Granulocytes: 0 %
Lymphocytes Relative: 24 %
Lymphs Abs: 2.3 10*3/uL (ref 0.7–4.0)
MCH: 31 pg (ref 26.0–34.0)
MCHC: 32.3 g/dL (ref 30.0–36.0)
MCV: 96 fL (ref 80.0–100.0)
Monocytes Absolute: 0.5 10*3/uL (ref 0.1–1.0)
Monocytes Relative: 5 %
Neutro Abs: 6.4 10*3/uL (ref 1.7–7.7)
Neutrophils Relative %: 70 %
Platelets: 180 10*3/uL (ref 150–400)
RBC: 4.8 MIL/uL (ref 3.87–5.11)
RDW: 11.9 % (ref 11.5–15.5)
WBC: 9.3 10*3/uL (ref 4.0–10.5)
nRBC: 0 % (ref 0.0–0.2)

## 2019-06-27 LAB — BASIC METABOLIC PANEL
Anion gap: 7 (ref 5–15)
BUN: 24 mg/dL — ABNORMAL HIGH (ref 8–23)
CO2: 28 mmol/L (ref 22–32)
Calcium: 8.5 mg/dL — ABNORMAL LOW (ref 8.9–10.3)
Chloride: 101 mmol/L (ref 98–111)
Creatinine, Ser: 0.9 mg/dL (ref 0.44–1.00)
GFR calc Af Amer: >60 mL/min (ref >60)
GFR calc non Af Amer: 57 mL/min — ABNORMAL LOW (ref >60)
Glucose, Bld: 119 mg/dL — ABNORMAL HIGH (ref 70–99)
Potassium: 4.3 mmol/L (ref 3.5–5.1)
Sodium: 136 mmol/L (ref 135–145)

## 2019-06-27 LAB — CBG MONITORING, ED: Glucose-Capillary: 115 mg/dL — ABNORMAL HIGH (ref 70–99)

## 2019-06-27 MED ORDER — FENTANYL CITRATE (PF) 100 MCG/2ML IJ SOLN
50.0000 ug | Freq: Once | INTRAMUSCULAR | Status: AC
  Administered 2019-06-27: 14:00:00 50 ug via INTRAVENOUS
  Filled 2019-06-27: qty 2

## 2019-06-27 MED ORDER — OXYCODONE-ACETAMINOPHEN 5-325 MG PO TABS: 1.0000 | ORAL_TABLET | Freq: Three times a day (TID) | ORAL | 0 refills | Status: DC | PRN

## 2019-06-27 MED ORDER — SODIUM CHLORIDE 0.9 % IV BOLUS
500.0000 mL | Freq: Once | INTRAVENOUS | Status: AC
  Administered 2019-06-27: 500 mL via INTRAVENOUS

## 2019-06-27 NOTE — Progress Notes (Signed)
Orthopedic Tech Progress Note Patient Details:  [name] 09/12/1929 [id number] Called in order to [facility] for a TLSO Patient ID: [name], female   DOB: [birth year], 85 y.o.   MRN: [id number]   [name] 06/27/2019, 1:34 PM

## 2019-06-27 NOTE — Discharge Instructions (Addendum)
You were seen in the emergency department for back pain after a fall.  Your CAT scan showed a thoracic compression fracture.  You were provided a back brace and should use it as needed for comfort.  Please follow-up with your doctor and schedule a follow-up appointment with Dr. Venetia Constable from neurosurgery.  Return to the emergency department if any concerns.

## 2019-06-27 NOTE — Progress Notes (Signed)
Orthopedic Tech Progress Note Patient Details:  [name] 12-Sep-1929 [id number] The tech at [facility] was called to place the order to [facility].         Ladell Pier [facility] 06/27/2019, 1:56 PM

## 2019-06-27 NOTE — ED Triage Notes (Signed)
Pt presents with c/o fall that occurred yesterday. Pt was prescribed some percocet for the pain and EMS reported that she was feeling a tad bit more loopy than normal. Pt is alert and oriented, but is a bit slow to answer some questions.

## 2019-06-28 NOTE — ED Provider Notes (Addendum)
[facility] DEPT Provider Note   CSN: [id number] Arrival date & time: 06/27/19  1011     History   Chief Complaint Chief Complaint  Patient presents with   Fall    HPI [name] is a 85 y.o. female.     The history is provided by the patient.  Fall This is a new problem. The current episode started yesterday. The problem occurs constantly. The problem has not changed since onset.Pertinent negatives include no chest pain, no abdominal pain, no headaches and no shortness of breath. The symptoms are aggravated by bending, twisting and standing. Nothing relieves the symptoms. She has tried nothing for the symptoms. The treatment provided no relief.    Past Medical History:  Diagnosis Date   Adenomatous colon polyp 04/2005   Allergic    Anectine   ANXIETY 10/13/2007   BACK PAIN 03/01/2007   BREAST CANCER, HX OF 12/06/2007   Cancer ([place])    RIGHT mastectomy with node dissection   Complication of anesthesia    family aggergy to annectine   Compression fracture of L4 lumbar vertebra 02/2013   CONSTIPATION 01/06/2009   DEPRESSION 10/13/2007   Diverticular disease    DIVERTICULOSIS, COLON 05/11/2005   External hemorrhoids without mention of complication [id number]   FATIGUE 08/07/2008   HEMORRHOIDS, INTERNAL 05/11/2005   Hypercalcemia 04/23/2009   HYPERLIPIDEMIA NEC/NOS 03/01/2007   HYPERTENSION 10/13/2007   Impaired fasting glucose 11/14/2008   INTERSTITIAL CYSTITIS 09/06/2007   Irritable bowel syndrome 01/06/2009   LIVER MASS 05/15/2009   OSTEOPOROSIS 05/09/2009   PARESTHESIA 09/06/2007   POLYCYTHEMIA 04/23/2009   STENOSIS, SPINAL, UNSPC REGION 03/01/2007   Thyroid disease     Patient Active Problem List   Diagnosis Date Noted   Cognitive deficit due to recent stroke 03/08/2017   Alteration of sensation as late effect of stroke 03/08/2017   Gait disturbance, post-stroke 03/08/2017   Acute arterial ischemic stroke,  vertebrobasilar, thalamic, left ([place]) 01/10/2017   Neurologic gait disorder    Depression    Benign essential HTN    History of breast cancer    Stroke (cerebrum) ([place]) 01/08/2017   TIA (transient ischemic attack) 12/16/2016   Dysarthria 12/16/2016   Leg weakness 12/16/2016   EKG abnormalities 12/16/2016   Chronic pain 12/16/2016   Elevated blood pressure 12/22/2014   Internal hemorrhoids with bleeding and prolapse 11/04/2014   Lactose intolerance 11/04/2014   Hemorrhoid 09/11/2014   Rectal bleeding 09/11/2014   IBS (irritable bowel syndrome) 09/25/2013   Hyperparathyroidism, primary ([place]) 05/26/2011   Osteoporosis 05/09/2009   POLYCYTHEMIA 04/23/2009   IMPAIRED FASTING GLUCOSE 11/14/2008   BREAST CANCER, HX OF 12/06/2007   Anxiety state 10/13/2007   Essential hypertension 10/13/2007   INTERSTITIAL CYSTITIS 09/06/2007   HLD (hyperlipidemia) 03/01/2007   Backache 03/01/2007    Past Surgical History:  Procedure Laterality Date   ABDOMINAL HYSTERECTOMY     BACK SURGERY     BREAST SURGERY     mastectomy -right   CERVICAL SPINE SURGERY     CHOLECYSTECTOMY     COLONOSCOPY W/ BIOPSIES     cortisone injection  10/16/12   left knee   FIXATION KYPHOPLASTY LUMBAR SPINE     HEMORRHOID BANDING  2015-16   PARATHYROID EXPLORATION  06/29/2011   Procedure: PARATHYROID EXPLORATION;  Surgeon: Earnstine Regal, MD;  Location: [facility] ORS;  Service: General;  Laterality: Left;  Left Superior Parathyroidectomy   PARATHYROIDECTOMY  06/29/11     OB History   No  obstetric history on file.      Home Medications    Prior to Admission medications   Medication Sig Start Date End Date Taking? Authorizing Provider  acetaminophen (TYLENOL) 500 MG tablet Take 1,000 mg by mouth every 6 (six) hours as needed for mild pain.    [provider]  Alum & Mag Hydroxide-Simeth (GELUSIL PO) Take 1 tablet by mouth daily as needed (acid reflux).    [provider]  Ascorbic Acid (VITAMIN C PO) Take 1 tablet by mouth daily as needed (when not eating enough fruit).    [provider]  aspirin 81 MG chewable tablet Chew 1 tablet (81 mg total) by mouth daily. Patient not taking: Reported on 12/08/2017 12/18/16   Rosita Fire, MD  buPROPion (WELLBUTRIN XL) 300 MG 24 hr tablet Take 1 tablet (300 mg total) by mouth daily. 01/18/17   Angiulli, Lavon Paganini, PA-C  CALCIUM-MAGNESIUM PO Take 1 tablet by mouth daily.    [provider]  carisoprodol (SOMA) 250 MG tablet Take 250 mg by mouth 2 (two) times daily.    [provider]  carisoprodol (SOMA) 350 MG tablet Take 1 tablet (350 mg total) by mouth daily as needed (muscle spasms). 01/18/17   Angiulli, Lavon Paganini, PA-C  celecoxib (CELEBREX) 200 MG capsule Take 1 capsule (200 mg total) by mouth daily as needed (arthritis pain). 01/18/17   Angiulli, Lavon Paganini, PA-C  Cholecalciferol (VITAMIN D3) 1000 units CAPS Take 1 capsule (1,000 Units total) by mouth daily. 01/18/17   Angiulli, Lavon Paganini, PA-C  clopidogrel (PLAVIX) 75 MG tablet Take 1 tablet (75 mg total) by mouth daily. Patient not taking: Reported on 12/08/2017 01/19/17   Angiulli, Lavon Paganini, PA-C  Cyanocobalamin (VITAMIN B-12 PO) Take 1 tablet by mouth daily.    [provider]  diazepam (VALIUM) 10 MG tablet Take 10 mg by mouth 2 (two) times daily as needed for anxiety.     [provider]  diclofenac sodium (VOLTAREN) 1 % GEL Apply 2 g topically 4 (four) times daily. Patient not taking: Reported on 12/08/2017 01/18/17   Angiulli, Lavon Paganini, PA-C  estradiol (ESTRACE) 0.1 MG/GM vaginal cream Place 1 Applicatorful vaginally daily as needed (for dryness).     [provider]  fluticasone (FLONASE) 50 MCG/ACT nasal spray Place 2 sprays into both nostrils daily. Patient taking differently: Place 2 sprays into both nostrils daily as needed for allergies.  09/06/14   Hoyt Koch, MD  gabapentin  (NEURONTIN) 100 MG capsule Take 1 capsule (100 mg total) by mouth 3 (three) times daily. 01/05/18   Pieter Partridge, DO  hydrOXYzine (VISTARIL) 25 MG capsule Take 1 capsule (25 mg total) by mouth at bedtime. Patient not taking: Reported on 07/16/2017 01/18/17   Angiulli, Lavon Paganini, PA-C  metoprolol tartrate (LOPRESSOR) 25 MG tablet Take 1 tablet (25 mg total) by mouth 2 (two) times daily. Patient not taking: Reported on 01/05/2018 01/18/17   Angiulli, Lavon Paganini, PA-C  Multiple Vitamin (MULTIVITAMIN WITH MINERALS) TABS tablet Take 1 tablet by mouth daily as needed (when not eating well).    [provider]  NONFORMULARY OR COMPOUNDED ITEM Equal parts Nystatin 100,000 units/ml and viscous lidocaine 2%.  Swish 15 ml and spit q 6 hour prn disp 250 ml 0 RF Patient not taking: Reported on 05/25/2017 01/27/17   Charlett Blake, MD  oxyCODONE-acetaminophen (PERCOCET/ROXICET) 5-325 MG tablet Take 1-2 tablets by mouth every 8 (eight) hours as needed for severe  pain. 06/27/19   Hayden Rasmussen, MD  polyethylene glycol [facility] / Floria Raveling) packet Take 17 g by mouth daily. Patient not taking: Reported on 07/16/2017 01/19/17   Angiulli, Lavon Paganini, PA-C  pravastatin (PRAVACHOL) 40 MG tablet Take 1 tablet (40 mg total) by mouth daily at 6 PM. Patient not taking: Reported on 05/25/2017 02/14/17   Angiulli, Lavon Paganini, PA-C  Red Yeast Rice Extract 600 MG CAPS Take 1 capsule by mouth daily.    [provider]  VITAMIN E PO Take 1 capsule by mouth daily.    [provider]  Zoledronic Acid (RECLAST IV) Inject into the vein. Yearly injection - last injection March 2018    [provider]    Family History Family History  Problem Relation Age of Onset   Heart disease Sister    Hypertension Sister    Asthma Sister    Breast cancer Maternal Aunt    Ovarian cancer Paternal Aunt    Colon cancer Neg Hx    Esophageal cancer Neg Hx    Pancreatic cancer Neg Hx    Rectal cancer Neg Hx     Stomach cancer Neg Hx     Social History Social History   Tobacco Use   Smoking status: Former Smoker    Packs/day: 1.00    Years: 42.00    Pack years: 42.00    Types: Cigarettes    Quit date: 08/23/1977    Years since quitting: 41.8   Smokeless tobacco: Never Used  Substance Use Topics   Alcohol use: No   Drug use: No     Allergies   Anectine [succinylcholine chloride], Dilaudid [hydromorphone hcl], Hydrocodone-acetaminophen, Iodine, Naproxen sodium, Oxycodone hcl, Pentosan polysulfate sodium, Pseudoephedrine, Succinylcholine, Tape, Tramadol hcl, and Shellfish allergy   Review of Systems Review of Systems  Constitutional: Negative for fever.  HENT: Negative for sore throat.   Eyes: Negative for visual disturbance.  Respiratory: Negative for shortness of breath.   Cardiovascular: Negative for chest pain.  Gastrointestinal: Negative for abdominal pain.  Genitourinary: Negative for dysuria.  Musculoskeletal: Positive for back pain. Negative for neck pain.  Skin: Negative for rash.  Neurological: Negative for headaches.     Physical Exam Updated Vital Signs BP (!) 160/67 (BP Location: Left Arm)    Pulse (!) 114    Temp 98.3 F (36.8 C) (Oral)    Resp (!) 22    SpO2 95%   Physical Exam Vitals signs and nursing note reviewed.  Constitutional:      General: She is not in acute distress.    Appearance: She is well-developed.  HENT:     Head: Normocephalic and atraumatic.  Eyes:     Conjunctiva/sclera: Conjunctivae normal.  Neck:     Musculoskeletal: Neck supple.  Cardiovascular:     Rate and Rhythm: Regular rhythm. Tachycardia present.     Heart sounds: No murmur.  Pulmonary:     Effort: Pulmonary effort is normal. No respiratory distress.     Breath sounds: Normal breath sounds.  Abdominal:     Palpations: Abdomen is soft.     Tenderness: There is no abdominal tenderness.  Musculoskeletal:        General: Tenderness present.     Comments: From upper  and lower extremities. Tender throughout thoracic and lumbar spine.   Skin:    General: Skin is warm and dry.     Capillary Refill: Capillary refill takes less than 2 seconds.  Neurological:     General:  No focal deficit present.     Mental Status: She is alert.     Sensory: No sensory deficit.     Motor: No weakness.      ED Treatments / Results  Labs (all labs ordered are listed, but only abnormal results are displayed) Labs Reviewed  BASIC METABOLIC PANEL - Abnormal; Notable for the following components:      Result Value   Glucose, Bld 119 (*)    BUN 24 (*)    Calcium 8.5 (*)    GFR calc non Af Amer 57 (*)    All other components within normal limits  CBC WITH DIFFERENTIAL/PLATELET - Abnormal; Notable for the following components:   HCT 46.1 (*)    All other components within normal limits  CBG MONITORING, ED - Abnormal; Notable for the following components:   Glucose-Capillary 115 (*)    All other components within normal limits    EKG EKG Interpretation  Date/Time:  Wednesday June 27 2019 10:23:17 EST Ventricular Rate:  113 PR Interval:    QRS Duration: 137 QT Interval:  364 QTC Calculation: 500 R Axis:   71 Text Interpretation: Sinus tachycardia Right bundle branch block increased rate since prior 9/18 Confirmed by Aletta Edouard [phone]) on 06/27/2019 10:31:09 AM   Radiology Dg Chest 1 View  Result Date: 06/27/2019 CLINICAL DATA:  Fall EXAM: CHEST  1 VIEW COMPARISON:  2018 FINDINGS: No new consolidation or edema. Possible small left pleural effusion. No pneumothorax. Stable cardiomediastinal contours. Chronic right rib fractures. Multiple surgical clips overlie the right chest wall. IMPRESSION: Possible small left pleural effusion. No acute rib fracture identified on this single view. Electronically Signed   By: Macy Mis M.D.   On: 06/27/2019 11:02   Ct Thoracic Spine Wo Contrast  Result Date: 06/27/2019 CLINICAL DATA:  Status post fall, pain.  EXAM: CT THORACIC AND LUMBAR SPINE WITHOUT CONTRAST TECHNIQUE: Multidetector CT imaging of the thoracic and lumbar spine was performed without contrast. Multiplanar CT image reconstructions were also generated. COMPARISON:  None. FINDINGS: CT THORACIC SPINE FINDINGS Alignment: Normal. Vertebrae: Chronic T3 vertebral body compression fracture. Chronic T8 vertebral body compression fracture. Chronic T11 vertebral body compression fracture with superimposed anterior cortical buckling and a linear lucency along the inferior endplate low most consistent with an acute compression fractures superimposed upon a chronic fracture. Schmorl's node along the superior endplate of [id number]. Overall 50% central height loss of the T11 vertebral body. Chronic L1 vertebral body compression fracture with methylmethacrylate within the L1 vertebral body. Paraspinal and other soft tissues: No acute paraspinal abnormality. Abdominal aortic atherosclerosis. Multiple old right posterior rib fractures. Disc levels: Mild degenerative disc disease with disc height loss at T4-5. No foraminal or central canal stenosis. CT LUMBAR SPINE FINDINGS Segmentation: Transitional anatomy with lumbarization of the S1 vertebral body. Alignment: Normal. Vertebrae: No discitis or osteomyelitis. Generalized osteopenia. Chronic L1 vertebral body compression fracture status post prior augmentation with methylmethacrylate within the vertebral body and approximately 50% height loss. L3 vertebral body compression fracture status post augmentation with methylmethacrylate within the vertebral body and approximately 40% central height loss. Chronic L4 vertebral body compression fracture with methylmethacrylate within the vertebral body and 3 mm of retropulsion of the superior posterior margin. No acute fracture. Paraspinal and other soft tissues: Abdominal aortic atherosclerosis. Healed bilateral sacral insufficiency fractures. Disc levels: Disc spaces are relatively well  maintained. No foraminal or central canal stenosis. Mild broad-based disc bulge at L5-S1. IMPRESSION: CT THORACIC SPINE IMPRESSION 1. Acute compression fracture  of T11 vertebral body superimposed upon a chronic compression fracture with overall 50% central height loss. 2. Chronic T3, T8 and L1 vertebral body compression fractures. CT LUMBAR SPINE IMPRESSION 1.  No acute osseous injury of the lumbar spine. 2. Chronic L1, L3 and L4 vertebral body compression fractures status post prior augmentation. Electronically Signed   By: Kathreen Devoid   On: 06/27/2019 11:52   Ct Lumbar Spine Wo Contrast  Result Date: 06/27/2019 CLINICAL DATA:  Status post fall, pain. EXAM: CT THORACIC AND LUMBAR SPINE WITHOUT CONTRAST TECHNIQUE: Multidetector CT imaging of the thoracic and lumbar spine was performed without contrast. Multiplanar CT image reconstructions were also generated. COMPARISON:  None. FINDINGS: CT THORACIC SPINE FINDINGS Alignment: Normal. Vertebrae: Chronic T3 vertebral body compression fracture. Chronic T8 vertebral body compression fracture. Chronic T11 vertebral body compression fracture with superimposed anterior cortical buckling and a linear lucency along the inferior endplate low most consistent with an acute compression fractures superimposed upon a chronic fracture. Schmorl's node along the superior endplate of 624THL. Overall 50% central height loss of the T11 vertebral body. Chronic L1 vertebral body compression fracture with methylmethacrylate within the L1 vertebral body. Paraspinal and other soft tissues: No acute paraspinal abnormality. Abdominal aortic atherosclerosis. Multiple old right posterior rib fractures. Disc levels: Mild degenerative disc disease with disc height loss at T4-5. No foraminal or central canal stenosis. CT LUMBAR SPINE FINDINGS Segmentation: Transitional anatomy with lumbarization of the S1 vertebral body. Alignment: Normal. Vertebrae: No discitis or osteomyelitis. Generalized  osteopenia. Chronic L1 vertebral body compression fracture status post prior augmentation with methylmethacrylate within the vertebral body and approximately 50% height loss. L3 vertebral body compression fracture status post augmentation with methylmethacrylate within the vertebral body and approximately 40% central height loss. Chronic L4 vertebral body compression fracture with methylmethacrylate within the vertebral body and 3 mm of retropulsion of the superior posterior margin. No acute fracture. Paraspinal and other soft tissues: Abdominal aortic atherosclerosis. Healed bilateral sacral insufficiency fractures. Disc levels: Disc spaces are relatively well maintained. No foraminal or central canal stenosis. Mild broad-based disc bulge at L5-S1. IMPRESSION: CT THORACIC SPINE IMPRESSION 1. Acute compression fracture of T11 vertebral body superimposed upon a chronic compression fracture with overall 50% central height loss. 2. Chronic T3, T8 and L1 vertebral body compression fractures. CT LUMBAR SPINE IMPRESSION 1.  No acute osseous injury of the lumbar spine. 2. Chronic L1, L3 and L4 vertebral body compression fractures status post prior augmentation. Electronically Signed   By: Kathreen Devoid   On: 06/27/2019 11:52    Procedures Procedures (including critical care time)  Medications Ordered in ED Medications  sodium chloride 0.9 % bolus 500 mL (0 mLs Intravenous Stopped 06/27/19 1634)  fentaNYL (SUBLIMAZE) injection 50 mcg (50 mcg Intravenous Given 06/27/19 1348)     Initial Impression / Assessment and Plan / ED Course  I have reviewed the triage vital signs and the nursing notes.  Pertinent labs & imaging results that were available during my care of the patient were reviewed by me and considered in my medical decision making (see chart for details).  Clinical Course as of Jun 28 1915  Wed Jun 26, 2364  [id number] 85 year old female with thoracic and lumbar back pain after a mechanical fall.   Tachycardic here.  Intact neurologically.  Differential diagnosis includes musculoskeletal pain, spinal fracture, contusion.  Initial chest x-ray reviewed by me and we will see any gross pneumothorax.  Awaiting labs and advanced imaging of thoracic and lumbar spine.   [  MB]  1239 Discussed with Dr. Venetia Constable from neurosurgery.  He recommends a TLSO brace as needed for comfort and can follow-up with him in 6 weeks.   [MB]  83 [employer] is ordered the brace and we are waiting for it to show up.   [MB]    Clinical Course User Index [MB] Hayden Rasmussen, MD        Final Clinical Impressions(s) / ED Diagnoses   Final diagnoses:  Closed wedge compression fracture of T11 vertebra, initial encounter [facility])  Fall, initial encounter    ED Discharge Orders         Ordered    oxyCODONE-acetaminophen (PERCOCET/ROXICET) 5-325 MG tablet  Every 8 hours PRN     06/27/19 1606           Hayden Rasmussen, MD 06/28/19 1918    Hayden Rasmussen, MD 06/28/19 1918

## 2019-07-17 ENCOUNTER — Inpatient Hospital Stay (HOSPITAL_COMMUNITY): Payer: Medicare Other

## 2019-07-17 ENCOUNTER — Other Ambulatory Visit: Payer: Self-pay

## 2019-07-17 ENCOUNTER — Inpatient Hospital Stay (HOSPITAL_COMMUNITY)
Admission: EM | Admit: 2019-07-17 | Discharge: 2019-07-23 | DRG: 948 | Disposition: A | Discharge status: Home Health | Payer: Medicare Other | Attending: Family Medicine | Admitting: Family Medicine

## 2019-07-17 ENCOUNTER — Emergency Department (HOSPITAL_COMMUNITY): Payer: Medicare Other

## 2019-07-17 DIAGNOSIS — M810 Age-related osteoporosis without current pathological fracture: Secondary | ICD-10-CM | POA: Diagnosis present

## 2019-07-17 DIAGNOSIS — Z87891 Personal history of nicotine dependence: Secondary | ICD-10-CM

## 2019-07-17 DIAGNOSIS — Z825 Family history of asthma and other chronic lower respiratory diseases: Secondary | ICD-10-CM

## 2019-07-17 DIAGNOSIS — F411 Generalized anxiety disorder: Secondary | ICD-10-CM | POA: Diagnosis present

## 2019-07-17 DIAGNOSIS — X58XXXA Exposure to other specified factors, initial encounter: Secondary | ICD-10-CM | POA: Diagnosis present

## 2019-07-17 DIAGNOSIS — Z79899 Other long term (current) drug therapy: Secondary | ICD-10-CM

## 2019-07-17 DIAGNOSIS — R4182 Altered mental status, unspecified: Secondary | ICD-10-CM | POA: Diagnosis present

## 2019-07-17 DIAGNOSIS — Z20828 Contact with and (suspected) exposure to other viral communicable diseases: Secondary | ICD-10-CM | POA: Diagnosis present

## 2019-07-17 DIAGNOSIS — R410 Disorientation, unspecified: Secondary | ICD-10-CM | POA: Diagnosis not present

## 2019-07-17 DIAGNOSIS — F0390 Unspecified dementia without behavioral disturbance: Secondary | ICD-10-CM | POA: Diagnosis present

## 2019-07-17 DIAGNOSIS — Z9049 Acquired absence of other specified parts of digestive tract: Secondary | ICD-10-CM

## 2019-07-17 DIAGNOSIS — W19XXXA Unspecified fall, initial encounter: Secondary | ICD-10-CM | POA: Diagnosis present

## 2019-07-17 DIAGNOSIS — T50915A Adverse effect of multiple unspecified drugs, medicaments and biological substances, initial encounter: Secondary | ICD-10-CM | POA: Diagnosis present

## 2019-07-17 DIAGNOSIS — F4312 Post-traumatic stress disorder, chronic: Secondary | ICD-10-CM | POA: Diagnosis present

## 2019-07-17 DIAGNOSIS — E785 Hyperlipidemia, unspecified: Secondary | ICD-10-CM | POA: Diagnosis present

## 2019-07-17 DIAGNOSIS — R471 Dysarthria and anarthria: Secondary | ICD-10-CM | POA: Diagnosis not present

## 2019-07-17 DIAGNOSIS — Z66 Do not resuscitate: Secondary | ICD-10-CM | POA: Diagnosis present

## 2019-07-17 DIAGNOSIS — K581 Irritable bowel syndrome with constipation: Secondary | ICD-10-CM | POA: Diagnosis present

## 2019-07-17 DIAGNOSIS — Z853 Personal history of malignant neoplasm of breast: Secondary | ICD-10-CM

## 2019-07-17 DIAGNOSIS — Z9089 Acquired absence of other organs: Secondary | ICD-10-CM | POA: Diagnosis not present

## 2019-07-17 DIAGNOSIS — I10 Essential (primary) hypertension: Secondary | ICD-10-CM | POA: Diagnosis present

## 2019-07-17 DIAGNOSIS — R296 Repeated falls: Secondary | ICD-10-CM | POA: Diagnosis present

## 2019-07-17 DIAGNOSIS — S22080A Wedge compression fracture of T11-T12 vertebra, initial encounter for closed fracture: Secondary | ICD-10-CM | POA: Diagnosis present

## 2019-07-17 DIAGNOSIS — F329 Major depressive disorder, single episode, unspecified: Secondary | ICD-10-CM | POA: Diagnosis present

## 2019-07-17 DIAGNOSIS — Z79890 Hormone replacement therapy (postmenopausal): Secondary | ICD-10-CM

## 2019-07-17 DIAGNOSIS — Z8673 Personal history of transient ischemic attack (TIA), and cerebral infarction without residual deficits: Secondary | ICD-10-CM

## 2019-07-17 DIAGNOSIS — Z791 Long term (current) use of non-steroidal anti-inflammatories (NSAID): Secondary | ICD-10-CM

## 2019-07-17 DIAGNOSIS — F05 Delirium due to known physiological condition: Secondary | ICD-10-CM | POA: Diagnosis present

## 2019-07-17 DIAGNOSIS — Z9181 History of falling: Secondary | ICD-10-CM

## 2019-07-17 DIAGNOSIS — Z886 Allergy status to analgesic agent status: Secondary | ICD-10-CM

## 2019-07-17 DIAGNOSIS — R739 Hyperglycemia, unspecified: Secondary | ICD-10-CM | POA: Diagnosis present

## 2019-07-17 DIAGNOSIS — Z79891 Long term (current) use of opiate analgesic: Secondary | ICD-10-CM

## 2019-07-17 DIAGNOSIS — R55 Syncope and collapse: Secondary | ICD-10-CM | POA: Diagnosis present

## 2019-07-17 DIAGNOSIS — Z7902 Long term (current) use of antithrombotics/antiplatelets: Secondary | ICD-10-CM

## 2019-07-17 DIAGNOSIS — J45909 Unspecified asthma, uncomplicated: Secondary | ICD-10-CM | POA: Diagnosis present

## 2019-07-17 DIAGNOSIS — Z781 Physical restraint status: Secondary | ICD-10-CM

## 2019-07-17 DIAGNOSIS — Z7982 Long term (current) use of aspirin: Secondary | ICD-10-CM

## 2019-07-17 DIAGNOSIS — G8929 Other chronic pain: Secondary | ICD-10-CM | POA: Diagnosis present

## 2019-07-17 DIAGNOSIS — I4510 Unspecified right bundle-branch block: Secondary | ICD-10-CM | POA: Diagnosis present

## 2019-07-17 DIAGNOSIS — Z90710 Acquired absence of both cervix and uterus: Secondary | ICD-10-CM | POA: Diagnosis not present

## 2019-07-17 DIAGNOSIS — Z8249 Family history of ischemic heart disease and other diseases of the circulatory system: Secondary | ICD-10-CM

## 2019-07-17 DIAGNOSIS — Z91041 Radiographic dye allergy status: Secondary | ICD-10-CM

## 2019-07-17 DIAGNOSIS — F0391 Unspecified dementia with behavioral disturbance: Secondary | ICD-10-CM | POA: Diagnosis not present

## 2019-07-17 DIAGNOSIS — Z9011 Acquired absence of right breast and nipple: Secondary | ICD-10-CM

## 2019-07-17 DIAGNOSIS — Z91048 Other nonmedicinal substance allergy status: Secondary | ICD-10-CM

## 2019-07-17 DIAGNOSIS — Z885 Allergy status to narcotic agent status: Secondary | ICD-10-CM

## 2019-07-17 DIAGNOSIS — Z9114 Patient's other noncompliance with medication regimen: Secondary | ICD-10-CM

## 2019-07-17 DIAGNOSIS — Z888 Allergy status to other drugs, medicaments and biological substances status: Secondary | ICD-10-CM

## 2019-07-17 LAB — URINALYSIS, ROUTINE W REFLEX MICROSCOPIC
Bilirubin Urine: NEGATIVE
Glucose, UA: NEGATIVE mg/dL
Hgb urine dipstick: NEGATIVE
Ketones, ur: NEGATIVE mg/dL
Leukocytes,Ua: NEGATIVE
Nitrite: NEGATIVE
Protein, ur: NEGATIVE mg/dL
Specific Gravity, Urine: 1.013 (ref 1.005–1.030)
pH: 6 (ref 5.0–8.0)

## 2019-07-17 LAB — CBC WITH DIFFERENTIAL/PLATELET
Abs Immature Granulocytes: 0.02 10*3/uL (ref 0.00–0.07)
Basophils Absolute: 0 10*3/uL (ref 0.0–0.1)
Basophils Relative: 1 %
Eosinophils Absolute: 0.1 10*3/uL (ref 0.0–0.5)
Eosinophils Relative: 1 %
HCT: 44.2 % (ref 36.0–46.0)
Hemoglobin: 14.4 g/dL (ref 12.0–15.0)
Immature Granulocytes: 0 %
Lymphocytes Relative: 30 %
Lymphs Abs: 2.4 10*3/uL (ref 0.7–4.0)
MCH: 30.9 pg (ref 26.0–34.0)
MCHC: 32.6 g/dL (ref 30.0–36.0)
MCV: 94.8 fL (ref 80.0–100.0)
Monocytes Absolute: 0.5 10*3/uL (ref 0.1–1.0)
Monocytes Relative: 6 %
Neutro Abs: 4.9 10*3/uL (ref 1.7–7.7)
Neutrophils Relative %: 62 %
Platelets: 203 10*3/uL (ref 150–400)
RBC: 4.66 MIL/uL (ref 3.87–5.11)
RDW: 11.6 % (ref 11.5–15.5)
WBC: 7.9 10*3/uL (ref 4.0–10.5)
nRBC: 0 % (ref 0.0–0.2)

## 2019-07-17 LAB — TROPONIN I (HIGH SENSITIVITY)
Troponin I (High Sensitivity): 7 ng/L (ref <18)
Troponin I (High Sensitivity): 11 ng/L (ref <18)

## 2019-07-17 LAB — COMPREHENSIVE METABOLIC PANEL
ALT: 24 U/L (ref 0–44)
AST: 25 U/L (ref 15–41)
Albumin: 3.5 g/dL (ref 3.5–5.0)
Alkaline Phosphatase: 107 U/L (ref 38–126)
Anion gap: 10 (ref 5–15)
BUN: 18 mg/dL (ref 8–23)
CO2: 29 mmol/L (ref 22–32)
Calcium: 9.1 mg/dL (ref 8.9–10.3)
Chloride: 100 mmol/L (ref 98–111)
Creatinine, Ser: 0.94 mg/dL (ref 0.44–1.00)
GFR calc Af Amer: >60 mL/min (ref >60)
GFR calc non Af Amer: 55 mL/min — ABNORMAL LOW (ref >60)
Glucose, Bld: 147 mg/dL — ABNORMAL HIGH (ref 70–99)
Potassium: 4.1 mmol/L (ref 3.5–5.1)
Sodium: 139 mmol/L (ref 135–145)
Total Bilirubin: 0.8 mg/dL (ref 0.3–1.2)
Total Protein: 6.2 g/dL — ABNORMAL LOW (ref 6.5–8.1)

## 2019-07-17 LAB — SARS CORONAVIRUS 2 (TAT 6-24 HRS): SARS Coronavirus 2: NEGATIVE

## 2019-07-17 MED ORDER — SODIUM CHLORIDE 0.9% FLUSH
3.0000 mL | Freq: Two times a day (BID) | INTRAVENOUS | Status: DC
  Administered 2019-07-17 – 2019-07-19 (×4): 3 mL via INTRAVENOUS

## 2019-07-17 MED ORDER — LORAZEPAM 2 MG/ML IJ SOLN
1.0000 mg | Freq: Two times a day (BID) | INTRAMUSCULAR | Status: AC | PRN
  Administered 2019-07-17 – 2019-07-18 (×2): 1 mg via INTRAVENOUS
  Filled 2019-07-17 (×3): qty 1

## 2019-07-17 MED ORDER — ENOXAPARIN SODIUM 40 MG/0.4ML ~~LOC~~ SOLN
40.0000 mg | SUBCUTANEOUS | Status: DC
  Administered 2019-07-18 – 2019-07-20 (×2): 40 mg via SUBCUTANEOUS
  Filled 2019-07-17 (×4): qty 0.4

## 2019-07-17 MED ORDER — DIAZEPAM 5 MG PO TABS
5.0000 mg | ORAL_TABLET | Freq: Every day | ORAL | Status: DC
  Filled 2019-07-17: qty 1

## 2019-07-17 MED ORDER — LORAZEPAM 2 MG/ML IJ SOLN
2.0000 mg | Freq: Once | INTRAMUSCULAR | Status: AC
  Administered 2019-07-17: 2 mg via INTRAVENOUS

## 2019-07-17 NOTE — Consult Note (Signed)
Neurology Consultation  Reason for Consult: Seizure versus syncope Referring Physician: Dr. Rex Kras  CC: 12-minute period of staring to which patient does not recall followed by capability of being lucid  History is obtained from: 24-hour caregiver  HPI: [name] is a 85 y.o. female with significant past medical history of spinal stenosis, polycythemia, paresthesias, irritable bowel syndrome, hypertension-with common blood pressure systolically being in the 200., hyperlipidemia, fatigue, depression, constipation, breast cancer and per caregiver has recently been showing significant sundowning, neurocognitive decline and hallucinations.  As far as ADLs patient is a full care for ADLs.  Patient has significant dating by pharmacy including Soma, Valium, Neurontin, oxycodone.  Today approximately around 945 walked about 13 feet to the bathroom to micturate.  The last thing patient recalls is micturating.  Her her caregiver states that she noted she started to slump over.  She immediately got her up to a sitting position stated that she had a glazed stare, no jerking, no drooling, no eye deviation.  However, this did last for 12 minutes and she does have this timed on her phone.  Post 12 minutes she did start to wake up and garbled slightly and then quickly came to stating "what just happened".  Caregiver also states that over the past 2 weeks she has had a significant decline in her mental status.  She is now having significant sundowning.  In fact, she was seeing people in the room this morning but did not realize they were not real.  She is also having a difficult time following commands and recognizing people.   ED course CT head  Past Medical History:  Diagnosis Date  . Adenomatous colon polyp 04/2005  . Allergic    Anectine  . ANXIETY 10/13/2007  . BACK PAIN 03/01/2007  . BREAST CANCER, HX OF 12/06/2007  . Cancer [facility])    RIGHT mastectomy with node dissection  . Complication of  anesthesia    family aggergy to annectine  . Compression fracture of L4 lumbar vertebra 02/2013  . CONSTIPATION 01/06/2009  . DEPRESSION 10/13/2007  . Diverticular disease   . DIVERTICULOSIS, COLON 05/11/2005  . External hemorrhoids without mention of complication [id number]  . FATIGUE 08/07/2008  . HEMORRHOIDS, INTERNAL 05/11/2005  . Hypercalcemia 04/23/2009  . HYPERLIPIDEMIA NEC/NOS 03/01/2007  . HYPERTENSION 10/13/2007  . Impaired fasting glucose 11/14/2008  . INTERSTITIAL CYSTITIS 09/06/2007  . Irritable bowel syndrome 01/06/2009  . LIVER MASS 05/15/2009  . OSTEOPOROSIS 05/09/2009  . PARESTHESIA 09/06/2007  . POLYCYTHEMIA 04/23/2009  . STENOSIS, SPINAL, UNSPC REGION 03/01/2007  . Thyroid disease     Family History  Problem Relation Age of Onset  . Heart disease Sister   . Hypertension Sister   . Asthma Sister   . Breast cancer Maternal Aunt   . Ovarian cancer Paternal Aunt   . Colon cancer Neg Hx   . Esophageal cancer Neg Hx   . Pancreatic cancer Neg Hx   . Rectal cancer Neg Hx   . Stomach cancer Neg Hx     Social History:   reports that she quit smoking about 41 years ago. Her smoking use included cigarettes. She has a 42.00 pack-year smoking history. She has never used smokeless tobacco. She reports that she does not drink alcohol or use drugs.  Medications No current facility-administered medications for this encounter.   Current Outpatient Medications:  .  acetaminophen (TYLENOL) 500 MG tablet, Take 1,000 mg by mouth every 6 (six) hours as needed for mild pain., Disp: ,  Rfl:  .  Alum & Mag Hydroxide-Simeth (GELUSIL PO), Take 1 tablet by mouth daily as needed (acid reflux)., Disp: , Rfl:  .  Ascorbic Acid (VITAMIN C PO), Take 1 tablet by mouth daily as needed (when not eating enough fruit)., Disp: , Rfl:  .  aspirin 81 MG chewable tablet, Chew 1 tablet (81 mg total) by mouth daily. (Patient not taking: Reported on 12/08/2017), Disp: 30 tablet, Rfl: 0 .  buPROPion (WELLBUTRIN XL) 300  MG 24 hr tablet, Take 1 tablet (300 mg total) by mouth daily., Disp: 30 tablet, Rfl: 1 .  CALCIUM-MAGNESIUM PO, Take 1 tablet by mouth daily., Disp: , Rfl:  .  carisoprodol (SOMA) 250 MG tablet, Take 250 mg by mouth 2 (two) times daily., Disp: , Rfl:  .  carisoprodol (SOMA) 350 MG tablet, Take 1 tablet (350 mg total) by mouth daily as needed (muscle spasms)., Disp: 30 tablet, Rfl: 0 .  celecoxib (CELEBREX) 200 MG capsule, Take 1 capsule (200 mg total) by mouth daily as needed (arthritis pain)., Disp: 30 capsule, Rfl: 0 .  Cholecalciferol (VITAMIN D3) 1000 units CAPS, Take 1 capsule (1,000 Units total) by mouth daily., Disp: 60 capsule, Rfl: 0 .  clopidogrel (PLAVIX) 75 MG tablet, Take 1 tablet (75 mg total) by mouth daily. (Patient not taking: Reported on 12/08/2017), Disp: 30 tablet, Rfl: 1 .  Cyanocobalamin (VITAMIN B-12 PO), Take 1 tablet by mouth daily., Disp: , Rfl:  .  diazepam (VALIUM) 10 MG tablet, Take 10 mg by mouth 2 (two) times daily as needed for anxiety. , Disp: , Rfl:  .  diclofenac sodium (VOLTAREN) 1 % GEL, Apply 2 g topically 4 (four) times daily. (Patient not taking: Reported on 12/08/2017), Disp: 1 Tube, Rfl: 0 .  estradiol (ESTRACE) 0.1 MG/GM vaginal cream, Place 1 Applicatorful vaginally daily as needed (for dryness). , Disp: , Rfl:  .  fluticasone (FLONASE) 50 MCG/ACT nasal spray, Place 2 sprays into both nostrils daily. (Patient taking differently: Place 2 sprays into both nostrils daily as needed for allergies. ), Disp: 16 g, Rfl: 2 .  gabapentin (NEURONTIN) 100 MG capsule, Take 1 capsule (100 mg total) by mouth 3 (three) times daily., Disp: 30 capsule, Rfl: 2 .  hydrOXYzine (VISTARIL) 25 MG capsule, Take 1 capsule (25 mg total) by mouth at bedtime. (Patient not taking: Reported on 07/16/2017), Disp: 30 capsule, Rfl: 0 .  metoprolol tartrate (LOPRESSOR) 25 MG tablet, Take 1 tablet (25 mg total) by mouth 2 (two) times daily. (Patient not taking: Reported on 01/05/2018), Disp: 60  tablet, Rfl: 1 .  Multiple Vitamin (MULTIVITAMIN WITH MINERALS) TABS tablet, Take 1 tablet by mouth daily as needed (when not eating well)., Disp: , Rfl:  .  NONFORMULARY OR COMPOUNDED ITEM, Equal parts Nystatin 100,000 units/ml and viscous lidocaine 2%.  Swish 15 ml and spit q 6 hour prn disp 250 ml 0 RF (Patient not taking: Reported on 05/25/2017), Disp: 250 each, Rfl: 0 .  oxyCODONE-acetaminophen (PERCOCET/ROXICET) 5-325 MG tablet, Take 1-2 tablets by mouth every 8 (eight) hours as needed for severe pain., Disp: 12 tablet, Rfl: 0 .  polyethylene glycol (MIRALAX / GLYCOLAX) packet, Take 17 g by mouth daily. (Patient not taking: Reported on 07/16/2017), Disp: 14 each, Rfl: 0 .  pravastatin (PRAVACHOL) 40 MG tablet, Take 1 tablet (40 mg total) by mouth daily at 6 PM. (Patient not taking: Reported on 05/25/2017), Disp: 30 tablet, Rfl: 0 .  Red Yeast Rice Extract 600 MG CAPS, Take 1  capsule by mouth daily., Disp: , Rfl:  .  VITAMIN E PO, Take 1 capsule by mouth daily., Disp: , Rfl:  .  Zoledronic Acid (RECLAST IV), Inject into the vein. Yearly injection - last injection March 2018, Disp: , Rfl:    Exam: Current vital signs: BP (!) 163/71   Pulse 75   Temp 98.1 F (36.7 C) (Oral)   Resp 18   SpO2 95%  Vital signs in last 24 hours: Temp:  [98.1 F (36.7 C)] 98.1 F (36.7 C) (11/24 1224) Pulse Rate:  [75-104] 75 (11/24 1515) Resp:  [16-26] 18 (11/24 1515) BP: (145-181)/(55-78) 163/71 (11/24 1515) SpO2:  [92 %-95 %] 95 % (11/24 1515)  ROS:    General ROS: negative for - chills, fatigue, fever, night sweats, weight gain or weight loss Psychological ROS: Positive for - hallucinations, memory difficulties, Ophthalmic ROS: negative for - blurry vision, double vision, eye pain or loss of vision ENT ROS: negative for - epistaxis, nasal discharge, oral lesions, sore throat, tinnitus or vertigo Allergy and Immunology ROS: negative for - hives or itchy/watery eyes Respiratory ROS: negative for -  cough, hemoptysis, shortness of breath or wheezing Cardiovascular ROS: negative for - chest pain, dyspnea on exertion, edema or irregular heartbeat Gastrointestinal ROS: As if for -constipation  Genito-Urinary ROS: negative for - dysuria, hematuria, incontinence or urinary frequency/urgency Musculoskeletal ROS: negative for - joint swelling or muscular weakness Neurological ROS: as noted in HPI Dermatological ROS: negative for rash and skin lesion changes   Physical Exam   Constitutional: Appears well-developed and well-nourished.  Psych: Confused however she does have a neurodegenerative decline Eyes: No scleral injection HENT: No OP obstrucion Head: Normocephalic.  Cardiovascular: Normal rate and regular rhythm.  Respiratory: Effort normal, non-labored breathing GI: Soft.  No distension. There is no tenderness.  Skin: WDI  Neuro: Mental Status: Patient is awake, alert, oriented to month however then perseverates a month.  She does have difficulty following simple commands such as finger-to-nose-as she does not understand what I am asking her to do, in addition to counting my fingers, giving by facet flexion and extension she could not mixed up  Cranial Nerves: II: Blinks to threat bilaterally III,IV, VI: EOMI without ptosis or diploplia. Pupils equal, round and reactive to light V: Facial sensation is symmetric to temperature VII: Facial movement is symmetric.  VIII: hearing is intact to voice X: Palat elevates symmetrically XI: Shoulder shrug is symmetric. XII: tongue is midline without atrophy or fasciculations.  Motor: Tone is normal. Bulk is normal. 5/5 strength was present in all four extremities.  Sensory: Sensation is symmetric to light touch and temperature in the arms and upper legs with decreased sensation in stocking distribution Deep Tendon Reflexes: 2+ in the upper extremities no ankle jerk or knee jerk Plantars: Mute bilaterally Cerebellar: Finger-nose-finger  show no dysmetria could not get heel-to-shin with confusion  Labs I have reviewed labs in epic and the results pertinent to this consultation are:   CBC    Component Value Date/Time   WBC 7.9 07/17/2019 1232   RBC 4.66 07/17/2019 1232   HGB 14.4 07/17/2019 1232   HCT 44.2 07/17/2019 1232   PLT 203 07/17/2019 1232   MCV 94.8 07/17/2019 1232   MCH 30.9 07/17/2019 1232   MCHC 32.6 07/17/2019 1232   RDW 11.6 07/17/2019 1232   LYMPHSABS 2.4 07/17/2019 1232   MONOABS 0.5 07/17/2019 1232   EOSABS 0.1 07/17/2019 1232   BASOSABS 0.0 07/17/2019 1232  CMP     Component Value Date/Time   NA 139 07/17/2019 1232   K 4.1 07/17/2019 1232   CL 100 07/17/2019 1232   CO2 29 07/17/2019 1232   GLUCOSE 147 (H) 07/17/2019 1232   BUN 18 07/17/2019 1232   CREATININE 0.94 07/17/2019 1232   CALCIUM 9.1 07/17/2019 1232   CALCIUM 9.5 10/18/2012 1259   PROT 6.2 (L) 07/17/2019 1232   ALBUMIN 3.5 07/17/2019 1232   AST 25 07/17/2019 1232   ALT 24 07/17/2019 1232   ALKPHOS 107 07/17/2019 1232   BILITOT 0.8 07/17/2019 1232   GFRNONAA 55 (L) 07/17/2019 1232   GFRAA >60 07/17/2019 1232    Lipid Panel     Component Value Date/Time   CHOL 181 12/17/2016 0459   TRIG 194 (H) 12/17/2016 0459   HDL 33 (L) 12/17/2016 0459   CHOLHDL 5.5 12/17/2016 0459   VLDL 39 12/17/2016 0459   LDLCALC 109 (H) 12/17/2016 0459   LDLDIRECT 154.7 08/02/2014 1546     Imaging I have reviewed the images obtained:  CT-scan of the brain--no acute intracranial hemorrhage, mass-effect, or evidence of acute infarction  MRI examination of the brain-pending  EEG-pending  Etta Quill PA-C [facility] [phone]  M-F  (9:00 am- 5:00 PM)  07/17/2019, 3:43 PM    I have seen the patient and reviewed the above note.   Assessment:  This is an 85 year old female with a interesting episode that initially was sound like syncope however did last for approximately 12 minutes which would be abnormal for for  syncopal event.  This said, with history of memory disorder concerning for dementia, seizure is definitely a possibility.  For that reason patient definitely should have a MRI and EEG.  Given that there is only been a single event, and it is not definitely a seizure, I do not think that I would pursue antiepileptic therapy unless we have evidence on EEG of seizure potential.  Sghe has significant polypharmacy which could be related to her recent coginitive changes.   Recommendations: -EEG -MRI brain without contrast -Would consider alternative depression medication then Wellbutrin as Wellbutrin can lower seizure threshold -Blood pressure control as her baseline is systolically in the A999333 to 200s.  This is being taken care of with her outpatient cardiologist. -Will make further recommendations after diagnostic tests have been done. -Seizure precautions   Roland Rack, MD [facility] [phone]  If 7pm- 7am, please page neurology on call as listed in [place].

## 2019-07-17 NOTE — Progress Notes (Signed)
Attemped EEG bedside.  Patient agitated, confused and trying to get out of bed.  Staff with patient.  Will attempt EEG again in am. RN bedside and advised on EEG tomorrow

## 2019-07-17 NOTE — ED Provider Notes (Signed)
[place] EMERGENCY DEPARTMENT Provider Note   CSN: [id number] Arrival date & time: 07/17/19  1210     History   Chief Complaint Chief Complaint  Patient presents with  . Loss of Consciousness    HPI ZAMAURIA HELLMICH is a 85 y.o. female presenting for evaluation after syncope.   Presenting for evaluation of syncopal event.  Patient states she does not remember this event, does not think it happened.  Additional history obtained from patient's caregiver.  Per get caregiver, patient walked to the bathroom on her own.  While she was sitting on the toilet, she slumped forward as if she was looking at her shoes.  But then she was not responsive.  She became limp, this lasted for 12 minutes.  Patient's eyes were open, but she had no verbal response or muscle tone.  No seizure-like activity noted.  No loss of bowel bladder control.  Patient gradually improved, initially had garbled speech but then went back to her normal baseline mental status.  Patient denies any complaints at this time.  Denies headache, fevers, cough, chest pain, shortness of breath, lightheadedness, nausea, vomiting, abdominal pain, urinary symptoms, abnormal bowel movements.  Additional history obtained from chart review.  Patient recently been evaluated multiple times for cognitive decline.  Additional history of hypertension for which she is not taking her medication, thyroid disease, depression, T-spine fracture, frequent falls, IBS, hyperlipidemia, previous stroke.  Patient is on multiple sedating medications including Soma, Valium, gabapentin, and percocet.     HPI  Past Medical History:  Diagnosis Date  . Adenomatous colon polyp 04/2005  . Allergic    Anectine  . ANXIETY 10/13/2007  . BACK PAIN 03/01/2007  . BREAST CANCER, HX OF 12/06/2007  . Cancer [facility])    RIGHT mastectomy with node dissection  . Complication of anesthesia    family aggergy to annectine  . Compression fracture of  L4 lumbar vertebra 02/2013  . CONSTIPATION 01/06/2009  . DEPRESSION 10/13/2007  . Diverticular disease   . DIVERTICULOSIS, COLON 05/11/2005  . External hemorrhoids without mention of complication [id number]  . FATIGUE 08/07/2008  . HEMORRHOIDS, INTERNAL 05/11/2005  . Hypercalcemia 04/23/2009  . HYPERLIPIDEMIA NEC/NOS 03/01/2007  . HYPERTENSION 10/13/2007  . Impaired fasting glucose 11/14/2008  . INTERSTITIAL CYSTITIS 09/06/2007  . Irritable bowel syndrome 01/06/2009  . LIVER MASS 05/15/2009  . OSTEOPOROSIS 05/09/2009  . PARESTHESIA 09/06/2007  . POLYCYTHEMIA 04/23/2009  . STENOSIS, SPINAL, UNSPC REGION 03/01/2007  . Thyroid disease     Patient Active Problem List   Diagnosis Date Noted  . Syncope 07/17/2019  . Cognitive deficit due to recent stroke 03/08/2017  . Alteration of sensation as late effect of stroke 03/08/2017  . Gait disturbance, post-stroke 03/08/2017  . Acute arterial ischemic stroke, vertebrobasilar, thalamic, left ([place]) 01/10/2017  . Neurologic gait disorder   . Depression   . Benign essential HTN   . History of breast cancer   . Stroke (cerebrum) ([place]) 01/08/2017  . TIA (transient ischemic attack) 12/16/2016  . Dysarthria 12/16/2016  . Leg weakness 12/16/2016  . EKG abnormalities 12/16/2016  . Chronic pain 12/16/2016  . Elevated blood pressure 12/22/2014  . Internal hemorrhoids with bleeding and prolapse 11/04/2014  . Lactose intolerance 11/04/2014  . Hemorrhoid 09/11/2014  . Rectal bleeding 09/11/2014  . IBS (irritable bowel syndrome) 09/25/2013  . Hyperparathyroidism, primary ([place]) 05/26/2011  . Osteoporosis 05/09/2009  . POLYCYTHEMIA 04/23/2009  . IMPAIRED FASTING GLUCOSE 11/14/2008  . BREAST CANCER, HX OF 12/06/2007  .  Anxiety state 10/13/2007  . Essential hypertension 10/13/2007  . INTERSTITIAL CYSTITIS 09/06/2007  . HLD (hyperlipidemia) 03/01/2007  . Backache 03/01/2007    Past Surgical History:  Procedure Laterality Date  . ABDOMINAL HYSTERECTOMY    .  BACK SURGERY    . BREAST SURGERY     mastectomy -right  . CERVICAL SPINE SURGERY    . CHOLECYSTECTOMY    . COLONOSCOPY W/ BIOPSIES    . cortisone injection  10/16/12   left knee  . FIXATION KYPHOPLASTY LUMBAR SPINE    . HEMORRHOID BANDING  2015-16  . PARATHYROID EXPLORATION  06/29/2011   Procedure: PARATHYROID EXPLORATION;  Surgeon: Earnstine Regal, MD;  Location: [facility] ORS;  Service: General;  Laterality: Left;  Left Superior Parathyroidectomy  . PARATHYROIDECTOMY  06/29/11     OB History   No obstetric history on file.      Home Medications    Prior to Admission medications   Medication Sig Start Date End Date Taking? Authorizing Provider  acetaminophen (TYLENOL) 500 MG tablet Take 1,000 mg by mouth every 6 (six) hours as needed for mild pain.    [provider]  Alum & Mag Hydroxide-Simeth (GELUSIL PO) Take 1 tablet by mouth daily as needed (acid reflux).    [provider]  Ascorbic Acid (VITAMIN C PO) Take 1 tablet by mouth daily as needed (when not eating enough fruit).    [provider]  aspirin 81 MG chewable tablet Chew 1 tablet (81 mg total) by mouth daily. Patient not taking: Reported on 12/08/2017 12/18/16   Rosita Fire, MD  buPROPion (WELLBUTRIN XL) 300 MG 24 hr tablet Take 1 tablet (300 mg total) by mouth daily. 01/18/17   Angiulli, Lavon Paganini, PA-C  CALCIUM-MAGNESIUM PO Take 1 tablet by mouth daily.    [provider]  carisoprodol (SOMA) 250 MG tablet Take 250 mg by mouth 2 (two) times daily.    [provider]  carisoprodol (SOMA) 350 MG tablet Take 1 tablet (350 mg total) by mouth daily as needed (muscle spasms). 01/18/17   Angiulli, Lavon Paganini, PA-C  celecoxib (CELEBREX) 200 MG capsule Take 1 capsule (200 mg total) by mouth daily as needed (arthritis pain). 01/18/17   Angiulli, Lavon Paganini, PA-C  Cholecalciferol (VITAMIN D3) 1000 units CAPS Take 1 capsule (1,000 Units total) by mouth daily. 01/18/17   Angiulli, Lavon Paganini, PA-C   clopidogrel (PLAVIX) 75 MG tablet Take 1 tablet (75 mg total) by mouth daily. Patient not taking: Reported on 12/08/2017 01/19/17   Angiulli, Lavon Paganini, PA-C  Cyanocobalamin (VITAMIN B-12 PO) Take 1 tablet by mouth daily.    [provider]  diazepam (VALIUM) 10 MG tablet Take 10 mg by mouth 2 (two) times daily as needed for anxiety.     [provider]  diclofenac sodium (VOLTAREN) 1 % GEL Apply 2 g topically 4 (four) times daily. Patient not taking: Reported on 12/08/2017 01/18/17   Angiulli, Lavon Paganini, PA-C  estradiol (ESTRACE) 0.1 MG/GM vaginal cream Place 1 Applicatorful vaginally daily as needed (for dryness).     [provider]  fluticasone (FLONASE) 50 MCG/ACT nasal spray Place 2 sprays into both nostrils daily. Patient taking differently: Place 2 sprays into both nostrils daily as needed for allergies.  09/06/14   Hoyt Koch, MD  gabapentin (NEURONTIN) 100 MG capsule Take 1 capsule (100 mg total) by mouth 3 (three) times daily. 01/05/18   Pieter Partridge, DO  hydrOXYzine (VISTARIL) 25 MG capsule  Take 1 capsule (25 mg total) by mouth at bedtime. Patient not taking: Reported on 07/16/2017 01/18/17   Angiulli, Lavon Paganini, PA-C  metoprolol tartrate (LOPRESSOR) 25 MG tablet Take 1 tablet (25 mg total) by mouth 2 (two) times daily. Patient not taking: Reported on 01/05/2018 01/18/17   Angiulli, Lavon Paganini, PA-C  Multiple Vitamin (MULTIVITAMIN WITH MINERALS) TABS tablet Take 1 tablet by mouth daily as needed (when not eating well).    [provider]  NONFORMULARY OR COMPOUNDED ITEM Equal parts Nystatin 100,000 units/ml and viscous lidocaine 2%.  Swish 15 ml and spit q 6 hour prn disp 250 ml 0 RF Patient not taking: Reported on 05/25/2017 01/27/17   Charlett Blake, MD  oxyCODONE-acetaminophen (PERCOCET/ROXICET) 5-325 MG tablet Take 1-2 tablets by mouth every 8 (eight) hours as needed for severe pain. 06/27/19   Hayden Rasmussen, MD  polyethylene glycol [facility]  Floria Raveling) packet Take 17 g by mouth daily. Patient not taking: Reported on 07/16/2017 01/19/17   Angiulli, Lavon Paganini, PA-C  pravastatin (PRAVACHOL) 40 MG tablet Take 1 tablet (40 mg total) by mouth daily at 6 PM. Patient not taking: Reported on 05/25/2017 02/14/17   Angiulli, Lavon Paganini, PA-C  Red Yeast Rice Extract 600 MG CAPS Take 1 capsule by mouth daily.    [provider]  VITAMIN E PO Take 1 capsule by mouth daily.    [provider]  Zoledronic Acid (RECLAST IV) Inject into the vein. Yearly injection - last injection March 2018    [provider]    Family History Family History  Problem Relation Age of Onset  . Heart disease Sister   . Hypertension Sister   . Asthma Sister   . Breast cancer Maternal Aunt   . Ovarian cancer Paternal Aunt   . Colon cancer Neg Hx   . Esophageal cancer Neg Hx   . Pancreatic cancer Neg Hx   . Rectal cancer Neg Hx   . Stomach cancer Neg Hx     Social History Social History   Tobacco Use  . Smoking status: Former Smoker    Packs/day: 1.00    Years: 42.00    Pack years: 42.00    Types: Cigarettes    Quit date: 08/23/1977    Years since quitting: 41.9  . Smokeless tobacco: Never Used  Substance Use Topics  . Alcohol use: No  . Drug use: No     Allergies   Anectine [succinylcholine chloride], Dilaudid [hydromorphone hcl], Hydrocodone-acetaminophen, Iodine, Naproxen sodium, Oxycodone hcl, Pentosan polysulfate sodium, Pseudoephedrine, Succinylcholine, Tape, Tramadol hcl, and Shellfish allergy   Review of Systems Review of Systems  Neurological: Positive for syncope.  All other systems reviewed and are negative.    Physical Exam Updated Vital Signs BP (!) 163/71   Pulse 75   Temp 98.1 F (36.7 C) (Oral)   Resp 18   SpO2 95%   Physical Exam Vitals signs and nursing note reviewed.  Constitutional:      General: She is not in acute distress.    Appearance: She is well-developed.     Comments: Pleasantly  confused  HENT:     Head: Normocephalic and atraumatic.  Eyes:     Extraocular Movements: Extraocular movements intact.     Conjunctiva/sclera: Conjunctivae normal.     Pupils: Pupils are equal, round, and reactive to light.  Neck:     Musculoskeletal: Normal range of motion and neck supple.  Cardiovascular:     Rate and Rhythm:  Normal rate and regular rhythm.     Pulses: Normal pulses.  Pulmonary:     Effort: Pulmonary effort is normal. No respiratory distress.     Breath sounds: Normal breath sounds. No wheezing.  Abdominal:     General: There is no distension.     Palpations: Abdomen is soft. There is no mass.     Tenderness: There is no abdominal tenderness. There is no guarding or rebound.  Musculoskeletal: Normal range of motion.     Right lower leg: No edema.     Left lower leg: No edema.  Skin:    General: Skin is warm and dry.     Capillary Refill: Capillary refill takes less than 2 seconds.  Neurological:     Mental Status: She is alert and oriented to person, place, and time.     Comments: Alert to person and place, not time or event.  No focal neurologic deficits.  Grip strength intact as well.  Sensation intact x4.  Nose to finger intact.  No CN deficit.  Mild confusion.      ED Treatments / Results  Labs (all labs ordered are listed, but only abnormal results are displayed) Labs Reviewed  COMPREHENSIVE METABOLIC PANEL - Abnormal; Notable for the following components:      Result Value   Glucose, Bld 147 (*)    Total Protein 6.2 (*)    GFR calc non Af Amer 55 (*)    All other components within normal limits  SARS CORONAVIRUS 2 (TAT 6-24 HRS)  CBC WITH DIFFERENTIAL/PLATELET  URINALYSIS, ROUTINE W REFLEX MICROSCOPIC  TROPONIN I (HIGH SENSITIVITY)  TROPONIN I (HIGH SENSITIVITY)    EKG EKG Interpretation  Date/Time:  Tuesday July 17 2019 12:55:58 EST Ventricular Rate:  94 PR Interval:    QRS Duration: 137 QT Interval:  404 QTC Calculation: 506 R  Axis:   80 Text Interpretation: Sinus rhythm Probable left atrial enlargement Right bundle branch block similar to previous Confirmed by Theotis Burrow [phone]) on 07/17/2019 1:45:11 PM   Radiology Ct Head Wo Contrast  Result Date: 07/17/2019 CLINICAL DATA:  Syncopal episode EXAM: CT HEAD WITHOUT CONTRAST TECHNIQUE: Contiguous axial images were obtained from the base of the skull through the vertex without intravenous contrast. COMPARISON:  2018 FINDINGS: Brain: There is no acute intracranial hemorrhage, mass-effect, or edema. Gray-white differentiation is preserved. There is no extra-axial fluid collection. Ventricles and sulci are stable in size and configuration. Patchy and confluent areas of hypoattenuation in the supratentorial white matter are nonspecific but likely reflect stable chronic microvascular ischemic changes. There are chronic small vessel infarcts of the left thalamus and posterior to the left lentiform nucleus. Vascular: There is atherosclerotic calcification at the skull base. Skull: Calvarium is unremarkable. Sinuses/Orbits: No acute finding. Other: None. IMPRESSION: No acute intracranial hemorrhage, mass effect, or evidence of acute infarction. Stable chronic findings detailed above. Electronically Signed   By: Macy Mis M.D.   On: 07/17/2019 14:56    Procedures Procedures (including critical care time)  Medications Ordered in ED Medications - No data to display   Initial Impression / Assessment and Plan / ED Course  I have reviewed the triage vital signs and the nursing notes.  Pertinent labs & imaging results that were available during my care of the patient were reviewed by me and considered in my medical decision making (see chart for details).        Patient presenting for evaluation of transient altered mental status.  She had  an episode that lasted about 12 minutes in which she was unresponsive.  Due to her age, consider cardiac cause.  As patient had her  eyes open and had garbled speech before returning to normal, consider seizure.  Consider stroke versus TIA.  Will obtain labs, EKG, urine, and CT head for further evaluation.  EKG unchanged from previous.  Labs reassuring.  Troponin normal.  CT head negative for acute findings.  Case discussed with attending, Dr. Rex Kras evaluated the patient.  Will consult with neurology, patient will likely need to be admitted for syncope work-up.  Discussed with Dr. Leonel Ramsay from neurology, who recommends admission for MRI and EEG.  Neuro will evaluate the patient.  Discussed with family medicine who will admit the patient.   Final Clinical Impressions(s) / ED Diagnoses   Final diagnoses:  Syncope, unspecified syncope type    ED Discharge Orders    None       Franchot Heidelberg, PA-C 07/17/19 1618    Little, Wenda Overland, MD 07/19/19 2022

## 2019-07-17 NOTE — ED Notes (Signed)
ED TO INPATIENT HANDOFF REPORT  ED Nurse Name and Phone #: Lunette Stands [place] Name/Age/Gender [name] 85 y.o. female Room/Bed: 040C/040C  Code Status   Code Status: Full Code  Home/SNF/Other Home Patient oriented to: self, place, time and situation Is this baseline? Yes   Triage Complete: Triage complete  Chief Complaint syncopal episode  Triage Note Pt ambulated to bathroom alone at baseline, had syncopal episode on the toilet, lasting 12 mins before coming back to normal. Pt lowered to the ground by home health aide. Pt has no complaints. New RBBB on EMS EKG. Pt mildly confused in route. Hx stroke.    Allergies Allergies  Allergen Reactions  . Anectine [Succinylcholine Chloride] Other (See Comments)    Reaction:  Patient states that her body wasn't functioning.  Pt states she was put on life support.   . Dilaudid [Hydromorphone Hcl] Other (See Comments)    Slept for 3 days and 3 nights  . Hydrocodone-Acetaminophen Other (See Comments)    Hallucinations, paranoia, amnesia Pt takes Tylenol at home  . Iodine Hives and Other (See Comments)    , asthma  . Naproxen Sodium Other (See Comments)    REACTION: Asthma  . Oxycodone Hcl Other (See Comments)     Hallucinations, paranoia, amnesia Pt takes Percocet at home with no issues  . Pentosan Polysulfate Sodium Other (See Comments)    REACTION: unspecified per patient  . Pseudoephedrine Other (See Comments)     Palpitations  . Succinylcholine     REACTION: "Can't wake up".  . Tape Dermatitis    Red inflammed  . Tramadol Hcl Other (See Comments)    Unknown reaction  . Shellfish Allergy     Runny nose, cough, hoarseness    Level of Care/Admitting Diagnosis ED Disposition    ED Disposition Condition [facility] Area: [place] [[id number]]  Level of Care: Telemetry Medical [104]  Covid Evaluation: Asymptomatic Screening Protocol (No Symptoms)  Diagnosis: Syncope  [id number]  Admitting Physician: Marla Roe  Attending Physician: Andrena Mews T [[id number]]  Estimated length of stay: past midnight tomorrow  Certification:: I certify this patient will need inpatient services for at least 2 midnights  PT Class (Do Not Modify): Inpatient [101]  PT Acc Code (Do Not Modify): Private [1]       B Medical/Surgery History Past Medical History:  Diagnosis Date  . Adenomatous colon polyp 04/2005  . Allergic    Anectine  . ANXIETY 10/13/2007  . BACK PAIN 03/01/2007  . BREAST CANCER, HX OF 12/06/2007  . Cancer [facility])    RIGHT mastectomy with node dissection  . Complication of anesthesia    family aggergy to annectine  . Compression fracture of L4 lumbar vertebra 02/2013  . CONSTIPATION 01/06/2009  . DEPRESSION 10/13/2007  . Diverticular disease   . DIVERTICULOSIS, COLON 05/11/2005  . External hemorrhoids without mention of complication [id number]  . FATIGUE 08/07/2008  . HEMORRHOIDS, INTERNAL 05/11/2005  . Hypercalcemia 04/23/2009  . HYPERLIPIDEMIA NEC/NOS 03/01/2007  . HYPERTENSION 10/13/2007  . Impaired fasting glucose 11/14/2008  . INTERSTITIAL CYSTITIS 09/06/2007  . Irritable bowel syndrome 01/06/2009  . LIVER MASS 05/15/2009  . OSTEOPOROSIS 05/09/2009  . PARESTHESIA 09/06/2007  . POLYCYTHEMIA 04/23/2009  . STENOSIS, SPINAL, UNSPC REGION 03/01/2007  . Thyroid disease    Past Surgical History:  Procedure Laterality Date  . ABDOMINAL HYSTERECTOMY    . BACK SURGERY    . BREAST SURGERY  mastectomy -right  . CERVICAL SPINE SURGERY    . CHOLECYSTECTOMY    . COLONOSCOPY W/ BIOPSIES    . cortisone injection  10/16/12   left knee  . FIXATION KYPHOPLASTY LUMBAR SPINE    . HEMORRHOID BANDING  2015-16  . PARATHYROID EXPLORATION  06/29/2011   Procedure: PARATHYROID EXPLORATION;  Surgeon: Earnstine Regal, MD;  Location: [facility] ORS;  Service: General;  Laterality: Left;  Left Superior Parathyroidectomy  . PARATHYROIDECTOMY  06/29/11     A IV  Location/Drains/Wounds Patient Lines/Drains/Airways Status   Active Line/Drains/Airways    Name:   Placement date:   Placement time:   Site:   Days:   Peripheral IV 07/17/19 Left Antecubital   07/17/19    1217    Antecubital   less than 1          Intake/Output Last 24 hours No intake or output data in the 24 hours ending 07/17/19 1807  Labs/Imaging Results for orders placed or performed during the hospital encounter of 07/17/19 (from the past 48 hour(s))  CBC with Differential     Status: None   Collection Time: 07/17/19 12:32 PM  Result Value Ref Range   WBC 7.9 4.0 - 10.5 K/uL   RBC 4.66 3.87 - 5.11 MIL/uL   Hemoglobin 14.4 12.0 - 15.0 g/dL   HCT 44.2 36.0 - 46.0 %   MCV 94.8 80.0 - 100.0 fL   MCH 30.9 26.0 - 34.0 pg   MCHC 32.6 30.0 - 36.0 g/dL   RDW 11.6 11.5 - 15.5 %   Platelets 203 150 - 400 K/uL   nRBC 0.0 0.0 - 0.2 %   Neutrophils Relative % 62 %   Neutro Abs 4.9 1.7 - 7.7 K/uL   Lymphocytes Relative 30 %   Lymphs Abs 2.4 0.7 - 4.0 K/uL   Monocytes Relative 6 %   Monocytes Absolute 0.5 0.1 - 1.0 K/uL   Eosinophils Relative 1 %   Eosinophils Absolute 0.1 0.0 - 0.5 K/uL   Basophils Relative 1 %   Basophils Absolute 0.0 0.0 - 0.1 K/uL   Immature Granulocytes 0 %   Abs Immature Granulocytes 0.02 0.00 - 0.07 K/uL    Comment: Performed at [facility], [address] [place], [place] [address]  Comprehensive metabolic panel     Status: Abnormal   Collection Time: 07/17/19 12:32 PM  Result Value Ref Range   Sodium 139 135 - 145 mmol/L   Potassium 4.1 3.5 - 5.1 mmol/L   Chloride 100 98 - 111 mmol/L   CO2 29 22 - 32 mmol/L   Glucose, Bld 147 (H) 70 - 99 mg/dL   BUN 18 8 - 23 mg/dL   Creatinine, Ser 0.94 0.44 - 1.00 mg/dL   Calcium 9.1 8.9 - 10.3 mg/dL   Total Protein 6.2 (L) 6.5 - 8.1 g/dL   Albumin 3.5 3.5 - 5.0 g/dL   AST 25 15 - 41 U/L   ALT 24 0 - 44 U/L   Alkaline Phosphatase 107 38 - 126 U/L   Total Bilirubin 0.8 0.3 - 1.2 mg/dL   GFR calc non Af  Amer 55 (L) >60 mL/min   GFR calc Af Amer >60 >60 mL/min   Anion gap 10 5 - 15    Comment: Performed at [facility] [address] [place], [place] [address]  Troponin I (High Sensitivity)     Status: None   Collection Time: 07/17/19 12:32 PM  Result Value  Ref Range   Troponin I (High Sensitivity) 7 <18 ng/L    Comment: (NOTE) Elevated high sensitivity troponin I (hsTnI) values and significant  changes across serial measurements may suggest ACS but many other  chronic and acute conditions are known to elevate hsTnI results.  Refer to the "Links" section for chest pain algorithms and additional  guidance. Performed at [facility], [place] [address], [place], [place] [address]   Urinalysis, Routine w reflex microscopic     Status: None   Collection Time: 07/17/19  1:28 PM  Result Value Ref Range   Color, Urine YELLOW YELLOW   APPearance CLEAR CLEAR   Specific Gravity, Urine 1.013 1.005 - 1.030   pH 6.0 5.0 - 8.0   Glucose, UA NEGATIVE NEGATIVE mg/dL   Hgb urine dipstick NEGATIVE NEGATIVE   Bilirubin Urine NEGATIVE NEGATIVE   Ketones, ur NEGATIVE NEGATIVE mg/dL   Protein, ur NEGATIVE NEGATIVE mg/dL   Nitrite NEGATIVE NEGATIVE   Leukocytes,Ua NEGATIVE NEGATIVE    Comment: Performed at [facility] [address], [place], [place] [address]  Troponin I (High Sensitivity)     Status: None   Collection Time: 07/17/19  2:59 PM  Result Value Ref Range   Troponin I (High Sensitivity) 11 <18 ng/L    Comment: (NOTE) Elevated high sensitivity troponin I (hsTnI) values and significant  changes across serial measurements may suggest ACS but many other  chronic and acute conditions are known to elevate hsTnI results.  Refer to the "Links" section for chest pain algorithms and additional  guidance. Performed at [facility], [place] [address], [place], [place] [address]    Ct Head Wo Contrast  Result Date: 07/17/2019 CLINICAL DATA:  Syncopal episode EXAM: CT  HEAD WITHOUT CONTRAST TECHNIQUE: Contiguous axial images were obtained from the base of the skull through the vertex without intravenous contrast. COMPARISON:  2018 FINDINGS: Brain: There is no acute intracranial hemorrhage, mass-effect, or edema. Gray-white differentiation is preserved. There is no extra-axial fluid collection. Ventricles and sulci are stable in size and configuration. Patchy and confluent areas of hypoattenuation in the supratentorial white matter are nonspecific but likely reflect stable chronic microvascular ischemic changes. There are chronic small vessel infarcts of the left thalamus and posterior to the left lentiform nucleus. Vascular: There is atherosclerotic calcification at the skull base. Skull: Calvarium is unremarkable. Sinuses/Orbits: No acute finding. Other: None. IMPRESSION: No acute intracranial hemorrhage, mass effect, or evidence of acute infarction. Stable chronic findings detailed above. Electronically Signed   By: Macy Mis M.D.   On: 07/17/2019 14:56    Pending Labs Unresulted Labs (From admission, onward)    Start     Ordered   07/18/19 0500  Comprehensive metabolic panel  Tomorrow morning,   R     07/17/19 1714   07/18/19 0500  CBC WITH DIFFERENTIAL  Tomorrow morning,   R     07/17/19 1714   07/17/19 1504  SARS CORONAVIRUS 2 (TAT 6-24 HRS) Nasopharyngeal Nasopharyngeal Swab  (Asymptomatic/Tier 3)  Once,   STAT    Question Answer Comment  Is this test for diagnosis or screening Screening   Symptomatic for COVID-19 as defined by CDC No   Hospitalized for COVID-19 No   Admitted to ICU for COVID-19 No   Previously tested for COVID-19 Unknown   Resident in a congregate (group) care setting No   Employed in healthcare setting No   Pregnant No      07/17/19 1504  Vitals/Pain Today's Vitals   07/17/19 1600 07/17/19 1645 07/17/19 1715 07/17/19 1800  BP: (!) 158/61 122/76 (!) 149/106 (!) 117/102  Pulse:   91 (!) 105  Resp: 18 18 17 17    Temp:      TempSrc:      SpO2:   95% 95%  PainSc:        Isolation Precautions No active isolations  Medications Medications  enoxaparin (LOVENOX) injection 40 mg (has no administration in time range)  sodium chloride flush (NS) 0.9 % injection 3 mL (has no administration in time range)  diazepam (VALIUM) tablet 5 mg (has no administration in time range)    Mobility walks with device High fall risk   Focused Assessments Neuro Assessment Handoff:    Neuro Assessment: Exceptions to [facility] Neuro Checks:      Last Documented NIHSS Modified Score:   Has TPA been given? No If patient is a Neuro Trauma and patient is going to OR before floor call report to [place] nurse: [phone] or [phone]     R Recommendations: See Admitting Provider Note  Report given to:   Additional Notes: N/A

## 2019-07-17 NOTE — H&P (Addendum)
[facility] Admission History and Physical Service Pager: [phone]  Patient name: [name] Medical record number: [id number] Date of birth: [birth year] Age: 85 y.o. Gender: female  Primary Care Provider: Seward Carol, MD Consultants: Neuro Code Status: dnr/dni Preferred Emergency Contact:  tena harmon, [phone]  Chief Complaint:   Assessment and Plan: TORRIN URATA is a 85 y.o. female presenting with syncopal episode. PMH is significant for  h/o stroke, anxiety, PTSD, htn, hld, hyperparathyroidism(S/P resection), breast cancer, T-spine fracture, frequent falls  Syncope-like event Patient reports to the emergency room after possible syncopal event witnessed by caregiver.  Patient was seated using the restroom at the time.  She was unresponsive for approximately 12 minutes.  Caregiver called EMS who brought her to the emergency room.  In the emergency room she was altered but alert and responsive.  She had a head CT which showed no acute intracranial hemorrhage, mass-effect or evidence of acute infarction.EKG showed sinus rhythm with probable left atrial enlargement and right bundle branch block.  CBC and CMP were within normal limits other than an elevated glucose at 147 and low total protein at 6.2.  High-sensitivity troponins were trended at 7> 11.  Neurology was consulted.  Differential includes seizure vs vasovagal syncope vs CVA vs medication overdose. Patient with no prior history of seizure. Vasovagal is also possible due to the fact that patient was using the restroom when this all occurred. Her caregiver endorses a lot of straining during defecation while on the opioids.  Patient has no focal neurologic deficits making CVA is less likely but still needs to be evaluated. Given the benzo, muscle relaxer, and opioid the patient has been taking, medication induced syncope is high on the differential. - Admit to inpatient family medicine,  Inpatient, Dr. Gwendlyn Deutscher - vital signs per telemetry routine -Neuro consulted, recommendations appreciated -EEG -MRI brain without contrast -Titrating down or decreasing sedating medications -Seizure precautions -Vitals per routine -Continuous cardiac monitoring -AM EKG -Up with assistance -PT/OT eval and treat  Altered mental status Patient with reported altered mental status. Seeing things in the room that were not there. Unsure of the year. This apparently all started after the fall and subsequent t-spine fracture. There is mention of vertebrobasilar infarct in the past. Unclear if this represents worsening dementia, is medication induced, or represents intracranial pathology. Currently has soma, percocet, valium, neurontin, and celebrex as outpatient medications. - Valium 5 mg at night  - Holding all other sedatives at this time. - SLP to perform cog eval  Hypertension Home medication includes metoprolol twice daily.  Patient is reportedly not taking her medications for hypertension.  We will wait to rule out stroke with MRI before starting antihypertensives.  Patient may need permissive hypertension if stroke revealed.  Can start BP meds once MRI results -Monitor blood pressures -Allow for permissive hypertension until MRI results return  Frequent falls/T11 vertebrae fracture Patient's most frequent fall was 06/27/2019.  X-ray showed closed wedge compression fracture of T11 vertebrae.  Patient was discharged with Percocet 5/325 every 8 hours as needed.  Neuro was consulted who recommended TLSO brace as needed for comfort and following up with him in 6 weeks.  Patient is complaining of no back pain at this time -Holding Percocet at this time given altered mental status and no back pain -Continue to monitor for increasing pain  Anxiety/PTSD Reportedly has had PTSD after getting her leg run over back in 2009. Takes Valium 10mg  bid. Will decrease and try to  wean during admission. Will give  5mg  in the PM tonight and adjust dose as tolerated.   Hx of breast cancer s/p mastectomy  Mastectomy in 1981.  She did not require chemo or postoperative radiation.  They removed 23 lymph nodes and all were negative for malignancy.  Monitoring with outpatient oncology.  Hyperlipidemia Patient was prescribed statin.  Patient is noncompliant with statin.  History of hyperparathyroidism Adenoma resected by Dr. Armandina Gemma in 2012  FEN/GI: N.p.o. at this time Prophylaxis: Lovenox  Disposition: to tele, dispo per clinical course  History of Present Illness:  [name] is a 85 y.o. female presenting with syncope. The history is per the patient's caregiver.   At around 9:30am this morning. She walked to the restroom.  She told her caregiver that she had to have a bowel movement after urinating.  She was found a few seconds later by her caregiver slumped over and unresponsive, leaning forward.  Patient's eyes were wide open the entire time.  Patient was basically dead weight at that point per care giver. Was out for around 12 minutes per caregiver. Eventually responded with garbled noises. Took approximately 5 minutes to arouse. When eventually woke up was fairly aggressive and then gradually returned to herself.  Patient has never experienced this in the past.  Patient has no recollection of this events.  Patient's caregiver reports that patient has not had any of her medications this morning.  Work-up in the ED consisted of a CBC, CMP, troponin x2, UA, CT head without contrast, Covid PCR, EKG.  Work-up unrevealing at this point with all the above be negative for abnormality.  Review Of Systems: Per HPI with the following additions:   Review of Systems  Constitutional: Negative for chills and fever.  HENT: Negative.  Negative for congestion.   Eyes: Negative for blurred vision.  Respiratory: Negative for cough and shortness of breath.   Cardiovascular: Positive for leg swelling.  Negative for chest pain.  Gastrointestinal: Negative for abdominal pain, constipation, diarrhea, nausea and vomiting.  Genitourinary: Negative for dysuria and urgency.  Musculoskeletal: Positive for back pain. Negative for neck pain.  Neurological: Negative for dizziness, seizures and headaches.  Psychiatric/Behavioral: Negative for depression, hallucinations and suicidal ideas.       Patient denies hallucinations although she is seeing things while we are in the room.  Mainly different lights that were not there    Patient Active Problem List   Diagnosis Date Noted  . Cognitive deficit due to recent stroke 03/08/2017  . Alteration of sensation as late effect of stroke 03/08/2017  . Gait disturbance, post-stroke 03/08/2017  . Acute arterial ischemic stroke, vertebrobasilar, thalamic, left ([place]) 01/10/2017  . Neurologic gait disorder   . Depression   . Benign essential HTN   . History of breast cancer   . Stroke (cerebrum) ([place]) 01/08/2017  . TIA (transient ischemic attack) 12/16/2016  . Dysarthria 12/16/2016  . Leg weakness 12/16/2016  . EKG abnormalities 12/16/2016  . Chronic pain 12/16/2016  . Elevated blood pressure 12/22/2014  . Internal hemorrhoids with bleeding and prolapse 11/04/2014  . Lactose intolerance 11/04/2014  . Hemorrhoid 09/11/2014  . Rectal bleeding 09/11/2014  . IBS (irritable bowel syndrome) 09/25/2013  . Hyperparathyroidism, primary ([place]) 05/26/2011  . Osteoporosis 05/09/2009  . POLYCYTHEMIA 04/23/2009  . IMPAIRED FASTING GLUCOSE 11/14/2008  . BREAST CANCER, HX OF 12/06/2007  . Anxiety state 10/13/2007  . Essential hypertension 10/13/2007  . INTERSTITIAL CYSTITIS 09/06/2007  . HLD (hyperlipidemia) 03/01/2007  .  Backache 03/01/2007    Past Medical History: Past Medical History:  Diagnosis Date  . Adenomatous colon polyp 04/2005  . Allergic    Anectine  . ANXIETY 10/13/2007  . BACK PAIN 03/01/2007  . BREAST CANCER, HX OF 12/06/2007  . Cancer [facility])     RIGHT mastectomy with node dissection  . Complication of anesthesia    family aggergy to annectine  . Compression fracture of L4 lumbar vertebra 02/2013  . CONSTIPATION 01/06/2009  . DEPRESSION 10/13/2007  . Diverticular disease   . DIVERTICULOSIS, COLON 05/11/2005  . External hemorrhoids without mention of complication [id number]  . FATIGUE 08/07/2008  . HEMORRHOIDS, INTERNAL 05/11/2005  . Hypercalcemia 04/23/2009  . HYPERLIPIDEMIA NEC/NOS 03/01/2007  . HYPERTENSION 10/13/2007  . Impaired fasting glucose 11/14/2008  . INTERSTITIAL CYSTITIS 09/06/2007  . Irritable bowel syndrome 01/06/2009  . LIVER MASS 05/15/2009  . OSTEOPOROSIS 05/09/2009  . PARESTHESIA 09/06/2007  . POLYCYTHEMIA 04/23/2009  . STENOSIS, SPINAL, UNSPC REGION 03/01/2007  . Thyroid disease     Past Surgical History: Past Surgical History:  Procedure Laterality Date  . ABDOMINAL HYSTERECTOMY    . BACK SURGERY    . BREAST SURGERY     mastectomy -right  . CERVICAL SPINE SURGERY    . CHOLECYSTECTOMY    . COLONOSCOPY W/ BIOPSIES    . cortisone injection  10/16/12   left knee  . FIXATION KYPHOPLASTY LUMBAR SPINE    . HEMORRHOID BANDING  2015-16  . PARATHYROID EXPLORATION  06/29/2011   Procedure: PARATHYROID EXPLORATION;  Surgeon: Earnstine Regal, MD;  Location: [facility] ORS;  Service: General;  Laterality: Left;  Left Superior Parathyroidectomy  . PARATHYROIDECTOMY  06/29/11    Social History: Social History   Tobacco Use  . Smoking status: Former Smoker    Packs/day: 1.00    Years: 42.00    Pack years: 42.00    Types: Cigarettes    Quit date: 08/23/1977    Years since quitting: 41.9  . Smokeless tobacco: Never Used  Substance Use Topics  . Alcohol use: No  . Drug use: No   Additional social history: Please also refer to relevant sections of EMR.  Family History: Family History  Problem Relation Age of Onset  . Heart disease Sister   . Hypertension Sister   . Asthma Sister   . Breast cancer Maternal Aunt   . Ovarian  cancer Paternal Aunt   . Colon cancer Neg Hx   . Esophageal cancer Neg Hx   . Pancreatic cancer Neg Hx   . Rectal cancer Neg Hx   . Stomach cancer Neg Hx     Allergies and Medications: Allergies  Allergen Reactions  . Anectine [Succinylcholine Chloride] Other (See Comments)    Reaction:  Patient states that her body wasn't functioning.  Pt states she was put on life support.   . Dilaudid [Hydromorphone Hcl] Other (See Comments)    Slept for 3 days and 3 nights  . Hydrocodone-Acetaminophen Other (See Comments)    Hallucinations, paranoia, amnesia Pt takes Tylenol at home  . Iodine Hives and Other (See Comments)    , asthma  . Naproxen Sodium Other (See Comments)    REACTION: Asthma  . Oxycodone Hcl Other (See Comments)     Hallucinations, paranoia, amnesia Pt takes Percocet at home with no issues  . Pentosan Polysulfate Sodium Other (See Comments)    REACTION: unspecified per patient  . Pseudoephedrine Other (See Comments)     Palpitations  . Succinylcholine  REACTION: "Can't wake up".  . Tape Dermatitis    Red inflammed  . Tramadol Hcl Other (See Comments)    Unknown reaction  . Shellfish Allergy     Runny nose, cough, hoarseness   No current facility-administered medications on file prior to encounter.    Current Outpatient Medications on File Prior to Encounter  Medication Sig Dispense Refill  . acetaminophen (TYLENOL) 500 MG tablet Take 1,000 mg by mouth every 6 (six) hours as needed for mild pain.    Marland Kitchen Alum & Mag Hydroxide-Simeth (GELUSIL PO) Take 1 tablet by mouth daily as needed (acid reflux).    . Ascorbic Acid (VITAMIN C PO) Take 1 tablet by mouth daily as needed (when not eating enough fruit).    Marland Kitchen aspirin 81 MG chewable tablet Chew 1 tablet (81 mg total) by mouth daily. (Patient not taking: Reported on 12/08/2017) 30 tablet 0  . buPROPion (WELLBUTRIN XL) 300 MG 24 hr tablet Take 1 tablet (300 mg total) by mouth daily. 30 tablet 1  . CALCIUM-MAGNESIUM PO  Take 1 tablet by mouth daily.    . carisoprodol (SOMA) 250 MG tablet Take 250 mg by mouth 2 (two) times daily.    . carisoprodol (SOMA) 350 MG tablet Take 1 tablet (350 mg total) by mouth daily as needed (muscle spasms). 30 tablet 0  . celecoxib (CELEBREX) 200 MG capsule Take 1 capsule (200 mg total) by mouth daily as needed (arthritis pain). 30 capsule 0  . Cholecalciferol (VITAMIN D3) 1000 units CAPS Take 1 capsule (1,000 Units total) by mouth daily. 60 capsule 0  . clopidogrel (PLAVIX) 75 MG tablet Take 1 tablet (75 mg total) by mouth daily. (Patient not taking: Reported on 12/08/2017) 30 tablet 1  . Cyanocobalamin (VITAMIN B-12 PO) Take 1 tablet by mouth daily.    . diazepam (VALIUM) 10 MG tablet Take 10 mg by mouth 2 (two) times daily as needed for anxiety.     . diclofenac sodium (VOLTAREN) 1 % GEL Apply 2 g topically 4 (four) times daily. (Patient not taking: Reported on 12/08/2017) 1 Tube 0  . estradiol (ESTRACE) 0.1 MG/GM vaginal cream Place 1 Applicatorful vaginally daily as needed (for dryness).     . fluticasone (FLONASE) 50 MCG/ACT nasal spray Place 2 sprays into both nostrils daily. (Patient taking differently: Place 2 sprays into both nostrils daily as needed for allergies. ) 16 g 2  . gabapentin (NEURONTIN) 100 MG capsule Take 1 capsule (100 mg total) by mouth 3 (three) times daily. 30 capsule 2  . hydrOXYzine (VISTARIL) 25 MG capsule Take 1 capsule (25 mg total) by mouth at bedtime. (Patient not taking: Reported on 07/16/2017) 30 capsule 0  . metoprolol tartrate (LOPRESSOR) 25 MG tablet Take 1 tablet (25 mg total) by mouth 2 (two) times daily. (Patient not taking: Reported on 01/05/2018) 60 tablet 1  . Multiple Vitamin (MULTIVITAMIN WITH MINERALS) TABS tablet Take 1 tablet by mouth daily as needed (when not eating well).    . NONFORMULARY OR COMPOUNDED ITEM Equal parts Nystatin 100,000 units/ml and viscous lidocaine 2%.  Swish 15 ml and spit q 6 hour prn disp 250 ml 0 RF (Patient not  taking: Reported on 05/25/2017) 250 each 0  . oxyCODONE-acetaminophen (PERCOCET/ROXICET) 5-325 MG tablet Take 1-2 tablets by mouth every 8 (eight) hours as needed for severe pain. 12 tablet 0  . polyethylene glycol (MIRALAX / GLYCOLAX) packet Take 17 g by mouth daily. (Patient not taking: Reported on 07/16/2017) 14 each 0  .  pravastatin (PRAVACHOL) 40 MG tablet Take 1 tablet (40 mg total) by mouth daily at 6 PM. (Patient not taking: Reported on 05/25/2017) 30 tablet 0  . Red Yeast Rice Extract 600 MG CAPS Take 1 capsule by mouth daily.    Marland Kitchen VITAMIN E PO Take 1 capsule by mouth daily.    . Zoledronic Acid (RECLAST IV) Inject into the vein. Yearly injection - last injection March 2018      Objective: BP (!) 163/71   Pulse 75   Temp 98.1 F (36.7 C) (Oral)   Resp 18   SpO2 95%  Physical Exam  Constitutional: She is well-developed, well-nourished, and in no distress. No distress.  HENT:  Head: Normocephalic and atraumatic.  Mouth/Throat: Oropharynx is clear and moist.  Eyes: Pupils are equal, round, and reactive to light. Conjunctivae and EOM are normal. Right eye exhibits no discharge. Left eye exhibits no discharge.  Neck: Normal range of motion. Neck supple.  Cardiovascular: Normal rate, regular rhythm, normal heart sounds and intact distal pulses. Exam reveals no gallop and no friction rub.  No murmur heard. Pulmonary/Chest: Effort normal and breath sounds normal. No respiratory distress.  Abdominal: Soft. Bowel sounds are normal. She exhibits no distension. There is no abdominal tenderness.  Musculoskeletal:        General: No tenderness, deformity or edema.  Lymphadenopathy:    She has no cervical adenopathy.  Neurological: She is alert. No cranial nerve deficit. Coordination normal.  Patient is oriented to person and place but not time.  Patient reports it is November, 1920.  Patient is unsure on the day  Skin: Skin is warm and dry. She is not diaphoretic.  Psychiatric:  Patient  is very pleasant to interact with but is mildly confused.  Patient is actively hallucinating when we are in the room.  She would point at things and say "see the green light".  The caregiver also has a video on her phone of the patient in the ED room pointing and other things asking if she sees those lights.    Labs and Imaging: CBC BMET  Recent Labs  Lab 07/17/19 1232  WBC 7.9  HGB 14.4  HCT 44.2  PLT 203   Recent Labs  Lab 07/17/19 1232  NA 139  K 4.1  CL 100  CO2 29  BUN 18  CREATININE 0.94  GLUCOSE 147*  CALCIUM 9.1     UA -pH-6.0 -Specific gravity-1.013  Troponin (high-sensitivity)-7 EKG: Sinus rhythm with probable left atrial enlargement and right bundle branch block  Ct Head Wo Contrast  Result Date: 07/17/2019 CLINICAL DATA:  Syncopal episode EXAM: CT HEAD WITHOUT CONTRAST TECHNIQUE: Contiguous axial images were obtained from the base of the skull through the vertex without intravenous contrast. COMPARISON:  2018 FINDINGS: Brain: There is no acute intracranial hemorrhage, mass-effect, or edema. Gray-white differentiation is preserved. There is no extra-axial fluid collection. Ventricles and sulci are stable in size and configuration. Patchy and confluent areas of hypoattenuation in the supratentorial white matter are nonspecific but likely reflect stable chronic microvascular ischemic changes. There are chronic small vessel infarcts of the left thalamus and posterior to the left lentiform nucleus. Vascular: There is atherosclerotic calcification at the skull base. Skull: Calvarium is unremarkable. Sinuses/Orbits: No acute finding. Other: None. IMPRESSION: No acute intracranial hemorrhage, mass effect, or evidence of acute infarction. Stable chronic findings detailed above. Electronically Signed   By: Macy Mis M.D.   On: 07/17/2019 14:56    Gifford Shave, MD 07/17/2019,  3:38 PM PGY-1, [facility] pager: [phone], text pages  welcome ------------------------------------------------------------------------------------------- Upper Level Addendum: I have seen and evaluated this patient along with Dr. Caron Presume and reviewed the above note, making necessary revisions in blue.  Guadalupe Dawn MD PGY-3 Family Medicine Resident

## 2019-07-17 NOTE — ED Notes (Signed)
Patient transported to CT 

## 2019-07-17 NOTE — ED Triage Notes (Signed)
Pt ambulated to bathroom alone at baseline, had syncopal episode on the toilet, lasting 12 mins before coming back to normal. Pt lowered to the ground by home health aide. Pt has no complaints. New RBBB on EMS EKG. Pt mildly confused in route. Hx stroke.

## 2019-07-17 NOTE — ED Notes (Signed)
Pt currently in MRI; will be transported upstairs to 3W upon arrival

## 2019-07-18 ENCOUNTER — Inpatient Hospital Stay (HOSPITAL_COMMUNITY): Payer: Medicare Other

## 2019-07-18 ENCOUNTER — Encounter (HOSPITAL_COMMUNITY): Payer: Self-pay | Admitting: *Deleted

## 2019-07-18 LAB — CBC WITH DIFFERENTIAL/PLATELET
Abs Immature Granulocytes: 0.03 10*3/uL (ref 0.00–0.07)
Basophils Absolute: 0 10*3/uL (ref 0.0–0.1)
Basophils Relative: 0 %
Eosinophils Absolute: 0.1 10*3/uL (ref 0.0–0.5)
Eosinophils Relative: 1 %
HCT: 45.6 % (ref 36.0–46.0)
Hemoglobin: 15.4 g/dL — ABNORMAL HIGH (ref 12.0–15.0)
Immature Granulocytes: 0 %
Lymphocytes Relative: 22 %
Lymphs Abs: 2.1 10*3/uL (ref 0.7–4.0)
MCH: 31.1 pg (ref 26.0–34.0)
MCHC: 33.8 g/dL (ref 30.0–36.0)
MCV: 92.1 fL (ref 80.0–100.0)
Monocytes Absolute: 0.6 10*3/uL (ref 0.1–1.0)
Monocytes Relative: 6 %
Neutro Abs: 7 10*3/uL (ref 1.7–7.7)
Neutrophils Relative %: 71 %
Platelets: 224 10*3/uL (ref 150–400)
RBC: 4.95 MIL/uL (ref 3.87–5.11)
RDW: 11.5 % (ref 11.5–15.5)
WBC: 9.9 10*3/uL (ref 4.0–10.5)
nRBC: 0 % (ref 0.0–0.2)

## 2019-07-18 LAB — TSH: TSH: 1.048 u[IU]/mL (ref 0.350–4.500)

## 2019-07-18 LAB — COMPREHENSIVE METABOLIC PANEL
ALT: 27 U/L (ref 0–44)
AST: 24 U/L (ref 15–41)
Albumin: 3.7 g/dL (ref 3.5–5.0)
Alkaline Phosphatase: 114 U/L (ref 38–126)
Anion gap: 10 (ref 5–15)
BUN: 12 mg/dL (ref 8–23)
CO2: 28 mmol/L (ref 22–32)
Calcium: 9.3 mg/dL (ref 8.9–10.3)
Chloride: 103 mmol/L (ref 98–111)
Creatinine, Ser: 0.89 mg/dL (ref 0.44–1.00)
GFR calc Af Amer: >60 mL/min (ref >60)
GFR calc non Af Amer: 58 mL/min — ABNORMAL LOW (ref >60)
Glucose, Bld: 149 mg/dL — ABNORMAL HIGH (ref 70–99)
Potassium: 3.7 mmol/L (ref 3.5–5.1)
Sodium: 141 mmol/L (ref 135–145)
Total Bilirubin: 1.1 mg/dL (ref 0.3–1.2)
Total Protein: 6.7 g/dL (ref 6.5–8.1)

## 2019-07-18 LAB — GLUCOSE, CAPILLARY: Glucose-Capillary: 158 mg/dL — ABNORMAL HIGH (ref 70–99)

## 2019-07-18 LAB — HEMOGLOBIN A1C
Hgb A1c MFr Bld: 5.9 % — ABNORMAL HIGH (ref 4.8–5.6)
Mean Plasma Glucose: 122.63 mg/dL

## 2019-07-18 MED ORDER — OXYCODONE-ACETAMINOPHEN 5-325 MG PO TABS
1.0000 | ORAL_TABLET | Freq: Three times a day (TID) | ORAL | Status: DC
  Administered 2019-07-18 – 2019-07-22 (×9): 1 via ORAL
  Filled 2019-07-18 (×12): qty 1

## 2019-07-18 MED ORDER — LORAZEPAM 2 MG/ML IJ SOLN
2.0000 mg | Freq: Once | INTRAMUSCULAR | Status: AC
  Administered 2019-07-18: 2 mg via INTRAVENOUS
  Filled 2019-07-18: qty 1

## 2019-07-18 MED ORDER — DIAZEPAM 5 MG PO TABS
5.0000 mg | ORAL_TABLET | Freq: Two times a day (BID) | ORAL | Status: DC
  Administered 2019-07-18 – 2019-07-19 (×2): 5 mg via ORAL
  Filled 2019-07-18 (×2): qty 1

## 2019-07-18 MED ORDER — AMLODIPINE BESYLATE 5 MG PO TABS
5.0000 mg | ORAL_TABLET | Freq: Every day | ORAL | Status: DC
  Administered 2019-07-19: 5 mg via ORAL
  Filled 2019-07-18 (×2): qty 1

## 2019-07-18 MED ORDER — DIAZEPAM 5 MG PO TABS: 5.0000 mg | ORAL_TABLET | Freq: Two times a day (BID) | ORAL | Status: DC

## 2019-07-18 MED ORDER — OXYCODONE-ACETAMINOPHEN 5-325 MG PO TABS: 1.0000 | ORAL_TABLET | Freq: Three times a day (TID) | ORAL | Status: DC | PRN

## 2019-07-18 MED ORDER — DIAZEPAM 5 MG PO TABS: 5.0000 mg | ORAL_TABLET | Freq: Four times a day (QID) | ORAL | Status: DC

## 2019-07-18 NOTE — Progress Notes (Signed)
Patient continues to be argumentative and refusing medical care.  Patient without mittens or any other restrictive equipment. No sedative medications given, reserving for EEG as ordered by neurology.  Patient will not consent to echocardiogram. Refuses to answer orientation questions.  Safety sitter order placed, but no sitters available throughout the health system. Patient is not a candidate for tele sitter due to inability to redirect. Frequent visual and verbal contact from staff for safety at this time.  Wendee Copp

## 2019-07-18 NOTE — Progress Notes (Signed)
Unable to do EEG even with 2 mg of Ativan; nurse and EEG both attempted to calm patient. Very agitated and combative. Notified Dr Leonel Ramsay.

## 2019-07-18 NOTE — Evaluation (Signed)
Speech Language Pathology Evaluation Patient Details Name: [name] MRN: [id number] DOB: [birth year] Today's Date: 07/18/2019 Time: 0850-0908 SLP Time Calculation (min) (ACUTE ONLY): 18 min  Problem List:  Patient Active Problem List   Diagnosis Date Noted  . Syncope 07/17/2019  . Cognitive deficit due to recent stroke 03/08/2017  . Alteration of sensation as late effect of stroke 03/08/2017  . Gait disturbance, post-stroke 03/08/2017  . Acute arterial ischemic stroke, vertebrobasilar, thalamic, left ([place]) 01/10/2017  . Neurologic gait disorder   . Depression   . Benign essential HTN   . History of breast cancer   . Stroke (cerebrum) ([place]) 01/08/2017  . TIA (transient ischemic attack) 12/16/2016  . Dysarthria 12/16/2016  . Leg weakness 12/16/2016  . EKG abnormalities 12/16/2016  . Chronic pain 12/16/2016  . Elevated blood pressure 12/22/2014  . Internal hemorrhoids with bleeding and prolapse 11/04/2014  . Lactose intolerance 11/04/2014  . Hemorrhoid 09/11/2014  . Rectal bleeding 09/11/2014  . IBS (irritable bowel syndrome) 09/25/2013  . Hyperparathyroidism, primary ([place]) 05/26/2011  . Osteoporosis 05/09/2009  . POLYCYTHEMIA 04/23/2009  . IMPAIRED FASTING GLUCOSE 11/14/2008  . BREAST CANCER, HX OF 12/06/2007  . Anxiety state 10/13/2007  . Essential hypertension 10/13/2007  . INTERSTITIAL CYSTITIS 09/06/2007  . HLD (hyperlipidemia) 03/01/2007  . Backache 03/01/2007   Past Medical History:  Past Medical History:  Diagnosis Date  . Adenomatous colon polyp 04/2005  . Allergic    Ane[place]ine  . ANXIETY 10/13/2007  . BACK PAIN 03/01/2007  . BREAST CANCER, HX OF 12/06/2007  . Cancer [facility])    RIGHT mastectomy with node dissection  . Complication of anesthesia    family aggergy to annectine  . Compression fracture of L4 lumbar vertebra 02/2013  . CONSTIPATION 01/06/2009  . DEPRESSION 10/13/2007  . Diverticular disease   . DIVERTICULOSIS, COLON 05/11/2005  . External  hemorrhoids without mention of complication [id number]  . FATIGUE 08/07/2008  . HEMORRHOIDS, INTERNAL 05/11/2005  . Hypercalcemia 04/23/2009  . HYPERLIPIDEMIA NEC/NOS 03/01/2007  . HYPERTENSION 10/13/2007  . Impaired fasting glucose 11/14/2008  . INTERSTITIAL CYSTITIS 09/06/2007  . Irritable bowel syndrome 01/06/2009  . LIVER MASS 05/15/2009  . OSTEOPOROSIS 05/09/2009  . PARESTHESIA 09/06/2007  . POLYCYTHEMIA 04/23/2009  . STENOSIS, SPINAL, UNSPC REGION 03/01/2007  . Thyroid disease    Past Surgical History:  Past Surgical History:  Procedure Laterality Date  . ABDOMINAL HYSTERECTOMY    . BACK SURGERY    . BREAST SURGERY     mastectomy -right  . CERVICAL SPINE SURGERY    . CHOLECYSTECTOMY    . COLONOSCOPY W/ BIOPSIES    . cortisone injection  10/16/12   left knee  . FIXATION KYPHOPLASTY LUMBAR SPINE    . HEMORRHOID BANDING  2015-16  . PARATHYROID EXPLORATION  06/29/2011   Procedure: PARATHYROID EXPLORATION;  Surgeon: Earnstine Regal, MD;  Location: [facility];  Service: General;  Laterality: Left;  Left Superior Parathyroidectomy  . PARATHYROIDECTOMY  06/29/11   HPI:  [name] is a 85 y.o. female with significant past medical history of spinal stenosis, polycythemia, paresthesias, irritable bowel syndrome, hypertension-with common blood pressure systolically being in the 200., hyperlipidemia, fatigue, depression, constipation, breast cancer and per caregiver has recently been showing significant sundowning, neurocognitive decline and hallucinations.  MRI on 11/24 reported: "No acute intracranial abnormality. 2. Advanced chronic ischemic microangiopathy and generalized atrophy without lobar predilection."  Pt with hx of cognitive-linguistic deficits and she was seen for outpatient ST in 2018.    Assessment /  Plan / Recommendation Clinical Impression  Pt was seen for a cognitive-linguistic evaluation in the setting of a syncopal event.  Pt was encountered awake/alert with bilateral mitts in  place.  Pt with a hx of cognitive-linguistic deficits and she was seen for outpatient Speech Therapy in 2018.  Pt is a poor historian; however, she reported that she has a caretaker who assists her with IADLs (medications, finances, etc.) at baseline.  Pt presents with cognitive-linguistic deficits in expressive/receptive language, attention, memory, safety judgement, and executive functioning.  She was intermittently agitated throughout this evaluation, particularly when being asked basic questions such as orientation or yes/no response.  Pt was not oriented to place, year, or month.  She was having hallucinations and was seeing things that were not present in the room such as vegetables and spices.  Pt followed 1/5 basic one-step commands given verbal cues.  She exhibited semantic and phonemic paraphasias during confrontational naming tasks.  Pt additionally presented with neologisms and confabulations during conversational speech and she required frequent re-direction to task.  Suspect that baseline cognitive-linguistic deficits are being exacerbated by current hospitalization.  Therefore, no further skilled acute ST is warranted at this time.  Recommend 24/7 supervision and assistance with IADLs at time of discharge.      SLP Assessment  SLP Recommendation/Assessment: All further Speech Lanaguage Pathology  needs can be addressed in the next venue of care SLP Visit Diagnosis: Cognitive communication deficit (R41.841)    Follow Up Recommendations  24 hour supervision/assistance;Skilled Nursing facility    Frequency and Duration           SLP Evaluation Cognition  Overall Cognitive Status: History of cognitive impairments - at baseline Arousal/Alertness: Awake/alert Orientation Level: Disoriented X4 Attention: Focused;Sustained Focused Attention: Impaired Focused Attention Impairment: Verbal basic Sustained Attention: Impaired Sustained Attention Impairment: Verbal basic Memory:  Impaired Memory Impairment: Decreased short term memory;Decreased recall of new information Decreased Short Term Memory: Verbal basic Awareness: Impaired Problem Solving: Impaired Problem Solving Impairment: Verbal basic;Functional basic Executive Function: Sequencing;Decision Making Sequencing: Impaired Sequencing Impairment: Verbal basic Organizing: Impaired Organizing Impairment: Verbal basic Decision Making: Impaired Decision Making Impairment: Functional basic Behaviors: Impulsive;Restless;Verbal agitation;Physical agitation;Perseveration;Confabulation Safety/Judgment: Impaired       Comprehension  Auditory Comprehension Overall Auditory Comprehension: Impaired Yes/No Questions: Impaired Basic Biographical Questions: 26-50% accurate Commands: Impaired One Step Basic Commands: 0-24% accurate Conversation: Simple Interfering Components: Attention;Processing speed;Working Marine scientist Reading Comprehension Reading Status: Not tested    Expression Expression Primary Mode of Expression: Verbal Verbal Expression Overall Verbal Expression: Impaired Naming: Impairment Responsive: Not tested Confrontation: Impaired Convergent: 25-49% accurate Verbal Errors: Confabulation;Perseveration;Neologisms;Semantic paraphasias;Phonemic paraphasias;Not aware of errors Written Expression Written Expression: Not tested   Oral / Motor  Oral Motor/Sensory Function Overall Oral Motor/Sensory Function: (Unable to evaluate) Motor Speech Overall Motor Speech: Impaired at baseline Intelligibility: Intelligibility reduced Word: 75-100% accurate Phrase: 50-74% accurate Sentence: 50-74% accurate Conversation: 50-74% accurate   GO                   Colin Mulders., M.S., CCC-SLP Acute Rehabilitation Services Office: [phone]  [place] 07/18/2019, 9:30 AM

## 2019-07-18 NOTE — Progress Notes (Signed)
Family Medicine Teaching Service Daily Progress Note Intern Pager: [phone]  Patient name: [name] Medical record number: [id number] Date of birth: 1930/04/02 Age: 85 y.o. Gender: female  Primary Care Provider: Seward Carol, MD Consultants: None  Code Status: DNR  Pt Overview and Major Events to Date:  07/17/19: Admitted    Assessment and Plan: AL [name] is a 85 y.o. female presenting with syncopal episode. PMH is significant for  h/o stroke, anxiety, PTSD, htn, hld, hyperparathyroidism(S/P resection), breast cancer, T-spine fracture, frequent falls   Syncope-like event vs Seizure Patient presented from home after caregiver found her to be unresponsive for 12 minutes in the bathroom.  CT head and MRI were grossly unremarkable.  Neurology suggested is unlikely syncopal event as patient was unresponsive for 12 minutes and this presentation most likely with seizure versus dementia.  Recommend obtaining EEG.  Etiology of patient's confusion remains unclear and could potentially be multifactorial including polypharmacy, syncopal event, seizure, dementia.  -Neuro following, recommendations appreciated -Follow-up EEG -Continue titrating sedating medications -Seizure precautions -Vitals per routine -Continuous cardiac monitoring -PT/OT eval and treat  Altered mental status Patient altered this morning. Altered mental status likely multifactorial.  Patient prescribed multiple [place] list outpatient medications (soma (chronic back pain), percocet (chronic pain), valium (anxiety), neurontin, and celebrex). Will contact patient's physician who prescribes this medication to better elucidate indications. She was found in restraints, mittens and with four bed rails raised.  Asked nursing staff to remove patient's restraints as they were not authorized.  Additionally, patient likely has dementia.  Will ask SLP to perform MoCA. Continue to tolerate patient sedating medications. -  Valium 5 mg BID  - Percocet 5 mg 3 times daily PRN  - Holding all other sedatives at this time. - SLP to perform MoCA  Hypertension Patient is reportedly not taking medication for hypertension.    Patient hypertensive today 180/98, BP range 117/100 -196/86. -Monitor blood pressures -Amlodipine 5 mg daily  Frequent falls/T11 vertebrae fracture Patient's most frequent fall was 06/27/2019.  X-ray showed closed wedge compression fracture of T11 vertebrae.  Patient was discharged with Percocet 5/325 every 8 hours as needed.  Neuro was consulted who recommended TLSO brace as needed for comfort and following up with him in 6 weeks.  Patient is complaining of no back pain at this time -Holding Percocet at this time given altered mental status and no back pain -Continue to monitor for increasing pain  Anxiety/PTSD Reportedly has had PTSD after getting her leg run over back in 2009. Takes Valium 10mg  bid. Will decrease and try to wean during admission. Will give 5mg  in the PM tonight and adjust dose as tolerated.   Hx of breast cancer s/p mastectomy  Mastectomy in 1981.  She did not require chemo or postoperative radiation.  They removed 23 lymph nodes and all were negative for malignancy.  Monitoring with outpatient oncology.  Hyperlipidemia Patient was prescribed statin.  Patient is noncompliant with statin.  History of hyperparathyroidism Adenoma resected by Dr. Armandina Gemma in 2012  FEN/GI: Regular diet, replete electrolytes as needed PPx: Lovenox  Disposition: SNF versus 24-hour care  Subjective:  [name] was agitated and sundowning overnight.  Objective: Temp:  [97.8 F (36.6 C)-98.7 F (37.1 C)] 97.8 F (36.6 C) (11/25 0721) Pulse Rate:  [75-115] 115 (11/25 0721) Resp:  [16-26] 18 (11/24 2033) BP: (117-196)/(55-106) 180/98 (11/25 0721) SpO2:  [92 %-95 %] 93 % (11/24 2033) Weight:  [69.6 kg-71.5 kg] 69.6 kg (11/25 0454)  Physical Exam: General: Agitated  elderly female, in restraints Cardiovascular: Regular rate and rhythm, brisk cap refill Respiratory: No increased work of breathing, lungs clear auscultation bilaterally Abdomen: Soft, nontender, nondistended, no palpable masses Extremities: No lower extremity edema  Laboratory: Recent Labs  Lab 07/17/19 1232 07/18/19 0241  WBC 7.9 9.9  HGB 14.4 15.4*  HCT 44.2 45.6  PLT 203 224   Recent Labs  Lab 07/17/19 1232 07/18/19 0241  NA 139 141  K 4.1 3.7  CL 100 103  CO2 29 28  BUN 18 12  CREATININE 0.94 0.89  CALCIUM 9.1 9.3  PROT 6.2* 6.7  BILITOT 0.8 1.1  ALKPHOS 107 114  ALT 24 27  AST 25 24  GLUCOSE 147* 149*     Imaging/Diagnostic Tests: Mr Brain Wo Contrast  Result Date: 07/17/2019 CLINICAL DATA:  Encephalopathy with syncopal episode EXAM: MRI HEAD WITHOUT CONTRAST TECHNIQUE: Multiplanar, multiecho pulse sequences of the brain and surrounding structures were obtained without intravenous contrast. COMPARISON:  Head CT 07/17/2019, brain MRI 01/08/2017 FINDINGS: Brain: There is no acute infarct, acute hemorrhage or extra-axial collection. Diffuse confluent hyperintense T2-weighted signal within the periventricular, deep and juxtacortical white matter, most commonly due to chronic ischemic microangiopathy. There is generalized atrophy without lobar predilection. Blood-sensitive sequences show no chronic microhemorrhage or superficial siderosis. The midline structures are normal. Vascular: Normal flow voids Skull and upper cervical spine: The bone marrow signal of the cranium and upper cervical vertebrae is normal. There is no skull base lesion. The visualized upper cervical spinal cord is normal. Sinuses/Orbits: There is no paranasal sinus fluid level or advanced mucosal thickening. There is no mastoid or middle ear effusion. The orbits are normal. Other: None IMPRESSION: 1. No acute intracranial abnormality. 2. Advanced chronic ischemic microangiopathy and generalized atrophy  without lobar predilection. Electronically Signed   By: Ulyses Jarred M.D.   On: 07/17/2019 19:59     Lyndee Hensen, MD 07/18/2019, 8:34 AM PGY-1, [place] Intern pager: [phone], text pages welcome

## 2019-07-18 NOTE — Procedures (Signed)
Patient Name: [name]  MRN: [id number]  Epilepsy Attending: Lora Havens  Referring Physician/Provider: Dr. Guadalupe Dawn Date: 07/18/2019 Duration: 23.11 minutes  Patient history: 85 year old female with syncope-like episode.  EEG to evaluate for seizures.  Level of alertness: asleep  AEDs during EEG study: Valium  Technical aspects: This EEG study was done with scalp electrodes positioned according to the 10-20 International system of electrode placement. Electrical activity was acquired at a sampling rate of 500Hz  and reviewed with a high frequency filter of 70Hz  and a low frequency filter of 1Hz . EEG data were recorded continuously and digitally stored.   Description: During awake state, no clear posterior dominant rhythm was seen.  Sleep was characterized by polymorphic generalized 3 to 5 Hz theta-delta slowing.  EEG showed mixed frequencies with 3 to 5 Hz generalized theta-delta slowing admixed with 13 to 15 Hz generalized beta activity.  Photic stimulation and hyperventilation were not performed.  Abnormality -Continuous slow, generalized  IMPRESSION: This study is suggestive of mild to moderate diffuse encephalopathy, nonspecific to etiology. No seizures or definite epileptiform discharges were seen throughout the recording.  Aalijah Mims Barbra Sarks

## 2019-07-18 NOTE — Progress Notes (Addendum)
Pt's caretaker Carolynn Serve brought in patient's medication.  Reviewed pill bottles filled by [facility] in [place]: Celebrex 200 mg daily, Premarin 0.3 mg daily, Percocet 10 mg q6H, Soma 250 mg daily, Valium 10 mg daily.  Cystex OTC tablet.  Adjusted patient's Valium and Percocet. Continuing to [employer].   Lyndee Hensen, DO PGY-1, [facility] 07/18/2019 6:49 PM

## 2019-07-18 NOTE — Evaluation (Addendum)
Physical Therapy Evaluation Patient Details Name: [name] MRN: [id number] DOB: 06-Oct-1929 Today's Date: 07/18/2019   History of Present Illness  85 Y/O F with PMX of HLD, HTN, Dementia, Hx of Breast CA, Primary hyperthyroidism, TIA, CVA, Depression, brought in for unresponsiveness for a few minutes. According to her caregiver by her bedside, she did not lose consciousness.  Clinical Impression  Pt admitted with above diagnosis. PT evaluation limited by pt agitation, likely from being in an unfamiliar environment. Pt requiring two person maximal assist to transfer onto a bedside commode; she is unable to ambulate. Pt with decreased command following and resistive to therapist attempts to assist. Pt presents as a high fall risk based on balance impairments and decreased safety awareness.  Pt currently with functional limitations due to the deficits listed below (see PT Problem List). Pt will benefit from skilled PT to increase their independence and safety with mobility to allow discharge to the venue listed below.       Follow Up Recommendations SNF;Supervision/Assistance - 24 hour    Equipment Recommendations  None recommended by PT    Recommendations for Other Services       Precautions / Restrictions Precautions Precautions: Fall Restrictions Weight Bearing Restrictions: No      Mobility  Bed Mobility Overal bed mobility: Needs Assistance Bed Mobility: Sit to Supine;Supine to Sit     Supine to sit: Max assist;+2 for physical assistance;+2 for safety/equipment;Total assist Sit to supine: Max assist;+2 for physical assistance;+2 for safety/equipment;Total assist   General bed mobility comments: pt resistive to instruction from therapists, required maxA-totalA for bed mobility   Transfers Overall transfer level: Needs assistance Equipment used: None Transfers: Sit to/from Stand;Stand Pivot Transfers Sit to Stand: Max assist;+2 physical assistance;+2  safety/equipment Stand pivot transfers: +2 physical assistance;+2 safety/equipment;Max assist;Total assist       General transfer comment: maxA+2 for powerup and support in standing, maxA+2 for stand-pivot from EOB to [facility], required maxA+2 from [facility] progressing to totalA+2 to pivot and return to EOB, pt resistive and not following commands during session  Ambulation/Gait                Stairs            Wheelchair Mobility    Modified Rankin (Stroke Patients Only)       Balance Overall balance assessment: Needs assistance Sitting-balance support: Bilateral upper extremity supported;Feet supported Sitting balance-Leahy Scale: Poor Sitting balance - Comments: requires therapist support to maintain upright balance Postural control: Posterior lean Standing balance support: Bilateral upper extremity supported;During functional activity Standing balance-Leahy Scale: Zero Standing balance comment: maxA+2 for support in standing                             Pertinent Vitals/Pain Pain Assessment: Faces Faces Pain Scale: No hurt Pain Intervention(s): Monitored during session    Home Living Family/patient expects to be discharged to:: Unsure   Available Help at Discharge: Other (Comment)(caretaker )             Additional Comments: pt is a poor historian, limited recent information per chart, pt has a caregiver    Prior Function Level of Independence: Needs assistance   Gait / Transfers Assistance Needed: per chart pt was able to ambulate to the bathroom  ADL's / Homemaking Assistance Needed: per chart, pt required assistance with all ADL        Hand Dominance   Dominant Hand: Right  Extremity/Trunk Assessment   Upper Extremity Assessment Upper Extremity Assessment: Generalized weakness    Lower Extremity Assessment Lower Extremity Assessment: Generalized weakness    Cervical / Trunk Assessment Cervical / Trunk Assessment: Kyphotic   Communication   Communication: No difficulties  Cognition Arousal/Alertness: Awake/alert Behavior During Therapy: Flat affect;Agitated;WFL for tasks assessed/performed Overall Cognitive Status: No family/caregiver present to determine baseline cognitive functioning                                 General Comments: pt would get agitated at times, she is significantly confused talking about American culture vs [place] culture;nonsensical communication;pt did not follow directions/commands during session required max support throughout;dementia at baseline, per chart pt with significant sundowning      General Comments General comments (skin integrity, edema, etc.): vss    Exercises     Assessment/Plan    PT Assessment Patient needs continued PT services  PT Problem List Decreased strength;Decreased activity tolerance;Decreased balance;Decreased mobility;Decreased cognition;Decreased safety awareness       PT Treatment Interventions Gait training;Functional mobility training;Therapeutic activities;Therapeutic exercise;Balance training;Patient/family education    PT Goals (Current goals can be found in the Care Plan section)  Acute Rehab PT Goals Patient Stated Goal: pt did not state PT Goal Formulation: Patient unable to participate in goal setting Time For Goal Achievement: 08/01/19 Potential to Achieve Goals: Fair    Frequency Min 2X/week   Barriers to discharge        Co-evaluation PT/OT/SLP Co-Evaluation/Treatment: Yes Reason for Co-Treatment: Complexity of the patient's impairments (multi-system involvement);Necessary to address cognition/behavior during functional activity;For patient/therapist safety;To address functional/ADL transfers PT goals addressed during session: Mobility/safety with mobility OT goals addressed during session: ADL's and self-care       AM-PAC PT "6 Clicks" Mobility  Outcome Measure Help needed turning from your back to your  side while in a flat bed without using bedrails?: Total Help needed moving from lying on your back to sitting on the side of a flat bed without using bedrails?: Total Help needed moving to and from a bed to a chair (including a wheelchair)?: Total Help needed standing up from a chair using your arms (e.g., wheelchair or bedside chair)?: Total Help needed to walk in hospital room?: Total Help needed climbing 3-5 steps with a railing? : Total 6 Click Score: 6    End of Session Equipment Utilized During Treatment: Gait belt Activity Tolerance: Treatment limited secondary to agitation Patient left: in bed;with call bell/phone within reach;with bed alarm set;with restraints reapplied Nurse Communication: Mobility status PT Visit Diagnosis: Other abnormalities of gait and mobility (R26.89);Muscle weakness (generalized) (M62.81)    Time: [id number] PT Time Calculation (min) (ACUTE ONLY): 28 min   Charges:   PT Evaluation $PT Eval Moderate Complexity: 1 Mod          Ellamae Sia, [place], DPT Acute Rehabilitation Services Pager [phone] Office [phone]   Willy Eddy 07/18/2019, 12:02 PM

## 2019-07-18 NOTE — CV Procedure (Signed)
Patient is refusing the echocardiogram at this time.   Darlina Sicilian RDCS

## 2019-07-18 NOTE — Progress Notes (Addendum)
Occupational Therapy Evaluation Patient Details Name: [name] MRN: [id number] DOB: 01-20-1930 Today's Date: 07/18/2019    History of Present Illness 85 Y/O F with PMX of HLD, HTN, Dementia, Hx of Breast CA, Primary hyperthyroidism, TIA, CVA, Depression, brought in for unresponsiveness for a few minutes. According to her caregiver by her bedside, she did not lose consciousness.   Clinical Impression   Pt received in bed, pleasantly confused initially. Pt became agitated at times when working with OT/PT. Per chart, pt has been showing significant sundowning, neurocognitive decline recently. Pt currently requires maxA+2 to totalA+2 for ADL and stand-pivot transfer from EBO <> BSC. Pt did not follow commands during session and was often resistive to instructions from therapist at times pushing therapist away. Pt may benefit from longterm memory care facility. Pt requires maxA-totalA for all ADL, per chart this may be baseline. Due to decline in current level of function, pt would benefit from acute OT to address established goals to facilitate safe D/C to venue listed below. At this time, recommend SNF vs long term memory care facility follow-up. Will continue to follow acutely.     Follow Up Recommendations  Other (comment)(long term memory care facility ) SNF; 24/7 supervision/physical assistance   Equipment Recommendations  3 in 1 bedside commode    Recommendations for Other Services       Precautions / Restrictions Precautions Precautions: Fall Restrictions Weight Bearing Restrictions: No      Mobility Bed Mobility Overal bed mobility: Needs Assistance Bed Mobility: Sit to Supine;Supine to Sit     Supine to sit: Max assist;+2 for physical assistance;+2 for safety/equipment;Total assist Sit to supine: Max assist;+2 for physical assistance;+2 for safety/equipment;Total assist   General bed mobility comments: pt resistive to instruction from therapists, required  maxA-totalA for bed mobility   Transfers Overall transfer level: Needs assistance Equipment used: None Transfers: Sit to/from Stand;Stand Pivot Transfers Sit to Stand: Max assist;+2 physical assistance;+2 safety/equipment Stand pivot transfers: +2 physical assistance;+2 safety/equipment;Max assist;Total assist       General transfer comment: maxA+2 for powerup and support in standing, maxA+2 for stand-pivot from EOB to [facility], required maxA+2 from [facility] progressing to totalA+2 to pivot and return to EOB, pt resistive and not following commands during session    Balance Overall balance assessment: Needs assistance Sitting-balance support: Bilateral upper extremity supported;Feet supported Sitting balance-Leahy Scale: Poor Sitting balance - Comments: requires therapist support to maintain upright balance Postural control: Posterior lean Standing balance support: Bilateral upper extremity supported;During functional activity Standing balance-Leahy Scale: Zero Standing balance comment: maxA+2 for support in standing                           ADL either performed or assessed with clinical judgement   ADL Overall ADL's : Needs assistance/impaired;At baseline                                       General ADL Comments: pt requires max assistance for all ADL, transferred to [facility] with maxA+2, required totalA for posterior pericare and totalA for LB dressing;pt significantly limited by cognition     Vision         Perception     Praxis      Pertinent Vitals/Pain Pain Assessment: Faces Faces Pain Scale: No hurt Pain Intervention(s): Monitored during session     Hand Dominance Right  Extremity/Trunk Assessment Upper Extremity Assessment Upper Extremity Assessment: Generalized weakness   Lower Extremity Assessment Lower Extremity Assessment: Defer to PT evaluation   Cervical / Trunk Assessment Cervical / Trunk Assessment: Kyphotic   Communication  Communication Communication: No difficulties   Cognition Arousal/Alertness: Awake/alert Behavior During Therapy: Flat affect;Agitated;WFL for tasks assessed/performed Overall Cognitive Status: No family/caregiver present to determine baseline cognitive functioning                                 General Comments: pt would get agitated at times, she is significantly confused talking about American culture vs British culture;nonsensical communication;pt did not follow directions/commands during session required max support throughout;dementia at baseline, per chart pt with significant sundowning   General Comments  vss    Exercises     Shoulder Instructions      Home Living Family/patient expects to be discharged to:: Unsure   Available Help at Discharge: Other (Comment)(caretaker )                             Additional Comments: pt is a poor historian, limited recent information per chart, pt has a caregiver      Prior Functioning/Environment Level of Independence: Needs assistance  Gait / Transfers Assistance Needed: per chart pt was able to ambulate to the bathroom ADL's / Homemaking Assistance Needed: per chart, pt required assistance with all ADL            OT Problem List: Decreased activity tolerance;Impaired balance (sitting and/or standing);Decreased cognition;Decreased safety awareness;Decreased knowledge of use of DME or AE      OT Treatment/Interventions: Self-care/ADL training;Therapeutic exercise;Energy conservation;Therapeutic activities;Cognitive remediation/compensation;Patient/family education;Balance training    OT Goals(Current goals can be found in the care plan section) Acute Rehab OT Goals Patient Stated Goal: pt did not state OT Goal Formulation: With patient Time For Goal Achievement: 08/01/19 Potential to Achieve Goals: Fair ADL Goals Pt Will Perform Grooming: with set-up;sitting Pt Will Perform Upper Body Dressing:  with min assist;sitting Pt Will Perform Lower Body Dressing: with min assist;sit to/from stand Additional ADL Goal #1: Pt will follow one step commands consistently during ADL completion.  OT Frequency: Min 1X/week   Barriers to D/C: Decreased caregiver support          Co-evaluation PT/OT/SLP Co-Evaluation/Treatment: Yes Reason for Co-Treatment: Complexity of the patient's impairments (multi-system involvement);For patient/therapist safety;To address functional/ADL transfers   OT goals addressed during session: ADL's and self-care      AM-PAC OT "6 Clicks" Daily Activity     Outcome Measure Help from another person eating meals?: A Lot Help from another person taking care of personal grooming?: A Lot Help from another person toileting, which includes using toliet, bedpan, or urinal?: A Lot Help from another person bathing (including washing, rinsing, drying)?: A Lot Help from another person to put on and taking off regular upper body clothing?: A Lot Help from another person to put on and taking off regular lower body clothing?: Total 6 Click Score: 11   End of Session Equipment Utilized During Treatment: Gait belt;Oxygen Nurse Communication: Mobility status(pt had BM)  Activity Tolerance: Patient tolerated treatment well Patient left: in bed;with call bell/phone within reach;with bed alarm set;with restraints reapplied  OT Visit Diagnosis: Unsteadiness on feet (R26.81);Other abnormalities of gait and mobility (R26.89);Muscle weakness (generalized) (M62.81);History of falling (Z91.81);Other symptoms and signs involving cognitive function  Time: [id number] OT Time Calculation (min): 28 min Charges:  OT General Charges $OT Visit: 1 Visit OT Evaluation $OT Eval Moderate Complexity: [place] OTR/L Acute Rehabilitation Services Office: [place] 07/18/2019, 10:40 AM

## 2019-07-18 NOTE — Progress Notes (Signed)
EEG Completed; Results Pending  

## 2019-07-18 NOTE — Progress Notes (Addendum)
NEUROLOGY PROGRESS NOTE  Subjective: Patient is very confused and agitated at this time.  She is refusing any form of the exam including answering questions as she feels frustrated with both being here and also everybody asking her questions .  Exam: Vitals:   07/17/19 2033 07/18/19 0721  BP: (!) 196/86 (!) 180/98  Pulse: (!) 114 (!) 115  Resp: 18   Temp:  97.8 F (36.6 C)  SpO2: 93%     ROS Unable to obtain as patient is frustrated and not willing to answer questions   Physical Exam  Constitutional: Appears well-developed and well-nourished.  Psych: Agitated Eyes: No scleral injection HENT: No OP obstrucion Head: Normocephalic.  Cardiovascular: Normal rate and regular rhythm.  Respiratory: Effort normal, non-labored breathing GI: Soft.  No distension. There is no tenderness.  Skin: WDI   Neuro:  Mental Status: Patient is alert, will not answer any form of questions about location, date, birth date or even her name.  Patient's voice is very dysarthric I would attribute this to her dry oral cavity Cranial Nerves: II: Blinks to threat bilaterally III,IV, VI: Patient tracks practitioner in room, pupils equal, round, reactive to light and accommodation V,VII: Face symmetrical, facial light touch-will not let practitioner get near her to test sensation VIII: hearing normal bilaterally  Motor: Doing all extremities with 5 out of 5 strength.  Although she does have upper extremity restraints she pulls very hard giving me a 5/5 strength Tone and bulk:normal tone throughout; no atrophy noted Sensory: Cannot test as patient becomes very agitated and will not let me touch patient Deep Tendon Reflexes: Unable to obtain Plantars: Unable to obtain     Medications:  Scheduled: . amLODipine  5 mg Oral Daily  . diazepam  5 mg Oral Q2000  . enoxaparin (LOVENOX) injection  40 mg Subcutaneous Q24H  . sodium chloride flush  3 mL Intravenous Q12H    Pertinent  Labs/Diagnostics:   Ct Head Wo Contrast  Result Date: 07/17/2019  IMPRESSION: No acute intracranial hemorrhage, mass effect, or evidence of acute infarction. Stable chronic findings detailed above. Electronically Signed   By: Macy Mis M.D.   On: 07/17/2019 14:56   Mr Brain Wo Contrast  Result Date: 07/17/2019  IMPRESSION: 1. No acute intracranial abnormality. 2. Advanced chronic ischemic microangiopathy and generalized atrophy without lobar predilection. Electronically Signed   By: Ulyses Jarred M.D.   On: 07/17/2019 19:59   EEG was attempted at bedside.  As noted in this note, patient was agitated yesterday and unable to obtain.  They will attempt again today however I have high suspicion this will not be able to be obtained due to her agitation this morning   Assessment: 85 year old female with an episode that initially sounded like syncope however as stated prior did last for approximately 12 minutes which is very abnormal for syncopal event.  Patient continues to have memory disorder concerning for dementia, seizure is definitely a possibility.  As noted MRI was negative.  Will attempt to obtain EEG if possible.  Polypharmacy has been adjusted.  Recommendations: -EEG if able to be obtained -As noted prior blood pressure control which is being taken care of with her outpatient cardiologist -Seizure precautions -Would also watch for possible opioid withdrawal -We will follow up to see if EEG was able to be obtained.   Etta Quill PA-C [facility] [phone]  07/18/2019, 8:52 AM  I have seen the patient reviewed the above note.  She actually seems more confused  today, I suspect multifactorial delirium.  She had memory problems, but markedly worsened following a fall with vertebral fractures 2 weeks ago.  I suspect that a combination of polypharmacy and pain precipitated delirium, but I tried to contact the caregiver to see if any of the abrupt decline was  antecedent to her fall.  Clearly following this, she increased her pain medication usage and this could be responsible for her delirium.  Currently, I think it is more of a hospital related delirium.  The etiology of the episode that happened while straining is markedly less clear and she will be getting an EEG today.  I do not think that I would start antiepileptics unless there is epileptiform abnormality on the EEG.  Roland Rack, MD [facility] [phone]  If 7pm- 7am, please page neurology on call as listed in [place].

## 2019-07-18 NOTE — TOC Initial Note (Signed)
Transition of Care [facility]) - Initial/Assessment Note    Patient Details  Name: [name] MRN: [id number] Date of Birth: [birth year]  [facility] CM/SW Contact:    Benard Halsted, LCSW Phone Number: 07/18/2019, 5:10 PM  Clinical Narrative:                 CSW received consult for possible SNF placement at time of discharge. CSW spoke with patient's niece regarding PT recommendation of SNF placement at time of discharge. Patient's niece stated that patient lives in a house in [place] with a caregiver, Otila Kluver (and Tina's husband). Tina coordinates 24 hour care for patient. Patient's niece is requesting to confer with Otila Kluver to see if she can continue to care for patient at home. Her current concern is whether patient's agitation will persist at home and be unmanageable for [facility]. CSW discussed need for patient to be able to participate in therapy for Medicare to pay for SNF. CSW explained private pay cost of long term care as well. Patient's niece will contact CSW back with their decision. No further questions reported at this time. CSW to continue to follow and assist with discharge planning needs.     Barriers to Discharge: Continued Medical Work up   Patient Goals and CMS Choice Patient states their goals for this hospitalization and ongoing recovery are:: Return to baseline CMS Medicare.gov Compare Post Acute Care list provided to:: Patient Represenative (must comment)(Niece) Choice offered to / list presented to : (Niece)  Expected Discharge Plan and Services   In-house Referral: Clinical Social Work     Living arrangements for the past 2 months: Single Family Home                                      Prior Living Arrangements/Services Living arrangements for the past 2 months: Single Family Home Lives with:: Other (Comment)(Caregiver, Acupuncturist) Patient language and need for interpreter reviewed:: Yes Do you feel safe going back to the place where you  live?: Yes      Need for Family Participation in Patient Care: Yes (Comment) Care giver support system in place?: Yes (comment) Current home services: DME Criminal Activity/Legal Involvement Pertinent to Current Situation/Hospitalization: No - Comment as needed  Activities of Daily Living      Permission Sought/Granted Permission sought to share information with : Facility Sport and exercise psychologist, Family Supports Permission granted to share information with : No  Share Information with NAME: Helene Kelp     Permission granted to share info w Relationship: Niece  Permission granted to share info w Contact Information: [phone]  Emotional Assessment Appearance:: Appears stated age Attitude/Demeanor/Rapport: Unable to Assess Affect (typically observed): Unable to Assess Orientation: : (Disoriented x4) Alcohol / Substance Use: Not Applicable Psych Involvement: No (comment)  Admission diagnosis:  Syncope, unspecified syncope type [R55] Patient Active Problem List   Diagnosis Date Noted  . Syncope 07/17/2019  . Cognitive deficit due to recent stroke 03/08/2017  . Alteration of sensation as late effect of stroke 03/08/2017  . Gait disturbance, post-stroke 03/08/2017  . Acute arterial ischemic stroke, vertebrobasilar, thalamic, left ([place]) 01/10/2017  . Neurologic gait disorder   . Depression   . Benign essential HTN   . History of breast cancer   . Stroke (cerebrum) ([place]) 01/08/2017  . TIA (transient ischemic attack) 12/16/2016  . Dysarthria 12/16/2016  . Leg weakness 12/16/2016  . EKG abnormalities 12/16/2016  .  Chronic pain 12/16/2016  . Elevated blood pressure 12/22/2014  . Internal hemorrhoids with bleeding and prolapse 11/04/2014  . Lactose intolerance 11/04/2014  . Hemorrhoid 09/11/2014  . Rectal bleeding 09/11/2014  . IBS (irritable bowel syndrome) 09/25/2013  . Hyperparathyroidism, primary ([place]) 05/26/2011  . Osteoporosis 05/09/2009  . POLYCYTHEMIA 04/23/2009  .  IMPAIRED FASTING GLUCOSE 11/14/2008  . BREAST CANCER, HX OF 12/06/2007  . Anxiety state 10/13/2007  . Essential hypertension 10/13/2007  . INTERSTITIAL CYSTITIS 09/06/2007  . HLD (hyperlipidemia) 03/01/2007  . Backache 03/01/2007   PCP:  Seward Carol, MD Pharmacy:   [place], [place] [address] [place] [address] Phone: [phone] Fax: [place], [place] [address] Phone: [phone] Fax: [phone]  [id number] - [place], [place] - [address] [place] [address] [address] Phone: [phone] Fax: [phone]  [id number] - [place], [address] [place] [address] Phone: [phone] Fax: [phone]     Social Determinants of Health (SDOH) Interventions    Readmission Risk Interventions No flowsheet data found.

## 2019-07-19 ENCOUNTER — Inpatient Hospital Stay (HOSPITAL_COMMUNITY): Payer: Medicare Other

## 2019-07-19 DIAGNOSIS — Z79899 Other long term (current) drug therapy: Secondary | ICD-10-CM

## 2019-07-19 DIAGNOSIS — F0391 Unspecified dementia with behavioral disturbance: Secondary | ICD-10-CM

## 2019-07-19 DIAGNOSIS — R55 Syncope and collapse: Secondary | ICD-10-CM

## 2019-07-19 LAB — ECHOCARDIOGRAM COMPLETE
Height: 67 in
Weight: 2455.04 oz

## 2019-07-19 LAB — CBC
HCT: 44.3 % (ref 36.0–46.0)
Hemoglobin: 14.9 g/dL (ref 12.0–15.0)
MCH: 30.8 pg (ref 26.0–34.0)
MCHC: 33.6 g/dL (ref 30.0–36.0)
MCV: 91.7 fL (ref 80.0–100.0)
Platelets: 202 10*3/uL (ref 150–400)
RBC: 4.83 MIL/uL (ref 3.87–5.11)
RDW: 11.6 % (ref 11.5–15.5)
WBC: 9.6 10*3/uL (ref 4.0–10.5)
nRBC: 0 % (ref 0.0–0.2)

## 2019-07-19 LAB — GLUCOSE, CAPILLARY: Glucose-Capillary: 130 mg/dL — ABNORMAL HIGH (ref 70–99)

## 2019-07-19 LAB — BASIC METABOLIC PANEL
Anion gap: 10 (ref 5–15)
BUN: 15 mg/dL (ref 8–23)
CO2: 25 mmol/L (ref 22–32)
Calcium: 8.8 mg/dL — ABNORMAL LOW (ref 8.9–10.3)
Chloride: 105 mmol/L (ref 98–111)
Creatinine, Ser: 0.83 mg/dL (ref 0.44–1.00)
GFR calc Af Amer: >60 mL/min (ref >60)
GFR calc non Af Amer: >60 mL/min (ref >60)
Glucose, Bld: 135 mg/dL — ABNORMAL HIGH (ref 70–99)
Potassium: 3.5 mmol/L (ref 3.5–5.1)
Sodium: 140 mmol/L (ref 135–145)

## 2019-07-19 MED ORDER — LORAZEPAM 2 MG/ML IJ SOLN
2.0000 mg | Freq: Once | INTRAMUSCULAR | Status: AC
  Administered 2019-07-19: 2 mg via INTRAMUSCULAR

## 2019-07-19 MED ORDER — DIAZEPAM 2 MG PO TABS: 2.0000 mg | ORAL_TABLET | Freq: Every day | ORAL | Status: DC

## 2019-07-19 MED ORDER — LORAZEPAM 2 MG/ML IJ SOLN
INTRAMUSCULAR | Status: AC
  Filled 2019-07-19: qty 1

## 2019-07-19 MED ORDER — RAMELTEON 8 MG PO TABS
8.0000 mg | ORAL_TABLET | Freq: Every day | ORAL | Status: DC
  Filled 2019-07-19 (×2): qty 1

## 2019-07-19 NOTE — Discharge Summary (Addendum)
[facility] Discharge Summary  Patient name: [name] Medical record number: [id number] Date of birth: 1929-10-10 Age: 85 y.o. Gender: female Date of Admission: 07/17/2019  Date of Discharge: 07/23/19  Admitting Physician: Kinnie Feil, MD  Primary Care Provider: Seward Carol, MD Consultants: Neurology  Indication for Hospitalization: Altered mental status  Discharge Diagnoses/Problem List:  Altered mental status Presyncope Polypharmacy Hypertension PTSD Anxiety Hyperlipidemia  Disposition:  Home with home health   Discharge Condition: Improved  Discharge Exam:   GEN: Elderly female sitting upright in chair in no acute distress CV: regular rate and rhythm, no murmurs appreciated RESP: no increased work of breathing, clear to ascultation bilaterally  ABD: Soft, Nontender, Non-distended MSK: no lower extremity edema, or cyanosis noted NEURO: Alert, normal speech, moves all extremities appropriately PSYCH: Normal affect and thought content  Brief Hospital Course:   Syncope  Altered mental status  Polypharmacy [name] is a 85 y.o. female presented from home after caregiver found her to be unresponsive for 12 minutes in the bathroom. CT head and MRI were grossly unremarkable.  Neurology suggested is unlikely syncopal event as patient was unresponsive for 12 minutes and this presentation most likely with seizure versus dementia.  EEG was however unremarkable.  Etiology of patient's altered mental status likely due to polypharmacy. Patient was prescribed multiple Beers list outpatient medications: soma for chronic back pain, percocet  for chronic pain, valium for anxiety, neurontin, and celebrex.  All sedating medications besides Percocet and Valium were held.  Valium was titrated to 2.5 mg AM and 5 mg PM and Percocet was titrated to 5 mg TID as needed prior to discharge.  SLP was consulted however patient was not able to  participate in [facility].  PT and OT recommended 24-hour care and patient's caregiver/medical decision-maker, Carolynn Serve, agreed the patient should undergo rehab at home.  Carolynn Serve states that she has a follow-up appointment this week with her PCP.   Hypertension Her caretaker, Carolynn Serve, reported that she is prescribed metoprolol however she refuses to take this medication.  Blood pressures were elevated during this admission and were controlled with 5 mg amlodipine daily.    Issues for Follow Up:  1. Medication changes: Discontinued Soma and gabapentin.  Discharged with Valium 2.5 mg a.m. and 5 mg p.m. taking with food.  Recommend weaning Valium 25% every other week and similarly weaning narcotics every other week. Valium and Percocet  are on [employer] List and can contribute to patient having frequent falls and/or syncopal episodes. 2. Patient had elevated blood pressures during admission. Continue to monitor patient's blood pressure and consider antihypertensive in the future.  3. Patient to have PT and OT at home.  Additionally, ensure the patient received light weight wheelchair and 3 in 1 commode.   Significant Procedures: None  Significant Labs and Imaging:  Recent Labs  Lab 07/19/19 0346 07/20/19 0301 07/21/19 0350  WBC 9.6 11.3* 8.6  HGB 14.9 11.7* 14.6  HCT 44.3 35.2* 43.2  PLT 202 310 214   Recent Labs  Lab 07/17/19 1232 07/18/19 0241 07/19/19 0346 07/20/19 0301 07/21/19 0350 07/22/19 0717  NA 139 141 140 140 141  --   K 4.1 3.7 3.5 4.2 3.2* 4.1  CL 100 103 105 101 105  --   CO2 29 28 25 29 24   --   GLUCOSE 147* 149* 135* 92 122*  --   BUN 18 12 15 18 13   --   CREATININE 0.94 0.89 0.83 0.86  0.76  --   CALCIUM 9.1 9.3 8.8* 8.3* 8.9  --   ALKPHOS 107 114  --   --   --   --   AST 25 24  --   --   --   --   ALT 24 27  --   --   --   --   ALBUMIN 3.5 3.7  --   --   --   --     Ct Head Wo Contrast  Result Date: 07/17/2019 CLINICAL DATA:  Syncopal episode EXAM: CT HEAD WITHOUT CONTRAST  TECHNIQUE: Contiguous axial images were obtained from the base of the skull through the vertex without intravenous contrast. COMPARISON:  2018 FINDINGS: Brain: There is no acute intracranial hemorrhage, mass-effect, or edema. Gray-white differentiation is preserved. There is no extra-axial fluid collection. Ventricles and sulci are stable in size and configuration. Patchy and confluent areas of hypoattenuation in the supratentorial white matter are nonspecific but likely reflect stable chronic microvascular ischemic changes. There are chronic small vessel infarcts of the left thalamus and posterior to the left lentiform nucleus. Vascular: There is atherosclerotic calcification at the skull base. Skull: Calvarium is unremarkable. Sinuses/Orbits: No acute finding. Other: None. IMPRESSION: No acute intracranial hemorrhage, mass effect, or evidence of acute infarction. Stable chronic findings detailed above. Electronically Signed   By: Macy Mis M.D.   On: 07/17/2019 14:56   Mr Brain Wo Contrast  Result Date: 07/17/2019 CLINICAL DATA:  Encephalopathy with syncopal episode EXAM: MRI HEAD WITHOUT CONTRAST TECHNIQUE: Multiplanar, multiecho pulse sequences of the brain and surrounding structures were obtained without intravenous contrast. COMPARISON:  Head CT 07/17/2019, brain MRI 01/08/2017 FINDINGS: Brain: There is no acute infarct, acute hemorrhage or extra-axial collection. Diffuse confluent hyperintense T2-weighted signal within the periventricular, deep and juxtacortical white matter, most commonly due to chronic ischemic microangiopathy. There is generalized atrophy without lobar predilection. Blood-sensitive sequences show no chronic microhemorrhage or superficial siderosis. The midline structures are normal. Vascular: Normal flow voids Skull and upper cervical spine: The bone marrow signal of the cranium and upper cervical vertebrae is normal. There is no skull base lesion. The visualized upper cervical  spinal cord is normal. Sinuses/Orbits: There is no paranasal sinus fluid level or advanced mucosal thickening. There is no mastoid or middle ear effusion. The orbits are normal. Other: None IMPRESSION: 1. No acute intracranial abnormality. 2. Advanced chronic ischemic microangiopathy and generalized atrophy without lobar predilection. Electronically Signed   By: Ulyses Jarred M.D.   On: 07/17/2019 19:59     Results/Tests Pending at Time of Discharge: None   Discharge Medications:  Allergies as of 07/23/2019      Reactions   Anectine [succinylcholine Chloride] Other (See Comments)   Reaction:  Patient states that her body wasn't functioning.  Pt states she was put on life support.   Dilaudid [hydromorphone Hcl] Other (See Comments)   Slept for 3 days and 3 nights   Hydrocodone-acetaminophen Other (See Comments)   Hallucinations, paranoia, amnesia Pt takes Tylenol at home   Iodine Hives, Other (See Comments)   , asthma   Naproxen Sodium Other (See Comments)   REACTION: Asthma   Oxycodone Hcl Other (See Comments)    Hallucinations, paranoia, amnesia Pt takes Percocet at home with no issues   Pentosan Polysulfate Sodium Other (See Comments)   REACTION: unspecified per patient   Pseudoephedrine Other (See Comments)    Palpitations   Succinylcholine    REACTION: "Can't wake up".   Tape Dermatitis  Red inflammed   Tramadol Hcl Other (See Comments)   Unknown reaction   Shellfish Allergy    Runny nose, cough, hoarseness      Medication List    STOP taking these medications   carisoprodol 350 MG tablet Commonly known as: SOMA   gabapentin 100 MG capsule Commonly known as: Neurontin   Soma 250 MG tablet Generic drug: carisoprodol     TAKE these medications   acetaminophen 500 MG tablet Commonly known as: TYLENOL Take 1,000 mg by mouth every 6 (six) hours as needed for mild pain.   CALCIUM-MAGNESIUM PO Take 1 tablet by mouth daily.   celecoxib 200 MG capsule Commonly  known as: CELEBREX Take 1 capsule (200 mg total) by mouth daily as needed (arthritis pain).   diazepam 5 MG tablet Commonly known as: VALIUM Take 1/2 tablet in AM (2.5mg ) and 1 tablet at night (5mg ). What changed:   medication strength  how much to take  how to take this  when to take this  additional instructions   estradiol 0.1 MG/GM vaginal cream Commonly known as: ESTRACE Place 1 Applicatorful vaginally daily as needed (for dryness).   fluticasone 50 MCG/ACT nasal spray Commonly known as: FLONASE Place 2 sprays into both nostrils daily. What changed:   when to take this  reasons to take this   GELUSIL PO Take 1 tablet by mouth daily as needed (acid reflux).   multivitamin with minerals Tabs tablet Take 1 tablet by mouth daily as needed (when not eating well).   oxyCODONE-acetaminophen 5-325 MG tablet Commonly known as: PERCOCET/ROXICET Take 1 tablet by mouth every 8 (eight) hours as needed for severe pain. What changed: how much to take   Red Yeast Rice Extract 600 MG Caps Take 1 capsule by mouth daily.   VITAMIN C PO Take 1 tablet by mouth daily as needed (when not eating enough fruit).   Vitamin D3 25 MCG (1000 UT) Caps Take 1 capsule (1,000 Units total) by mouth daily.   VITAMIN E PO Take 1 capsule by mouth daily.            Durable Medical Equipment  (From admission, onward)         Start     Ordered   07/23/19 1542  For home use only DME wheelchair cushion (seat and back)  Once     07/23/19 1541   07/23/19 1540  For home use only DME 3 n 1  Once     07/23/19 1539   07/23/19 1535  For home use only DME lightweight manual wheelchair with seat cushion  Once    Comments: Patient suffers from dementia which impairs their ability to perform daily activities like bathing, grooming and toileting in the home.  A walker will not resolve  issue with performing activities of daily living. A wheelchair will allow patient to safely perform daily  activities. Patient is not able to propel themselves in the home using a standard weight wheelchair due to general weakness. Patient can self propel in the lightweight wheelchair. Length of need 6 months . Accessories: elevating leg rests (ELRs), wheel locks, extensions and anti-tippers.   07/23/19 1539        Discharge Instructions: Please refer to Patient Instructions section of EMR for full details.  Patient was counseled important signs and symptoms that should prompt return to medical care, changes in medications, dietary instructions, activity restrictions, and follow up appointments.   Follow-Up Appointments:   Lyndee Hensen, MD 07/23/2019,  3:53 PM PGY-1, [facility] -------------------------------------------------------------------------------------------- Upper Level Addendum: I have seen and evaluated this patient along with Dr. Susa Simmonds and reviewed the above note, making necessary revisions if needed  Guadalupe Dawn MD PGY-3 Family Medicine Resident
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
## 2019-07-19 NOTE — Progress Notes (Addendum)
NEUROLOGY PROGRESS NOTE  Subjective: Patient seems to be slightly improved but continues to remain confused. She is not as agitated, able to follow commands, answer questions however speech is tangential and thoughts are fleeting. No evidence of dysarthria or aphasia.   Exam: Vitals:   07/19/19 0639 07/19/19 0805  BP: (!) 142/72 (!) 174/86  Pulse: 99 100  Resp: 18 18  Temp: 98.4 F (36.9 C) 97.6 F (36.4 C)  SpO2: 93%    ROS Unable to obtain as patient gets distracted and goes on tangents   Physical Exam  Constitutional: Appears well-developed and well-nourished.  Psych: Agitated Eyes: No scleral injection HENT: No OP obstrucion Head: Normocephalic.  Cardiovascular: Normal rate and regular rhythm.  Respiratory: Effort normal, non-labored breathing GI: Soft.  No distension. There is no tenderness.  Skin: WDI  Neuro:  Mental Status: Patient is alert, oriented to self, thinks she is at her living facility, says the year is 31 but knows trump is president. Cranial Nerves: II: Blinks to threat bilaterally III,IV, VI: Patient tracks practitioner in room, pupils equal, round, reactive to light and accommodation V,VII: Face symmetrical, facial light touch VIII: hearing normal bilaterally  Motor: Moving all extremities with 5 out of 5 strength.  Tone and bulk:normal tone throughout; no atrophy noted Sensory: grossly normal Deep Tendon Reflexes: Unable to obtain Plantars: Unable to obtain  Medications:  Scheduled: . amLODipine  5 mg Oral Daily  . diazepam  5 mg Oral BID  . enoxaparin (LOVENOX) injection  40 mg Subcutaneous Q24H  . oxyCODONE-acetaminophen  1 tablet Oral Q8H  . sodium chloride flush  3 mL Intravenous Q12H    Pertinent Labs/Diagnostics: Ct Head Wo Contrast No acute intracranial hemorrhage, mass effect, or evidence of acute infarction. Stable chronic findings detailed above. Electronically Signed   By: Macy Mis M.D.   On: 07/17/2019 14:56   Mr  Brain Wo Contrast 1. No acute intracranial abnormality. 2. Advanced chronic ischemic microangiopathy and generalized atrophy without lobar predilection. Electronically Signed     EEG neg for seizure activity, generalized slowing suggesting diffuse encephalopathy.  Assessment: 85 year old female with reports of staring episode that lasted for approximately 12 minutes, which is very abnormal for syncopal event.  Patient continues to have memory disorder concerning for dementia, seizure neg on EEG.  As noted MRI was negative. Polypharmacy could also be a factor as pt was noted to be on percocent, valium, and soma at home.  Recommendations: - Continue watching for possible opioid/benzo withdrawal - Could trial depakote 500mg  qd for few days for refractory delirium and re-assess. Recommend monitoring levels for therapeutic range.   Posey Pronto PA-C [facility] [phone]  I have seen the patient and reviewed the above note.  She seems to be significantly improved compared to yesterday, I wonder if polypharmacy is playing a significant role.  With her improvement I do not feel strongly about adding a psychoactive medication (Depakote).  At this point, I think that minimizing sedating medications is the primary intervention.  As above, if it continues to be refractory, could try Depakote.  I do not have any further neurodiagnostic testing to recommend.  Please call with further questions or concerns.  Roland Rack, MD [facility] [phone]  If 7pm- 7am, please page neurology on call as listed in [place].

## 2019-07-19 NOTE — TOC Progression Note (Signed)
Transition of Care [facility]) - Progression Note    Patient Details  Name: [name] MRN: [id number] Date of Birth: [birth year]  [facility] CM/SW Contact  Pollie Friar, RN Phone Number: 07/19/2019, 12:36 PM  Clinical Narrative:    CM spoke to Caregiver: Tena on the phone. She states she is the POA and has brought in the paperwork. She asked that patient be faxed out in the [place] area. Pt will need PASAR completed which will go to manual review.  TOC following.     Barriers to Discharge: Continued Medical Work up  Expected Discharge Plan and Services   In-house Referral: Clinical Social Work     Living arrangements for the past 2 months: Single Family Home                                       Social Determinants of Health (SDOH) Interventions    Readmission Risk Interventions No flowsheet data found.

## 2019-07-19 NOTE — Progress Notes (Signed)
FPTS Interim Progress Note  Pt has dementia and is known to sundown around 9 PM. Tonight RN called this provider because pt is sundowning: disoriented, removed IV, attempting to climb out of bed. Delirium precautions and sitter expired. Pt no longer has IV and is refusing medications by mouth.   - Delirium precautions re-ordered - 1:1 sitter re-ordered - Ativan 2 mg IM ordered  Gladys Damme, MD 07/19/2019, 9:15 PM PGY-1, [place] [facility] Service pager [phone]

## 2019-07-20 DIAGNOSIS — R410 Disorientation, unspecified: Secondary | ICD-10-CM

## 2019-07-20 LAB — CBC
HCT: 35.2 % — ABNORMAL LOW (ref 36.0–46.0)
Hemoglobin: 11.7 g/dL — ABNORMAL LOW (ref 12.0–15.0)
MCH: 31.9 pg (ref 26.0–34.0)
MCHC: 33.2 g/dL (ref 30.0–36.0)
MCV: 95.9 fL (ref 80.0–100.0)
Platelets: 310 10*3/uL (ref 150–400)
RBC: 3.67 MIL/uL — ABNORMAL LOW (ref 3.87–5.11)
RDW: 12.5 % (ref 11.5–15.5)
WBC: 11.3 10*3/uL — ABNORMAL HIGH (ref 4.0–10.5)
nRBC: 0 % (ref 0.0–0.2)

## 2019-07-20 LAB — BASIC METABOLIC PANEL
Anion gap: 10 (ref 5–15)
BUN: 18 mg/dL (ref 8–23)
CO2: 29 mmol/L (ref 22–32)
Calcium: 8.3 mg/dL — ABNORMAL LOW (ref 8.9–10.3)
Chloride: 101 mmol/L (ref 98–111)
Creatinine, Ser: 0.86 mg/dL (ref 0.44–1.00)
GFR calc Af Amer: >60 mL/min (ref >60)
GFR calc non Af Amer: >60 mL/min (ref >60)
Glucose, Bld: 92 mg/dL (ref 70–99)
Potassium: 4.2 mmol/L (ref 3.5–5.1)
Sodium: 140 mmol/L (ref 135–145)

## 2019-07-20 MED ORDER — AMLODIPINE BESYLATE 10 MG PO TABS
10.0000 mg | ORAL_TABLET | Freq: Every day | ORAL | Status: DC
  Administered 2019-07-20 – 2019-07-22 (×3): 10 mg via ORAL
  Filled 2019-07-20 (×2): qty 1
  Filled 2019-07-20: qty 2

## 2019-07-20 MED ORDER — DIAZEPAM 5 MG PO TABS
5.0000 mg | ORAL_TABLET | Freq: Every day | ORAL | Status: DC
  Administered 2019-07-20 – 2019-07-22 (×3): 5 mg via ORAL
  Filled 2019-07-20 (×3): qty 1

## 2019-07-20 MED ORDER — RAMELTEON 8 MG PO TABS
8.0000 mg | ORAL_TABLET | Freq: Every day | ORAL | Status: DC
  Administered 2019-07-21: 8 mg via ORAL
  Filled 2019-07-20 (×5): qty 1

## 2019-07-20 MED ORDER — DIAZEPAM 2 MG PO TABS
2.0000 mg | ORAL_TABLET | Freq: Every morning | ORAL | Status: DC
  Administered 2019-07-21 – 2019-07-23 (×3): 2 mg via ORAL
  Filled 2019-07-20 (×3): qty 1

## 2019-07-20 MED ORDER — AMLODIPINE BESYLATE 10 MG PO TABS: 10.0000 mg | ORAL_TABLET | Freq: Every day | ORAL | Status: DC

## 2019-07-20 MED ORDER — DIAZEPAM 2 MG PO TABS
2.0000 mg | ORAL_TABLET | Freq: Once | ORAL | Status: AC
  Administered 2019-07-20: 2 mg via ORAL
  Filled 2019-07-20: qty 1

## 2019-07-20 MED ORDER — AMLODIPINE BESYLATE 5 MG PO TABS: 5.0000 mg | ORAL_TABLET | Freq: Every day | ORAL | Status: DC
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
## 2019-07-20 NOTE — Progress Notes (Signed)
Patient has been refusing all schedule treatments in this shift, oncall provider is awared, will continue to monitor closely.

## 2019-07-20 NOTE — Progress Notes (Signed)
Patient very agitated and confused. Refused to take morning medications (MD made aware) and refused to eat lunch. Nurse will continue to reorient and monitor. Gwendolyn Grant, RN

## 2019-07-20 NOTE — TOC Progression Note (Addendum)
Transition of Care [facility]) - Progression Note    Patient Details  Name: [name] MRN: [id number] Date of Birth: 1930-06-22  Transition of Care [facility]) CM/SW [place], LCSW Phone Number: 07/20/2019, 11:19 AM  Clinical Narrative:    Patient's Pasrr is currently pending and their office is closed for the holiday. Patient unable to discharge to SNF without Pasrr.   CSW spoke with patient's POA, Tena, to provide an update. CSW provided SNF bed offers, however, patient will need to have behaviors controlled prior to discharge. CSW also spoke with Carolynn Serve about potential private pay long term care.      Barriers to Discharge: Continued Medical Work up  Expected Discharge Plan and Services   In-house Referral: Clinical Social Work     Living arrangements for the past 2 months: Single Family Home                                       Social Determinants of Health (SDOH) Interventions    Readmission Risk Interventions No flowsheet data found.
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
## 2019-07-20 NOTE — Progress Notes (Addendum)
[name], caregiver and decision maker, to discuss SNF placement versus home.  Additionally, patient refusing medication and is kicking at staff in hopes that patient can be redirected by caretaker.  Voicemail left to return phone call.

## 2019-07-20 NOTE — NC FL2 (Signed)
[place] LEVEL OF CARE SCREENING TOOL     IDENTIFICATION  Patient Name: [name] Birthdate: [birth year] Sex: female Admission Date (Current Location): 07/17/2019  [facility] and [place] Number:  Herbalist and Address:  [facility], [place] [address] [place], [place] [address]      Provider Number: [id number]  Attending Physician Name and Address:  Martyn Malay, MD  Relative Name and Phone Number:  Helene Kelp niece, [phone]    Current Level of Care: Hospital Recommended Level of Care: [place] Prior Approval Number:    Date Approved/Denied:   PASRR Number: Pending, under review  Discharge Plan: SNF    Current Diagnoses: Patient Active Problem List   Diagnosis Date Noted  . Syncope 07/17/2019  . Cognitive deficit due to recent stroke 03/08/2017  . Alteration of sensation as late effect of stroke 03/08/2017  . Gait disturbance, post-stroke 03/08/2017  . Acute arterial ischemic stroke, vertebrobasilar, thalamic, left ([place]) 01/10/2017  . Neurologic gait disorder   . Depression   . Benign essential HTN   . History of breast cancer   . Stroke (cerebrum) ([place]) 01/08/2017  . TIA (transient ischemic attack) 12/16/2016  . Dysarthria 12/16/2016  . Leg weakness 12/16/2016  . EKG abnormalities 12/16/2016  . Chronic pain 12/16/2016  . Elevated blood pressure 12/22/2014  . Internal hemorrhoids with bleeding and prolapse 11/04/2014  . Lactose intolerance 11/04/2014  . Hemorrhoid 09/11/2014  . Rectal bleeding 09/11/2014  . IBS (irritable bowel syndrome) 09/25/2013  . Hyperparathyroidism, primary ([place]) 05/26/2011  . Osteoporosis 05/09/2009  . POLYCYTHEMIA 04/23/2009  . IMPAIRED FASTING GLUCOSE 11/14/2008  . BREAST CANCER, HX OF 12/06/2007  . Anxiety state 10/13/2007  . Essential hypertension 10/13/2007  . INTERSTITIAL CYSTITIS 09/06/2007  . HLD (hyperlipidemia) 03/01/2007  . Backache 03/01/2007     Orientation RESPIRATION BLADDER Height & Weight     (Disoriented x4)  Normal Incontinent Weight: 143 lb 8.3 oz (65.1 kg) Height:  5\' 7"  (170.2 cm)  BEHAVIORAL SYMPTOMS/MOOD NEUROLOGICAL BOWEL NUTRITION STATUS  (Dementia)   Continent Diet(Please see DC Summary)  AMBULATORY STATUS COMMUNICATION OF NEEDS Skin   Extensive Assist Verbally Normal                       Personal Care Assistance Level of Assistance  Bathing, Feeding, Dressing Bathing Assistance: Maximum assistance Feeding assistance: Maximum assistance Dressing Assistance: Maximum assistance     Functional Limitations Info  Sight, Hearing, Speech Sight Info: Adequate Hearing Info: Adequate Speech Info: Adequate    SPECIAL CARE FACTORS FREQUENCY  PT (By licensed PT), OT (By licensed OT)     PT Frequency: 5x OT Frequency: 3x            Contractures Contractures Info: Not present    Additional Factors Info  Code Status, Allergies, Psychotropic Code Status Info: DNR Allergies Info: Anectine (Succinylcholine Chloride), Dilaudid (Hydromorphone Hcl), Hydrocodone-acetaminophen, Iodine, Naproxen Sodium, Oxycodone Hcl, Pentosan Polysulfate Sodium, Pseudoephedrine, Succinylcholine, Tape, Tramadol Hcl, Shellfish Allergy Psychotropic Info: Valium         Current Medications (07/20/2019):  This is the current hospital active medication list Current Facility-Administered Medications  Medication Dose Route Frequency Provider Last Rate Last Dose  . amLODipine (NORVASC) tablet 5 mg  5 mg Oral Daily Guadalupe Dawn, MD   5 mg at 07/19/19 0930  . diazepam (VALIUM) tablet 2 mg  2 mg Oral QHS Martyn Malay, MD      .  enoxaparin (LOVENOX) injection 40 mg  40 mg Subcutaneous Q24H Guadalupe Dawn, MD   40 mg at 07/18/19 1708  . oxyCODONE-acetaminophen (PERCOCET/ROXICET) 5-325 MG per tablet 1 tablet  1 tablet Oral Q8H Lyndee Hensen, MD   1 tablet at 07/19/19 1459  . ramelteon (ROZEREM) tablet 8 mg  8 mg Oral QHS  Martyn Malay, MD      . sodium chloride flush (NS) 0.9 % injection 3 mL  3 mL Intravenous Q12H Guadalupe Dawn, MD   3 mL at 07/19/19 [id number]     Discharge Medications: Please see discharge summary for a list of discharge medications.  Relevant Imaging Results:  Relevant Lab Results:   Additional Information SSN: [id number]     COVID negative on 11/24  Benard Halsted, LCSW

## 2019-07-21 LAB — CBC
HCT: 43.2 % (ref 36.0–46.0)
Hemoglobin: 14.6 g/dL (ref 12.0–15.0)
MCH: 31.1 pg (ref 26.0–34.0)
MCHC: 33.8 g/dL (ref 30.0–36.0)
MCV: 92.1 fL (ref 80.0–100.0)
Platelets: 214 10*3/uL (ref 150–400)
RBC: 4.69 MIL/uL (ref 3.87–5.11)
RDW: 11.8 % (ref 11.5–15.5)
WBC: 8.6 10*3/uL (ref 4.0–10.5)
nRBC: 0 % (ref 0.0–0.2)

## 2019-07-21 LAB — BASIC METABOLIC PANEL
Anion gap: 12 (ref 5–15)
BUN: 13 mg/dL (ref 8–23)
CO2: 24 mmol/L (ref 22–32)
Calcium: 8.9 mg/dL (ref 8.9–10.3)
Chloride: 105 mmol/L (ref 98–111)
Creatinine, Ser: 0.76 mg/dL (ref 0.44–1.00)
GFR calc Af Amer: >60 mL/min (ref >60)
GFR calc non Af Amer: >60 mL/min (ref >60)
Glucose, Bld: 122 mg/dL — ABNORMAL HIGH (ref 70–99)
Potassium: 3.2 mmol/L — ABNORMAL LOW (ref 3.5–5.1)
Sodium: 141 mmol/L (ref 135–145)

## 2019-07-21 MED ORDER — POTASSIUM CHLORIDE CRYS ER 20 MEQ PO TBCR
40.0000 meq | EXTENDED_RELEASE_TABLET | Freq: Once | ORAL | Status: AC
  Administered 2019-07-21: 40 meq via ORAL
  Filled 2019-07-21: qty 2
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
## 2019-07-21 NOTE — Progress Notes (Signed)
Paged MD about HR in low 100s and is asymptomatic. Pt has been sustaining this HR since admission. Will continue to monitor.

## 2019-07-21 NOTE — Progress Notes (Signed)
Family medicine progress note  Paged about patient sustained heart rate in the mid to high teens.  Patient eating food at current time with no chest pain or distress.  Reviewed heart rates during admission.  Has spiked heart rates up into the 110s to 120s every day from 5 PM to 7 PM.  Unclear etiology for these readings, but given the lack of symptoms we will continue to watch for now.  Guadalupe Dawn MD PGY-3 Family Medicine Resident
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
## 2019-07-22 LAB — POTASSIUM: Potassium: 4.1 mmol/L (ref 3.5–5.1)

## 2019-07-22 LAB — SARS CORONAVIRUS 2 (TAT 6-24 HRS): SARS Coronavirus 2: NEGATIVE

## 2019-07-22 LAB — GLUCOSE, CAPILLARY: Glucose-Capillary: 122 mg/dL — ABNORMAL HIGH (ref 70–99)

## 2019-07-22 MED ORDER — PRO-STAT SUGAR FREE PO LIQD
30.0000 mL | Freq: Two times a day (BID) | ORAL | Status: DC
  Administered 2019-07-22: 30 mL via ORAL
  Filled 2019-07-22: qty 30

## 2019-07-22 MED ORDER — DOCUSATE SODIUM 100 MG PO CAPS
100.0000 mg | ORAL_CAPSULE | Freq: Every day | ORAL | Status: DC
  Administered 2019-07-22: 100 mg via ORAL
  Filled 2019-07-22: qty 1
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
## 2019-07-22 NOTE — Progress Notes (Addendum)
Spoke with Carolynn Serve, patient's caregiver/decision maker, regarding patient's disposition.  Plan for patient to go to short-term rehab hopefully transition back to 24-hour home care.  Awaiting SNF acceptance.

## 2019-07-22 NOTE — TOC Progression Note (Signed)
Transition of Care [facility]) - Progression Note    Patient Details  Name: [name] MRN: [id number] Date of Birth: 1930-05-20  Transition of Care [facility]) CM/SW [place], [place] Phone Number: 07/22/2019, 2:08 PM  Clinical Narrative:     PASSR clinicals have not been requested yet due to the holiday weekend. CSW will submit them on Monday.   CSW will continue to follow and assist with TOC needs.     Barriers to Discharge: Continued Medical Work up  Expected Discharge Plan and Services   In-house Referral: Clinical Social Work     Living arrangements for the past 2 months: Single Family Home                                       Social Determinants of Health (SDOH) Interventions    Readmission Risk Interventions No flowsheet data found.

## 2019-07-23 MED ORDER — OXYCODONE-ACETAMINOPHEN 5-325 MG PO TABS: 1.0000 | ORAL_TABLET | Freq: Three times a day (TID) | ORAL | 0 refills | Status: DC | PRN

## 2019-07-23 MED ORDER — DIAZEPAM 5 MG PO TABS: ORAL_TABLET | ORAL | 0 refills | Status: DC

## 2019-07-23 MED ORDER — AMLODIPINE BESYLATE 5 MG PO TABS: 5.0000 mg | ORAL_TABLET | Freq: Every day | ORAL | Status: DC

## 2019-07-23 NOTE — Progress Notes (Signed)
Sitter discontinued at 0900 on 07/23/2019. Pt in chair with chair alarm on, call bell, and phone within reach.

## 2019-07-23 NOTE — Discharge Instructions (Signed)
You were hospitalized at [facility] due to confusion.  We expect this is from some of your prescription drugs which improved after decreasing Valium and Percocet.  We are so glad you are feeling better.  Be sure to follow-up with your regularly scheduled PCP appointments.  Please also be sure to follow-up with your PCP  at your earliest convenience.  Thank you for allowing [place] to take care of you.  -Go by the pharmacy and pick up your new prescriptions -Valium 2.5 mg in the morning and 5 mg in the evening.  Please take this medication with food -Percocet 5 mg up to 3 times a day as needed for pain. -Continue to have your primary care doctor decrease Valium and Percocet in the next couple weeks. -Blood pressures were elevated during admission.  Discussed this with your primary care physician.  You may need to start a blood pressure medication.  Take care, [facility] family medicine team

## 2019-07-23 NOTE — TOC Transition Note (Signed)
Transition of Care [facility]) - CM/SW Discharge Note   Patient Details  Name: THYME FLEITAS MRN: [id number] Date of Birth: Jul 21, 1930  Transition of Care [facility]) CM/SW Contact:  Pollie Friar, RN Phone Number: 07/23/2019, 3:51 PM   Clinical Narrative:    POA: Tena has decided to take the patient home with resumption of [facility] services through [facility]. CM updated Butch Penny with [facility].  Pt with orders for 3 in 1 and wheelchair. Zack with [employer] will deliver to the room. Tena to provide transport home.    Final next level of care: [place] Barriers to Discharge: No Barriers Identified   Patient Goals and CMS Choice Patient states their goals for this hospitalization and ongoing recovery are:: Return to baseline CMS Medicare.gov Compare Post Acute Care list provided to:: Patient Represenative (must comment) Choice offered to / list presented to : [place] / [place]  Discharge Placement                       Discharge Plan and Services In-house Referral: Clinical Social Work              DME Arranged: 3-N-1, Programmer, multimedia DME Agency: [employer] Date DME Agency Contacted: 07/23/19   Representative spoke with at DME Agency: [place]: PT, OT [place] Agency: [place] ([place]) Date [place]: 07/23/19   Representative spoke with at [place]: [place] ([place]) Interventions     Readmission Risk Interventions No flowsheet data found.
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
## 2019-07-23 NOTE — Progress Notes (Signed)
Pt telemetry removed without complication. Pt refusing to take any medications at time of discharge. Discharge instructions reviewed with patient and caregiver Tena. DME delivered to room and given to [facility]. Pt discharged from facility via wheelchair.

## 2019-07-23 NOTE — Care Management Important Message (Signed)
Important Message  Patient Details  Name: [name] MRN: [id number] Date of Birth: [birth year]   Medicare Important Message Given:  Yes     Brenda Samano Montine Circle 07/23/2019, 3:43 PM

## 2019-07-23 NOTE — Progress Notes (Signed)
Wheelchair:   Patient suffers from dementia which impairs their ability to perform daily activities like bathing, grooming and toileting in the home. A walker will not resolve  issue with performing activities of daily living. A wheelchair will allow patient to safely perform daily activities. Patient is not able to propel themselves in the home using a standard weight wheelchair due to general weakness. Patient can self propel in the lightweight wheelchair. Length of need 6 months .  Accessories: elevating leg rests (ELRs), wheel locks, extensions and anti-tippers.
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
## 2019-08-19 ENCOUNTER — Encounter (HOSPITAL_COMMUNITY): Payer: Self-pay | Admitting: Emergency Medicine

## 2019-08-19 ENCOUNTER — Emergency Department (HOSPITAL_COMMUNITY): Payer: Medicare Other

## 2019-08-19 ENCOUNTER — Other Ambulatory Visit: Payer: Self-pay

## 2019-08-19 ENCOUNTER — Emergency Department (HOSPITAL_COMMUNITY)
Admission: EM | Admit: 2019-08-19 | Discharge: 2019-08-19 | Disposition: A | Discharge status: Home / Self Care | Payer: Medicare Other | Attending: Emergency Medicine | Admitting: Emergency Medicine

## 2019-08-19 DIAGNOSIS — Z79899 Other long term (current) drug therapy: Secondary | ICD-10-CM | POA: Diagnosis not present

## 2019-08-19 DIAGNOSIS — Z8673 Personal history of transient ischemic attack (TIA), and cerebral infarction without residual deficits: Secondary | ICD-10-CM | POA: Insufficient documentation

## 2019-08-19 DIAGNOSIS — Z87891 Personal history of nicotine dependence: Secondary | ICD-10-CM | POA: Insufficient documentation

## 2019-08-19 DIAGNOSIS — R7989 Other specified abnormal findings of blood chemistry: Secondary | ICD-10-CM | POA: Insufficient documentation

## 2019-08-19 DIAGNOSIS — I10 Essential (primary) hypertension: Secondary | ICD-10-CM | POA: Diagnosis not present

## 2019-08-19 DIAGNOSIS — M542 Cervicalgia: Secondary | ICD-10-CM | POA: Diagnosis not present

## 2019-08-19 DIAGNOSIS — Z853 Personal history of malignant neoplasm of breast: Secondary | ICD-10-CM | POA: Insufficient documentation

## 2019-08-19 DIAGNOSIS — M25512 Pain in left shoulder: Secondary | ICD-10-CM | POA: Insufficient documentation

## 2019-08-19 DIAGNOSIS — F0390 Unspecified dementia without behavioral disturbance: Secondary | ICD-10-CM | POA: Diagnosis not present

## 2019-08-19 LAB — BASIC METABOLIC PANEL
Anion gap: 9 (ref 5–15)
BUN: 14 mg/dL (ref 8–23)
CO2: 27 mmol/L (ref 22–32)
Calcium: 9.1 mg/dL (ref 8.9–10.3)
Chloride: 102 mmol/L (ref 98–111)
Creatinine, Ser: 0.8 mg/dL (ref 0.44–1.00)
GFR calc Af Amer: >60 mL/min (ref >60)
GFR calc non Af Amer: >60 mL/min (ref >60)
Glucose, Bld: 170 mg/dL — ABNORMAL HIGH (ref 70–99)
Potassium: 4 mmol/L (ref 3.5–5.1)
Sodium: 138 mmol/L (ref 135–145)

## 2019-08-19 LAB — CBC
HCT: 45.8 % (ref 36.0–46.0)
Hemoglobin: 14.8 g/dL (ref 12.0–15.0)
MCH: 30.5 pg (ref 26.0–34.0)
MCHC: 32.3 g/dL (ref 30.0–36.0)
MCV: 94.2 fL (ref 80.0–100.0)
Platelets: 221 10*3/uL (ref 150–400)
RBC: 4.86 MIL/uL (ref 3.87–5.11)
RDW: 11.8 % (ref 11.5–15.5)
WBC: 7 10*3/uL (ref 4.0–10.5)
nRBC: 0 % (ref 0.0–0.2)

## 2019-08-19 LAB — TROPONIN I (HIGH SENSITIVITY): Troponin I (High Sensitivity): 6 ng/L (ref <18)

## 2019-08-19 MED ORDER — SODIUM CHLORIDE 0.9% FLUSH: 3.0000 mL | Freq: Once | INTRAVENOUS | Status: DC

## 2019-08-19 NOTE — Discharge Instructions (Addendum)
You were seen in the emergency department for left shoulder pain.  You had an x-ray blood work and an EKG that did not show any serious findings.  You should use ice or heat to the affected area and can use pain medicine as needed.  Please contact your primary care doctor for follow-up.  Return to the emergency department if any concerning symptoms.

## 2019-08-19 NOTE — ED Notes (Signed)
Pt in x-ray. Informed Radiology to transport pt to [address], after scan.

## 2019-08-19 NOTE — ED Notes (Signed)
Patient verbalizes understanding of discharge instructions. Opportunity for questioning and answers were provided. Armband removed by staff, pt discharged from ED.  
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
## 2019-08-19 NOTE — ED Triage Notes (Signed)
Pt to triage via [facility] from home.  Pt has hx of dementia and wanted to go to [facility]. Family told EMS to bring her to [facility].  C/o L shoulder pain radiating to L neck that woke her up during the night.  Pt has a heating pad on.  No known injury.  Pt reports taking a Percocet for pain.

## 2019-09-06 ENCOUNTER — Encounter: Payer: Medicare Other | Attending: Psychology | Admitting: Psychology

## 2019-09-06 ENCOUNTER — Other Ambulatory Visit: Payer: Self-pay

## 2019-09-06 ENCOUNTER — Encounter: Payer: Self-pay | Admitting: Psychology

## 2019-09-06 DIAGNOSIS — I6990 Unspecified sequelae of unspecified cerebrovascular disease: Secondary | ICD-10-CM | POA: Diagnosis present

## 2019-09-06 DIAGNOSIS — F0281 Dementia in other diseases classified elsewhere with behavioral disturbance: Secondary | ICD-10-CM | POA: Diagnosis present

## 2019-09-06 DIAGNOSIS — G459 Transient cerebral ischemic attack, unspecified: Secondary | ICD-10-CM

## 2019-09-06 DIAGNOSIS — I679 Cerebrovascular disease, unspecified: Secondary | ICD-10-CM

## 2019-09-06 DIAGNOSIS — I69319 Unspecified symptoms and signs involving cognitive functions following cerebral infarction: Secondary | ICD-10-CM

## 2019-09-06 DIAGNOSIS — F02818 Dementia in other diseases classified elsewhere, unspecified severity, with other behavioral disturbance: Secondary | ICD-10-CM
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
## 2019-10-25 ENCOUNTER — Emergency Department (HOSPITAL_COMMUNITY)
Admission: EM | Admit: 2019-10-25 | Discharge: 2019-10-25 | Disposition: A | Discharge status: Home / Self Care | Payer: Medicare Other | Attending: Emergency Medicine | Admitting: Emergency Medicine

## 2019-10-25 ENCOUNTER — Emergency Department (HOSPITAL_COMMUNITY): Payer: Medicare Other

## 2019-10-25 DIAGNOSIS — Z9011 Acquired absence of right breast and nipple: Secondary | ICD-10-CM | POA: Diagnosis not present

## 2019-10-25 DIAGNOSIS — R509 Fever, unspecified: Secondary | ICD-10-CM | POA: Diagnosis present

## 2019-10-25 DIAGNOSIS — Z87891 Personal history of nicotine dependence: Secondary | ICD-10-CM | POA: Insufficient documentation

## 2019-10-25 DIAGNOSIS — T7840XA Allergy, unspecified, initial encounter: Secondary | ICD-10-CM | POA: Diagnosis not present

## 2019-10-25 DIAGNOSIS — F0390 Unspecified dementia without behavioral disturbance: Secondary | ICD-10-CM | POA: Diagnosis not present

## 2019-10-25 DIAGNOSIS — Z8673 Personal history of transient ischemic attack (TIA), and cerebral infarction without residual deficits: Secondary | ICD-10-CM | POA: Insufficient documentation

## 2019-10-25 DIAGNOSIS — Z853 Personal history of malignant neoplasm of breast: Secondary | ICD-10-CM | POA: Insufficient documentation

## 2019-10-25 DIAGNOSIS — Z79899 Other long term (current) drug therapy: Secondary | ICD-10-CM | POA: Insufficient documentation

## 2019-10-25 DIAGNOSIS — I10 Essential (primary) hypertension: Secondary | ICD-10-CM | POA: Insufficient documentation

## 2019-10-25 LAB — URINALYSIS, ROUTINE W REFLEX MICROSCOPIC
Bilirubin Urine: NEGATIVE
Glucose, UA: NEGATIVE mg/dL
Hgb urine dipstick: NEGATIVE
Ketones, ur: NEGATIVE mg/dL
Nitrite: NEGATIVE
Protein, ur: NEGATIVE mg/dL
Specific Gravity, Urine: 1.011 (ref 1.005–1.030)
pH: 5 (ref 5.0–8.0)

## 2019-10-25 LAB — COMPREHENSIVE METABOLIC PANEL
ALT: 19 U/L (ref 0–44)
AST: 16 U/L (ref 15–41)
Albumin: 3.8 g/dL (ref 3.5–5.0)
Alkaline Phosphatase: 84 U/L (ref 38–126)
Anion gap: 11 (ref 5–15)
BUN: 17 mg/dL (ref 8–23)
CO2: 27 mmol/L (ref 22–32)
Calcium: 9 mg/dL (ref 8.9–10.3)
Chloride: 98 mmol/L (ref 98–111)
Creatinine, Ser: 1.08 mg/dL — ABNORMAL HIGH (ref 0.44–1.00)
GFR calc Af Amer: 53 mL/min — ABNORMAL LOW (ref >60)
GFR calc non Af Amer: 46 mL/min — ABNORMAL LOW (ref >60)
Glucose, Bld: 108 mg/dL — ABNORMAL HIGH (ref 70–99)
Potassium: 4.5 mmol/L (ref 3.5–5.1)
Sodium: 136 mmol/L (ref 135–145)
Total Bilirubin: 0.6 mg/dL (ref 0.3–1.2)
Total Protein: 6.4 g/dL — ABNORMAL LOW (ref 6.5–8.1)

## 2019-10-25 LAB — CBC WITH DIFFERENTIAL/PLATELET
Abs Immature Granulocytes: 0.04 10*3/uL (ref 0.00–0.07)
Basophils Absolute: 0.1 10*3/uL (ref 0.0–0.1)
Basophils Relative: 1 %
Eosinophils Absolute: 0.2 10*3/uL (ref 0.0–0.5)
Eosinophils Relative: 3 %
HCT: 47.3 % — ABNORMAL HIGH (ref 36.0–46.0)
Hemoglobin: 15.2 g/dL — ABNORMAL HIGH (ref 12.0–15.0)
Immature Granulocytes: 1 %
Lymphocytes Relative: 18 %
Lymphs Abs: 1.6 10*3/uL (ref 0.7–4.0)
MCH: 30.6 pg (ref 26.0–34.0)
MCHC: 32.1 g/dL (ref 30.0–36.0)
MCV: 95.4 fL (ref 80.0–100.0)
Monocytes Absolute: 0.7 10*3/uL (ref 0.1–1.0)
Monocytes Relative: 8 %
Neutro Abs: 6.2 10*3/uL (ref 1.7–7.7)
Neutrophils Relative %: 69 %
Platelets: 203 10*3/uL (ref 150–400)
RBC: 4.96 MIL/uL (ref 3.87–5.11)
RDW: 12.3 % (ref 11.5–15.5)
WBC: 8.8 10*3/uL (ref 4.0–10.5)
nRBC: 0 % (ref 0.0–0.2)

## 2019-10-25 LAB — TROPONIN I (HIGH SENSITIVITY)
Troponin I (High Sensitivity): 7 ng/L (ref <18)
Troponin I (High Sensitivity): 6 ng/L (ref <18)

## 2019-10-25 LAB — MAGNESIUM: Magnesium: 2.1 mg/dL (ref 1.7–2.4)

## 2019-10-25 LAB — TSH: TSH: 2.133 u[IU]/mL (ref 0.350–4.500)

## 2019-10-25 MED ORDER — METHYLPREDNISOLONE SODIUM SUCC 125 MG IJ SOLR
125.0000 mg | Freq: Once | INTRAMUSCULAR | Status: AC
  Administered 2019-10-25: 16:00:00 125 mg via INTRAVENOUS
  Filled 2019-10-25: qty 2

## 2019-10-25 MED ORDER — EPINEPHRINE 0.3 MG/0.3ML IJ SOAJ: 0.3000 mg | INTRAMUSCULAR | 0 refills | Status: AC | PRN

## 2019-10-25 MED ORDER — PREDNISONE 20 MG PO TABS: 40.0000 mg | ORAL_TABLET | Freq: Every day | ORAL | 0 refills | Status: AC

## 2019-10-25 NOTE — ED Triage Notes (Signed)
Pt arrives via ems from home with reports of ? Slurred speech vs allergic reaction. Pt states speech is normal but her tongue feels like theres a piece of bread under it that she cant get out. Pt did receive her 2nd covid vaccine yesterday. Given 2 doses of benadryl pta. Pt with no weakness. No neuro deficits noted per ems.  182/90 HR 76 RR 18 96% RA

## 2019-10-25 NOTE — ED Provider Notes (Signed)
[facility] EMERGENCY DEPARTMENT Provider Note   CSN: [id number] Arrival date & time: 10/25/19  1525     History No chief complaint on file.   JAZMANE LORETTE is a 86 y.o. female.  The history is provided by the patient, a caregiver, medical records and the EMS personnel. No language interpreter was used.  Fever Max temp prior to arrival:  102 Temp source:  Oral Severity:  Moderate Onset quality:  Gradual Duration:  1 day Timing:  Constant Progression:  Resolved Chronicity:  New Relieved by:  Ibuprofen and acetaminophen Worsened by:  Nothing Ineffective treatments:  None tried Associated symptoms: no chest pain, no chills, no confusion, no congestion, no cough, no diarrhea, no dysuria, no headaches, no nausea, no rash, no rhinorrhea, no somnolence, no sore throat and no vomiting   Risk factors comment:  Covid vaccine yesterday      Past Medical History:  Diagnosis Date  . Adenomatous colon polyp 04/2005  . Allergic    Anectine  . ANXIETY 10/13/2007  . BACK PAIN 03/01/2007  . BREAST CANCER, HX OF 12/06/2007  . Cancer [facility])    RIGHT mastectomy with node dissection  . Complication of anesthesia    family aggergy to annectine  . Compression fracture of L4 lumbar vertebra 02/2013  . CONSTIPATION 01/06/2009  . Dementia ([place]) 2020   Per POA  . DEPRESSION 10/13/2007  . Diverticular disease   . DIVERTICULOSIS, COLON 05/11/2005  . External hemorrhoids without mention of complication [id number]  . FATIGUE 08/07/2008  . HEMORRHOIDS, INTERNAL 05/11/2005  . Hypercalcemia 04/23/2009  . HYPERLIPIDEMIA NEC/NOS 03/01/2007  . HYPERTENSION 10/13/2007  . Impaired fasting glucose 11/14/2008  . INTERSTITIAL CYSTITIS 09/06/2007  . Irritable bowel syndrome 01/06/2009  . LIVER MASS 05/15/2009  . OSTEOPOROSIS 05/09/2009  . PARESTHESIA 09/06/2007  . POLYCYTHEMIA 04/23/2009  . STENOSIS, SPINAL, UNSPC REGION 03/01/2007  . Thyroid disease     Patient Active Problem List   Diagnosis Date Noted  . Syncope 07/17/2019  . Dementia ([place]) 2020  . Cognitive deficit due to recent stroke 03/08/2017  . Alteration of sensation as late effect of stroke 03/08/2017  . Gait disturbance, post-stroke 03/08/2017  . Acute arterial ischemic stroke, vertebrobasilar, thalamic, left ([place]) 01/10/2017  . Neurologic gait disorder   . Depression   . Benign essential HTN   . History of breast cancer   . Stroke (cerebrum) ([place]) 01/08/2017  . TIA (transient ischemic attack) 12/16/2016  . Dysarthria 12/16/2016  . Leg weakness 12/16/2016  . EKG abnormalities 12/16/2016  . Chronic pain 12/16/2016  . Elevated blood pressure 12/22/2014  . Internal hemorrhoids with bleeding and prolapse 11/04/2014  . Lactose intolerance 11/04/2014  . Hemorrhoid 09/11/2014  . Rectal bleeding 09/11/2014  . IBS (irritable bowel syndrome) 09/25/2013  . Hyperparathyroidism, primary ([place]) 05/26/2011  . Osteoporosis 05/09/2009  . POLYCYTHEMIA 04/23/2009  . IMPAIRED FASTING GLUCOSE 11/14/2008  . BREAST CANCER, HX OF 12/06/2007  . Anxiety state 10/13/2007  . Essential hypertension 10/13/2007  . INTERSTITIAL CYSTITIS 09/06/2007  . HLD (hyperlipidemia) 03/01/2007  . Backache 03/01/2007    Past Surgical History:  Procedure Laterality Date  . ABDOMINAL HYSTERECTOMY    . BACK SURGERY    . BREAST SURGERY     mastectomy -right  . CERVICAL SPINE SURGERY    . CHOLECYSTECTOMY    . COLONOSCOPY W/ BIOPSIES    . cortisone injection  10/16/12   left knee  . FIXATION KYPHOPLASTY LUMBAR SPINE    . HEMORRHOID BANDING  2015-16  . PARATHYROID EXPLORATION  06/29/2011   Procedure: PARATHYROID EXPLORATION;  Surgeon: Earnstine Regal, MD;  Location: [facility] ORS;  Service: General;  Laterality: Left;  Left Superior Parathyroidectomy  . PARATHYROIDECTOMY  06/29/11     OB History   No obstetric history on file.     Family History  Problem Relation Age of Onset  . Heart disease Sister   . Hypertension Sister   . Asthma  Sister   . Breast cancer Maternal Aunt   . Ovarian cancer Paternal Aunt   . Colon cancer Neg Hx   . Esophageal cancer Neg Hx   . Pancreatic cancer Neg Hx   . Rectal cancer Neg Hx   . Stomach cancer Neg Hx     Social History   Tobacco Use  . Smoking status: Former Smoker    Packs/day: 1.00    Years: 42.00    Pack years: 42.00    Types: Cigarettes    Quit date: 08/23/1977    Years since quitting: 42.2  . Smokeless tobacco: Never Used  Substance Use Topics  . Alcohol use: No  . Drug use: No    Home Medications Prior to Admission medications   Medication Sig Start Date End Date Taking? Authorizing Provider  acetaminophen (TYLENOL) 500 MG tablet Take 1,000 mg by mouth every 6 (six) hours as needed for mild pain.    [provider]  Alum & Mag Hydroxide-Simeth (GELUSIL PO) Take 1 tablet by mouth daily as needed (acid reflux).    [provider]  Ascorbic Acid (VITAMIN C PO) Take 1 tablet by mouth daily as needed (when not eating enough fruit).    [provider]  CALCIUM-MAGNESIUM PO Take 1 tablet by mouth daily.    [provider]  celecoxib (CELEBREX) 200 MG capsule Take 1 capsule (200 mg total) by mouth daily as needed (arthritis pain). 01/18/17   Angiulli, Lavon Paganini, PA-C  Cholecalciferol (VITAMIN D3) 1000 units CAPS Take 1 capsule (1,000 Units total) by mouth daily. 01/18/17   Angiulli, Lavon Paganini, PA-C  diazepam (VALIUM) 5 MG tablet Take 1/2 tablet in AM (2.5mg ) and 1 tablet at night (5mg ). 07/23/19   Brimage, Ronnette Juniper, DO  estradiol (ESTRACE) 0.1 MG/GM vaginal cream Place 1 Applicatorful vaginally daily as needed (for dryness).     [provider]  fluticasone (FLONASE) 50 MCG/ACT nasal spray Place 2 sprays into both nostrils daily. Patient taking differently: Place 2 sprays into both nostrils daily as needed for allergies.  09/06/14   Hoyt Koch, MD  Multiple Vitamin (MULTIVITAMIN WITH MINERALS) TABS tablet Take 1 tablet by mouth  daily as needed (when not eating well).    [provider]  oxyCODONE-acetaminophen (PERCOCET/ROXICET) 5-325 MG tablet Take 1 tablet by mouth every 8 (eight) hours as needed for severe pain. 07/23/19   Brimage, Ronnette Juniper, DO  Red Yeast Rice Extract 600 MG CAPS Take 1 capsule by mouth daily.    [provider]  VITAMIN E PO Take 1 capsule by mouth daily.    [provider]    Allergies    Anectine [succinylcholine chloride], Dilaudid [hydromorphone hcl], Hydrocodone-acetaminophen, Iodine, Naproxen sodium, Oxycodone hcl, Pentosan polysulfate sodium, Pseudoephedrine, Succinylcholine, Tape, Tramadol hcl, and Shellfish allergy  Review of Systems   Review of Systems  Constitutional: Positive for fatigue and fever. Negative for appetite change, chills and diaphoresis.  HENT: Negative for congestion, drooling, facial swelling, rhinorrhea, sore throat, trouble swallowing and voice change.  Foreign body sensation in mouth and possible tongue swelling  Eyes: Negative for visual disturbance.  Respiratory: Negative for cough, chest tightness, shortness of breath and wheezing.   Cardiovascular: Negative for chest pain, palpitations and leg swelling.  Gastrointestinal: Negative for abdominal pain, constipation, diarrhea, nausea and vomiting.  Genitourinary: Negative for difficulty urinating, dysuria, flank pain and frequency.  Musculoskeletal: Negative for back pain, neck pain and neck stiffness.  Skin: Negative for rash and wound.  Neurological: Negative for dizziness, syncope (near), weakness, light-headedness, numbness and headaches.  Psychiatric/Behavioral: Negative for agitation and confusion.  All other systems reviewed and are negative.   Physical Exam Updated Vital Signs BP (!) 174/89   Pulse 94   Temp 97.7 F (36.5 C) (Oral)   Resp 17   SpO2 95%   Physical Exam Vitals and nursing note reviewed.  Constitutional:      General: She is not in acute  distress.    Appearance: She is obese. She is not ill-appearing, toxic-appearing or diaphoretic.  HENT:     Head: Normocephalic.     Nose: Nose normal. No congestion or rhinorrhea.     Mouth/Throat:     Mouth: Mucous membranes are moist.     Pharynx: Oropharynx is clear. No oropharyngeal exudate or posterior oropharyngeal erythema.  Eyes:     Extraocular Movements: Extraocular movements intact.     Conjunctiva/sclera: Conjunctivae normal.     Pupils: Pupils are equal, round, and reactive to light.  Cardiovascular:     Rate and Rhythm: Normal rate.     Pulses: Normal pulses.  Pulmonary:     Effort: Pulmonary effort is normal.     Breath sounds: No wheezing, rhonchi or rales.  Chest:     Chest wall: No tenderness.  Abdominal:     General: Abdomen is flat.     Tenderness: There is no abdominal tenderness. There is no right CVA tenderness, left CVA tenderness, guarding or rebound.  Musculoskeletal:        General: No tenderness or signs of injury.     Cervical back: No tenderness.     Right lower leg: No edema.     Left lower leg: No edema.  Skin:    Capillary Refill: Capillary refill takes less than 2 seconds.     Coloration: Skin is not pale.     Findings: No erythema or rash.  Neurological:     General: No focal deficit present.     Mental Status: She is alert.     Cranial Nerves: No cranial nerve deficit.     Sensory: No sensory deficit.     Motor: No weakness.  Psychiatric:        Mood and Affect: Mood normal.     ED Results / Procedures / Treatments   Labs (all labs ordered are listed, but only abnormal results are displayed) Labs Reviewed  CBC WITH DIFFERENTIAL/PLATELET - Abnormal; Notable for the following components:      Result Value   Hemoglobin 15.2 (*)    HCT 47.3 (*)    All other components within normal limits  COMPREHENSIVE METABOLIC PANEL - Abnormal; Notable for the following components:   Glucose, Bld 108 (*)    Creatinine, Ser 1.08 (*)    Total  Protein 6.4 (*)    GFR calc non Af Amer 46 (*)    GFR calc Af Amer 53 (*)    All other components within normal limits  URINALYSIS, ROUTINE W REFLEX MICROSCOPIC - Abnormal; Notable  for the following components:   Leukocytes,Ua SMALL (*)    Bacteria, UA RARE (*)    All other components within normal limits  URINE CULTURE  MAGNESIUM  TSH  TROPONIN I (HIGH SENSITIVITY)  TROPONIN I (HIGH SENSITIVITY)    EKG EKG Interpretation  Date/Time:  Thursday October 25 2019 15:31:55 EST Ventricular Rate:  78 PR Interval:    QRS Duration: 132 QT Interval:  438 QTC Calculation: 499 R Axis:   56 Text Interpretation: Sinus rhythm Right bundle branch block When comapred to prior, no significant changes seen. No STEMI Confirmed by Antony Blackbird [phone]) on 10/25/2019 4:24:02 PM   Radiology DG Chest 2 View  Result Date: 10/25/2019 CLINICAL DATA:  Fever.  Slurred speech.  Abnormal tongue sensation. EXAM: CHEST - 2 VIEW COMPARISON:  06/27/2019 FINDINGS: Heart size upper limits of normal. Chronic aortic atherosclerosis. Chronic interstitial lung markings without evidence of acute infiltrate, collapse or effusion. Old healed rib fractures, right more numerous than left. Previous axillary dissection on the right. IMPRESSION: No active cardiopulmonary disease. Heart size upper limits of normal. Aortic atherosclerosis. Chronic interstitial lung markings. Old healed rib fractures. Electronically Signed   By: Nelson Chimes M.D.   On: 10/25/2019 16:55    Procedures Procedures (including critical care time)  Medications Ordered in ED Medications  methylPREDNISolone sodium succinate (SOLU-MEDROL) 125 mg/2 mL injection 125 mg (125 mg Intravenous Given 10/25/19 1613)    ED Course  I have reviewed the triage vital signs and the nursing notes.  Pertinent labs & imaging results that were available during my care of the patient were reviewed by me and considered in my medical decision making (see chart for  details).    MDM Rules/Calculators/A&P                      CHRYSTINE OMOTO is a 86 y.o. female with a past medical history significant for hypertension, hyperlipidemia, prior TIA/stroke, vascular dementia, hyperparathyroidism, breast cancer, osteoporosis, and anxiety who presents with generalized fatigue and weakness last night with a near syncopal episode as well as fever today and sensation of foreign body in mouth.  Patient reports that she is only here because she has something stuck "under her tongue".  She reports that she does not member what she was eating but she thinks a piece of something got stuck on her tongue has been moving around since then.  She reports that her speech sounded different because she was trying to get something out from under her tongue.  She says that she tried to wash it out but could not get what ever was out.  She then called for help and they brought her here.  She reports she does not have any difficulty with speech, vision, or other neurologic complaints.  She does say that she got her Covid shot yesterday and has been feeling "off" since then.  The healthcare power attorney arrived and was able provide further formation.  They report that yesterday she did get her shot and has been feeling fatigued and tired.  She had a near syncopal episode with ambulation last night but did not fall hit her head.  She did not lose consciousness.  She collapsed to the ground for generalized weakness.  She reports from that standpoint she is feeling much better but when the healthcare power attorney evaluate the patient when she said symptoms in her throat, the first thought she actually was having allergic reaction with swelling of her  tongue.  They gave her 2 doses of Benadryl and the swelling has resolved although patient still feels like something is under her tongue.  The [facility] POA does report that her tongue looks normal now compared to before the Benadryl.  They report the  patient temperature was 102 earlier today but she was not having a cough, congestion, urinary symptoms, or GI symptoms.  On exam, lungs are clear and chest is nontender.  Abdomen is nontender.  Patient moving extremities normally.  She has normal sensation and strength in extremities.  Normal finger-nose-finger testing.  Clear speech, symmetric smile.  On extensive look in her mouth and throat, I did not see any foreign bodies.  I did use both tongue depressor, Q-tips, water, and suction and looked under the tongue and in the posterior mouth.  After doing this, patient feels that "it moved" and is feeling somewhat better.  She did not have any stridor.  Exam otherwise unremarkable.  Had a long shared decision made conversation with healthcare POA.  Per the patient's allergic reaction potentially with the swelling of the tongue that responded to Benadryl and the sensation of something different with her tongue, we agreed to give the patient some steroids and watch her as she already received the antihistamines.  Symptoms are improving and she is of no difficulty breathing or swallowing, do not feel she needs epi at this time.  For the patient's fever, near syncope, and fatigue, we will get labs including TSH, urine, chest x-ray, and screening labs to make sure there is no other evidence of infection.  Suspect all symptoms related to the recent Covid shot.  We also decided to hold on CT of the head or neck as the symptoms are not consistent with TIA or stroke and she is not really having difficulty with aphasia, dysarthria, or any focal neuro deficits.  The patient and [facility] POA agree with to hold on CT imaging at this time.  We will monitor the patient, if work-up is reassuring and her sensation of tongue swelling and abnormal tongue feeling has improved, anticipate she will be stable for discharge home.   8:45 PM Patient reports that after the steroid injection, her tongue sensation completely resolved.   She suspects it is more allergic reaction and I agree.  On reinspection, I still do not see any foreign body and her tongue did not appear any different to me but she reports it feels better.  Due to this, patient may have had a reaction to the coronavirus shot yesterday as she denied any other new medications or changes.  Given the resolution of symptoms and monitoring for several hours, we feel she is safe for discharge home.  They would like to go home.  Her work-up was otherwise reassuring.  We will give prescription for steroids for several days and a prescription for EpiPen.  Patient and caregiver agree with plan of care.  They will follow up with PCP and understood return precautions.  The other questions or concerns and patient was discharged in good condition.  Final Clinical Impression(s) / ED Diagnoses Final diagnoses:  Allergic reaction, initial encounter    Rx / DC Orders ED Discharge Orders    None     Clinical Impression: 1. Allergic reaction, initial encounter     Disposition: Discharge  Condition: Good  I have discussed the results, Dx and Tx plan with the pt(& family if present). He/she/they expressed understanding and agree(s) with the plan. Discharge instructions discussed at  great length. Strict return precautions discussed and pt &/or family have verbalized understanding of the instructions. No further questions at time of discharge.    New Prescriptions   EPINEPHRINE 0.3 MG/0.3 ML IJ SOAJ INJECTION    Inject 0.3 mLs (0.3 mg total) into the muscle as needed for anaphylaxis.   PREDNISONE (DELTASONE) 20 MG TABLET    Take 2 tablets (40 mg total) by mouth daily for 4 days.    Follow Up: Your PCP     Anon Raices [address] [id number] mc [place]       Melita Villalona, Gwenyth Allegra, MD 10/25/19 2051

## 2019-10-25 NOTE — ED Notes (Signed)
MD educated patient and caregiver on discharge plan and instructions for follow-up.  Patient and caregiver verbalized understanding

## 2019-10-25 NOTE — Discharge Instructions (Signed)
Your history and exam today were consistent with tongue abnormal sensation possible related to allergic reaction.  We did not see a foreign body in your mouth when we inspected it and your work-up was otherwise reassuring.  As your symptoms resolved after the steroids as well as helping from the Benadryl before, we do suspect it was late to allergic reaction instead.  After over 5 hours of monitoring, he did not have worsened symptoms and your breathing and swallowing normally.  We feel you are safe for discharge home and with our shared decision-making conversation you agreed.  Please follow-up with your primary doctor for further allergy testing and management.  If any symptoms change or worsen, return to nearest emergency room.  Please take steroids for the next 4 days starting tomorrow.

## 2019-10-26 LAB — URINE CULTURE

## 2020-03-01 ENCOUNTER — Emergency Department (HOSPITAL_COMMUNITY): Payer: Medicare Other

## 2020-03-01 ENCOUNTER — Encounter (HOSPITAL_COMMUNITY): Payer: Self-pay

## 2020-03-01 ENCOUNTER — Emergency Department (HOSPITAL_COMMUNITY)
Admission: EM | Admit: 2020-03-01 | Discharge: 2020-03-04 | Disposition: A | Discharge status: Home / Self Care | Payer: Medicare Other | Attending: Emergency Medicine | Admitting: Emergency Medicine

## 2020-03-01 ENCOUNTER — Other Ambulatory Visit: Payer: Self-pay

## 2020-03-01 DIAGNOSIS — Y998 Other external cause status: Secondary | ICD-10-CM | POA: Diagnosis not present

## 2020-03-01 DIAGNOSIS — I10 Essential (primary) hypertension: Secondary | ICD-10-CM | POA: Insufficient documentation

## 2020-03-01 DIAGNOSIS — Z20822 Contact with and (suspected) exposure to covid-19: Secondary | ICD-10-CM | POA: Diagnosis not present

## 2020-03-01 DIAGNOSIS — Z853 Personal history of malignant neoplasm of breast: Secondary | ICD-10-CM | POA: Diagnosis not present

## 2020-03-01 DIAGNOSIS — Z79899 Other long term (current) drug therapy: Secondary | ICD-10-CM | POA: Diagnosis not present

## 2020-03-01 DIAGNOSIS — Y9301 Activity, walking, marching and hiking: Secondary | ICD-10-CM | POA: Insufficient documentation

## 2020-03-01 DIAGNOSIS — Y9289 Other specified places as the place of occurrence of the external cause: Secondary | ICD-10-CM | POA: Diagnosis not present

## 2020-03-01 DIAGNOSIS — W231XXA Caught, crushed, jammed, or pinched between stationary objects, initial encounter: Secondary | ICD-10-CM | POA: Insufficient documentation

## 2020-03-01 DIAGNOSIS — R262 Difficulty in walking, not elsewhere classified: Secondary | ICD-10-CM | POA: Insufficient documentation

## 2020-03-01 DIAGNOSIS — F0390 Unspecified dementia without behavioral disturbance: Secondary | ICD-10-CM | POA: Insufficient documentation

## 2020-03-01 DIAGNOSIS — S79911A Unspecified injury of right hip, initial encounter: Secondary | ICD-10-CM | POA: Diagnosis present

## 2020-03-01 DIAGNOSIS — T148XXA Other injury of unspecified body region, initial encounter: Secondary | ICD-10-CM

## 2020-03-01 DIAGNOSIS — W19XXXA Unspecified fall, initial encounter: Secondary | ICD-10-CM

## 2020-03-01 DIAGNOSIS — Z87891 Personal history of nicotine dependence: Secondary | ICD-10-CM | POA: Diagnosis not present

## 2020-03-01 MED ORDER — ACETAMINOPHEN 500 MG PO TABS
1000.0000 mg | ORAL_TABLET | Freq: Once | ORAL | Status: AC
  Administered 2020-03-01: 1000 mg via ORAL
  Filled 2020-03-01: qty 2

## 2020-03-01 MED ORDER — ACETAMINOPHEN 500 MG PO TABS: 500.0000 mg | ORAL_TABLET | Freq: Once | ORAL | Status: DC

## 2020-03-01 MED ORDER — DIAZEPAM 5 MG PO TABS
5.0000 mg | ORAL_TABLET | Freq: Once | ORAL | Status: AC
  Administered 2020-03-01: 5 mg via ORAL
  Filled 2020-03-01: qty 1

## 2020-03-01 MED ORDER — METOPROLOL TARTRATE 25 MG PO TABS
50.0000 mg | ORAL_TABLET | Freq: Once | ORAL | Status: AC
  Administered 2020-03-01: 50 mg via ORAL
  Filled 2020-03-01: qty 2

## 2020-03-01 NOTE — ED Provider Notes (Signed)
[facility] Provider Note   CSN: [id number] Arrival date & time: 03/01/20  1832     History Chief Complaint  Patient presents with  . Fall    [name] is a 86 y.o. female.  The history is provided by the patient and a caregiver.        [name] is a 86 y.o. female, with a history of anxiety, back pain, dementia, HTN, hyperlipidemia, presenting to the ED with concern for injury to the right hip that occurred around 7 AM this morning. Patient is accompanied by her caregiver (and POA) with whom the patient lives. Per caregiver, patient was walking using the walker, turned suddenly, and fell onto her right side.  She landed on her right hip and also hit the right side of her face. Patient denies any pain or complaints, however, caregiver states patient was complaining of pain to the right hip earlier today, had tenderness, and hesitated to put weight on the right leg.  Denies other head injury, LOC, neck/back pain, other extremity pain, numbness, weakness, or any other complaints.    Past Medical History:  Diagnosis Date  . Adenomatous colon polyp 04/2005  . Allergic    Anectine  . ANXIETY 10/13/2007  . BACK PAIN 03/01/2007  . BREAST CANCER, HX OF 12/06/2007  . Cancer [facility])    RIGHT mastectomy with node dissection  . Complication of anesthesia    family aggergy to annectine  . Compression fracture of L4 lumbar vertebra 02/2013  . CONSTIPATION 01/06/2009  . Dementia ([place]) 2020   Per POA  . DEPRESSION 10/13/2007  . Diverticular disease   . DIVERTICULOSIS, COLON 05/11/2005  . External hemorrhoids without mention of complication 7/67/3419  . FATIGUE 08/07/2008  . HEMORRHOIDS, INTERNAL 05/11/2005  . Hypercalcemia 04/23/2009  . HYPERLIPIDEMIA NEC/NOS 03/01/2007  . HYPERTENSION 10/13/2007  . Impaired fasting glucose 11/14/2008  . INTERSTITIAL CYSTITIS 09/06/2007  . Irritable bowel syndrome 01/06/2009  . LIVER MASS 05/15/2009    . OSTEOPOROSIS 05/09/2009  . PARESTHESIA 09/06/2007  . POLYCYTHEMIA 04/23/2009  . STENOSIS, SPINAL, UNSPC REGION 03/01/2007  . Thyroid disease     Patient Active Problem List   Diagnosis Date Noted  . Syncope 07/17/2019  . Dementia ([place]) 2020  . Cognitive deficit due to recent stroke 03/08/2017  . Alteration of sensation as late effect of stroke 03/08/2017  . Gait disturbance, post-stroke 03/08/2017  . Acute arterial ischemic stroke, vertebrobasilar, thalamic, left ([place]) 01/10/2017  . Neurologic gait disorder   . Depression   . Benign essential HTN   . History of breast cancer   . Stroke (cerebrum) ([place]) 01/08/2017  . TIA (transient ischemic attack) 12/16/2016  . Dysarthria 12/16/2016  . Leg weakness 12/16/2016  . EKG abnormalities 12/16/2016  . Chronic pain 12/16/2016  . Elevated blood pressure 12/22/2014  . Internal hemorrhoids with bleeding and prolapse 11/04/2014  . Lactose intolerance 11/04/2014  . Hemorrhoid 09/11/2014  . Rectal bleeding 09/11/2014  . IBS (irritable bowel syndrome) 09/25/2013  . Hyperparathyroidism, primary ([place]) 05/26/2011  . Osteoporosis 05/09/2009  . POLYCYTHEMIA 04/23/2009  . IMPAIRED FASTING GLUCOSE 11/14/2008  . BREAST CANCER, HX OF 12/06/2007  . Anxiety state 10/13/2007  . Essential hypertension 10/13/2007  . INTERSTITIAL CYSTITIS 09/06/2007  . HLD (hyperlipidemia) 03/01/2007  . Backache 03/01/2007    Past Surgical History:  Procedure Laterality Date  . ABDOMINAL HYSTERECTOMY    . BACK SURGERY    . BREAST SURGERY     mastectomy -  right  . CERVICAL SPINE SURGERY    . CHOLECYSTECTOMY    . COLONOSCOPY W/ BIOPSIES    . cortisone injection  10/16/12   left knee  . FIXATION KYPHOPLASTY LUMBAR SPINE    . HEMORRHOID BANDING  2015-16  . PARATHYROID EXPLORATION  06/29/2011   Procedure: PARATHYROID EXPLORATION;  Surgeon: Earnstine Regal, MD;  Location: [facility];  Service: General;  Laterality: Left;  Left Superior Parathyroidectomy  .  PARATHYROIDECTOMY  06/29/11     OB History   No obstetric history on file.     Family History  Problem Relation Age of Onset  . Heart disease Sister   . Hypertension Sister   . Asthma Sister   . Breast cancer Maternal Aunt   . Ovarian cancer Paternal Aunt   . Colon cancer Neg Hx   . Esophageal cancer Neg Hx   . Pancreatic cancer Neg Hx   . Rectal cancer Neg Hx   . Stomach cancer Neg Hx     Social History   Tobacco Use  . Smoking status: Former Smoker    Packs/day: 1.00    Years: 42.00    Pack years: 42.00    Types: Cigarettes    Quit date: 08/23/1977    Years since quitting: 42.5  . Smokeless tobacco: Never Used  Vaping Use  . Vaping Use: Never used  Substance Use Topics  . Alcohol use: No  . Drug use: No    Home Medications Prior to Admission medications   Medication Sig Start Date End Date Taking? Authorizing Provider  carisoprodol (SOMA) 250 MG tablet Take 250 mg by mouth in the morning.   Yes [provider]  carisoprodol (SOMA) 350 MG tablet Take 350 mg by mouth at bedtime.   Yes [provider]  metoprolol tartrate (LOPRESSOR) 50 MG tablet Take 50 mg by mouth 2 (two) times daily.   Yes [provider]  mirabegron ER (MYRBETRIQ) 50 MG TB24 tablet Take 50 mg by mouth daily.   Yes [provider]  acetaminophen (TYLENOL) 500 MG tablet Take 1,000 mg by mouth every 6 (six) hours as needed for mild pain.    [provider]  Alum & Mag Hydroxide-Simeth (GELUSIL PO) Take 1 tablet by mouth daily as needed (acid reflux).    [provider]  Ascorbic Acid (VITAMIN C PO) Take 1 tablet by mouth daily as needed (when not eating enough fruit).    [provider]  CALCIUM-MAGNESIUM PO Take 1 tablet by mouth daily.    [provider]  celecoxib (CELEBREX) 200 MG capsule Take 1 capsule (200 mg total) by mouth daily as needed (arthritis pain). 01/18/17   Angiulli, Lavon Paganini, PA-C  Cholecalciferol (VITAMIN D3)  1000 units CAPS Take 1 capsule (1,000 Units total) by mouth daily. 01/18/17   Angiulli, Lavon Paganini, PA-C  diazepam (VALIUM) 5 MG tablet Take 1/2 tablet in AM (2.5mg ) and 1 tablet at night (5mg ). 07/23/19   Brimage, Vondra, DO  EPINEPHrine 0.3 mg/0.3 mL IJ SOAJ injection Inject 0.3 mLs (0.3 mg total) into the muscle as needed for anaphylaxis. 10/25/19   Tegeler, Gwenyth Allegra, MD  estradiol (ESTRACE) 0.1 MG/GM vaginal cream Place 1 Applicatorful vaginally daily as needed (for dryness).     [provider]  fluticasone (FLONASE) 50 MCG/ACT nasal spray Place 2 sprays into both nostrils daily. Patient taking differently: Place 2 sprays into both nostrils daily as needed for allergies.  09/06/14   Hoyt Koch, MD  Multiple Vitamin (MULTIVITAMIN WITH MINERALS) TABS tablet Take 1 tablet by mouth daily as needed (when not eating well).    [provider]  oxyCODONE-acetaminophen (PERCOCET/ROXICET) 5-325 MG tablet Take 1 tablet by mouth every 8 (eight) hours as needed for severe pain. 07/23/19   Brimage, Ronnette Juniper, DO  Red Yeast Rice Extract 600 MG CAPS Take 1 capsule by mouth daily.    [provider]  VITAMIN E PO Take 1 capsule by mouth daily.    [provider]    Allergies    Anectine [succinylcholine chloride], Dilaudid [hydromorphone hcl], Hydrocodone-acetaminophen, Iodine, Naproxen sodium, Oxycodone hcl, Pentosan polysulfate sodium, Pseudoephedrine, Succinylcholine, Tape, Tramadol hcl, and Shellfish allergy  Review of Systems   Review of Systems  Constitutional: Negative for diaphoresis.  Respiratory: Negative for shortness of breath.   Cardiovascular: Negative for chest pain.  Gastrointestinal: Negative for abdominal pain, diarrhea, nausea and vomiting.  Musculoskeletal: Positive for arthralgias. Negative for back pain and neck pain.  Neurological: Negative for dizziness, syncope, weakness and numbness.  All other systems reviewed and are  negative.   Physical Exam Updated Vital Signs BP (!) 184/79 (BP Location: Left Arm)   Pulse 70   Temp 98.4 F (36.9 C) (Oral)   Resp 20   SpO2 95%   Physical Exam Vitals and nursing note reviewed.  Constitutional:      General: She is not in acute distress.    Appearance: She is well-developed. She is not diaphoretic.  HENT:     Head: Normocephalic and atraumatic.     Nose: Nose normal.     Mouth/Throat:     Mouth: Mucous membranes are moist.     Pharynx: Oropharynx is clear.     Comments: Dentition appears to be intact and stable.  No noted area of intraoral swelling or fluctuance.  No trismus or noted abnormal phonation.  Mouth opening to at least 3 finger widths.  Handles oral secretions without difficulty.  No noted facial swelling or tenderness.  No swelling or tenderness to the submental or submandibular regions.  Full movement, including lateral movement, and the mandible without noted difficulty or pain.  No swelling or tenderness into the soft tissues of the neck. Eyes:     Conjunctiva/sclera: Conjunctivae normal.  Cardiovascular:     Rate and Rhythm: Normal rate and regular rhythm.     Pulses: Normal pulses.          Radial pulses are 2+ on the right side and 2+ on the left side.       Posterior tibial pulses are 2+ on the right side and 2+ on the left side.     Heart sounds: Normal heart sounds.     Comments: Tactile temperature in the extremities appropriate and equal bilaterally. Pulmonary:     Effort: Pulmonary effort is normal. No respiratory distress.     Breath sounds: Normal breath sounds.  Abdominal:     Palpations: Abdomen is soft.     Tenderness: There is no abdominal tenderness. There is no guarding.  Musculoskeletal:     Cervical back: Neck supple.     Right lower leg: No edema.     Left lower leg: No edema.     Comments: Patient has some tenderness to the right anterior and lateral hip without noted deformity, instability, color abnormality, or  swelling. No tenderness, swelling, or deformity on examination of the rest of the right lower extremity, left lower extremity, and the bilateral upper extremities. Each of the  major joints of the upper and lower extremities, other than the right hip, were ranged without pain or complaint. Normal motor function intact in all extremities. No midline spinal tenderness.   Skin:    General: Skin is warm and dry.     Capillary Refill: Capillary refill takes less than 2 seconds.  Neurological:     Mental Status: She is alert.     Comments: Sensation to light touch grossly intact in each of the extremities. Grip strength equal bilaterally. Strength 5/5 in the bilateral upper extremities and the bilateral ankles.  Psychiatric:        Mood and Affect: Mood and affect normal.        Speech: Speech normal.        Behavior: Behavior normal.     ED Results / Procedures / Treatments   Labs (all labs ordered are listed, but only abnormal results are displayed) Labs Reviewed - No data to display  EKG None  Radiology CT Hip Right Wo Contrast  Result Date: 03/01/2020 CLINICAL DATA:  Hip pain.  Concern for fracture. EXAM: CT OF THE RIGHT HIP WITHOUT CONTRAST TECHNIQUE: Multidetector CT imaging of the right hip was performed according to the standard protocol. Multiplanar CT image reconstructions were also generated. COMPARISON:  X-ray from same day.  CT hip dated February 19, 2013. FINDINGS: Bones/Joint/Cartilage There is an old healed fracture of the right inferior pubic ramus. There is no evidence for an acute displaced fracture involving the right hip. Mild degenerative changes are noted of the right hip. There are bilateral sacral insufficiency fractures. There are degenerative changes of the lower lumbar spine. Ligaments Suboptimally assessed by CT. Muscles and Tendons There is no intramuscular hematoma. No large right hip joint effusion. Soft tissues Vascular calcifications are noted of the visualized  vascular structures. There is a large amount of stool in the visualized portions of the colon. There is sigmoid diverticulosis without CT evidence for diverticulitis. The patient appears to be status post prior appendectomy. The visualized portions of the urinary bladder are unremarkable. IMPRESSION: 1. No acute displaced fracture or dislocation involving the right hip. If there is high clinical suspicion for an occult fracture, consider further evaluation with MRI. 2. There are bilateral sacral insufficiency fractures. 3. Sigmoid diverticulosis without CT evidence for diverticulitis. 4.  Aortic Atherosclerosis (ICD10-I70.0). Electronically Signed   By: Constance Holster M.D.   On: 03/01/2020 20:45   DG Hip Unilat W or Wo Pelvis 2-3 Views Right  Result Date: 03/01/2020 CLINICAL DATA:  [place], right hip pain, inability to bear weight EXAM: DG HIP (WITH OR WITHOUT PELVIS) 2-3V RIGHT COMPARISON:  07/05/2013 FINDINGS: Frontal view of the pelvis as well as frontal and frogleg lateral views of the right hip are obtained. There is a prior healed right inferior pubic ramus fracture. No acute bony abnormalities. The hips are well aligned. Mild symmetrical bilateral hip osteoarthritis. Remainder of the bony pelvis is unremarkable. Soft tissues are normal. IMPRESSION: 1. No acute displaced fracture. 2. Symmetrical hip osteoarthritis. Electronically Signed   By: Randa Ngo M.D.   On: 03/01/2020 19:48    Procedures Procedures (including critical care time)  Medications Ordered in ED Medications  diazepam (VALIUM) tablet 5 mg (5 mg Oral Given 03/01/20 2228)  acetaminophen (TYLENOL) tablet 1,000 mg (1,000 mg Oral Given 03/01/20 2228)  metoprolol tartrate (LOPRESSOR) tablet 50 mg (50 mg Oral Given 03/01/20 2228)    ED Course  I have reviewed the triage vital signs and the nursing  notes.  Pertinent labs & imaging results that were available during my care of the patient were reviewed by me and considered in my  medical decision making (see chart for details).  Clinical Course as of Mar 02 16  Sat Mar 01, 2020  1952 Tried to walk patient. She denies any pain in the hip, however, as soon as she put weight on the right leg, she yelled out in pain. She was unable to put any weight on this leg due to pain in the right hip.   [SJ]    Clinical Course User Index [SJ] Berle Fitz, Helane Gunther, PA-C   MDM Rules/Calculators/A&P                          Patient presents for evaluation following a mechanical fall that occurred this morning. No evidence of neurovascular compromise. I personally reviewed and interpreted the patient's imaging studies. Hip x-ray and CT without acute abnormality. However, patient continues to have a great amount of pain with attempting to put weight on the right leg.  Findings and plan of care discussed with Varney Biles, MD.   End of shift patient care handoff report given to [facility], PA-C. Plan: Awaiting MRI of the right hip.   Vitals:   03/01/20 2125 03/01/20 2150 03/01/20 2228 03/02/20 0018  BP:  (!) 147/61 (!) 147/61 (!) 121/48  Pulse:  73 74 67  Resp:  16  18  Temp:  (!) 97.5 F (36.4 C)    TempSrc:  Oral    SpO2: 96% 95%  94%     Final Clinical Impression(s) / ED Diagnoses Final diagnoses:  None    Rx / DC Orders ED Discharge Orders    None       Layla Maw 03/02/20 Lawanda Cousins, MD 03/02/20 2119

## 2020-03-01 NOTE — ED Notes (Signed)
Tylenol for pain

## 2020-03-01 NOTE — ED Provider Notes (Signed)
Care assumed from [place], [place], at shift change, please see their notes for full documentation of patient's complaint/HPI. Briefly, pt here with complaint of R hip pain s/2 mechanical fall. Results so far show negative xray and CT. Pt was ambulated and unable to bare any weight onto leg per prior EDPA. Awaiting MRI. Plan is to dispo home if negative, pt will need to be ambulated to ensure safety prior to dispo.   Physical Exam  BP (!) 147/61   Pulse 74   Temp (!) 97.5 F (36.4 C) (Oral)   Resp 16   SpO2 95%   Physical Exam  ED Course/Procedures   Clinical Course as of Mar 02 645  Sat Mar 01, 2020  1952 Tried to walk patient. She denies any pain in the hip, however, as soon as she put weight on the right leg, she yelled out in pain. She was unable to put any weight on this leg due to pain in the right hip.   [SJ]    Clinical Course User Index [SJ] Joy, Shawn C, PA-C    Procedures  MDM  MRI has been completed however has not been read at this time. Will sign out to oncoming EDPA Janeece Fitting who will dispo accordingly.       Eustaquio Maize, PA-C 03/02/20 [id number]    Veryl Speak, MD 03/02/20 [phone]

## 2020-03-01 NOTE — ED Triage Notes (Signed)
Pt had witnessed mechanical fall about 0700, witnessed by caregiver who assisted patient. Pt has had limited motion since, in pain per caregiver but denies pain, pt received tylenol. Fell on R side, no obvious deformities, able to stand. At mental baseline.

## 2020-03-01 NOTE — ED Notes (Addendum)
Pt. C/o L side pain under breast radiating down L arm. [facility] preformed and documented. MD notified. Nurse will continue to monitor patient.

## 2020-03-02 ENCOUNTER — Emergency Department (HOSPITAL_COMMUNITY): Payer: Medicare Other

## 2020-03-02 DIAGNOSIS — S79911A Unspecified injury of right hip, initial encounter: Secondary | ICD-10-CM | POA: Diagnosis not present

## 2020-03-02 LAB — SARS CORONAVIRUS 2 BY RT PCR (HOSPITAL ORDER, PERFORMED IN ~~LOC~~ HOSPITAL LAB): SARS Coronavirus 2: NEGATIVE

## 2020-03-02 MED ORDER — METOPROLOL TARTRATE 25 MG PO TABS
25.0000 mg | ORAL_TABLET | Freq: Two times a day (BID) | ORAL | Status: DC
  Administered 2020-03-02 – 2020-03-04 (×4): 25 mg via ORAL
  Filled 2020-03-02 (×4): qty 1

## 2020-03-02 MED ORDER — MIRABEGRON ER 50 MG PO TB24
50.0000 mg | ORAL_TABLET | Freq: Every day | ORAL | Status: DC
  Administered 2020-03-03 – 2020-03-04 (×2): 50 mg via ORAL
  Filled 2020-03-02 (×3): qty 1

## 2020-03-02 MED ORDER — ESTROGENS CONJUGATED 0.3 MG PO TABS
0.3000 mg | ORAL_TABLET | Freq: Every day | ORAL | Status: DC
  Administered 2020-03-03 – 2020-03-04 (×2): 0.3 mg via ORAL
  Filled 2020-03-02 (×3): qty 1

## 2020-03-02 MED ORDER — BUPROPION HCL ER (XL) 150 MG PO TB24
300.0000 mg | ORAL_TABLET | Freq: Every day | ORAL | Status: DC
  Administered 2020-03-03 – 2020-03-04 (×2): 300 mg via ORAL
  Filled 2020-03-02 (×2): qty 2

## 2020-03-02 MED ORDER — ACETAMINOPHEN 500 MG PO TABS
1000.0000 mg | ORAL_TABLET | Freq: Once | ORAL | Status: AC
  Administered 2020-03-02: 1000 mg via ORAL
  Filled 2020-03-02: qty 2

## 2020-03-02 NOTE — ED Notes (Signed)
Pt placed on bedpan

## 2020-03-02 NOTE — ED Notes (Signed)
Pt's dinner ordered °

## 2020-03-02 NOTE — ED Notes (Addendum)
Copy of POA paperwork sent to Medical Records - [place].

## 2020-03-02 NOTE — Evaluation (Addendum)
Physical Therapy Evaluation Patient Details Name: [name] MRN: [id number] DOB: 1929/11/18 Today's Date: 03/02/2020   History of Present Illness  86 y.o. female, with a history of anxiety, back pain, dementia, HTN, hyperlipidemia, presenting to the ED 03/01/20 with concern for injury to the right hip that occurred around 7 AM this morning when pt fell to floor. MRI rt hip Moderate edema noted in the gluteus medius and minimus muscles and in the iliopsoas muscle consistent with acute muscle tears.   Clinical Impression   Contacted by Education officer, museum that per [employer], pt no longer has 24/7 caregivers. Due to decr cognition and incr fall risk, pt needs 24/7 care.   Pt admitted with above diagnosis. She can continue to benefit from PT to address rt hip pain, gait training, balance and safe use of DME.  Pt currently with functional limitations due to the deficits listed below (see PT Problem List).     Follow Up Recommendations SNF;Supervision/Assistance - 24 hour (pt no longer has 24/7 care at home)    Equipment Recommendations  Other (comment) (to be determined at [facility])    Recommendations for Other Services       Precautions / Restrictions Precautions Precautions: Fall Precaution Comments: h/o dementia Restrictions Weight Bearing Restrictions: No      Mobility  Bed Mobility Overal bed mobility: Needs Assistance Bed Mobility: Rolling;Sidelying to Sit;Sit to Supine Rolling: Independent Sidelying to sit: Supervision   Sit to supine: Supervision   General bed mobility comments: incr time/effort but pt wants to do it herself  Transfers Overall transfer level: Needs assistance Equipment used: Rolling walker (2 wheeled) Transfers: Sit to/from Stand Sit to Stand: Min guard         General transfer comment: max cues for safe use of RW; ultimately had to stabilize walker as pt wanted to pull up on RW  Ambulation/Gait Ambulation/Gait assistance: Min guard Gait  Distance (Feet): 5 Feet (2) Assistive device: Rolling walker (2 wheeled) Gait Pattern/deviations: Step-to pattern;Decreased stride length Gait velocity: decr   General Gait Details: vc for sequencing to lead with RLE; educated caregiver on use of RW (pt normally uses a rollator)   Stairs Stairs:  (Simulated--able to bear weightfully on RLE using UEs (SLS))          Wheelchair Mobility    Modified Rankin (Stroke Patients Only)       Balance Overall balance assessment: Needs assistance Sitting-balance support: No upper extremity supported;Feet supported Sitting balance-Leahy Scale: Good Sitting balance - Comments: bending forward to don underwear herself   Standing balance support: Bilateral upper extremity supported Standing balance-Leahy Scale: Poor Standing balance comment: requires bil UE support due to RLE pain                             Pertinent Vitals/Pain Pain Assessment: Faces Faces Pain Scale: Hurts a little bit Pain Location: rt hip  Pain Descriptors / Indicators: Discomfort Pain Intervention(s): Limited activity within patient's tolerance;Monitored during session;Repositioned    Home Living Family/patient expects to be discharged to:: Skilled nursing facility Living Arrangements: Non-relatives/Friends Available Help at Discharge: Personal care attendant (recently unable to get 24/7 caregivers per POA) Type of Home: House Home Access: Stairs to enter Entrance Stairs-Rails: Left Entrance Stairs-Number of Steps: 2 Home Layout: Two level;Able to live on main level with bedroom/bathroom Home Equipment: Walker - 4 wheels;Shower seat;Hand held shower head;Wheelchair - manual      Prior Function Level  of Independence: Needs assistance   Gait / Transfers Assistance Needed: per caregiver, able to ambulate with supervision with rollator (even at night to get to bathroom); always has 2 person assist on steps for safety  ADL's / Homemaking Assistance  Needed: per caregiver, she does bathing and dressing with close supervision        Hand Dominance   Dominant Hand: Right    Extremity/Trunk Assessment   Upper Extremity Assessment Upper Extremity Assessment: Overall WFL for tasks assessed    Lower Extremity Assessment Lower Extremity Assessment: RLE deficits/detail RLE Deficits / Details: pt denies pain at rest or sitting; has full ROM; able to bear weight with use of RW; able to lift her leg back up onto bed without pain    Cervical / Trunk Assessment Cervical / Trunk Assessment: Normal  Communication   Communication: No difficulties  Cognition Arousal/Alertness: Awake/alert Behavior During Therapy: WFL for tasks assessed/performed Overall Cognitive Status: History of cognitive impairments - at baseline                                 General Comments: caregiver present; pt with dementia       General Comments      Exercises     Assessment/Plan    PT Assessment Patient needs continued PT services  PT Problem List Decreased activity tolerance;Decreased balance;Decreased mobility;Decreased cognition;Decreased knowledge of use of DME;Decreased safety awareness;Obesity;Pain       PT Treatment Interventions DME instruction;Gait training;Functional mobility training;Therapeutic activities;Therapeutic exercise;Cognitive remediation;Patient/family education    PT Goals (Current goals can be found in the Care Plan section)  Acute Rehab PT Goals Patient Stated Goal: go home PT Goal Formulation: Patient unable to participate in goal setting Time For Goal Achievement: 03/16/20 Potential to Achieve Goals: Fair    Frequency Min 2X/week   Barriers to discharge Decreased caregiver support later notified by SW that pt does NOT have 24/7 caregivers    Co-evaluation               AM-PAC PT "6 Clicks" Mobility  Outcome Measure Help needed turning from your back to your side while in a flat bed without  using bedrails?: None Help needed moving from lying on your back to sitting on the side of a flat bed without using bedrails?: None Help needed moving to and from a bed to a chair (including a wheelchair)?: A Little Help needed standing up from a chair using your arms (e.g., wheelchair or bedside chair)?: A Little Help needed to walk in hospital room?: A Little Help needed climbing 3-5 steps with a railing? : A Little 6 Click Score: 20    End of Session Equipment Utilized During Treatment: Gait belt Activity Tolerance: Patient tolerated treatment well Patient left: in bed;with call bell/phone within reach;with family/visitor present Nurse Communication: Mobility status;Other (comment) (discharge plan) PT Visit Diagnosis: Difficulty in walking, not elsewhere classified (R26.2);Pain Pain - Right/Left: Right Pain - part of body: Hip    Time: 1019-1100 PT Time Calculation (min) (ACUTE ONLY): 41 min   Charges:   PT Evaluation $PT Eval Low Complexity: 1 Low PT Treatments $Gait Training: 8-22 mins         Arby Barrette, PT Pager [phone]   Rexanne Mano 03/02/2020, 12:21 PM

## 2020-03-02 NOTE — ED Notes (Addendum)
Pt. Transferred to chair.

## 2020-03-02 NOTE — ED Notes (Signed)
Pt woke - stood at bedside per daughter - then sat back down on bed - Advising need to void and have BM - Bedpan being obtained for pt.

## 2020-03-02 NOTE — Progress Notes (Signed)
CSW spoke with RN regarding disposition for patient regarding PT. Per PT they were deferring for MRI, however MRI order has been discontinued. CSW discussed with RNCM PT's discussion of [facility] in their note to see if patient would be eligible. CSW paged on-call PT for follow-up for PT eval for determination of SNF versus HH or outpatient PT.    03/02/20 0848  TOC ED Mini Assessment  TOC Time spent with patient (minutes): 15  PING Used in TOC Assessment No  Interventions which prevented an admission or readmission Other (must enter comment) (Pending PT/OT)  What brought you to the Emergency Department?  Generalized weakness  Barriers to Discharge Continued Medical Work up  [employer] PT/OT consult, [facility] referral

## 2020-03-02 NOTE — ED Notes (Signed)
POA and Caregiver have not returned. Pt noted to be yelling out for staff intermittently - stating she does not know what to do. Pt assisted on bedpan as requested.

## 2020-03-02 NOTE — ED Provider Notes (Signed)
  Physical Exam  BP (!) 146/50   Pulse 72   Temp (!) 97.5 F (36.4 C) (Oral)   Resp (!) 21   SpO2 95%   Physical Exam  ED Course/Procedures   Clinical Course as of Mar 02 1149  Sat Mar 01, 2020  1952 Tried to walk patient. She denies any pain in the hip, however, as soon as she put weight on the right leg, she yelled out in pain. She was unable to put any weight on this leg due to pain in the right hip.   [SJ]    Clinical Course User Index [SJ] Joy, Shawn C, PA-C    Procedures  MDM  Patient care assumed from Margaux PA at shift change, please see her note for a full HPI.  Briefly, patient here unable to bear weight, has tried multiple attempts at ambulating however has denied to do so because of pain.  She is currently pending an MRI to rule out any acute fracture.  Has had normal x-rays along with CTs.  I have also placed a PT consultation pending MRI.  MR Hip showed: 1. No acute hip fracture or AVN.  2. Mild bilateral hip joint degenerative changes but no large joint  effusion or other arthropathic findings.  3. Moderate edema like signal changes in the gluteus medius and  minimus muscles and in the iliopsoas muscle consistent with acute  muscle tears.       Review through chart, I see that the PT consult has been discontinue, I will recontact PT at this time as MRI was consistent with acute muscle tears.  Patient has also had social work involved in order to determine appropriate disposition.  10:06 AM results of MRI were discussed with patient along with caregiver at the bedside. They would not like to have SNF placement at this time. Reports that patient does have care around the clock while at home. Patient still pending a physical therapy evaluation to see if she is safely able to go home. Family requests that I contact Earlie Lou, call placed POA and was informed of results.   10:09 AM Vicie Mutters who recommended sending her to a SNIF as patient has become  more combative and they are unable to keep her in the home. Carolynn Serve will come into hospital, in order to discussed this with Education officer, museum.   10:23 AM Spoke to Mickel Baas PT who reported patient's PT consultation had been canceled as MRI results had not been reported.  Second physical therapist on her way to patient's room in order to provide PT eval and assessment.  11:49 AM patient has been evaluated by PT, her home meds have been ordered at this time.  We will place consultation for social work in order to obtain placement, patient is adamant that she does not want her going to facility however patient does have a history of vascular dementia.     Portions of this note were generated with Lobbyist. Dictation errors may occur despite best attempts at proofreading.       Janeece Fitting, PA-C 03/03/20 [place], MD 03/04/20 1500

## 2020-03-02 NOTE — Progress Notes (Signed)
PT Cancellation Note  Patient Details Name: [name] MRN: [id number] DOB: [birth year]   Cancelled Treatment:    Reason Eval/Treat Not Completed: PT eval received, chart reviewed. Noted pt had difficulty ambulating with PA-C and MRI results are currently pending. Per chart review, plan is to d/c home if MRI is negative. Anticipate pt will need some sort of follow-up therapy at d/c if she is having difficulty bearing weight. Recommend reach out to [facility] to see if [facility] would be appropriate vs SNF pending official PT eval.    Thelma Comp 03/02/2020, 7:22 AM  Rolinda Roan, PT, DPT Acute Rehabilitation Services Pager: [phone] Office: [phone]

## 2020-03-02 NOTE — TOC Progression Note (Signed)
Transition of Care [facility]) - Progression Note    Patient Details  Name: [name] MRN: [id number] Date of Birth: Jun 12, 1930  [facility]) CM/SW [place], [place] Phone Number: 03/02/2020, 2:50 PM  Please do not inform patient of facility placement until tomorrow A.M. for convalescent SNF placement per request of HCPOA   Clinical Narrative: CSW coordinated intervention with RNCM, unit RN, PA, and attending physician. Patient presented from home after having a fall with no noted fractures. Per patient's HCPOA they have had patient in her home the past eight months with 24/7 custodial care out of pocket to take care of patient but due to dementia related behaviors they have recently struggled to keep caregivers. CSW notes per PT recommendation patient required supervision and would benefit from SNF treatment. CSW discussed with HCPOA the barriers to seeking SNF and noted per [facility] patient has supportive finances and stipend and has been able to pay out of pocket for custodial care (12K) a month and remains in a financially healthy position. CSW discussed if placement is not identified she would need to continue seeking a dementia care unit and return home with 24/hr supervision. CSW began referral process out to providers in the area and noted two bed offers. CSW spoke with HCPOA and proceeding with bed offer at [facility]. CSW spoke with admissions coordinator who would have an open bed on a locked dementia unit tomorrow and that if insurance could not be verified HCPOA may have to pay out of pocket. CSW confirmed this will not be an issue and confirmed patient will transfer tomorrow in the morning to [facility]. Per HCPOA she is worried patient will guilt her for placing her into a SNF. HCPOA requested patient not to be informed of placement until tomorrow morning and CSW believes this is a reasonable request. CSW discussed with RNCM, RN, HCPOA, and notes agreement with  discharge plan.    Expected Discharge Plan: Skilled Nursing Facility Barriers to Discharge: Continued Medical Work up  Expected Discharge Plan and Services Expected Discharge Plan: [place]                                               Social Determinants of Health (SDOH) Interventions    Readmission Risk Interventions No flowsheet data found.

## 2020-03-02 NOTE — ED Notes (Signed)
Pt's caregiver, Eduard Clos, at bedside.

## 2020-03-02 NOTE — ED Notes (Signed)
Social Work at bedside 

## 2020-03-02 NOTE — NC FL2 (Signed)
[place] LEVEL OF CARE SCREENING TOOL     IDENTIFICATION  Patient Name: [name] Birthdate: 29-Mar-1930 Sex: female Admission Date (Current Location): 03/01/2020  [facility] and [place] Number:  Herbalist and Address:  The . [facility], [place] [address], [place], [place] [address]      Provider Number: [id number]  Attending Physician Name and Address:  Default, Provider, MD  Relative Name and Phone Number:       Current Level of Care: Other (Comment) (emergency department) Recommended Level of Care: [place] Prior Approval Number:    Date Approved/Denied:   PASRR Number: [id number]  Discharge Plan: Other (Comment) (long term memory care)    Current Diagnoses: Patient Active Problem List   Diagnosis Date Noted  . Syncope 07/17/2019  . Dementia ([place]) 2020  . Cognitive deficit due to recent stroke 03/08/2017  . Alteration of sensation as late effect of stroke 03/08/2017  . Gait disturbance, post-stroke 03/08/2017  . Acute arterial ischemic stroke, vertebrobasilar, thalamic, left ([place]) 01/10/2017  . Neurologic gait disorder   . Depression   . Benign essential HTN   . History of breast cancer   . Stroke (cerebrum) ([place]) 01/08/2017  . TIA (transient ischemic attack) 12/16/2016  . Dysarthria 12/16/2016  . Leg weakness 12/16/2016  . EKG abnormalities 12/16/2016  . Chronic pain 12/16/2016  . Elevated blood pressure 12/22/2014  . Internal hemorrhoids with bleeding and prolapse 11/04/2014  . Lactose intolerance 11/04/2014  . Hemorrhoid 09/11/2014  . Rectal bleeding 09/11/2014  . IBS (irritable bowel syndrome) 09/25/2013  . Hyperparathyroidism, primary ([place]) 05/26/2011  . Osteoporosis 05/09/2009  . POLYCYTHEMIA 04/23/2009  . IMPAIRED FASTING GLUCOSE 11/14/2008  . BREAST CANCER, HX OF 12/06/2007  . Anxiety state 10/13/2007  . Essential hypertension 10/13/2007  . INTERSTITIAL CYSTITIS 09/06/2007  . HLD  (hyperlipidemia) 03/01/2007  . Backache 03/01/2007    Orientation RESPIRATION BLADDER Height & Weight     Self, Situation, Place  Normal Continent (needs assistance) Weight:   Height:     BEHAVIORAL SYMPTOMS/MOOD NEUROLOGICAL BOWEL NUTRITION STATUS      Continent (needs assistance) Diet (Regular, fluid thin)  AMBULATORY STATUS COMMUNICATION OF NEEDS Skin   Limited Assist Verbally Bruising                       Personal Care Assistance Level of Assistance  Bathing, Dressing, Feeding Bathing Assistance: Limited assistance Feeding assistance: Independent Dressing Assistance: Limited assistance     Functional Limitations Info             SPECIAL CARE FACTORS FREQUENCY  PT (By licensed PT), OT (By licensed OT)     PT Frequency: 5x weekly OT Frequency: 5x weekly            Contractures Contractures Info: Not present    Additional Factors Info  Code Status, Allergies Code Status Info: Prior- DNR Allergies Info: see attached medical information           Current Medications (03/02/2020):  This is the current hospital active medication list Current Facility-Administered Medications  Medication Dose Route Frequency Provider Last Rate Last Admin  . buPROPion (WELLBUTRIN XL) 24 hr tablet 300 mg  300 mg Oral Daily Soto, Johana, PA-C      . estrogens (conjugated) (PREMARIN) tablet 0.3 mg  0.3 mg Oral Daily Soto, Johana, PA-C      . metoprolol tartrate (LOPRESSOR) tablet 25 mg  25 mg Oral BID Soto,  Johana, PA-C      . mirabegron ER (MYRBETRIQ) tablet 50 mg  50 mg Oral Daily Janeece Fitting, PA-C       Current Outpatient Medications  Medication Sig Dispense Refill  . carisoprodol (SOMA) 250 MG tablet Take 250 mg by mouth daily.     . carisoprodol (SOMA) 350 MG tablet Take 350 mg by mouth at bedtime.    . diazepam (VALIUM) 5 MG tablet Take 1/2 tablet in AM (2.5mg ) and 1 tablet at night (5mg ). (Patient taking differently: Take 2.8-5 mg by mouth See admin instructions.  Take 1/2 tablet every morning and take 1 tablet at night) 40 tablet 0  . EPINEPHrine 0.3 mg/0.3 mL IJ SOAJ injection Inject 0.3 mLs (0.3 mg total) into the muscle as needed for anaphylaxis. 1 each 0  . metoprolol tartrate (LOPRESSOR) 25 MG tablet Take 25 mg by mouth 2 (two) times daily.    . mirabegron ER (MYRBETRIQ) 50 MG TB24 tablet Take 50 mg by mouth daily.    Marland Kitchen PREMARIN 0.3 MG tablet Take 0.3 mg by mouth daily.    . WELLBUTRIN XL 300 MG 24 hr tablet Take 300 mg by mouth daily.     . celecoxib (CELEBREX) 200 MG capsule Take 1 capsule (200 mg total) by mouth daily as needed (arthritis pain). (Patient not taking: Reported on 03/02/2020) 30 capsule 0  . Cholecalciferol (VITAMIN D3) 1000 units CAPS Take 1 capsule (1,000 Units total) by mouth daily. (Patient not taking: Reported on 03/02/2020) 60 capsule 0  . fluticasone (FLONASE) 50 MCG/ACT nasal spray Place 2 sprays into both nostrils daily. (Patient not taking: Reported on 03/02/2020) 16 g 2  . oxyCODONE-acetaminophen (PERCOCET/ROXICET) 5-325 MG tablet Take 1 tablet by mouth every 8 (eight) hours as needed for severe pain. (Patient not taking: Reported on 03/02/2020) 12 tablet 0     Discharge Medications: Please see discharge summary for a list of discharge medications.  Relevant Imaging Results:  Relevant Lab Results:   Additional Information SSN: [place], [place]

## 2020-03-02 NOTE — ED Notes (Signed)
Pure Wick applied - pt stated "this will be much better because having to get up and down is wearing me out".

## 2020-03-02 NOTE — TOC Initial Note (Signed)
Transition of Care [facility]) - Initial/Assessment Note    Patient Details  Name: [name] MRN: [id number] Date of Birth: [birth year]  Transition of Care [place] [facility]) CM/SW Contact:    Verdell Carmine, RN Phone Number: 03/02/2020, 12:17 PM  Clinical Narrative:                 Interviewed the patient with CSW. Patient congenial but guarded. Would like home care services, she states she "Mises them"  But does not like to be told what to do. She does not remember her POA tinas number.  Spoke to [place]. The patient has been aggressive at home, hurting caregivers routinely. Losing caregivers, falling frequently, has had "13 falls in the last 6 months" Her primary care has told the patient that she will need to go into nursing facility due to inability to care for herself and caregivers at home unable to keep her from falling.  Otila Kluver is POA, no family there are two nephews, but the patient does not get along with them. Spoke to [employer], with SW all are in congruence with the fact that the patient does not have capacity with her advancing dementia to make decisions and Otila Kluver, will bring POA, and we will be looking for placement for [name].   Expected Discharge Plan: Skilled Nursing Facility Barriers to Discharge: Continued Medical Work up   Patient Goals and CMS Choice Patient states their goals for this hospitalization and ongoing recovery are:: go home with home health, however POA states she cannot go back home      Expected Discharge Plan and Services Expected Discharge Plan: [place]                                              Prior Living Arrangements/Services   Lives with:: Self (round the clock care) Patient language and need for interpreter reviewed:: Yes        Need for Family Participation in Patient Care: Yes (Comment) Care giver support system in place?: Yes (comment)   Criminal Activity/Legal Involvement Pertinent to Current  Situation/Hospitalization: No - Comment as needed  Activities of Daily Living      Permission Sought/Granted                  Emotional Assessment Appearance:: Appears younger than stated age Attitude/Demeanor/Rapport: Guarded Affect (typically observed): Stoic Orientation: : Fluctuating Orientation (Suspected and/or reported [place]) (advancing vascular dementia)   Psych Involvement: No (comment)  Admission diagnosis:  fall poss hip injury Patient Active Problem List   Diagnosis Date Noted  . Syncope 07/17/2019  . Dementia ([place]) 2020  . Cognitive deficit due to recent stroke 03/08/2017  . Alteration of sensation as late effect of stroke 03/08/2017  . Gait disturbance, post-stroke 03/08/2017  . Acute arterial ischemic stroke, vertebrobasilar, thalamic, left ([place]) 01/10/2017  . Neurologic gait disorder   . Depression   . Benign essential HTN   . History of breast cancer   . Stroke (cerebrum) ([place]) 01/08/2017  . TIA (transient ischemic attack) 12/16/2016  . Dysarthria 12/16/2016  . Leg weakness 12/16/2016  . EKG abnormalities 12/16/2016  . Chronic pain 12/16/2016  . Elevated blood pressure 12/22/2014  . Internal hemorrhoids with bleeding and prolapse 11/04/2014  . Lactose intolerance 11/04/2014  . Hemorrhoid 09/11/2014  . Rectal bleeding 09/11/2014  . IBS (irritable bowel syndrome) 09/25/2013  .  Hyperparathyroidism, primary ([place]) 05/26/2011  . Osteoporosis 05/09/2009  . POLYCYTHEMIA 04/23/2009  . IMPAIRED FASTING GLUCOSE 11/14/2008  . BREAST CANCER, HX OF 12/06/2007  . Anxiety state 10/13/2007  . Essential hypertension 10/13/2007  . INTERSTITIAL CYSTITIS 09/06/2007  . HLD (hyperlipidemia) 03/01/2007  . Backache 03/01/2007   PCP:  Patient, No Pcp Per Pharmacy:   [id number] - [place], [place] [address] [place] [address] Phone: [phone] Fax: [phone]     Social Determinants of Health (SDOH) Interventions     Readmission Risk Interventions No flowsheet data found.

## 2020-03-02 NOTE — ED Notes (Signed)
Tena, POA, advised she will be leaving shortly and pt's night caregiver will be coming to be w/pt.

## 2020-03-02 NOTE — ED Notes (Signed)
Pt's lunch ordered 

## 2020-03-02 NOTE — ED Notes (Signed)
Pt's dinner arrived. °

## 2020-03-03 DIAGNOSIS — S79911A Unspecified injury of right hip, initial encounter: Secondary | ICD-10-CM | POA: Diagnosis not present

## 2020-03-03 MED ORDER — LORAZEPAM 2 MG/ML IJ SOLN
1.0000 mg | Freq: Once | INTRAMUSCULAR | Status: AC
  Administered 2020-03-03: 1 mg via INTRAMUSCULAR
  Filled 2020-03-03: qty 1

## 2020-03-03 MED ORDER — DIAZEPAM 2 MG PO TABS
2.8000 mg | ORAL_TABLET | ORAL | Status: DC
  Filled 2020-03-03: qty 2

## 2020-03-03 MED ORDER — ENSURE ENLIVE PO LIQD
237.0000 mL | Freq: Two times a day (BID) | ORAL | Status: DC
  Administered 2020-03-03 – 2020-03-04 (×2): 237 mL via ORAL
  Filled 2020-03-03 (×2): qty 237

## 2020-03-03 MED ORDER — POLYETHYLENE GLYCOL 3350 17 G PO PACK
17.0000 g | PACK | Freq: Every day | ORAL | Status: DC
  Administered 2020-03-03 – 2020-03-04 (×2): 17 g via ORAL
  Filled 2020-03-03 (×2): qty 1

## 2020-03-03 NOTE — ED Notes (Signed)
Pt agitated and shouting for police man

## 2020-03-03 NOTE — ED Notes (Signed)
Pt refusing to eat breakfast. Pt offered something else, but refused. Informed Elizabeth - RN.

## 2020-03-03 NOTE — ED Notes (Signed)
Pt now requesting to leave- legs slung over bed. RN and sitter at bedside provided redirections. She is shouting "we cant make her do anything she doesn't want to do. I can have a BM if I just get home! Let me out of here!"  Staff remain at bedside to help redirect and help patient keep calm.

## 2020-03-03 NOTE — Progress Notes (Signed)
CSW spoke with Freda Munro at [facility] who states the facility will receive this patient today. CSW informed Freda Munro that this patient has received her COVID vaccines. Freda Munro will provide CSW with room assignment information once it becomes available.  Madilyn Fireman, MSW, LCSW-A Transitions of Care  Clinical Social Worker  [facility] Emergency Departments  Medical ICU [phone]

## 2020-03-03 NOTE — ED Notes (Signed)
Amy the care giver has Pt 's rings

## 2020-03-03 NOTE — ED Notes (Signed)
Per sitter Pt ate approx. 1/2 dinner . Pt now seems more confused and now is at end of bed with legs hanging off end of bed.

## 2020-03-03 NOTE — Discharge Planning (Signed)
Licensed Clinical Social Worker is seeking post-discharge placement for this patient at the following level of care: skilled nursing facility    

## 2020-03-03 NOTE — ED Notes (Signed)
dtr was able to help give her her meds- pt thinks staff is trying to poison her.

## 2020-03-03 NOTE — ED Notes (Signed)
Ordered pt's dinner 

## 2020-03-03 NOTE — ED Notes (Signed)
Pt alert and talking with sitter.

## 2020-03-03 NOTE — ED Notes (Signed)
Daughter at bed side and took Pt's rings . Daughter told Pt that the Dr. wanted to take another x-ray. Pt gave Daughter her rings. Daughter had blue hair.

## 2020-03-03 NOTE — ED Provider Notes (Signed)
Emergency Medicine Observation Re-evaluation Note  [name] is a 86 y.o. female, seen on rounds today.  Pt initially presented to the ED for complaints of Fall Currently, the patient is resting.  Physical Exam  BP (!) 175/71 (BP Location: Left Arm)    Pulse 79    Temp 97.6 F (36.4 C) (Oral)    Resp 17    SpO2 93%  Physical Exam HENT:     Head: Normocephalic.  Eyes:     Conjunctiva/sclera: Conjunctivae normal.  Pulmonary:     Effort: Pulmonary effort is normal.  Abdominal:     General: There is no distension.  Neurological:     Mental Status: She is alert.  Psychiatric:        Behavior: Behavior normal.     ED Course / MDM  EKG:  Clinical Course as of Mar 03 1245  Sat Mar 01, 2020  1952 Tried to walk patient. She denies any pain in the hip, however, as soon as she put weight on the right leg, she yelled out in pain. She was unable to put any weight on this leg due to pain in the right hip.   [SJ]    Clinical Course User Index [SJ] Joy, Shawn C, PA-C   I have reviewed the labs performed to date as well as medications administered while in observation.  Recent changes in the last 24 hours include n/a. Plan  Current plan is for SNF placement. Patient is not under full IVC at this time.   Margarita Mail, PA-C 03/03/20 1248    Milton Ferguson, MD 03/03/20 1552

## 2020-03-03 NOTE — Social Work (Deleted)
CSW and [facility] RN met with Pt and family at bedside. Pt reports having experienced personal trauma and extremely poor care at [facility] in [place] and as such is unwilling to consider SNF placement in current location. CSW expressed understanding. TOC team recommended [place] as well as the family hiring in-home care to supplement such services. Care team will coordinate connect Pt with [place] as well as DME needs. CSW will continue to follow for d/c needs.

## 2020-03-03 NOTE — ED Notes (Signed)
Pt attempting to climb out of bed

## 2020-03-03 NOTE — ED Notes (Addendum)
Pt attempted to use bedpan and not successful. States she is constipated and wants prune juice. Miralax given.

## 2020-03-03 NOTE — ED Notes (Signed)
Pt. Refusing vitals.

## 2020-03-04 DIAGNOSIS — S79911A Unspecified injury of right hip, initial encounter: Secondary | ICD-10-CM | POA: Diagnosis not present

## 2020-03-04 NOTE — ED Notes (Signed)
Lunch order place..... offered pt snack. Pt only wanted milk

## 2020-03-04 NOTE — ED Provider Notes (Addendum)
Emergency Medicine Observation Re-evaluation Note  [name] is a 86 y.o. female, seen on rounds today.  Pt initially presented to the ED for complaints of Fall Currently, the patient is currently eating while lying in bed. She reports she is "sick and tired of being here, bored and upset I cannot look at the windows". She is under the impression that she is home at this time.   Physical Exam  BP (!) 178/98 (BP Location: Left Arm)   Pulse 89   Temp 97.7 F (36.5 C) (Oral)   Resp 18   SpO2 94%  Physical Exam Vitals and nursing note reviewed.  HENT:     Head: Normocephalic and atraumatic.     Mouth/Throat:     Mouth: Mucous membranes are moist.  Cardiovascular:     Rate and Rhythm: Normal rate.  Pulmonary:     Effort: Pulmonary effort is normal.  Abdominal:     General: Abdomen is flat.  Musculoskeletal:     Cervical back: Normal range of motion and neck supple.  Skin:    General: Skin is warm and dry.  Neurological:     Mental Status: She is alert. Mental status is at baseline.      ED Course / MDM  EKG:  Clinical Course as of Mar 04 1118  Sat Mar 01, 2020  1952 Tried to walk patient. She denies any pain in the hip, however, as soon as she put weight on the right leg, she yelled out in pain. She was unable to put any weight on this leg due to pain in the right hip.   [SJ]    Clinical Course User Index [SJ] Joy, Shawn C, PA-C   I have reviewed the labs performed to date as well as medications administered while in observation.  Recent changes in the last 24 hours include episode of agitation this morning but redirected by nursing staff.  Plan  Current plan is for patient to go to [facility] today pending availability. Patient AVS printed for her and disposition for [place] [facility]. Patient is not under full IVC at this time.   Janeece Fitting, PA-C 03/04/20 1129    Janeece Fitting, PA-C 03/04/20 1149    Isla Pence, MD 03/04/20 1155

## 2020-03-04 NOTE — ED Notes (Signed)
[facility] has arrived to transport pt - ALL belongings - PT[facility].

## 2020-03-04 NOTE — Progress Notes (Signed)
CSW spoke with Freda Munro at [facility] who states this patient has been accepted into the facility and can be transferred there today.  The patient will go to room 101E. The patient will be transported via [place], RN to call once ready. The number for report is [phone].  Madilyn Fireman, MSW, LCSW-A Transitions of Care  Clinical Social Worker  [facility] Emergency Departments  Medical ICU [phone]

## 2020-03-05 ENCOUNTER — Emergency Department (HOSPITAL_COMMUNITY): Payer: Medicare Other

## 2020-03-05 ENCOUNTER — Other Ambulatory Visit: Payer: Self-pay

## 2020-03-05 ENCOUNTER — Emergency Department (HOSPITAL_COMMUNITY)
Admission: EM | Admit: 2020-03-05 | Discharge: 2020-03-06 | Disposition: A | Discharge status: Home / Self Care | Payer: Medicare Other | Attending: Emergency Medicine | Admitting: Emergency Medicine

## 2020-03-05 ENCOUNTER — Encounter (HOSPITAL_COMMUNITY): Payer: Self-pay | Admitting: Emergency Medicine

## 2020-03-05 DIAGNOSIS — R531 Weakness: Secondary | ICD-10-CM | POA: Insufficient documentation

## 2020-03-05 DIAGNOSIS — R000 Tachycardia, unspecified: Secondary | ICD-10-CM | POA: Insufficient documentation

## 2020-03-05 DIAGNOSIS — I10 Essential (primary) hypertension: Secondary | ICD-10-CM | POA: Diagnosis not present

## 2020-03-05 DIAGNOSIS — R200 Anesthesia of skin: Secondary | ICD-10-CM | POA: Diagnosis not present

## 2020-03-05 DIAGNOSIS — Z79899 Other long term (current) drug therapy: Secondary | ICD-10-CM | POA: Insufficient documentation

## 2020-03-05 DIAGNOSIS — Z87891 Personal history of nicotine dependence: Secondary | ICD-10-CM | POA: Insufficient documentation

## 2020-03-05 DIAGNOSIS — Z853 Personal history of malignant neoplasm of breast: Secondary | ICD-10-CM | POA: Diagnosis not present

## 2020-03-05 LAB — CBC WITH DIFFERENTIAL/PLATELET
Abs Immature Granulocytes: 0.04 10*3/uL (ref 0.00–0.07)
Basophils Absolute: 0.1 10*3/uL (ref 0.0–0.1)
Basophils Relative: 1 %
Eosinophils Absolute: 0.1 10*3/uL (ref 0.0–0.5)
Eosinophils Relative: 1 %
HCT: 49.7 % — ABNORMAL HIGH (ref 36.0–46.0)
Hemoglobin: 16.7 g/dL — ABNORMAL HIGH (ref 12.0–15.0)
Immature Granulocytes: 0 %
Lymphocytes Relative: 20 %
Lymphs Abs: 2.3 10*3/uL (ref 0.7–4.0)
MCH: 31.7 pg (ref 26.0–34.0)
MCHC: 33.6 g/dL (ref 30.0–36.0)
MCV: 94.3 fL (ref 80.0–100.0)
Monocytes Absolute: 0.7 10*3/uL (ref 0.1–1.0)
Monocytes Relative: 6 %
Neutro Abs: 8.4 10*3/uL — ABNORMAL HIGH (ref 1.7–7.7)
Neutrophils Relative %: 72 %
Platelets: 220 10*3/uL (ref 150–400)
RBC: 5.27 MIL/uL — ABNORMAL HIGH (ref 3.87–5.11)
RDW: 11.9 % (ref 11.5–15.5)
WBC: 11.6 10*3/uL — ABNORMAL HIGH (ref 4.0–10.5)
nRBC: 0 % (ref 0.0–0.2)

## 2020-03-05 LAB — COMPREHENSIVE METABOLIC PANEL
ALT: 47 U/L — ABNORMAL HIGH (ref 0–44)
AST: 27 U/L (ref 15–41)
Albumin: 4.1 g/dL (ref 3.5–5.0)
Alkaline Phosphatase: 85 U/L (ref 38–126)
Anion gap: 13 (ref 5–15)
BUN: 26 mg/dL — ABNORMAL HIGH (ref 8–23)
CO2: 27 mmol/L (ref 22–32)
Calcium: 9.7 mg/dL (ref 8.9–10.3)
Chloride: 100 mmol/L (ref 98–111)
Creatinine, Ser: 1.02 mg/dL — ABNORMAL HIGH (ref 0.44–1.00)
GFR calc Af Amer: 57 mL/min — ABNORMAL LOW (ref >60)
GFR calc non Af Amer: 49 mL/min — ABNORMAL LOW (ref >60)
Glucose, Bld: 144 mg/dL — ABNORMAL HIGH (ref 70–99)
Potassium: 4.3 mmol/L (ref 3.5–5.1)
Sodium: 140 mmol/L (ref 135–145)
Total Bilirubin: 0.8 mg/dL (ref 0.3–1.2)
Total Protein: 7.5 g/dL (ref 6.5–8.1)

## 2020-03-05 LAB — URINALYSIS, ROUTINE W REFLEX MICROSCOPIC
Bilirubin Urine: NEGATIVE
Glucose, UA: NEGATIVE mg/dL
Hgb urine dipstick: NEGATIVE
Ketones, ur: NEGATIVE mg/dL
Leukocytes,Ua: NEGATIVE
Nitrite: NEGATIVE
Protein, ur: NEGATIVE mg/dL
Specific Gravity, Urine: 1.021 (ref 1.005–1.030)
pH: 5 (ref 5.0–8.0)

## 2020-03-05 LAB — TROPONIN I (HIGH SENSITIVITY)
Troponin I (High Sensitivity): 12 ng/L (ref <18)
Troponin I (High Sensitivity): 11 ng/L (ref <18)

## 2020-03-05 MED ORDER — SODIUM CHLORIDE 0.9 % IV BOLUS
1000.0000 mL | Freq: Once | INTRAVENOUS | Status: AC
  Administered 2020-03-05: 1000 mL via INTRAVENOUS

## 2020-03-05 MED ORDER — DIAZEPAM 2 MG PO TABS
2.0000 mg | ORAL_TABLET | Freq: Once | ORAL | Status: AC
  Administered 2020-03-05: 2 mg via ORAL
  Filled 2020-03-05: qty 1

## 2020-03-05 MED ORDER — METOPROLOL TARTRATE 5 MG/5ML IV SOLN
5.0000 mg | Freq: Once | INTRAVENOUS | Status: AC
  Administered 2020-03-05: 5 mg via INTRAVENOUS
  Filled 2020-03-05: qty 5

## 2020-03-05 MED ORDER — METOPROLOL TARTRATE 25 MG PO TABS
25.0000 mg | ORAL_TABLET | Freq: Once | ORAL | Status: AC
  Administered 2020-03-05: 25 mg via ORAL
  Filled 2020-03-05: qty 1

## 2020-03-05 NOTE — ED Provider Notes (Signed)
Patient heart rate now is below 100 after taking her medications.  Currently 42.  Blood pressure still elevated but they can continue her meds at the nursing facility.  Urinalysis negative.  Troponins x2 - this was done because EKG showed some ST segment depression but it was unchanged from before.  As well patient's MRI without any acute findings.   Patient can be discharged back to nursing facility.   Fredia Sorrow, MD 03/05/20 1942

## 2020-03-05 NOTE — ED Notes (Signed)
Patient back from MRI.

## 2020-03-05 NOTE — ED Provider Notes (Signed)
[facility] DEPT Provider Note   CSN: [id number] Arrival date & time: 03/05/20  1740     History Chief Complaint  Patient presents with  . Numbness    [name] is a 86 y.o. female.  Patient complains of numbness in her right thigh.  Also some weakness.  She was seen about this right thigh couple times and has had a CT scan that was negative.  She had a fall which she has some paresthesias from.  Patient also has a history of dementia  The history is provided by the patient. No language interpreter was used.  Weakness Severity:  Moderate Onset quality:  Sudden Timing:  Intermittent Progression:  Worsening Chronicity:  New Context: not alcohol use   Relieved by:  Nothing Worsened by:  Nothing Ineffective treatments:  None tried Associated symptoms: no abdominal pain, no chest pain, no cough, no diarrhea, no frequency, no headaches and no seizures        Past Medical History:  Diagnosis Date  . Adenomatous colon polyp 04/2005  . Allergic    Anectine  . ANXIETY 10/13/2007  . BACK PAIN 03/01/2007  . BREAST CANCER, HX OF 12/06/2007  . Cancer [facility])    RIGHT mastectomy with node dissection  . Complication of anesthesia    family aggergy to annectine  . Compression fracture of L4 lumbar vertebra 02/2013  . CONSTIPATION 01/06/2009  . Dementia ([place]) 2020   Per POA  . DEPRESSION 10/13/2007  . Diverticular disease   . DIVERTICULOSIS, COLON 05/11/2005  . External hemorrhoids without mention of complication 04/05/4817  . FATIGUE 08/07/2008  . HEMORRHOIDS, INTERNAL 05/11/2005  . Hypercalcemia 04/23/2009  . HYPERLIPIDEMIA NEC/NOS 03/01/2007  . HYPERTENSION 10/13/2007  . Impaired fasting glucose 11/14/2008  . INTERSTITIAL CYSTITIS 09/06/2007  . Irritable bowel syndrome 01/06/2009  . LIVER MASS 05/15/2009  . OSTEOPOROSIS 05/09/2009  . PARESTHESIA 09/06/2007  . POLYCYTHEMIA 04/23/2009  . STENOSIS, SPINAL, UNSPC REGION 03/01/2007  . Thyroid disease      Patient Active Problem List   Diagnosis Date Noted  . Syncope 07/17/2019  . Dementia ([place]) 2020  . Cognitive deficit due to recent stroke 03/08/2017  . Alteration of sensation as late effect of stroke 03/08/2017  . Gait disturbance, post-stroke 03/08/2017  . Acute arterial ischemic stroke, vertebrobasilar, thalamic, left ([place]) 01/10/2017  . Neurologic gait disorder   . Depression   . Benign essential HTN   . History of breast cancer   . Stroke (cerebrum) ([place]) 01/08/2017  . TIA (transient ischemic attack) 12/16/2016  . Dysarthria 12/16/2016  . Leg weakness 12/16/2016  . EKG abnormalities 12/16/2016  . Chronic pain 12/16/2016  . Elevated blood pressure 12/22/2014  . Internal hemorrhoids with bleeding and prolapse 11/04/2014  . Lactose intolerance 11/04/2014  . Hemorrhoid 09/11/2014  . Rectal bleeding 09/11/2014  . IBS (irritable bowel syndrome) 09/25/2013  . Hyperparathyroidism, primary ([place]) 05/26/2011  . Osteoporosis 05/09/2009  . POLYCYTHEMIA 04/23/2009  . IMPAIRED FASTING GLUCOSE 11/14/2008  . BREAST CANCER, HX OF 12/06/2007  . Anxiety state 10/13/2007  . Essential hypertension 10/13/2007  . INTERSTITIAL CYSTITIS 09/06/2007  . HLD (hyperlipidemia) 03/01/2007  . Backache 03/01/2007    Past Surgical History:  Procedure Laterality Date  . ABDOMINAL HYSTERECTOMY    . BACK SURGERY    . BREAST SURGERY     mastectomy -right  . CERVICAL SPINE SURGERY    . CHOLECYSTECTOMY    . COLONOSCOPY W/ BIOPSIES    . cortisone injection  10/16/12  left knee  . FIXATION KYPHOPLASTY LUMBAR SPINE    . HEMORRHOID BANDING  2015-16  . PARATHYROID EXPLORATION  06/29/2011   Procedure: PARATHYROID EXPLORATION;  Surgeon: Earnstine Regal, MD;  Location: [facility] ORS;  Service: General;  Laterality: Left;  Left Superior Parathyroidectomy  . PARATHYROIDECTOMY  06/29/11     OB History   No obstetric history on file.     Family History  Problem Relation Age of Onset  . Heart disease Sister    . Hypertension Sister   . Asthma Sister   . Breast cancer Maternal Aunt   . Ovarian cancer Paternal Aunt   . Colon cancer Neg Hx   . Esophageal cancer Neg Hx   . Pancreatic cancer Neg Hx   . Rectal cancer Neg Hx   . Stomach cancer Neg Hx     Social History   Tobacco Use  . Smoking status: Former Smoker    Packs/day: 1.00    Years: 42.00    Pack years: 42.00    Types: Cigarettes    Quit date: 08/23/1977    Years since quitting: 42.5  . Smokeless tobacco: Never Used  Vaping Use  . Vaping Use: Never used  Substance Use Topics  . Alcohol use: No  . Drug use: No    Home Medications Prior to Admission medications   Medication Sig Start Date End Date Taking? Authorizing Provider  carisoprodol (SOMA) 250 MG tablet Take 250 mg by mouth daily.     [provider]  carisoprodol (SOMA) 350 MG tablet Take 350 mg by mouth at bedtime.    [provider]  celecoxib (CELEBREX) 200 MG capsule Take 1 capsule (200 mg total) by mouth daily as needed (arthritis pain). Patient not taking: Reported on 03/02/2020 01/18/17   Angiulli, Lavon Paganini, PA-C  Cholecalciferol (VITAMIN D3) 1000 units CAPS Take 1 capsule (1,000 Units total) by mouth daily. Patient not taking: Reported on 03/02/2020 01/18/17   Angiulli, Lavon Paganini, PA-C  diazepam (VALIUM) 5 MG tablet Take 1/2 tablet in AM (2.5mg ) and 1 tablet at night (5mg ). Patient taking differently: Take 2.8-5 mg by mouth See admin instructions. Take 1/2 tablet every morning and take 1 tablet at night 07/23/19   Brimage, Vondra, DO  EPINEPHrine 0.3 mg/0.3 mL IJ SOAJ injection Inject 0.3 mLs (0.3 mg total) into the muscle as needed for anaphylaxis. 10/25/19   Tegeler, Gwenyth Allegra, MD  fluticasone (FLONASE) 50 MCG/ACT nasal spray Place 2 sprays into both nostrils daily. Patient not taking: Reported on 03/02/2020 09/06/14   Hoyt Koch, MD  metoprolol tartrate (LOPRESSOR) 25 MG tablet Take 25 mg by mouth 2 (two) times daily. 10/12/19    [provider]  mirabegron ER (MYRBETRIQ) 50 MG TB24 tablet Take 50 mg by mouth daily.    [provider]  oxyCODONE-acetaminophen (PERCOCET/ROXICET) 5-325 MG tablet Take 1 tablet by mouth every 8 (eight) hours as needed for severe pain. Patient not taking: Reported on 03/02/2020 07/23/19   Lyndee Hensen, DO  PREMARIN 0.3 MG tablet Take 0.3 mg by mouth daily. 01/30/20   [provider]  WELLBUTRIN XL 300 MG 24 hr tablet Take 300 mg by mouth daily.  01/30/20   [provider]    Allergies    Anectine [succinylcholine chloride], Dilaudid [hydromorphone hcl], Hydrocodone-acetaminophen, Iodine, Naproxen sodium, Oxycodone hcl, Pentosan polysulfate sodium, Pseudoephedrine, Succinylcholine, Tape, Tramadol hcl, and Shellfish allergy  Review of Systems   Review of Systems  Constitutional: Negative for appetite change and  fatigue.  HENT: Negative for congestion, ear discharge and sinus pressure.   Eyes: Negative for discharge.  Respiratory: Negative for cough.   Cardiovascular: Negative for chest pain.  Gastrointestinal: Negative for abdominal pain and diarrhea.  Genitourinary: Negative for frequency and hematuria.  Musculoskeletal: Negative for back pain.       Weakness in right thigh  Skin: Negative for rash.  Neurological: Positive for weakness. Negative for seizures and headaches.  Psychiatric/Behavioral: Negative for hallucinations.    Physical Exam Updated Vital Signs BP (!) 149/94 (BP Location: Left Arm)   Pulse (!) 126   Temp 98.6 F (37 C) (Oral)   Resp 17   SpO2 95%   Physical Exam Vitals and nursing note reviewed.  Constitutional:      Appearance: She is well-developed.  HENT:     Head: Normocephalic.     Nose: Nose normal.  Eyes:     General: No scleral icterus.    Conjunctiva/sclera: Conjunctivae normal.  Neck:     Thyroid: No thyromegaly.  Cardiovascular:     Rate and Rhythm: Regular rhythm.     Heart sounds: No murmur heard.   No friction rub. No gallop.      Comments: Tachycardia Pulmonary:     Breath sounds: No stridor. No wheezing or rales.  Chest:     Chest wall: No tenderness.  Abdominal:     General: There is no distension.     Tenderness: There is no abdominal tenderness. There is no rebound.  Musculoskeletal:        General: Normal range of motion.     Cervical back: Neck supple.  Lymphadenopathy:     Cervical: No cervical adenopathy.  Skin:    Findings: No erythema or rash.  Neurological:     Mental Status: She is alert and oriented to person, place, and time.     Motor: No abnormal muscle tone.     Coordination: Coordination normal.     Comments: Numbness to right thigh  Psychiatric:        Behavior: Behavior normal.     ED Results / Procedures / Treatments   Labs (all labs ordered are listed, but only abnormal results are displayed) Labs Reviewed  CBC WITH DIFFERENTIAL/PLATELET  COMPREHENSIVE METABOLIC PANEL  URINALYSIS, ROUTINE W REFLEX MICROSCOPIC  TROPONIN I (HIGH SENSITIVITY)    EKG None  Radiology DG Chest 2 View  Result Date: 03/05/2020 CLINICAL DATA:  Left lateral chest pain. EXAM: CHEST - 2 VIEW COMPARISON:  Chest x-ray dated October 25, 2019. FINDINGS: The heart size and mediastinal contours are within normal limits. Atherosclerotic calcification of the aortic arch. Normal pulmonary vascularity. No focal consolidation, pleural effusion, or pneumothorax. No acute osseous abnormality. Chronic right rib fractures and thoracolumbar compression deformities. Prior right axillary lymph node dissection. IMPRESSION: No active cardiopulmonary disease. Electronically Signed   By: Titus Dubin M.D.   On: 03/05/2020 12:45   MR BRAIN WO CONTRAST  Result Date: 03/05/2020 CLINICAL DATA:  Ataxia, stroke suspected. Additional history provided: Paresthesia to right arm and right leg. EXAM: MRI HEAD WITHOUT CONTRAST TECHNIQUE: Multiplanar, multiecho pulse sequences of the brain and  surrounding structures were obtained without intravenous contrast. COMPARISON:  Brain MRI 07/17/2011 FINDINGS: Brain: Mild intermittent motion degradation. Stable, mild generalized parenchymal atrophy. As before, there is advanced patchy T2/FLAIR hyperintensity within the cerebral white matter which is nonspecific, but consistent with chronic small vessel ischemic disease. Redemonstrated chronic lacunar infarcts within the left thalamus and posterior left subinsular region.  There is a chronic lacunar infarct within the right thalamus which was not present on the prior examination of 07/17/2019. There is no acute infarct. No evidence of intracranial mass. No chronic intracranial blood products. No extra-axial fluid collection. No midline shift. Vascular: Expected proximal arterial flow voids. Skull and upper cervical spine: No focal marrow lesion. Sinuses/Orbits: Visualized orbits show no acute finding. No significant paranasal sinus disease or mastoid effusion at the imaged levels. IMPRESSION: Mildly motion degraded examination. No evidence of acute intracranial abnormality, including acute infarction. As before, there are advanced chronic small vessel ischemic changes within the cerebral white matter. Redemonstrated chronic lacunar infarcts within the left thalamus and posterior left subinsular region. A chronic right thalamic lacunar infarct was not present on the prior MRI of 07/17/2019. Stable, mild generalized parenchymal atrophy. Electronically Signed   By: Kellie Simmering DO   On: 03/05/2020 14:02    Procedures Procedures (including critical care time)  Medications Ordered in ED Medications  sodium chloride 0.9 % bolus 1,000 mL (1,000 mLs Intravenous New Bag/Given 03/05/20 1624)  metoprolol tartrate (LOPRESSOR) injection 5 mg (5 mg Intravenous Given 03/05/20 1621)    ED Course  I have reviewed the triage vital signs and the nursing notes.  Pertinent labs & imaging results that were available during  my care of the patient were reviewed by me and considered in my medical decision making (see chart for details).    MDM Rules/Calculators/A&P                          Patient with MRI of the brain negative.  Chest x-ray unremarkable.  Suspect symptoms to leg is just related to the contusion.  Patient is also tachycardic and labs pending     ,jz  This patient presents to the ED for concern of right leg numbness, this involves an extensive number of treatment options, and is a complaint that carries with it a high risk of complications and morbidity.  The differential diagnosis includes neuritis from contusion   Lab Tests:   I Ordered, reviewed, and interpreted labs, which included CBC and chemistries and troponin shows mild elevated troponin and glucose  Medicines ordered:   I ordered medication beta-blocker for rapid heart rate  Imaging Studies ordered:   I ordered imaging studies which included MRI brain and  I independently visualized and interpreted imaging which showed negative  Additional history obtained:   Additional history obtained from records  Previous records obtained and reviewed.  Consultations Obtained:     Reevaluation:  After the interventions stated above, I reevaluated the patient and found moderate improvement  Critical Interventions:  .   Final Clinical Impression(s) / ED Diagnoses Final diagnoses:  None    Rx / DC Orders ED Discharge Orders    None       Milton Ferguson, MD 03/06/20 [phone]

## 2020-03-05 NOTE — Discharge Instructions (Addendum)
MRI negative for any acute abnormalities.  Patient's labs without any significant M abnormalities including troponins x2 to rule out any kind of cardiac event.  Patient had some complaint of chest pain.  Urinalysis negative as well.  Patient can be discharged back to nursing facility.  In addition patient's heart rate improved with her normal medications.

## 2020-03-05 NOTE — ED Notes (Signed)
[place] Inches patient's POA [phone]

## 2020-03-05 NOTE — ED Triage Notes (Signed)
Pt BIBA from [facility]  Per EMS: [facility] yesterday. At [facility] for rehab for muscle tear in leg. Pt Hx of dementia. Wanting to be evaluated to have "peace of mind." Pt C/O paraesthesia to right arm and right leg.

## 2020-03-05 NOTE — ED Notes (Signed)
Patient transported to MRI 

## 2020-03-05 NOTE — ED Notes (Signed)
Patient given meal tray.

## 2020-03-06 NOTE — ED Notes (Signed)
Patient changed and bedding replaced.

## 2020-04-11 ENCOUNTER — Other Ambulatory Visit: Payer: Self-pay

## 2020-04-11 ENCOUNTER — Emergency Department (HOSPITAL_COMMUNITY)
Admission: EM | Admit: 2020-04-11 | Discharge: 2020-04-12 | Disposition: A | Discharge status: Home / Self Care | Payer: Medicare Other | Attending: Emergency Medicine | Admitting: Emergency Medicine

## 2020-04-11 ENCOUNTER — Emergency Department (HOSPITAL_COMMUNITY): Payer: Medicare Other

## 2020-04-11 ENCOUNTER — Encounter (HOSPITAL_COMMUNITY): Payer: Self-pay | Admitting: Pediatrics

## 2020-04-11 DIAGNOSIS — S62645A Nondisplaced fracture of proximal phalanx of left ring finger, initial encounter for closed fracture: Secondary | ICD-10-CM | POA: Diagnosis not present

## 2020-04-11 DIAGNOSIS — Z87891 Personal history of nicotine dependence: Secondary | ICD-10-CM | POA: Insufficient documentation

## 2020-04-11 DIAGNOSIS — F0390 Unspecified dementia without behavioral disturbance: Secondary | ICD-10-CM | POA: Diagnosis not present

## 2020-04-11 DIAGNOSIS — Y939 Activity, unspecified: Secondary | ICD-10-CM | POA: Diagnosis not present

## 2020-04-11 DIAGNOSIS — Y929 Unspecified place or not applicable: Secondary | ICD-10-CM | POA: Insufficient documentation

## 2020-04-11 DIAGNOSIS — X58XXXA Exposure to other specified factors, initial encounter: Secondary | ICD-10-CM | POA: Insufficient documentation

## 2020-04-11 DIAGNOSIS — Z853 Personal history of malignant neoplasm of breast: Secondary | ICD-10-CM | POA: Insufficient documentation

## 2020-04-11 DIAGNOSIS — Z9011 Acquired absence of right breast and nipple: Secondary | ICD-10-CM | POA: Insufficient documentation

## 2020-04-11 DIAGNOSIS — S62643A Nondisplaced fracture of proximal phalanx of left middle finger, initial encounter for closed fracture: Secondary | ICD-10-CM | POA: Diagnosis not present

## 2020-04-11 DIAGNOSIS — I10 Essential (primary) hypertension: Secondary | ICD-10-CM | POA: Insufficient documentation

## 2020-04-11 DIAGNOSIS — Z79899 Other long term (current) drug therapy: Secondary | ICD-10-CM | POA: Diagnosis not present

## 2020-04-11 DIAGNOSIS — S6992XA Unspecified injury of left wrist, hand and finger(s), initial encounter: Secondary | ICD-10-CM | POA: Diagnosis present

## 2020-04-11 DIAGNOSIS — Z8673 Personal history of transient ischemic attack (TIA), and cerebral infarction without residual deficits: Secondary | ICD-10-CM | POA: Insufficient documentation

## 2020-04-11 DIAGNOSIS — Y999 Unspecified external cause status: Secondary | ICD-10-CM | POA: Diagnosis not present

## 2020-04-11 NOTE — ED Notes (Signed)
Gave pt two blankets and a chair to prop her feet up.

## 2020-04-11 NOTE — ED Triage Notes (Signed)
Patient arrived via EMS, from [facility]. Reported witnessed fall by NH staff last night; XRAY findings of left hand fracture. Here in ED for further eval,

## 2020-04-12 NOTE — Discharge Instructions (Addendum)
Continue taking home medications as prescribed. Use Tylenol as needed for pain. Use ice to help with pain and swelling. Use the splint/wrap to help with pain control.  Follow up with your primary care doctor and/or your orthopedic doctor in 1 week for recheck of your hand.  Return to the ER with any new, worsening, or concerning symptoms.

## 2020-04-12 NOTE — ED Provider Notes (Signed)
[facility] EMERGENCY DEPARTMENT Provider Note   CSN: [id number] Arrival date & time: 04/11/20  1604     History Chief Complaint  Patient presents with  . Hand Injury    [name] is a 86 y.o. female presenting for evaluation of left hand pain after a fall.  Level 5 caveat due to dementia.  Patient states she is having pain in her left hand.  She cannot tell me when this began.  She reports pain mostly in the tips of her fingers, and goes down to her wrist.  It is worse with movement and palpation.  She has not taken anything for it.  No new numbness.  HPI     Past Medical History:  Diagnosis Date  . Adenomatous colon polyp 04/2005  . Allergic    Anectine  . ANXIETY 10/13/2007  . BACK PAIN 03/01/2007  . BREAST CANCER, HX OF 12/06/2007  . Cancer [facility])    RIGHT mastectomy with node dissection  . Complication of anesthesia    family aggergy to annectine  . Compression fracture of L4 lumbar vertebra 02/2013  . CONSTIPATION 01/06/2009  . Dementia ([place]) 2020   Per POA  . DEPRESSION 10/13/2007  . Diverticular disease   . DIVERTICULOSIS, COLON 05/11/2005  . External hemorrhoids without mention of complication 7/32/2025  . FATIGUE 08/07/2008  . HEMORRHOIDS, INTERNAL 05/11/2005  . Hypercalcemia 04/23/2009  . HYPERLIPIDEMIA NEC/NOS 03/01/2007  . HYPERTENSION 10/13/2007  . Impaired fasting glucose 11/14/2008  . INTERSTITIAL CYSTITIS 09/06/2007  . Irritable bowel syndrome 01/06/2009  . LIVER MASS 05/15/2009  . OSTEOPOROSIS 05/09/2009  . PARESTHESIA 09/06/2007  . POLYCYTHEMIA 04/23/2009  . STENOSIS, SPINAL, UNSPC REGION 03/01/2007  . Thyroid disease     Patient Active Problem List   Diagnosis Date Noted  . Syncope 07/17/2019  . Dementia ([place]) 2020  . Cognitive deficit due to recent stroke 03/08/2017  . Alteration of sensation as late effect of stroke 03/08/2017  . Gait disturbance, post-stroke 03/08/2017  . Acute arterial ischemic stroke, vertebrobasilar,  thalamic, left ([place]) 01/10/2017  . Neurologic gait disorder   . Depression   . Benign essential HTN   . History of breast cancer   . Stroke (cerebrum) ([place]) 01/08/2017  . TIA (transient ischemic attack) 12/16/2016  . Dysarthria 12/16/2016  . Leg weakness 12/16/2016  . EKG abnormalities 12/16/2016  . Chronic pain 12/16/2016  . Elevated blood pressure 12/22/2014  . Internal hemorrhoids with bleeding and prolapse 11/04/2014  . Lactose intolerance 11/04/2014  . Hemorrhoid 09/11/2014  . Rectal bleeding 09/11/2014  . IBS (irritable bowel syndrome) 09/25/2013  . Hyperparathyroidism, primary ([place]) 05/26/2011  . Osteoporosis 05/09/2009  . POLYCYTHEMIA 04/23/2009  . IMPAIRED FASTING GLUCOSE 11/14/2008  . BREAST CANCER, HX OF 12/06/2007  . Anxiety state 10/13/2007  . Essential hypertension 10/13/2007  . INTERSTITIAL CYSTITIS 09/06/2007  . HLD (hyperlipidemia) 03/01/2007  . Backache 03/01/2007    Past Surgical History:  Procedure Laterality Date  . ABDOMINAL HYSTERECTOMY    . BACK SURGERY    . BREAST SURGERY     mastectomy -right  . CERVICAL SPINE SURGERY    . CHOLECYSTECTOMY    . COLONOSCOPY W/ BIOPSIES    . cortisone injection  10/16/12   left knee  . FIXATION KYPHOPLASTY LUMBAR SPINE    . HEMORRHOID BANDING  2015-16  . PARATHYROID EXPLORATION  06/29/2011   Procedure: PARATHYROID EXPLORATION;  Surgeon: Earnstine Regal, MD;  Location: [facility];  Service: General;  Laterality: Left;  Left Superior Parathyroidectomy  . PARATHYROIDECTOMY  06/29/11     OB History   No obstetric history on file.     Family History  Problem Relation Age of Onset  . Heart disease Sister   . Hypertension Sister   . Asthma Sister   . Breast cancer Maternal Aunt   . Ovarian cancer Paternal Aunt   . Colon cancer Neg Hx   . Esophageal cancer Neg Hx   . Pancreatic cancer Neg Hx   . Rectal cancer Neg Hx   . Stomach cancer Neg Hx     Social History   Tobacco Use  . Smoking status: Former Smoker     Packs/day: 1.00    Years: 42.00    Pack years: 42.00    Types: Cigarettes    Quit date: 08/23/1977    Years since quitting: 42.6  . Smokeless tobacco: Never Used  Vaping Use  . Vaping Use: Never used  Substance Use Topics  . Alcohol use: No  . Drug use: No    Home Medications Prior to Admission medications   Medication Sig Start Date End Date Taking? Authorizing Provider  Calcium Carbonate-Vit D-Min (CALCIUM 1200 PO) Take 1,200 mg by mouth daily.    [provider]  carisoprodol (SOMA) 250 MG tablet Take 250 mg by mouth daily. morning    [provider]  carisoprodol (SOMA) 350 MG tablet Take 350 mg by mouth at bedtime.    [provider]  celecoxib (CELEBREX) 200 MG capsule Take 1 capsule (200 mg total) by mouth daily as needed (arthritis pain). Patient taking differently: Take 200 mg by mouth daily.  01/18/17   Angiulli, Lavon Paganini, PA-C  Cholecalciferol (VITAMIN D3) 1000 units CAPS Take 1 capsule (1,000 Units total) by mouth daily. 01/18/17   Angiulli, Lavon Paganini, PA-C  Cyanocobalamin (VITAMIN B 12 PO) Take 1 tablet by mouth daily.    [provider]  diazepam (VALIUM) 5 MG tablet Take 1/2 tablet in AM (2.5mg ) and 1 tablet at night (5mg ). Patient taking differently: Take 5 mg by mouth in the morning and at bedtime.  07/23/19   Brimage, Ronnette Juniper, DO  EPINEPHrine 0.3 mg/0.3 mL IJ SOAJ injection Inject 0.3 mLs (0.3 mg total) into the muscle as needed for anaphylaxis. 10/25/19   Tegeler, Gwenyth Allegra, MD  fluticasone (FLONASE) 50 MCG/ACT nasal spray Place 2 sprays into both nostrils daily. Patient not taking: Reported on 03/02/2020 09/06/14   Hoyt Koch, MD  Melatonin 10 MG CAPS Take 40 mg by mouth daily.    [provider]  metoprolol tartrate (LOPRESSOR) 50 MG tablet Take 50 mg by mouth 2 (two) times daily.    [provider]  mirabegron ER (MYRBETRIQ) 50 MG TB24 tablet Take 50 mg by mouth daily.    [provider]    oxyCODONE-acetaminophen (PERCOCET/ROXICET) 5-325 MG tablet Take 1 tablet by mouth every 8 (eight) hours as needed for severe pain. Patient not taking: Reported on 03/02/2020 07/23/19   Lyndee Hensen, DO  PREMARIN 0.3 MG tablet Take 0.3 mg by mouth daily. 01/30/20   [provider]  Vitamin E 100 units TABS Take 100 Units by mouth daily.    [provider]  WELLBUTRIN XL 300 MG 24 hr tablet Take 300 mg by mouth daily.  01/30/20   [provider]    Allergies    Anectine [succinylcholine chloride], Dilaudid [hydromorphone hcl], Hydrocodone-acetaminophen, Iodine, Naproxen sodium, Oxycodone hcl, Pentosan polysulfate sodium, Pseudoephedrine, Succinylcholine, Tape, Tramadol  hcl, and Shellfish allergy  Review of Systems   Review of Systems  Musculoskeletal: Positive for arthralgias.    Physical Exam Updated Vital Signs BP (!) 177/79 (BP Location: Right Arm)   Pulse 75   Temp 98.9 F (37.2 C) (Oral)   Resp 16   SpO2 95%   Physical Exam Vitals and nursing note reviewed.  Constitutional:      General: She is not in acute distress.    Appearance: She is well-developed.  HENT:     Head: Normocephalic and atraumatic.  Pulmonary:     Effort: Pulmonary effort is normal.  Abdominal:     General: There is no distension.  Musculoskeletal:        General: Normal range of motion.     Cervical back: Normal range of motion.     Comments: No obvious deformity of the left hand.  Radial pulses 2+ bilaterally.  Good distal sensation and cap refill.  Tenderness palpation at the base of the third and fourth fingers of the left hand.  Patient also with tenderness palpation of the thenar and hypothenar eminence, no tenderness to palpation of the carpals or the distal forearm.  Skin:    General: Skin is warm.     Capillary Refill: Capillary refill takes less than 2 seconds.     Findings: No rash.  Neurological:     Mental Status: She is alert and oriented to person, place, and  time.     ED Results / Procedures / Treatments   Labs (all labs ordered are listed, but only abnormal results are displayed) Labs Reviewed - No data to display  EKG None  Radiology DG Hand Complete Left  Result Date: 04/11/2020 CLINICAL DATA:  Status post fall. EXAM: LEFT HAND - COMPLETE 3+ VIEW COMPARISON:  None. FINDINGS: Very thin, ill-defined linear lucencies are seen overlying the bases of the proximal phalanges of the third and fourth left fingers. There is no evidence of dislocation. There is no evidence of arthropathy or other focal bone abnormality. There is mild vascular calcification. IMPRESSION: Possible nondisplaced fractures involving the bases of the proximal phalanges of the third and fourth left fingers. Electronically Signed   By: Virgina Norfolk M.D.   On: 04/11/2020 20:47    Procedures Procedures (including critical care time)  Medications Ordered in ED Medications - No data to display  ED Course  I have reviewed the triage vital signs and the nursing notes.  Pertinent labs & imaging results that were available during my care of the patient were reviewed by me and considered in my medical decision making (see chart for details).    MDM Rules/Calculators/A&P                          Patient presenting for evaluation of left hand pain.  On exam, patient is neurovascularly intact.  X-ray obtained from triage read interpreted by me, shows bony abnormality at the base of the proximal phalanges of the third and fourth fingers.  Patient does have pain in this location, as such, likely fracture.  Additionally, patient with pain at the base of her hand, likely MSK.  She does not have any carpal pain.  Will place patient in a volar splint to help with stabilization and pain control.  Will have patient follow-up with her PCP and/or orthopedic doctor (per pt request).  Discussed findings and plan with patient.  At this time, patient appears safe for discharge.  Return  precautions given.  Patient states she understands and agrees to plan.  Final Clinical Impression(s) / ED Diagnoses Final diagnoses:  Injury of left hand, initial encounter  Closed nondisplaced fracture of proximal phalanx of left middle finger, initial encounter  Closed nondisplaced fracture of proximal phalanx of left ring finger, initial encounter    Rx / DC Orders ED Discharge Orders    None       Franchot Heidelberg, PA-C 04/12/20 0150    Mesner, Corene Cornea, MD 04/12/20 [phone]

## 2020-04-12 NOTE — ED Notes (Signed)
[facility] has been notified of transport request back to [facility]

## 2020-05-04 ENCOUNTER — Emergency Department (HOSPITAL_COMMUNITY): Payer: Medicare Other

## 2020-05-04 ENCOUNTER — Other Ambulatory Visit: Payer: Self-pay

## 2020-05-04 ENCOUNTER — Encounter (HOSPITAL_COMMUNITY): Payer: Self-pay | Admitting: Emergency Medicine

## 2020-05-04 ENCOUNTER — Emergency Department (HOSPITAL_COMMUNITY)
Admission: EM | Admit: 2020-05-04 | Discharge: 2020-05-04 | Disposition: A | Discharge status: Home / Self Care | Payer: Medicare Other | Attending: Emergency Medicine | Admitting: Emergency Medicine

## 2020-05-04 DIAGNOSIS — F0390 Unspecified dementia without behavioral disturbance: Secondary | ICD-10-CM | POA: Diagnosis not present

## 2020-05-04 DIAGNOSIS — Z87891 Personal history of nicotine dependence: Secondary | ICD-10-CM | POA: Insufficient documentation

## 2020-05-04 DIAGNOSIS — S62102A Fracture of unspecified carpal bone, left wrist, initial encounter for closed fracture: Secondary | ICD-10-CM

## 2020-05-04 DIAGNOSIS — I10 Essential (primary) hypertension: Secondary | ICD-10-CM | POA: Insufficient documentation

## 2020-05-04 DIAGNOSIS — M25532 Pain in left wrist: Secondary | ICD-10-CM | POA: Diagnosis not present

## 2020-05-04 DIAGNOSIS — Z79899 Other long term (current) drug therapy: Secondary | ICD-10-CM | POA: Diagnosis not present

## 2020-05-04 MED ORDER — DIAZEPAM 5 MG PO TABS
5.0000 mg | ORAL_TABLET | Freq: Once | ORAL | Status: AC
  Administered 2020-05-04: 5 mg via ORAL
  Filled 2020-05-04: qty 1

## 2020-05-04 MED ORDER — HALOPERIDOL LACTATE 5 MG/ML IJ SOLN
1.0000 mg | Freq: Once | INTRAMUSCULAR | Status: AC
  Administered 2020-05-04: 1 mg via INTRAMUSCULAR
  Filled 2020-05-04: qty 1

## 2020-05-04 NOTE — ED Notes (Addendum)
[facility] called to transport patient to [facility] facility

## 2020-05-04 NOTE — ED Notes (Signed)
Patient attempting to climb out of [facility] stretcher. PT[facility] states they cannot restrain patient. Ralene Bathe, MD notified and at the bedside

## 2020-05-04 NOTE — ED Triage Notes (Signed)
Patient BIBA from [facility], unwitnessed fall w/ no LOC. Patient states she tripped and caught herself with L hand. Patient has healing radial fracture of L hand. Hx dementia and HTN.  X-rays taken yesterday showed 4th metacarpal fracture and distal radial fracture
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
## 2020-05-04 NOTE — ED Notes (Signed)
[facility] unable to safely keep patient on stretcher. Patient continues to attempt to climb off stretcher because she does not want to go back to [facility]. Ralene Bathe, MD notified that [facility] cannot stay any longer.

## 2020-05-04 NOTE — ED Provider Notes (Signed)
Patient seen and examined, agree with assessment and plan by APP. Patient with mechanical fall at SNF. Wrist fx on xray. Splinted and return to SNF with outpatient followup.    Truddie Hidden, MD 05/04/20 1355

## 2020-05-04 NOTE — ED Notes (Signed)
POA is on the phone she wants patient to return to [facility] because pt. is a fall risk and can not be cared for at home. PT[facility] is currently here to transport patient back to [facility]. POA has no concerns at this time.

## 2020-05-04 NOTE — Discharge Instructions (Signed)
Please follow-up in the next 1 week with Dr. Fredonia Highland. Get help right away if: Your skin or fingers on your injured arm turn blue or gray. Your arm feels cold or numb. You have severe pain in your injured wrist.

## 2020-05-04 NOTE — ED Provider Notes (Signed)
[facility] DEPT Provider Note   CSN: [id number] Arrival date & time: 05/04/20  7619     History Chief Complaint  Patient presents with  . Fall    [name] is a 86 y.o. female sent in from [facility] for evaluation of left wrist pain.  Is a level 5 caveat due to dementia.  The patient is alert to self and place only.  History is gathered from the patient's nurse Toki.  She states that she got report from the night staff that the patient had an unwitnessed fall is complaining of left hand pain.  She had an x-ray done that showed a finger fracture according to the nurse and the doctor there told that they can have her come here and get evaluated.  She is not on any blood thinners.  HPI     Past Medical History:  Diagnosis Date  . Adenomatous colon polyp 04/2005  . Allergic    Anectine  . ANXIETY 10/13/2007  . BACK PAIN 03/01/2007  . BREAST CANCER, HX OF 12/06/2007  . Cancer [facility])    RIGHT mastectomy with node dissection  . Complication of anesthesia    family aggergy to annectine  . Compression fracture of L4 lumbar vertebra 02/2013  . CONSTIPATION 01/06/2009  . Dementia ([place]) 2020   Per POA  . DEPRESSION 10/13/2007  . Diverticular disease   . DIVERTICULOSIS, COLON 05/11/2005  . External hemorrhoids without mention of complication 12/29/3265  . FATIGUE 08/07/2008  . HEMORRHOIDS, INTERNAL 05/11/2005  . Hypercalcemia 04/23/2009  . HYPERLIPIDEMIA NEC/NOS 03/01/2007  . HYPERTENSION 10/13/2007  . Impaired fasting glucose 11/14/2008  . INTERSTITIAL CYSTITIS 09/06/2007  . Irritable bowel syndrome 01/06/2009  . LIVER MASS 05/15/2009  . OSTEOPOROSIS 05/09/2009  . PARESTHESIA 09/06/2007  . POLYCYTHEMIA 04/23/2009  . STENOSIS, SPINAL, UNSPC REGION 03/01/2007  . Thyroid disease     Patient Active Problem List   Diagnosis Date Noted  . Syncope 07/17/2019  . Dementia ([place]) 2020  . Cognitive deficit due to recent stroke 03/08/2017  . Alteration of  sensation as late effect of stroke 03/08/2017  . Gait disturbance, post-stroke 03/08/2017  . Acute arterial ischemic stroke, vertebrobasilar, thalamic, left ([place]) 01/10/2017  . Neurologic gait disorder   . Depression   . Benign essential HTN   . History of breast cancer   . Stroke (cerebrum) ([place]) 01/08/2017  . TIA (transient ischemic attack) 12/16/2016  . Dysarthria 12/16/2016  . Leg weakness 12/16/2016  . EKG abnormalities 12/16/2016  . Chronic pain 12/16/2016  . Elevated blood pressure 12/22/2014  . Internal hemorrhoids with bleeding and prolapse 11/04/2014  . Lactose intolerance 11/04/2014  . Hemorrhoid 09/11/2014  . Rectal bleeding 09/11/2014  . IBS (irritable bowel syndrome) 09/25/2013  . Hyperparathyroidism, primary ([place]) 05/26/2011  . Osteoporosis 05/09/2009  . POLYCYTHEMIA 04/23/2009  . IMPAIRED FASTING GLUCOSE 11/14/2008  . BREAST CANCER, HX OF 12/06/2007  . Anxiety state 10/13/2007  . Essential hypertension 10/13/2007  . INTERSTITIAL CYSTITIS 09/06/2007  . HLD (hyperlipidemia) 03/01/2007  . Backache 03/01/2007    Past Surgical History:  Procedure Laterality Date  . ABDOMINAL HYSTERECTOMY    . BACK SURGERY    . BREAST SURGERY     mastectomy -right  . CERVICAL SPINE SURGERY    . CHOLECYSTECTOMY    . COLONOSCOPY W/ BIOPSIES    . cortisone injection  10/16/12   left knee  . FIXATION KYPHOPLASTY LUMBAR SPINE    . HEMORRHOID BANDING  2015-16  .  PARATHYROID EXPLORATION  06/29/2011   Procedure: PARATHYROID EXPLORATION;  Surgeon: Earnstine Regal, MD;  Location: [facility];  Service: General;  Laterality: Left;  Left Superior Parathyroidectomy  . PARATHYROIDECTOMY  06/29/11     OB History   No obstetric history on file.     Family History  Problem Relation Age of Onset  . Heart disease Sister   . Hypertension Sister   . Asthma Sister   . Breast cancer Maternal Aunt   . Ovarian cancer Paternal Aunt   . Colon cancer Neg Hx   . Esophageal cancer Neg Hx   .  Pancreatic cancer Neg Hx   . Rectal cancer Neg Hx   . Stomach cancer Neg Hx     Social History   Tobacco Use  . Smoking status: Former Smoker    Packs/day: 1.00    Years: 42.00    Pack years: 42.00    Types: Cigarettes    Quit date: 08/23/1977    Years since quitting: 42.7  . Smokeless tobacco: Never Used  Vaping Use  . Vaping Use: Never used  Substance Use Topics  . Alcohol use: No  . Drug use: No    Home Medications Prior to Admission medications   Medication Sig Start Date End Date Taking? Authorizing Provider  carisoprodol (SOMA) 250 MG tablet Take 250 mg by mouth daily. morning   Yes [provider]  carisoprodol (SOMA) 350 MG tablet Take 350 mg by mouth at bedtime.   Yes [provider]  celecoxib (CELEBREX) 200 MG capsule Take 1 capsule (200 mg total) by mouth daily as needed (arthritis pain). Patient taking differently: Take 200 mg by mouth daily as needed for mild pain.  01/18/17  Yes Angiulli, Lavon Paganini, PA-C  Cholecalciferol (VITAMIN D3) 1000 units CAPS Take 1 capsule (1,000 Units total) by mouth daily. 01/18/17  Yes Angiulli, Lavon Paganini, PA-C  diazepam (VALIUM) 5 MG tablet Take 1/2 tablet in AM (2.5mg ) and 1 tablet at night (5mg ). Patient taking differently: Take 2.5-5 mg by mouth See admin instructions. 2.5mg  at 9am 5mg  at 9pm 07/23/19  Yes Brimage, Vondra, DO  EPINEPHrine 0.3 mg/0.3 mL IJ SOAJ injection Inject 0.3 mLs (0.3 mg total) into the muscle as needed for anaphylaxis. 10/25/19  Yes Tegeler, Gwenyth Allegra, MD  fluticasone (FLONASE) 50 MCG/ACT nasal spray Place 2 sprays into both nostrils daily. 09/06/14  Yes Hoyt Koch, MD  metoprolol tartrate (LOPRESSOR) 25 MG tablet Take 25 mg by mouth 2 (two) times daily.   Yes [provider]  mirabegron ER (MYRBETRIQ) 50 MG TB24 tablet Take 50 mg by mouth daily.   Yes [provider]  oxyCODONE-acetaminophen (PERCOCET/ROXICET) 5-325 MG tablet Take 1 tablet by mouth every 8 (eight)  hours as needed for severe pain. 07/23/19  Yes Brimage, Vondra, DO  PREMARIN 0.3 MG tablet Take 0.3 mg by mouth daily. 01/30/20  Yes [provider]  WELLBUTRIN XL 300 MG 24 hr tablet Take 300 mg by mouth daily.  01/30/20  Yes [provider]    Allergies    Anectine [succinylcholine chloride], Dilaudid [hydromorphone hcl], Hydrocodone-acetaminophen, Iodine, Naproxen sodium, Oxycodone hcl, Pentosan polysulfate sodium, Pseudoephedrine, Succinylcholine, Tape, Tramadol hcl, and Shellfish allergy  Review of Systems   Review of Systems Unable to review systems secondary to dementia  Physical Exam Updated Vital Signs BP (!) 154/72 (BP Location: Other (Comment))   Pulse 83   Temp 98.1 F (36.7 C) (Oral)   Resp 17   Ht  5\' 7"  (1.702 m)   Wt 62 kg   SpO2 96%   BMI 21.41 kg/m   Physical Exam Vitals and nursing note reviewed.  Constitutional:      General: She is not in acute distress.    Appearance: She is well-developed. She is not diaphoretic.  HENT:     Head: Normocephalic and atraumatic.  Eyes:     General: No scleral icterus.    Conjunctiva/sclera: Conjunctivae normal.  Cardiovascular:     Rate and Rhythm: Normal rate and regular rhythm.     Heart sounds: Normal heart sounds. No murmur heard.  No friction rub. No gallop.   Pulmonary:     Effort: Pulmonary effort is normal. No respiratory distress.     Breath sounds: Normal breath sounds.  Abdominal:     General: Bowel sounds are normal. There is no distension.     Palpations: Abdomen is soft. There is no mass.     Tenderness: There is no abdominal tenderness. There is no guarding.  Musculoskeletal:     Cervical back: Normal range of motion.     Comments: Left wrist with obvious deformity.  Brisk capillary refill and normal sensation.  Normal ipsilateral elbow and shoulder vaccination The remainder of the patient's musculoskeletal exam is within normal limits.  No obvious head or extremity trauma.  No bruising  to the abdomen, chest or back.  Range of motion of hips is within normal limits.  Skin:    General: Skin is warm and dry.  Neurological:     Mental Status: She is alert and oriented to person, place, and time.  Psychiatric:        Behavior: Behavior normal.     ED Results / Procedures / Treatments   Labs (all labs ordered are listed, but only abnormal results are displayed) Labs Reviewed - No data to display  EKG None  Radiology No results found.  Procedures Procedures (including critical care time)  SPLINT APPLICATION Date/Time: 6:37 AM Authorized by: Margarita Mail  Performed by: Ortho tech Location details: left wrist Splint type: sugartong Supplies used: orthoglass, cotton padding Post-procedure: The splinted body part was neurovascularly unchanged following the procedure. Patient tolerance: Patient tolerated the procedure well with no immediate complications.    Medications Ordered in ED Medications - No data to display  ED Course  I have reviewed the triage vital signs and the nursing notes.  Pertinent labs & imaging results that were available during my care of the patient were reviewed by me and considered in my medical decision making (see chart for details).    MDM Rules/Calculators/A&P                          86 year old female here after unwitnessed fall with left wrist deformity.  I have ordered reviewed images of the left hand, left wrist, plain films of the chest and pelvis as well as a CT head and C-spine.  The only findings are on the hand and wrist plain films which show a left distal radius fracture.  The remainder of the imaging is within normal limits.  Reviewed images with Dr. Karle Starch and we agree that she could be placed in sugar tong splint with outpatient hand follow-up.  Patient given a sling at discharge.  She will be discharged to the care of her skilled nursing facility and appears otherwise appropriate for discharge at this time. Final  Clinical Impression(s) / ED Diagnoses Final diagnoses:  None    Rx / DC Orders ED Discharge Orders    None       Margarita Mail, PA-C 05/06/20 0829    Truddie Hidden, MD 05/06/20 1246

## 2020-06-18 ENCOUNTER — Ambulatory Visit: Payer: Medicare Other | Admitting: Family

## 2020-06-24 ENCOUNTER — Ambulatory Visit: Payer: Medicare Other | Admitting: Family

## 2020-07-14 ENCOUNTER — Emergency Department (HOSPITAL_COMMUNITY)
Admission: EM | Admit: 2020-07-14 | Discharge: 2020-07-14 | Disposition: A | Discharge status: Home / Self Care | Payer: Medicare Other | Attending: Emergency Medicine | Admitting: Emergency Medicine

## 2020-07-14 ENCOUNTER — Emergency Department (HOSPITAL_COMMUNITY): Payer: Medicare Other

## 2020-07-14 ENCOUNTER — Other Ambulatory Visit: Payer: Self-pay

## 2020-07-14 DIAGNOSIS — W050XXA Fall from non-moving wheelchair, initial encounter: Secondary | ICD-10-CM | POA: Diagnosis not present

## 2020-07-14 DIAGNOSIS — W19XXXA Unspecified fall, initial encounter: Secondary | ICD-10-CM

## 2020-07-14 DIAGNOSIS — F0390 Unspecified dementia without behavioral disturbance: Secondary | ICD-10-CM | POA: Insufficient documentation

## 2020-07-14 DIAGNOSIS — M25552 Pain in left hip: Secondary | ICD-10-CM | POA: Diagnosis not present

## 2020-07-14 DIAGNOSIS — Y92129 Unspecified place in nursing home as the place of occurrence of the external cause: Secondary | ICD-10-CM | POA: Insufficient documentation

## 2020-07-14 DIAGNOSIS — Z79899 Other long term (current) drug therapy: Secondary | ICD-10-CM | POA: Diagnosis not present

## 2020-07-14 DIAGNOSIS — Z8673 Personal history of transient ischemic attack (TIA), and cerebral infarction without residual deficits: Secondary | ICD-10-CM | POA: Diagnosis not present

## 2020-07-14 DIAGNOSIS — M25559 Pain in unspecified hip: Secondary | ICD-10-CM

## 2020-07-14 DIAGNOSIS — Z87891 Personal history of nicotine dependence: Secondary | ICD-10-CM | POA: Diagnosis not present

## 2020-07-14 DIAGNOSIS — Z853 Personal history of malignant neoplasm of breast: Secondary | ICD-10-CM | POA: Insufficient documentation

## 2020-07-14 DIAGNOSIS — S0083XA Contusion of other part of head, initial encounter: Secondary | ICD-10-CM | POA: Insufficient documentation

## 2020-07-14 DIAGNOSIS — M25512 Pain in left shoulder: Secondary | ICD-10-CM | POA: Diagnosis not present

## 2020-07-14 DIAGNOSIS — I10 Essential (primary) hypertension: Secondary | ICD-10-CM | POA: Insufficient documentation

## 2020-07-14 DIAGNOSIS — S0990XA Unspecified injury of head, initial encounter: Secondary | ICD-10-CM | POA: Diagnosis present

## 2020-07-14 MED ORDER — DIAZEPAM 5 MG PO TABS
2.5000 mg | ORAL_TABLET | ORAL | Status: DC
  Administered 2020-07-14: 5 mg via ORAL
  Filled 2020-07-14 (×2): qty 1

## 2020-07-14 MED ORDER — METOPROLOL TARTRATE 25 MG PO TABS
25.0000 mg | ORAL_TABLET | Freq: Two times a day (BID) | ORAL | Status: DC
  Administered 2020-07-14: 25 mg via ORAL
  Filled 2020-07-14: qty 1

## 2020-07-14 NOTE — ED Notes (Signed)
Pt grabbing at nurse, refuses to have her BP checked.

## 2020-07-14 NOTE — ED Provider Notes (Signed)
Elderly 86 year old female with dementia comes from [facility] where she had some type of fall though it was unwitnessed, she was on the ground beside her wheelchair.  No head injury, no complaint of head injuries, the patient is somewhat agitated and refuses to talk unless she goes to [facility].  She states she hates this emergency department, she does not give any further information.  On my exam the patient is resistant to moving her left shoulder or her left hip but there is no obvious deformities or bruising to the skin to suggest an acute injury.  Her heart and lung exams are unremarkable, her EKG shows a slight increase in rate from prior, stable right bundle branch block but no signs of ischemia or arrhythmia.  Her vital signs suggest hypertension at 195/69, this will be rechecked, again the Patient is agitated which may lead to some of this elevation.  She otherwise appears benign, airways intact, neurologically she refuses to move anything and tells me that she refuses to move anything.  She states "I am not complying with this exam".  Level 5 caveat  Medical screening examination/treatment/procedure(s) were conducted as a shared visit with non-physician practitioner(s) and myself.  I personally evaluated the patient during the encounter.  Clinical Impression:   Final diagnoses:  Fall, initial encounter  Hypertension, unspecified type         Noemi Chapel, MD 07/15/20 [phone]

## 2020-07-14 NOTE — Discharge Instructions (Addendum)
Patient presented to the emergency department today after suffering an unwitnessed fall.  Head CT, left shoulder, left hip and pelvis imaging were obtained and showed no acute fracture or injuries.  Patient remained alert stable throughout her hospital stay.   Please ensure that patient receives her prescribed Lopressor and other medications upon return to your facility.    Patient is likely to fall again so please watch as closely as you can.  Please seek emergent medical attention for patient if she:   Develops severe headache that is not helped by medicine. Has trouble walking or weakness her your arms and legs. Has changes in her vision. A seizure. Vomiting Change in pupil size. Speech is slurred. Loss of consciousness She is sleepier than normal and has trouble staying awake. Develops altered mental status outside of her baseline

## 2020-07-14 NOTE — ED Provider Notes (Signed)
[facility] EMERGENCY DEPARTMENT Provider Note   CSN: [id number] Arrival date & time: 07/14/20  1020     History Chief Complaint  Patient presents with  . Fall    [name] is a 86 y.o. female.  86 year old female with a history of dementia, hypertension, breast cancer, history of stroke, and history of TIA.  Patient was transported to the emergency department via EMS from [facility] care facility.  EMS reports that patient was found on the floor of her room after suffering an unwitnessed fall.  Patient is not on any blood thinners. Upon examination patient is alert and uncooperative.  Patient states "I want to go to [facility]  I will answer only answer questions at [facility]"  Patient has no other obvious injuries or deformities.  Patient states "I am not complying with this exam."  Patient complains of pain on passive range of motion of left shoulder and left hip.             Past Medical History:  Diagnosis Date  . Adenomatous colon polyp 04/2005  . Allergic    Anectine  . ANXIETY 10/13/2007  . BACK PAIN 03/01/2007  . BREAST CANCER, HX OF 12/06/2007  . Cancer [facility])    RIGHT mastectomy with node dissection  . Complication of anesthesia    family aggergy to annectine  . Compression fracture of L4 lumbar vertebra 02/2013  . CONSTIPATION 01/06/2009  . Dementia ([place]) 2020   Per POA  . DEPRESSION 10/13/2007  . Diverticular disease   . DIVERTICULOSIS, COLON 05/11/2005  . External hemorrhoids without mention of complication 11/19/760  . FATIGUE 08/07/2008  . HEMORRHOIDS, INTERNAL 05/11/2005  . Hypercalcemia 04/23/2009  . HYPERLIPIDEMIA NEC/NOS 03/01/2007  . HYPERTENSION 10/13/2007  . Impaired fasting glucose 11/14/2008  . INTERSTITIAL CYSTITIS 09/06/2007  . Irritable bowel syndrome 01/06/2009  . LIVER MASS 05/15/2009  . OSTEOPOROSIS 05/09/2009  . PARESTHESIA 09/06/2007  . POLYCYTHEMIA 04/23/2009  . STENOSIS, SPINAL, UNSPC REGION 03/01/2007  . Thyroid  disease     Patient Active Problem List   Diagnosis Date Noted  . Syncope 07/17/2019  . Dementia ([place]) 2020  . Cognitive deficit due to recent stroke 03/08/2017  . Alteration of sensation as late effect of stroke 03/08/2017  . Gait disturbance, post-stroke 03/08/2017  . Acute arterial ischemic stroke, vertebrobasilar, thalamic, left ([place]) 01/10/2017  . Neurologic gait disorder   . Depression   . Benign essential HTN   . History of breast cancer   . Stroke (cerebrum) ([place]) 01/08/2017  . TIA (transient ischemic attack) 12/16/2016  . Dysarthria 12/16/2016  . Leg weakness 12/16/2016  . EKG abnormalities 12/16/2016  . Chronic pain 12/16/2016  . Elevated blood pressure 12/22/2014  . Internal hemorrhoids with bleeding and prolapse 11/04/2014  . Lactose intolerance 11/04/2014  . Hemorrhoid 09/11/2014  . Rectal bleeding 09/11/2014  . IBS (irritable bowel syndrome) 09/25/2013  . Hyperparathyroidism, primary ([place]) 05/26/2011  . Osteoporosis 05/09/2009  . POLYCYTHEMIA 04/23/2009  . IMPAIRED FASTING GLUCOSE 11/14/2008  . BREAST CANCER, HX OF 12/06/2007  . Anxiety state 10/13/2007  . Essential hypertension 10/13/2007  . INTERSTITIAL CYSTITIS 09/06/2007  . HLD (hyperlipidemia) 03/01/2007  . Backache 03/01/2007    Past Surgical History:  Procedure Laterality Date  . ABDOMINAL HYSTERECTOMY    . BACK SURGERY    . BREAST SURGERY     mastectomy -right  . CERVICAL SPINE SURGERY    . CHOLECYSTECTOMY    . COLONOSCOPY W/ BIOPSIES    .  cortisone injection  10/16/12   left knee  . FIXATION KYPHOPLASTY LUMBAR SPINE    . HEMORRHOID BANDING  2015-16  . PARATHYROID EXPLORATION  06/29/2011   Procedure: PARATHYROID EXPLORATION;  Surgeon: Earnstine Regal, MD;  Location: [facility] ORS;  Service: General;  Laterality: Left;  Left Superior Parathyroidectomy  . PARATHYROIDECTOMY  06/29/11     OB History   No obstetric history on file.     Family History  Problem Relation Age of Onset  . Heart disease  Sister   . Hypertension Sister   . Asthma Sister   . Breast cancer Maternal Aunt   . Ovarian cancer Paternal Aunt   . Colon cancer Neg Hx   . Esophageal cancer Neg Hx   . Pancreatic cancer Neg Hx   . Rectal cancer Neg Hx   . Stomach cancer Neg Hx     Social History   Tobacco Use  . Smoking status: Former Smoker    Packs/day: 1.00    Years: 42.00    Pack years: 42.00    Types: Cigarettes    Quit date: 08/23/1977    Years since quitting: 42.9  . Smokeless tobacco: Never Used  Vaping Use  . Vaping Use: Never used  Substance Use Topics  . Alcohol use: No  . Drug use: No    Home Medications Prior to Admission medications   Medication Sig Start Date End Date Taking? Authorizing Provider  celecoxib (CELEBREX) 200 MG capsule Take 1 capsule (200 mg total) by mouth daily as needed (arthritis pain). Patient taking differently: Take 200 mg by mouth daily as needed for mild pain.  01/18/17  Yes Angiulli, Lavon Paganini, PA-C  cholecalciferol (VITAMIN D3) 25 MCG (1000 UNIT) tablet Take 1,000 Units by mouth daily.   Yes [provider]  diazepam (VALIUM) 5 MG tablet Take 1/2 tablet in AM (2.5mg ) and 1 tablet at night (5mg ). Patient taking differently: Take 2.5-5 mg by mouth See admin instructions. 2.5mg  at 9am 5mg  at 9pm 07/23/19  Yes Brimage, Vondra, DO  EPINEPHrine 0.3 mg/0.3 mL IJ SOAJ injection Inject 0.3 mLs (0.3 mg total) into the muscle as needed for anaphylaxis. 10/25/19  Yes Tegeler, Gwenyth Allegra, MD  fluticasone (FLONASE) 50 MCG/ACT nasal spray Place 2 sprays into both nostrils daily. 09/06/14  Yes Hoyt Koch, MD  metoprolol tartrate (LOPRESSOR) 25 MG tablet Take 25 mg by mouth 2 (two) times daily.   Yes [provider]  mirabegron ER (MYRBETRIQ) 50 MG TB24 tablet Take 50 mg by mouth daily.   Yes [provider]  oxyCODONE-acetaminophen (PERCOCET/ROXICET) 5-325 MG tablet Take 1 tablet by mouth every 8 (eight) hours as needed for severe pain. 07/23/19   Yes Brimage, Vondra, DO  PREMARIN 0.3 MG tablet Take 0.3 mg by mouth every other day.  01/30/20  Yes [provider]  WELLBUTRIN XL 300 MG 24 hr tablet Take 300 mg by mouth daily.  01/30/20  Yes [provider]  Cholecalciferol (VITAMIN D3) 1000 units CAPS Take 1 capsule (1,000 Units total) by mouth daily. Patient not taking: Reported on 07/14/2020 01/18/17   Angiulli, Lavon Paganini, PA-C    Allergies    Anectine [succinylcholine chloride], Dilaudid [hydromorphone hcl], Hydrocodone-acetaminophen, Iodine, Naproxen sodium, Oxycodone hcl, Pentosan polysulfate sodium, Pseudoephedrine, Succinylcholine, Tape, Tramadol hcl, and Shellfish allergy  Review of Systems   Review of Systems  Unable to perform ROS: Dementia    Physical Exam Updated Vital Signs BP (!) 195/69   Pulse 84  Temp 97.6 F (36.4 C) (Axillary)   Resp (!) 22   SpO2 93%   Physical Exam Constitutional:      General: She is not in acute distress.    Appearance: She is not ill-appearing or toxic-appearing.  HENT:     Head: Normocephalic and atraumatic.      Mouth/Throat:     Mouth: Mucous membranes are dry.     Pharynx: Oropharynx is clear.  Eyes:     Pupils: Pupils are equal, round, and reactive to light.  Pulmonary:     Effort: Pulmonary effort is normal.     Breath sounds: Normal breath sounds.  Chest:     Breasts:        Right: Absent.   Abdominal:     General: Abdomen is flat. A surgical scar is present. There is no distension.     Palpations: Abdomen is soft. There is no mass.     Tenderness: There is no abdominal tenderness. There is no guarding.     Hernia: No hernia is present.  Musculoskeletal:        General: No swelling, tenderness, deformity or signs of injury.     Right shoulder: Normal.     Left shoulder: No swelling, deformity, tenderness or bony tenderness.     Right upper arm: No swelling, deformity, tenderness or bony tenderness.     Left upper arm: No swelling, deformity,  tenderness or bony tenderness.     Right elbow: No swelling. Normal range of motion. No tenderness.     Left elbow: No swelling. Normal range of motion. No tenderness.     Right forearm: No deformity, tenderness or bony tenderness.     Left forearm: No deformity, tenderness or bony tenderness.     Cervical back: No tenderness.     Right hip: Normal.     Left hip: No deformity, tenderness, bony tenderness or crepitus.     Right upper leg: No deformity, tenderness or bony tenderness.     Left upper leg: No deformity, tenderness or bony tenderness.     Right knee: No swelling, deformity, ecchymosis, bony tenderness or crepitus. Normal range of motion. No tenderness.     Left knee: No swelling, deformity, ecchymosis, bony tenderness or crepitus. Normal range of motion. No tenderness.     Right lower leg: No deformity, tenderness or bony tenderness.     Left lower leg: No deformity, tenderness or bony tenderness.     Comments: Guarding and pain to left shoulder with passive ROM  Guarding and pain to left hip with passive ROM  Skin:    General: Skin is warm and dry.     Findings: Bruising (healing bruising around left eye) present.  Neurological:     General: No focal deficit present.     Mental Status: She is alert.     GCS: GCS eye subscore is 4. GCS verbal subscore is 5. GCS motor subscore is 6.     Comments: Pt is uncooperative with answering questions      ED Results / Procedures / Treatments   Labs (all labs ordered are listed, but only abnormal results are displayed) Labs Reviewed - No data to display  EKG EKG Interpretation  Date/Time:  Monday July 14 2020 10:23:34 EST Ventricular Rate:  83 PR Interval:    QRS Duration: 144 QT Interval:  428 QTC Calculation: 503 R Axis:   60 Text Interpretation: Sinus rhythm Right bundle branch block Since last tracing rate slower  Confirmed by Noemi Chapel [phone]) on 07/14/2020 10:37:55 AM   Radiology No results  found.  Procedures Procedures (including critical care time)  Medications Ordered in ED Medications - No data to display  ED Course  I have reviewed the triage vital signs and the nursing notes.  Pertinent labs & imaging results that were available during my care of the patient were reviewed by me and considered in my medical decision making (see chart for details).    MDM Rules/Calculators/A&P                          86 year old female with a history of hypertension and dementia presents to emergency department for suffering an unwitnessed fall at [facility] care facility.  Upon examination patient is alert but uncooperative with exam.  Patient has no obvious signs of deformity or injury.  Patient complains of pain to left shoulder and left hip on passive range of motion.    EKG, left shoulder, left hip, and head CT were ordered.  CT had showed 1. no acute intracranial abnormality or acute traumatic injury, 2.  Stable noncontrast CT appearance of advanced chronic small vessel disease.  X-ray of pelvis, left hip and left shoulder showed no acute fracture or injury.  KG shows sinus rhythm with right bundle branch block.  Patient blood pressure was observed to be elevated. This could be attributed to patient's agitation upon her arrival and history of hypertension  Patient is alert and vitals are stable.  Patient is stable for discharge back to [facility] facility.    Final Clinical Impression(s) / ED Diagnoses Final diagnoses:  Hip pain    Rx / DC Orders ED Discharge Orders    None       Dyann Ruddle 07/14/20 1424    Noemi Chapel, MD 07/15/20 [phone]

## 2020-07-14 NOTE — ED Triage Notes (Signed)
Pt brought to ED via EMS from [facility] after therapist found patient on floor in her room. Per EMS, pt uses wheelchair at baseline and tried to get up on her own and fell. Pt combative with EMS, stated she will not cooperate unless she is taken to [facility]. No known hx of blood thinner use. No bleeding present, no obvious deformities. Pt currently alert to self, reports pain in head, hypertensive 195/69.   EMS v/s: 196/94 BP 88 HR 92% on room air 16 RR 98.4 tympanic

## 2020-07-14 NOTE — ED Notes (Signed)
[facility] notified of pt needing transport to facility, patient wrapped in warm blankets.

## 2020-12-11 ENCOUNTER — Inpatient Hospital Stay (HOSPITAL_COMMUNITY)
Admission: EM | Admit: 2020-12-11 | Discharge: 2020-12-16 | DRG: 871 | Disposition: A | Discharge status: Skilled Nursing Facility | Payer: Medicare Other | Source: Skilled Nursing Facility | Attending: Family Medicine | Admitting: Family Medicine

## 2020-12-11 ENCOUNTER — Emergency Department (HOSPITAL_COMMUNITY): Payer: Medicare Other

## 2020-12-11 DIAGNOSIS — Z789 Other specified health status: Secondary | ICD-10-CM | POA: Diagnosis not present

## 2020-12-11 DIAGNOSIS — E440 Moderate protein-calorie malnutrition: Secondary | ICD-10-CM | POA: Diagnosis present

## 2020-12-11 DIAGNOSIS — Z888 Allergy status to other drugs, medicaments and biological substances status: Secondary | ICD-10-CM

## 2020-12-11 DIAGNOSIS — G319 Degenerative disease of nervous system, unspecified: Secondary | ICD-10-CM | POA: Diagnosis present

## 2020-12-11 DIAGNOSIS — D649 Anemia, unspecified: Secondary | ICD-10-CM | POA: Diagnosis present

## 2020-12-11 DIAGNOSIS — F3130 Bipolar disorder, current episode depressed, mild or moderate severity, unspecified: Secondary | ICD-10-CM | POA: Diagnosis present

## 2020-12-11 DIAGNOSIS — Z66 Do not resuscitate: Secondary | ICD-10-CM | POA: Diagnosis present

## 2020-12-11 DIAGNOSIS — N1832 Chronic kidney disease, stage 3b: Secondary | ICD-10-CM | POA: Diagnosis present

## 2020-12-11 DIAGNOSIS — N179 Acute kidney failure, unspecified: Secondary | ICD-10-CM

## 2020-12-11 DIAGNOSIS — Z803 Family history of malignant neoplasm of breast: Secondary | ICD-10-CM

## 2020-12-11 DIAGNOSIS — Z7401 Bed confinement status: Secondary | ICD-10-CM

## 2020-12-11 DIAGNOSIS — Z8249 Family history of ischemic heart disease and other diseases of the circulatory system: Secondary | ICD-10-CM

## 2020-12-11 DIAGNOSIS — E860 Dehydration: Secondary | ICD-10-CM

## 2020-12-11 DIAGNOSIS — F411 Generalized anxiety disorder: Secondary | ICD-10-CM | POA: Diagnosis present

## 2020-12-11 DIAGNOSIS — E870 Hyperosmolality and hypernatremia: Secondary | ICD-10-CM | POA: Diagnosis present

## 2020-12-11 DIAGNOSIS — Z9181 History of falling: Secondary | ICD-10-CM

## 2020-12-11 DIAGNOSIS — F0151 Vascular dementia with behavioral disturbance: Secondary | ICD-10-CM | POA: Diagnosis present

## 2020-12-11 DIAGNOSIS — D696 Thrombocytopenia, unspecified: Secondary | ICD-10-CM | POA: Diagnosis present

## 2020-12-11 DIAGNOSIS — R296 Repeated falls: Secondary | ICD-10-CM | POA: Diagnosis present

## 2020-12-11 DIAGNOSIS — Z79899 Other long term (current) drug therapy: Secondary | ICD-10-CM

## 2020-12-11 DIAGNOSIS — N3001 Acute cystitis with hematuria: Secondary | ICD-10-CM | POA: Diagnosis not present

## 2020-12-11 DIAGNOSIS — R4182 Altered mental status, unspecified: Secondary | ICD-10-CM

## 2020-12-11 DIAGNOSIS — Z7189 Other specified counseling: Secondary | ICD-10-CM | POA: Diagnosis not present

## 2020-12-11 DIAGNOSIS — F0390 Unspecified dementia without behavioral disturbance: Secondary | ICD-10-CM | POA: Diagnosis not present

## 2020-12-11 DIAGNOSIS — F0391 Unspecified dementia with behavioral disturbance: Secondary | ICD-10-CM | POA: Diagnosis not present

## 2020-12-11 DIAGNOSIS — I2699 Other pulmonary embolism without acute cor pulmonale: Secondary | ICD-10-CM | POA: Diagnosis present

## 2020-12-11 DIAGNOSIS — Z993 Dependence on wheelchair: Secondary | ICD-10-CM

## 2020-12-11 DIAGNOSIS — Z20822 Contact with and (suspected) exposure to covid-19: Secondary | ICD-10-CM | POA: Diagnosis present

## 2020-12-11 DIAGNOSIS — Z885 Allergy status to narcotic agent status: Secondary | ICD-10-CM

## 2020-12-11 DIAGNOSIS — K921 Melena: Secondary | ICD-10-CM | POA: Diagnosis present

## 2020-12-11 DIAGNOSIS — I69392 Facial weakness following cerebral infarction: Secondary | ICD-10-CM

## 2020-12-11 DIAGNOSIS — I129 Hypertensive chronic kidney disease with stage 1 through stage 4 chronic kidney disease, or unspecified chronic kidney disease: Secondary | ICD-10-CM | POA: Diagnosis present

## 2020-12-11 DIAGNOSIS — I248 Other forms of acute ischemic heart disease: Secondary | ICD-10-CM | POA: Diagnosis present

## 2020-12-11 DIAGNOSIS — E785 Hyperlipidemia, unspecified: Secondary | ICD-10-CM | POA: Diagnosis present

## 2020-12-11 DIAGNOSIS — A419 Sepsis, unspecified organism: Secondary | ICD-10-CM | POA: Diagnosis present

## 2020-12-11 DIAGNOSIS — Z853 Personal history of malignant neoplasm of breast: Secondary | ICD-10-CM

## 2020-12-11 DIAGNOSIS — J9601 Acute respiratory failure with hypoxia: Secondary | ICD-10-CM

## 2020-12-11 DIAGNOSIS — B9689 Other specified bacterial agents as the cause of diseases classified elsewhere: Secondary | ICD-10-CM | POA: Diagnosis present

## 2020-12-11 DIAGNOSIS — Z9011 Acquired absence of right breast and nipple: Secondary | ICD-10-CM

## 2020-12-11 DIAGNOSIS — Z515 Encounter for palliative care: Secondary | ICD-10-CM | POA: Diagnosis not present

## 2020-12-11 DIAGNOSIS — R739 Hyperglycemia, unspecified: Secondary | ICD-10-CM | POA: Diagnosis present

## 2020-12-11 DIAGNOSIS — N390 Urinary tract infection, site not specified: Secondary | ICD-10-CM | POA: Diagnosis present

## 2020-12-11 DIAGNOSIS — Z91013 Allergy to seafood: Secondary | ICD-10-CM

## 2020-12-11 DIAGNOSIS — Z87891 Personal history of nicotine dependence: Secondary | ICD-10-CM

## 2020-12-11 DIAGNOSIS — Z91041 Radiographic dye allergy status: Secondary | ICD-10-CM

## 2020-12-11 DIAGNOSIS — B9620 Unspecified Escherichia coli [E. coli] as the cause of diseases classified elsewhere: Secondary | ICD-10-CM | POA: Diagnosis present

## 2020-12-11 DIAGNOSIS — R339 Retention of urine, unspecified: Secondary | ICD-10-CM | POA: Diagnosis not present

## 2020-12-11 DIAGNOSIS — Z825 Family history of asthma and other chronic lower respiratory diseases: Secondary | ICD-10-CM

## 2020-12-11 DIAGNOSIS — Z8041 Family history of malignant neoplasm of ovary: Secondary | ICD-10-CM

## 2020-12-11 DIAGNOSIS — N3281 Overactive bladder: Secondary | ICD-10-CM | POA: Diagnosis present

## 2020-12-11 DIAGNOSIS — G8929 Other chronic pain: Secondary | ICD-10-CM | POA: Diagnosis present

## 2020-12-11 LAB — RESP PANEL BY RT-PCR (FLU A&B, COVID) ARPGX2
Influenza A by PCR: NEGATIVE
Influenza B by PCR: NEGATIVE
SARS Coronavirus 2 by RT PCR: NEGATIVE

## 2020-12-11 LAB — COMPREHENSIVE METABOLIC PANEL
ALT: 74 U/L — ABNORMAL HIGH (ref 0–44)
AST: 35 U/L (ref 15–41)
Albumin: 2.9 g/dL — ABNORMAL LOW (ref 3.5–5.0)
Alkaline Phosphatase: 207 U/L — ABNORMAL HIGH (ref 38–126)
Anion gap: 12 (ref 5–15)
BUN: 52 mg/dL — ABNORMAL HIGH (ref 8–23)
CO2: 23 mmol/L (ref 22–32)
Calcium: 8.9 mg/dL (ref 8.9–10.3)
Chloride: 111 mmol/L (ref 98–111)
Creatinine, Ser: 1.24 mg/dL — ABNORMAL HIGH (ref 0.44–1.00)
GFR, Estimated: 42 mL/min — ABNORMAL LOW (ref >60)
Glucose, Bld: 241 mg/dL — ABNORMAL HIGH (ref 70–99)
Potassium: 4.7 mmol/L (ref 3.5–5.1)
Sodium: 146 mmol/L — ABNORMAL HIGH (ref 135–145)
Total Bilirubin: 0.8 mg/dL (ref 0.3–1.2)
Total Protein: 6.3 g/dL — ABNORMAL LOW (ref 6.5–8.1)

## 2020-12-11 LAB — CBC WITH DIFFERENTIAL/PLATELET
Abs Immature Granulocytes: 0.16 10*3/uL — ABNORMAL HIGH (ref 0.00–0.07)
Basophils Absolute: 0.1 10*3/uL (ref 0.0–0.1)
Basophils Relative: 0 %
Eosinophils Absolute: 0 10*3/uL (ref 0.0–0.5)
Eosinophils Relative: 0 %
HCT: 51.9 % — ABNORMAL HIGH (ref 36.0–46.0)
Hemoglobin: 16.1 g/dL — ABNORMAL HIGH (ref 12.0–15.0)
Immature Granulocytes: 1 %
Lymphocytes Relative: 12 %
Lymphs Abs: 2.2 10*3/uL (ref 0.7–4.0)
MCH: 31.1 pg (ref 26.0–34.0)
MCHC: 31 g/dL (ref 30.0–36.0)
MCV: 100.4 fL — ABNORMAL HIGH (ref 80.0–100.0)
Monocytes Absolute: 1.2 10*3/uL — ABNORMAL HIGH (ref 0.1–1.0)
Monocytes Relative: 7 %
Neutro Abs: 14.2 10*3/uL — ABNORMAL HIGH (ref 1.7–7.7)
Neutrophils Relative %: 80 %
Platelets: 174 10*3/uL (ref 150–400)
RBC: 5.17 MIL/uL — ABNORMAL HIGH (ref 3.87–5.11)
RDW: 12.4 % (ref 11.5–15.5)
WBC: 17.9 10*3/uL — ABNORMAL HIGH (ref 4.0–10.5)
nRBC: 0 % (ref 0.0–0.2)

## 2020-12-11 LAB — POC OCCULT BLOOD, ED: Fecal Occult Bld: POSITIVE — AB

## 2020-12-11 LAB — URINALYSIS, ROUTINE W REFLEX MICROSCOPIC
Bilirubin Urine: NEGATIVE
Glucose, UA: NEGATIVE mg/dL
Hgb urine dipstick: NEGATIVE
Ketones, ur: NEGATIVE mg/dL
Nitrite: NEGATIVE
Protein, ur: NEGATIVE mg/dL
Specific Gravity, Urine: 1.027 (ref 1.005–1.030)
WBC, UA: >50 WBC/hpf — ABNORMAL HIGH (ref 0–5)
pH: 5 (ref 5.0–8.0)

## 2020-12-11 LAB — LACTIC ACID, PLASMA
Lactic Acid, Venous: 5 mmol/L (ref 0.5–1.9)
Lactic Acid, Venous: 3 mmol/L (ref 0.5–1.9)

## 2020-12-11 LAB — TROPONIN I (HIGH SENSITIVITY)
Troponin I (High Sensitivity): 277 ng/L (ref <18)
Troponin I (High Sensitivity): 304 ng/L (ref <18)
Troponin I (High Sensitivity): 241 ng/L (ref <18)

## 2020-12-11 LAB — PROTIME-INR
INR: 1.2 (ref 0.8–1.2)
Prothrombin Time: 14.9 seconds (ref 11.4–15.2)

## 2020-12-11 LAB — APTT: aPTT: 27 seconds (ref 24–36)

## 2020-12-11 MED ORDER — SODIUM CHLORIDE 0.9 % IV BOLUS: 1000.0000 mL | Freq: Once | INTRAVENOUS | Status: DC

## 2020-12-11 MED ORDER — LACTATED RINGERS IV BOLUS: 500.0000 mL | Freq: Once | INTRAVENOUS | Status: DC

## 2020-12-11 MED ORDER — DIPHENHYDRAMINE HCL 50 MG/ML IJ SOLN
50.0000 mg | Freq: Once | INTRAMUSCULAR | Status: AC
  Administered 2020-12-12: 50 mg via INTRAVENOUS
  Filled 2020-12-11: qty 1

## 2020-12-11 MED ORDER — LACTATED RINGERS IV SOLN: INTRAVENOUS | Status: DC

## 2020-12-11 MED ORDER — SODIUM CHLORIDE 0.9 % IV SOLN
500.0000 mg | Freq: Once | INTRAVENOUS | Status: AC
  Administered 2020-12-11: 500 mg via INTRAVENOUS
  Filled 2020-12-11: qty 500

## 2020-12-11 MED ORDER — SODIUM CHLORIDE 0.9 % IV SOLN: 1.0000 g | Freq: Once | INTRAVENOUS | Status: DC

## 2020-12-11 MED ORDER — LACTATED RINGERS IV BOLUS
1000.0000 mL | Freq: Once | INTRAVENOUS | Status: AC
  Administered 2020-12-11: 1000 mL via INTRAVENOUS

## 2020-12-11 MED ORDER — HYDROCORTISONE NA SUCCINATE PF 250 MG IJ SOLR
200.0000 mg | Freq: Once | INTRAMUSCULAR | Status: AC
  Administered 2020-12-12: 200 mg via INTRAVENOUS
  Filled 2020-12-11: qty 200

## 2020-12-11 MED ORDER — SODIUM CHLORIDE 0.9 % IV SOLN: INTRAVENOUS | Status: DC

## 2020-12-11 MED ORDER — SODIUM CHLORIDE 0.9 % IV SOLN
1.0000 g | Freq: Once | INTRAVENOUS | Status: AC
  Administered 2020-12-11: 1 g via INTRAVENOUS
  Filled 2020-12-11: qty 10

## 2020-12-11 MED ORDER — SODIUM CHLORIDE 0.9 % IV SOLN: 2.0000 g | Freq: Once | INTRAVENOUS | Status: DC

## 2020-12-11 MED ORDER — PANTOPRAZOLE SODIUM 40 MG IV SOLR: 40.0000 mg | Freq: Once | INTRAVENOUS | Status: DC

## 2020-12-11 NOTE — ED Provider Notes (Signed)
Medical screening examination/treatment/procedure(s) were conducted as a shared visit with non-physician practitioner(s) and myself.  I personally evaluated the patient during the encounter.  Clinical Impression:   Final diagnoses:  None    This patient is an ill-appearing 87 year old female presenting from nursing facility where she has not been eating or drinking for about 7 days, the caregiver at the bedside says she is usually demented with behavioral disturbance but has been even worse over the last 24 hours and is now very combative, tachypneic and on my exam appears dry in the mouth, tachypneic and tachycardic to 130.  I suspect that the patient is septic from some underlying source, code sepsis, antibiotics, IV fluids, the patient does have DNR paperwork with her, acceptable to use antibiotics and fluids  Ill and will need to have antibiotics and fluids and admission to the hospital to high level of care for stabilizing treatment.  Critically ill  CRITICAL CARE Performed by: Johnna Acosta Total critical care time: 35 minutes Critical care time was exclusive of separately billable procedures and treating other patients. Critical care was necessary to treat or prevent imminent or life-threatening deterioration. Critical care was time spent personally by me on the following activities: development of treatment plan with patient and/or surrogate as well as nursing, discussions with consultants, evaluation of patient's response to treatment, examination of patient, obtaining history from patient or surrogate, ordering and performing treatments and interventions, ordering and review of laboratory studies, ordering and review of radiographic studies, pulse oximetry and re-evaluation of patient's condition.     Noemi Chapel, MD 12/12/20 [phone]

## 2020-12-11 NOTE — Progress Notes (Signed)
Received patient from ED accompanied by family members. Patient awake,alert/ oriented to self and place at this time. Respirations even on nasal cannula. Patient connected to continuous pulse ox and cardiac monitoring per orders. Movement/sensation noted to all extremities. Whiteboard updated. Oriented to room and floor. All safety measures in place and personal belongings within reach.

## 2020-12-11 NOTE — Progress Notes (Signed)
FPTS Interim Progress Note  S: Went to bedside to check on patient.  She is conversant, expressed that she is scared that she will fall.  Reassurance provided and instructed patient on how to use the call bell if needed.  O: BP 113/67 (BP Location: Left Arm)   Pulse (!) 115   Temp (!) 97.5 F (36.4 C) (Oral)   Resp 17   Ht 5\' 2"  (1.575 m)   Wt 54.4 kg   SpO2 99%   BMI 21.95 kg/m   General: Elderly female resting in bed comfortably CV: Tachycardic, regular rhythm, no murmurs Respiratory: Bibasilar rales, breathing comfortably on nasal cannula Neuro: Awake, alert, interactive  HR mostly in the 110s on monitor Patient was on 6 L nasal cannula, turned oxygen down to 5 L and maintain good SpO2 98%  A/P: Respiratory status is improved, previously was on 15 L BVM.  CTPA and CT head still to be done at this time, she will need to be premedicated given iodine allergy.  Will change IV fluids from NS to LR given mild hypernatremia.  Fluids had not yet been hung but RN preparing IV fluids at this time.  Zola Button, MD 12/11/2020, 10:09 PM PGY-1, [place] [facility] Service pager [phone]

## 2020-12-11 NOTE — ED Provider Notes (Signed)
[place] EMERGENCY DEPARTMENT Provider Note   CSN: [id number] Arrival date & time: 12/11/20  1126     History Chief Complaint  Patient presents with  . Altered Mental Status    CHIANN [name] is a 87 y.o. female.  HPI Patient is a 87 year old female with a history of dementia, hypertension, CVA, who presents emergency department by EMS due to altered mental status.  Her caregiver is at bedside.  Patient resides at [facility].  She states that patient has had decreased p.o. intake for the past 1 week which is abnormal for her.  She tells me "the patient just does not want to eat anything and has not been fighting".  Over the past 2 days she has been declining rapidly and is now increasingly altered and having difficulty following commands.  Patient speaks in unintelligible whispers.  EMS was told by staff the patient recently had blood in her stool.  They state at baseline she is normally able to respond and have a conversation.  Patient is tachycardic with a heart rate in the mid 130s.  Oxygen saturations in the high 80s and patient placed on a nonrebreather at 15 L.  BP in the field is 130/70.  CBG of 323.   Patient's caregiver is now bedside.  She confirms the above history.  She states yesterday she began declining rapidly.  Also notes increased work of breathing yesterday.  She states the patient is typically combative and for patient to not be combative in the emergency department is very unlike her.  Patient is DNR.  Level 5 caveat due to altered mental status.    Past Medical History:  Diagnosis Date  . Adenomatous colon polyp 04/2005  . Allergic    Anectine  . ANXIETY 10/13/2007  . BACK PAIN 03/01/2007  . BREAST CANCER, HX OF 12/06/2007  . Cancer [facility])    RIGHT mastectomy with node dissection  . Complication of anesthesia    family aggergy to annectine  . Compression fracture of L4 lumbar vertebra 02/2013  . CONSTIPATION 01/06/2009  . Dementia  ([place]) 2020   Per POA  . DEPRESSION 10/13/2007  . Diverticular disease   . DIVERTICULOSIS, COLON 05/11/2005  . External hemorrhoids without mention of complication 9/52/8413  . FATIGUE 08/07/2008  . HEMORRHOIDS, INTERNAL 05/11/2005  . Hypercalcemia 04/23/2009  . HYPERLIPIDEMIA NEC/NOS 03/01/2007  . HYPERTENSION 10/13/2007  . Impaired fasting glucose 11/14/2008  . INTERSTITIAL CYSTITIS 09/06/2007  . Irritable bowel syndrome 01/06/2009  . LIVER MASS 05/15/2009  . OSTEOPOROSIS 05/09/2009  . PARESTHESIA 09/06/2007  . POLYCYTHEMIA 04/23/2009  . STENOSIS, SPINAL, UNSPC REGION 03/01/2007  . Thyroid disease     Patient Active Problem List   Diagnosis Date Noted  . Syncope 07/17/2019  . Dementia ([place]) 2020  . Cognitive deficit due to recent stroke 03/08/2017  . Alteration of sensation as late effect of stroke 03/08/2017  . Gait disturbance, post-stroke 03/08/2017  . Acute arterial ischemic stroke, vertebrobasilar, thalamic, left ([place]) 01/10/2017  . Neurologic gait disorder   . Depression   . Benign essential HTN   . History of breast cancer   . Stroke (cerebrum) ([place]) 01/08/2017  . TIA (transient ischemic attack) 12/16/2016  . Dysarthria 12/16/2016  . Leg weakness 12/16/2016  . EKG abnormalities 12/16/2016  . Chronic pain 12/16/2016  . Elevated blood pressure 12/22/2014  . Internal hemorrhoids with bleeding and prolapse 11/04/2014  . Lactose intolerance 11/04/2014  . Hemorrhoid 09/11/2014  . Rectal bleeding 09/11/2014  .  IBS (irritable bowel syndrome) 09/25/2013  . Hyperparathyroidism, primary ([place]) 05/26/2011  . Osteoporosis 05/09/2009  . POLYCYTHEMIA 04/23/2009  . IMPAIRED FASTING GLUCOSE 11/14/2008  . BREAST CANCER, HX OF 12/06/2007  . Anxiety state 10/13/2007  . Essential hypertension 10/13/2007  . INTERSTITIAL CYSTITIS 09/06/2007  . HLD (hyperlipidemia) 03/01/2007  . Backache 03/01/2007    Past Surgical History:  Procedure Laterality Date  . ABDOMINAL HYSTERECTOMY    . BACK  SURGERY    . BREAST SURGERY     mastectomy -right  . CERVICAL SPINE SURGERY    . CHOLECYSTECTOMY    . COLONOSCOPY W/ BIOPSIES    . cortisone injection  10/16/12   left knee  . FIXATION KYPHOPLASTY LUMBAR SPINE    . HEMORRHOID BANDING  2015-16  . PARATHYROID EXPLORATION  06/29/2011   Procedure: PARATHYROID EXPLORATION;  Surgeon: Earnstine Regal, MD;  Location: [facility] ORS;  Service: General;  Laterality: Left;  Left Superior Parathyroidectomy  . PARATHYROIDECTOMY  06/29/11     OB History   No obstetric history on file.     Family History  Problem Relation Age of Onset  . Heart disease Sister   . Hypertension Sister   . Asthma Sister   . Breast cancer Maternal Aunt   . Ovarian cancer Paternal Aunt   . Colon cancer Neg Hx   . Esophageal cancer Neg Hx   . Pancreatic cancer Neg Hx   . Rectal cancer Neg Hx   . Stomach cancer Neg Hx     Social History   Tobacco Use  . Smoking status: Former Smoker    Packs/day: 1.00    Years: 42.00    Pack years: 42.00    Types: Cigarettes    Quit date: 08/23/1977    Years since quitting: 43.3  . Smokeless tobacco: Never Used  Vaping Use  . Vaping Use: Never used  Substance Use Topics  . Alcohol use: No  . Drug use: No    Home Medications Prior to Admission medications   Medication Sig Start Date End Date Taking? Authorizing Provider  celecoxib (CELEBREX) 200 MG capsule Take 1 capsule (200 mg total) by mouth daily as needed (arthritis pain). Patient taking differently: Take 200 mg by mouth daily as needed for mild pain.  01/18/17   Angiulli, Lavon Paganini, PA-C  Cholecalciferol (VITAMIN D3) 1000 units CAPS Take 1 capsule (1,000 Units total) by mouth daily. Patient not taking: Reported on 07/14/2020 01/18/17   Angiulli, Lavon Paganini, PA-C  cholecalciferol (VITAMIN D3) 25 MCG (1000 UNIT) tablet Take 1,000 Units by mouth daily.    [provider]  diazepam (VALIUM) 5 MG tablet Take 1/2 tablet in AM (2.53m) and 1 tablet at night (5437m. Patient  taking differently: Take 2.5-5 mg by mouth See admin instructions. 2.37m22mt 9am 37mg68m 9pm 07/23/19   Brimage, Vondra, DO  EPINEPHrine 0.3 mg/0.3 mL IJ SOAJ injection Inject 0.3 mLs (0.3 mg total) into the muscle as needed for anaphylaxis. 10/25/19   Tegeler, ChriGwenyth Allegra  fluticasone (FLONASE) 50 MCG/ACT nasal spray Place 2 sprays into both nostrils daily. 09/06/14   CrawHoyt Koch  metoprolol tartrate (LOPRESSOR) 25 MG tablet Take 25 mg by mouth 2 (two) times daily.    [provider]  mirabegron ER (MYRBETRIQ) 50 MG TB24 tablet Take 50 mg by mouth daily.    [provider]  oxyCODONE-acetaminophen (PERCOCET/ROXICET) 5-325 MG tablet Take 1 tablet by mouth every 8 (eight) hours as needed for severe pain.  07/23/19   Brimage, Vondra, DO  PREMARIN 0.3 MG tablet Take 0.3 mg by mouth every other day.  01/30/20   [provider]  WELLBUTRIN XL 300 MG 24 hr tablet Take 300 mg by mouth daily.  01/30/20   [provider]    Allergies    Anectine [succinylcholine chloride], Dilaudid [hydromorphone hcl], Hydrocodone-acetaminophen, Iodine, Naproxen sodium, Oxycodone hcl, Pentosan polysulfate sodium, Pseudoephedrine, Succinylcholine, Tape, Tramadol hcl, and Shellfish allergy  Review of Systems   Review of Systems  All other systems reviewed and are negative. Ten systems reviewed and are negative for acute change, except as noted in the HPI.   Physical Exam Updated Vital Signs BP 116/69   Pulse (!) 113   Temp 100 F (37.8 C) (Rectal)   Resp (!) 25   Ht 5' 2"  (1.575 m)   Wt 54.4 kg   SpO2 100%   BMI 21.95 kg/m   Physical Exam Vitals and nursing note reviewed.  Constitutional:      General: She is in acute distress.     Appearance: She is ill-appearing. She is not toxic-appearing or diaphoretic.  HENT:     Head: Normocephalic and atraumatic.     Right Ear: External ear normal.     Left Ear: External ear normal.     Nose: Nose normal.      Mouth/Throat:     Mouth: Mucous membranes are moist.     Pharynx: Oropharynx is clear. No oropharyngeal exudate or posterior oropharyngeal erythema.  Eyes:     Extraocular Movements: Extraocular movements intact.  Cardiovascular:     Rate and Rhythm: Regular rhythm. Tachycardia present.     Pulses: Normal pulses.     Heart sounds: Normal heart sounds. No murmur heard. No friction rub. No gallop.   Pulmonary:     Effort: Respiratory distress present.     Breath sounds: No stridor. Rales present. No wheezing or rhonchi.     Comments: Tachypneic.  Rales noted in the bilateral lung bases.  On a nonrebreather at 15 L. Abdominal:     General: Abdomen is flat.     Palpations: Abdomen is soft.     Tenderness: There is no abdominal tenderness.  Genitourinary:    Comments: Female nursing chaperone present.  Normal-appearing anal region.  Large amount of brown stool noted in the rectal vault.  No hematochezia.  No melena. Musculoskeletal:        General: Normal range of motion.     Cervical back: Normal range of motion and neck supple. No tenderness.  Skin:    General: Skin is warm and dry.  Neurological:     Comments: Speaks in unintelligible whispers.  Demented at baseline.  Will intermittently follow commands.    ED Results / Procedures / Treatments   Labs (all labs ordered are listed, but only abnormal results are displayed) Labs Reviewed  LACTIC ACID, PLASMA - Abnormal; Notable for the following components:      Result Value   Lactic Acid, Venous 5.0 (*)    All other components within normal limits  COMPREHENSIVE METABOLIC PANEL - Abnormal; Notable for the following components:   Sodium 146 (*)    Glucose, Bld 241 (*)    BUN 52 (*)    Creatinine, Ser 1.24 (*)    Total Protein 6.3 (*)    Albumin 2.9 (*)    ALT 74 (*)    Alkaline Phosphatase 207 (*)    GFR, Estimated 42 (*)  All other components within normal limits  CBC WITH DIFFERENTIAL/PLATELET - Abnormal; Notable for the  following components:   WBC 17.9 (*)    RBC 5.17 (*)    Hemoglobin 16.1 (*)    HCT 51.9 (*)    MCV 100.4 (*)    Neutro Abs 14.2 (*)    Monocytes Absolute 1.2 (*)    Abs Immature Granulocytes 0.16 (*)    All other components within normal limits  URINALYSIS, ROUTINE W REFLEX MICROSCOPIC - Abnormal; Notable for the following components:   Color, Urine AMBER (*)    APPearance HAZY (*)    Leukocytes,Ua LARGE (*)    WBC, UA >50 (*)    Bacteria, UA RARE (*)    All other components within normal limits  POC OCCULT BLOOD, ED - Abnormal; Notable for the following components:   Fecal Occult Bld POSITIVE (*)    All other components within normal limits  TROPONIN I (HIGH SENSITIVITY) - Abnormal; Notable for the following components:   Troponin I (High Sensitivity) 277 (*)    All other components within normal limits  RESP PANEL BY RT-PCR (FLU A&B, COVID) ARPGX2  CULTURE, BLOOD (SINGLE)  URINE CULTURE  CULTURE, BLOOD (ROUTINE X 2)  CULTURE, BLOOD (ROUTINE X 2)  PROTIME-INR  APTT  LACTIC ACID, PLASMA  TROPONIN I (HIGH SENSITIVITY)    EKG EKG Interpretation  Date/Time:  Thursday December 11 2020 11:34:13 EDT Ventricular Rate:  133 PR Interval:  123 QRS Duration: 134 QT Interval:  335 QTC Calculation: 499 R Axis:   111 Text Interpretation: Sinus tachycardia RBBB and LPFB Borderline ST depression, lateral leads Since last tracing rate faster Confirmed by Noemi Chapel [phone]) on 12/11/2020 11:46:31 AM   Radiology DG Chest Port 1 View  Result Date: 12/11/2020 CLINICAL DATA:  Sepsis. EXAM: PORTABLE CHEST 1 VIEW COMPARISON:  May 04, 2020. FINDINGS: Stable cardiomediastinal silhouette. No pneumothorax or pleural effusion is noted. Lungs are clear. Multiple old right rib fractures are noted. IMPRESSION: No acute cardiopulmonary abnormality seen. Aortic Atherosclerosis (ICD10-I70.0). Electronically Signed   By: Marijo Conception M.D.   On: 12/11/2020 12:05    Procedures Procedures    Medications Ordered in ED Medications  lactated ringers bolus 1,000 mL (has no administration in time range)  azithromycin (ZITHROMAX) 500 mg in sodium chloride 0.9 % 250 mL IVPB (has no administration in time range)  lactated ringers bolus 500 mL (has no administration in time range)  cefTRIAXone (ROCEPHIN) 1 g in sodium chloride 0.9 % 100 mL IVPB (1 g Intravenous New Bag/Given 12/11/20 1214)    ED Course  I have reviewed the triage vital signs and the nursing notes.  Pertinent labs & imaging results that were available during my care of the patient were reviewed by me and considered in my medical decision making (see chart for details).  Clinical Course as of 12/11/20 1353  Thu Dec 11, 2020  1211 Temp: 100 F (37.8 C) rectal [LJ]  1323 Leukocytes,Ua(!): LARGE [LJ]  1323 WBC, UA(!): >50 [LJ]  1323 Bacteria, UA(!): RARE [LJ]    Clinical Course User Index [LJ] Rayna Sexton, PA-C   MDM Rules/Calculators/A&P                          Pt is a 87 y.o. female who presents the emergency department due to altered mental status as well as what appears to be sepsis.  Labs: CBC with leukocytosis of 17.9, MCV of 100.4,  neutrophils of 14.2, monocytes of 1.2. CMP with a sodium of 146, glucose of 241, creatinine of 1.24, GFR 42, alk phos of 207, albumin of 2.9. Troponin of 277. UA showing large leukocytes, greater than 50 white blood cells, rare bacteria. PT/INR within normal limits. APTT within normal limits. Respiratory panel negative. Fecal occult blood is positive.  No melena or gross hematochezia. Lactic acid of 5.0.  Patient fluid bolused with 1.5 L of lactated Ringer's.  Imaging: Chest x-ray is negative.  I, Rayna Sexton, PA-C, personally reviewed and evaluated these images and lab results as part of my medical decision-making.  Initially given patient's respiratory distress and need for oxygen felt that her sepsis was secondary to a respiratory source.  She was started  on Rocephin as well as azithromycin.  Chest x-ray resulted and is now negative.  Respiratory panel is negative.  UA concerning for possible urosepsis.  Rocephin should provide adequate coverage.  Patient fluid bolused and her BP is within normal limits.  Mildly elevated temperature at 100 F.  Continues to be tachycardic, though this has improved mildly to about 113 bpm.  Patient is a DNR and her caregiver is at bedside who helps provide most of her history.  We will admit to the medicine team for further management and evaluation.  Note: Portions of this report may have been transcribed using voice recognition software. Every effort was made to ensure accuracy; however, inadvertent computerized transcription errors may be present.   Final Clinical Impression(s) / ED Diagnoses Final diagnoses:  Altered mental status, unspecified altered mental status type  Sepsis, due to unspecified organism, unspecified whether acute organ dysfunction present [facility])  Acute cystitis with hematuria    Rx / DC Orders ED Discharge Orders    None       Rayna Sexton, PA-C 12/11/20 1353    Noemi Chapel, MD 12/12/20 [phone]

## 2020-12-11 NOTE — ED Triage Notes (Signed)
Pt here [place] with reports of increased ams over the last 2 days. Pt recently with blood in stool. Pt normally able to respond and have a conversation. Pt now only grunting. Generalized weakness noted.  HR 130 89% RA, with accessory muscle use with breathing, placed on NRB at 15L 130/70 CBG 323

## 2020-12-11 NOTE — Progress Notes (Signed)
Mds at bedside rounding on patient. Updated on yellow MEWs and stated q4hour vitals to be taken. New orders for fluids placed and started at this time. Patient awake,alert in bed at this time. Family remains at bedside.    12/11/20 1955  Assess: MEWS Score  Temp (!) 97.5 F (36.4 C)  BP 113/67  Pulse Rate (!) 115  Resp 17  Level of Consciousness Alert  SpO2 99 %  O2 Device Nasal Cannula  Treat  Pain Scale PAINAD  Pain Score 2  Pain Type Acute pain;Chronic pain  Pain Location Buttocks  Pain Descriptors / Indicators Aching;Discomfort  Pain Frequency Intermittent  Pain Onset Gradual  Pain Intervention(s) Rest  Multiple Pain Sites No  Breathing 0  Negative Vocalization 0  Facial Expression 0  Body Language 0  Consolability 0  PAINAD Score 0  Escalate  MEWS: Escalate Yellow: discuss with charge nurse/RN and consider discussing with provider and RRT  Notify: Charge Nurse/RN  Name of Charge Nurse/RN Notified Joyce, RN  Date Charge Nurse/RN Notified 12/11/20  Time Charge Nurse/RN Notified 2158  Notify: Provider  Provider Name/Title Nancy Fetter, MD  Date Provider Notified 12/11/20  Time Provider Notified 2158  Notification Type Face-to-face  Notification Reason Other (Comment) (yellow MEWs)  Provider response At bedside  Date of Provider Response 12/11/20  Time of Provider Response 2158  Document  Patient Outcome Other (Comment) (pt stable at this time.)

## 2020-12-11 NOTE — ED Notes (Signed)
Attempted to give reportx1 

## 2020-12-11 NOTE — Progress Notes (Signed)
CT notified this RN that patient has iodine allergy and will need to be premedicated prior to scan. Paging system down at this time to page team for orders. Will recheck in 30 minutes.

## 2020-12-11 NOTE — H&P (Addendum)
[facility] Admission History and Physical Service Pager: [phone]  Patient name: [name] Medical record number: [id number] Date of birth: 07/18/1930 Age: 87 y.o. Gender: female  Primary Care Provider: Patient, No Pcp Per (Inactive) Consultants: None Code Status:DNR Preferred Emergency Contact: Doristine Section, [phone]  [facility] Extended Emergency Contact Information Primary Emergency Contact: Norton Phone: [phone] Mobile Phone: [phone] Relation: Friend  - second caregiver   Chief Complaint: AMS  Assessment and Plan: [name] is a 87 y.o. female presenting with AMS . PMH is significant for dementia, CVA, HTN, depression and bipolar disorder.   AMS  Sepsis 2/2 to UTI  History of dementia Patient presented with AMS. Upon presentation to ED requiring 15L with bag valve mask, tachycardic to 132 beats per minute with respiratory rate as high as 43. Labs notable for leukocytosis of 17.9 as well as LA of 5 down trended to 3,hgb of 16 (hx of polycythemia vs acute hemoconcentration). UA positive for large leukocytes and rare bacteria without nitrites or hemoglobin, 6-10 RBCs and >50 WBCs. CXR demonstrated no acute cardiopulmonary abnormality. In the ED, given LR 1500 bolus along with ceftriaxone and azithromycin for initial diagnosis of PNA in setting of hypoxia. Cause of altered mental status multifactorial from urosepsis in the setting of dementia. Given rapid decline, consistent with vascular dementia given history of CVA. Possibly drug adverse interactions as patient is on anticholinergic for overactive bladder. Another differential possibly another acute CVA although no focal findings on exam other than residual deficits from prior acute infarct. Patient does not exhibit significant electrolyte abnormalities as one would expect to cause this degree of mental status changes. Very low concern for seizure as this is  inconsistent with history and seems incongruent with patient's presenting symptoms. Will acquire head imaging as patient had reported recent fall 4 days ago. Admit for observation and to monitor mental status.  -Admit to med tele, attending Dr. Andria Frames  -continue ceftriaxone for UTI dosing  - no additional Azithro given CXR findings  - s/p LR 1.5 L bolus - will give additional NS 1L bolus prior to CT imaging -100 mL/hr NS maintenance -f/u CT head w/o contrast -up with assistance -delirium precautions  -neuro checks qh4 -PT/OT eval and treat  - GOC discussion with POA, Tena, given age, DNR status to guide evaluation for potential GI bleed   Acute hypoxic respiratory failure  Patient does not require any oxygen at baseline. Concern for possible PE given presenting symptoms of agonal breathing and tachycardia. Remains on 15L NRB, saturations 100% -f/u CTA chest -continuous pulse ox  -wean O2 as tolerated    Hematochezia  History of melena since December 2021. Per chart review, colonoscopy performed in 2016 notable for 6 mm sessile polyp that was removed along with external and internal hemorrhoids. -IV protonix 40 mg  -f/u PT/INR, PTT -consider GI consult in am depending of goals of care   AKI in the setting of CKD stage 3b Cr 1.24 and GFR 42, baseline appears to be around 1. Likely prerenal in the setting of poor oral intake leading to dehydration. Received 158mL bolus of LR in ED.  -fluids as above  - avoid nephrotoxic agents  - monitor Cr daily   Leukocytosis WBC 17.9, likely reactive but possibly due to hemoconcentration in her dehydrated state vs hx of polycythemia. CXR demonstrated no acute cardiopulmonary abnormality. UA notable for large leukocytes and >50 WBC with rare bacteria. Thus far afebrile. - pending blood cultures -  pending urine cultures  - continue to monitor clinically - vitals per routine   Irritated Gluteal Cleft  Patient's daughter reports increased  erythema near gluteal cleft. Patient has been bed bound and mobile with wheelchair.    Elevated hemoglobin On admission Hgb 16.1, MCV 100.4 and hematocrit 51.9. Per chart review, patient has history of polycythemia but also possible hemoconcentration could be cause.  -am CBC -f/u folate -f/u B12  Elevated transaminases AST 35 wnl, ALT 74 and alkaline phosphatase 207. -am CMP  Elevated Troponin  Initial troponin elevated at 277, likely secondary to demand ischemia.  - trend troponin - am EKG - monitor clinically, low concern for cardiac etiology   Hypernatremia Na 146, likely in the setting of dehydration. - fluids as above - am CMP  Hyperglycemia Blood glucose 241 on admission, no known history of diabetes.  -monitor CMP   History of CVA Unknown of when CVA took place. Most recent CT head on 07/14/2021 demonstrated no acute intracranial abnormality or acute traumatic injury identified, stable noncontrast CT appearance of advanced chronic small vessel disease. No home medications noted. -CT head w/o contrast after recent fall  - PT/OT evaluation  - neuro checks   Hypertension Normotensive on admission, BP 128/74. Known history of hypertension, home meds include metoprolol 25 mg. -BP at goal now, hold home med   Bipolar disorder  Depression Home meds include olanzapine 2.5 mg, bupropion 150 mg and setraline 25 mg. -hold home meds, patient NPO -restart home meds after med rec complete and after SLP eval  Protein calorie malnutrition Albumin 2.9 and total protein 6.3 on admission, secondary to inadequate oral intake. -NPO pending SLP eval -encourage diet when able -nutrition consult placed    FEN/GI: NPO pending SLP eval  Prophylaxis: SCDs  Disposition: med-tele; patient [facility]     History of Present Illness:  [name] is a 87 y.o. female presenting with AMS.  Patient reports that she is not sure why she is in the hospital. She  states that the year is 62 but is aware of being in [place].   Patient's caregiver, Amy, present at bedside and  has been visiting for last two weeks nearly everyday. She has had little PO intake and has not wanted to shower. She reports patient had labored breathing and has been yelling and screaming. This morning, she had no energy and was not opening her eyes. The RN reported blood inpatient's stool. Patient was not opening her eyes nor having conversation. Baseline is normal conversation. Patient is now wheelchair bound. Was last normal 2 weeks ago. Patient reported to caregiver that she has been having leg pain (new today) and has chronic back pain.   Patient does not have oxygen at baseline. She is normally eating a normal diet. Noticed yesterday that she would have SOB with trying to drink. Patient reported to have BL facial droop from prior stroke at baseline.    Home Medications reviewed ([place]) Metoprolol 25mg   Olanzapine 2.5 Vitamin D3 Celecoxib 200mg  Divalproex 125 myrbetriq Bupropion 150 setraline 25 Diazepam  5 Oxycodone-tylnenol  5/325   ED Course  Normal CXR and covid negative. Given 2 boluses of LR and treated with abx Azithro and CTX. Required bag valve mask 15L initially upon arrival and improved to 99% saturations on 15L NRB. Patient was also tachycardic with tachypnea to max of 42 breaths per minute.   Review Of Systems: Per HPI with the following additions:   Review of Systems  Constitutional: Positive for activity change, appetite change and fatigue.  Respiratory: Positive for shortness of breath.        Labored breathing  Gastrointestinal: Positive for blood in stool.  Genitourinary: Negative for dysuria and hematuria.  Musculoskeletal: Positive for back pain.  Neurological: Positive for facial asymmetry. Negative for dizziness, seizures and headaches.     Patient Active Problem List   Diagnosis Date Noted  . Altered mental status 12/11/2020  .  Syncope 07/17/2019  . Dementia ([place]) 2020  . Cognitive deficit due to recent stroke 03/08/2017  . Alteration of sensation as late effect of stroke 03/08/2017  . Gait disturbance, post-stroke 03/08/2017  . Acute arterial ischemic stroke, vertebrobasilar, thalamic, left ([place]) 01/10/2017  . Neurologic gait disorder   . Depression   . Benign essential HTN   . History of breast cancer   . Stroke (cerebrum) ([place]) 01/08/2017  . TIA (transient ischemic attack) 12/16/2016  . Dysarthria 12/16/2016  . Leg weakness 12/16/2016  . EKG abnormalities 12/16/2016  . Chronic pain 12/16/2016  . Elevated blood pressure 12/22/2014  . Internal hemorrhoids with bleeding and prolapse 11/04/2014  . Lactose intolerance 11/04/2014  . Hemorrhoid 09/11/2014  . Rectal bleeding 09/11/2014  . IBS (irritable bowel syndrome) 09/25/2013  . Hyperparathyroidism, primary ([place]) 05/26/2011  . Osteoporosis 05/09/2009  . POLYCYTHEMIA 04/23/2009  . IMPAIRED FASTING GLUCOSE 11/14/2008  . BREAST CANCER, HX OF 12/06/2007  . Anxiety state 10/13/2007  . Essential hypertension 10/13/2007  . INTERSTITIAL CYSTITIS 09/06/2007  . HLD (hyperlipidemia) 03/01/2007  . Backache 03/01/2007    Past Medical History: Past Medical History:  Diagnosis Date  . Adenomatous colon polyp 04/2005  . Allergic    Anectine  . ANXIETY 10/13/2007  . BACK PAIN 03/01/2007  . BREAST CANCER, HX OF 12/06/2007  . Cancer [facility])    RIGHT mastectomy with node dissection  . Complication of anesthesia    family aggergy to annectine  . Compression fracture of L4 lumbar vertebra 02/2013  . CONSTIPATION 01/06/2009  . Dementia ([place]) 2020   Per POA  . DEPRESSION 10/13/2007  . Diverticular disease   . DIVERTICULOSIS, COLON 05/11/2005  . External hemorrhoids without mention of complication 11/29/8117  . FATIGUE 08/07/2008  . HEMORRHOIDS, INTERNAL 05/11/2005  . Hypercalcemia 04/23/2009  . HYPERLIPIDEMIA NEC/NOS 03/01/2007  . HYPERTENSION 10/13/2007  . Impaired  fasting glucose 11/14/2008  . INTERSTITIAL CYSTITIS 09/06/2007  . Irritable bowel syndrome 01/06/2009  . LIVER MASS 05/15/2009  . OSTEOPOROSIS 05/09/2009  . PARESTHESIA 09/06/2007  . POLYCYTHEMIA 04/23/2009  . STENOSIS, SPINAL, UNSPC REGION 03/01/2007  . Thyroid disease     Past Surgical History: Past Surgical History:  Procedure Laterality Date  . ABDOMINAL HYSTERECTOMY    . BACK SURGERY    . BREAST SURGERY     mastectomy -right  . CERVICAL SPINE SURGERY    . CHOLECYSTECTOMY    . COLONOSCOPY W/ BIOPSIES    . cortisone injection  10/16/12   left knee  . FIXATION KYPHOPLASTY LUMBAR SPINE    . HEMORRHOID BANDING  2015-16  . PARATHYROID EXPLORATION  06/29/2011   Procedure: PARATHYROID EXPLORATION;  Surgeon: Earnstine Regal, MD;  Location: [facility] ORS;  Service: General;  Laterality: Left;  Left Superior Parathyroidectomy  . PARATHYROIDECTOMY  06/29/11    Social History: Social History   Tobacco Use  . Smoking status: Former Smoker    Packs/day: 1.00    Years: 42.00    Pack years: 42.00    Types: Cigarettes    Quit  date: 08/23/1977    Years since quitting: 43.3  . Smokeless tobacco: Never Used  Vaping Use  . Vaping Use: Never used  Substance Use Topics  . Alcohol use: No  . Drug use: No   Additional social history:  Please also refer to relevant sections of EMR.  Family History: Family History  Problem Relation Age of Onset  . Heart disease Sister   . Hypertension Sister   . Asthma Sister   . Breast cancer Maternal Aunt   . Ovarian cancer Paternal Aunt   . Colon cancer Neg Hx   . Esophageal cancer Neg Hx   . Pancreatic cancer Neg Hx   . Rectal cancer Neg Hx   . Stomach cancer Neg Hx      Allergies and Medications: Allergies  Allergen Reactions  . Anectine [Succinylcholine Chloride] Other (See Comments)    Reaction:  Patient states that her body wasn't functioning.  Pt states she was put on life support.   . Dilaudid [Hydromorphone Hcl] Other (See Comments)    Slept for  3 days and 3 nights  . Hydrocodone-Acetaminophen Other (See Comments)    Hallucinations, paranoia, amnesia Pt takes Tylenol at home  . Iodine Hives and Other (See Comments)    , asthma  . Naproxen Sodium Other (See Comments)    REACTION: Asthma  . Oxycodone Hcl Other (See Comments)     Hallucinations, paranoia, amnesia Pt takes Percocet at home with no issues  . Pentosan Polysulfate Sodium Other (See Comments)    REACTION: unspecified per patient  . Pseudoephedrine Other (See Comments)     Palpitations  . Succinylcholine     REACTION: "Can't wake up".  . Tape Dermatitis    Red inflammed  . Tramadol Hcl Other (See Comments)    Unknown reaction  . Shellfish Allergy     Runny nose, cough, hoarseness   No current facility-administered medications on file prior to encounter.   Current Outpatient Medications on File Prior to Encounter  Medication Sig Dispense Refill  . celecoxib (CELEBREX) 200 MG capsule Take 1 capsule (200 mg total) by mouth daily as needed (arthritis pain). (Patient taking differently: Take 200 mg by mouth daily as needed for mild pain. ) 30 capsule 0  . Cholecalciferol (VITAMIN D3) 1000 units CAPS Take 1 capsule (1,000 Units total) by mouth daily. (Patient not taking: Reported on 07/14/2020) 60 capsule 0  . cholecalciferol (VITAMIN D3) 25 MCG (1000 UNIT) tablet Take 1,000 Units by mouth daily.    . diazepam (VALIUM) 5 MG tablet Take 1/2 tablet in AM (2.5mg ) and 1 tablet at night (5mg ). (Patient taking differently: Take 2.5-5 mg by mouth See admin instructions. 2.5mg  at 9am 5mg  at 9pm) 40 tablet 0  . EPINEPHrine 0.3 mg/0.3 mL IJ SOAJ injection Inject 0.3 mLs (0.3 mg total) into the muscle as needed for anaphylaxis. 1 each 0  . fluticasone (FLONASE) 50 MCG/ACT nasal spray Place 2 sprays into both nostrils daily. 16 g 2  . metoprolol tartrate (LOPRESSOR) 25 MG tablet Take 25 mg by mouth 2 (two) times daily.    . mirabegron ER (MYRBETRIQ) 50 MG TB24 tablet Take 50 mg by  mouth daily.    Marland Kitchen oxyCODONE-acetaminophen (PERCOCET/ROXICET) 5-325 MG tablet Take 1 tablet by mouth every 8 (eight) hours as needed for severe pain. 12 tablet 0  . PREMARIN 0.3 MG tablet Take 0.3 mg by mouth every other day.     . WELLBUTRIN XL 300 MG 24 hr tablet Take  300 mg by mouth daily.       Objective: BP 134/80   Pulse (!) 114   Temp 100 F (37.8 C) (Rectal)   Resp (!) 29   Ht 5\' 2"  (1.575 m)   Wt 54.4 kg   SpO2 100%   BMI 21.95 kg/m   Exam: General: Patient laying comfortably in bed, in no acute distress. Eyes: PERRLA, sclera white, tracking appropriately  Neck: supple, no evidence of lymphadenopathy Cardiovascular: RRR, no murmurs or gallops auscultated Respiratory: CTAB, no wheezing, rales or rhonchi noted  Gastrointestinal: soft, generalized abdominal tenderness, BS+ Ext: extremities cool to touch, radial distal pulses strong and equal bilaterally, very mild pedal edema noted bilaterally, no LE bilateral edema noted otherwise Derm: blanching, erythematous patch above gluteal cleft Neuro: AOx2, follows some commands, 5/5 right UE strength, 3/5 left UE strength Psych: mood appropriate, no agitation noted   Exam performed in the presence of chaperone.       Labs and Imaging: CBC BMET  Recent Labs  Lab 12/11/20 1138  WBC 17.9*  HGB 16.1*  HCT 51.9*  PLT 174   Recent Labs  Lab 12/11/20 1138  NA 146*  K 4.7  CL 111  CO2 23  BUN 52*  CREATININE 1.24*  GLUCOSE 241*  CALCIUM 8.9       DG Chest Port 1 View  Result Date: 12/11/2020 CLINICAL DATA:  Sepsis. EXAM: PORTABLE CHEST 1 VIEW COMPARISON:  May 04, 2020. FINDINGS: Stable cardiomediastinal silhouette. No pneumothorax or pleural effusion is noted. Lungs are clear. Multiple old right rib fractures are noted. IMPRESSION: No acute cardiopulmonary abnormality seen. Aortic Atherosclerosis (ICD10-I70.0). Electronically Signed   By: Marijo Conception M.D.   On: 12/11/2020 12:05    Donney Dice,  DO 12/11/2020, 4:06 PM PGY-1, [place] Intern pager: [phone], text pages welcome   FPTS Upper-Level Resident Addendum   I have independently interviewed and examined the patient. I have discussed the above with the original author and agree with their documentation. My edits for correction/addition/clarification are included above. Please see any attending notes.   Eulis Foster, MD PGY-2, [facility] 12/11/2020 7:39 PM  [place] Service pager: [phone] (text pages welcome through [place])

## 2020-12-11 NOTE — Progress Notes (Signed)
Amion paging system still down at this time. Charge RN made aware.

## 2020-12-11 NOTE — Hospital Course (Addendum)
[name] is an 87 year old female who presented with AMS with hx of CVA, dementia, hyperparathyroidism, hx of breast cancer, HTN and HLD.   AMS likely 2/2 to Urosepsis Patient presented to ED with several days of decreased PO intake and change in mental status. Vitals were notable for new oxygen requirement as well as increased respiratory rate and tachycardia. Her temperature was elevated to 100.0.  Labs were notable for large leukocytes on urine studies, LA of 5, Cr of 1.24, glucose of 241, and leukocytosis of 17.9. She was treated with Azithromycin and CTX for presumed urosepsis. Urine culture ultimately grew 20,000 colonies of Aerococcus species and 1000 colonies of ESBL E. coli. She was transitioned to comfort care on 4/23, so antibiotics were discontinued.  Submassive PE with right heart strain and acute hypoxemic respiratory failure Patient was noted to be tachycardic, tachypneic, and hypoxemic requiring supplemental oxygen.  CTPA demonstrated acute pulmonary emboli with with large and proximal clot burden with right heart strain.  She was started on anticoagulation with heparin drip, but this was discontinued on 4/23 when she was made comfort care.  She was breathing comfortably on room air on discharge.  Elevated Troponin  Patient's labs on admission noted elevated troponin of 277>304>241>196 EKG did not show changes. Likely due to demand ischemia.   AKI on CKD stage IIIb Initially with elevated creatinine likely prerenal in the setting of poor oral intake.  She was treated with IV fluids with improvement in her creatinine.  IV fluids were discontinued on 4/23 when she was made comfort care.  Hematochezia with normocytic anemia Patient had a few episodes of hematochezia during admission with drop in her hemoglobin in the setting of starting anticoagulation, though she did not require any blood transfusions.  Most recent hemoglobin 9.6 prior to discharge.  Lab draws were  discontinued on 4/23 when she was made comfort care.  Irritated gluteal cleft Patient was noted to have a erythematous patch on her gluteal cleft unchanged since admission, deemed chronic given wheelchair-bound status.  Goals of care Patient lacking capacity throughout admission. Palliative consulted to discuss goals of care. Palliative team spoke with healthcare POA, Otila Kluver, who desired comfort care for patient.   All other issues chronic and stable.   Issues for follow up: Recommend frequent repositioning to avoid decubitus ulcer formation especially in her sacrum.

## 2020-12-12 ENCOUNTER — Inpatient Hospital Stay (HOSPITAL_COMMUNITY): Payer: Medicare Other

## 2020-12-12 DIAGNOSIS — J9601 Acute respiratory failure with hypoxia: Secondary | ICD-10-CM | POA: Diagnosis not present

## 2020-12-12 DIAGNOSIS — R4182 Altered mental status, unspecified: Secondary | ICD-10-CM | POA: Diagnosis not present

## 2020-12-12 DIAGNOSIS — N179 Acute kidney failure, unspecified: Secondary | ICD-10-CM

## 2020-12-12 DIAGNOSIS — I2699 Other pulmonary embolism without acute cor pulmonale: Secondary | ICD-10-CM

## 2020-12-12 DIAGNOSIS — E860 Dehydration: Secondary | ICD-10-CM

## 2020-12-12 DIAGNOSIS — A419 Sepsis, unspecified organism: Principal | ICD-10-CM

## 2020-12-12 DIAGNOSIS — N3001 Acute cystitis with hematuria: Secondary | ICD-10-CM

## 2020-12-12 DIAGNOSIS — F0391 Unspecified dementia with behavioral disturbance: Secondary | ICD-10-CM

## 2020-12-12 DIAGNOSIS — Z7189 Other specified counseling: Secondary | ICD-10-CM

## 2020-12-12 DIAGNOSIS — E440 Moderate protein-calorie malnutrition: Secondary | ICD-10-CM

## 2020-12-12 LAB — CBC
HCT: 45.5 % (ref 36.0–46.0)
HCT: 41.2 % (ref 36.0–46.0)
Hemoglobin: 14.2 g/dL (ref 12.0–15.0)
Hemoglobin: 12.9 g/dL (ref 12.0–15.0)
MCH: 30.3 pg (ref 26.0–34.0)
MCH: 30.9 pg (ref 26.0–34.0)
MCHC: 31.2 g/dL (ref 30.0–36.0)
MCHC: 31.3 g/dL (ref 30.0–36.0)
MCV: 97.2 fL (ref 80.0–100.0)
MCV: 98.6 fL (ref 80.0–100.0)
Platelets: 168 10*3/uL (ref 150–400)
Platelets: 138 10*3/uL — ABNORMAL LOW (ref 150–400)
RBC: 4.68 MIL/uL (ref 3.87–5.11)
RBC: 4.18 MIL/uL (ref 3.87–5.11)
RDW: 12.5 % (ref 11.5–15.5)
RDW: 12.5 % (ref 11.5–15.5)
WBC: 21 10*3/uL — ABNORMAL HIGH (ref 4.0–10.5)
WBC: 20.1 10*3/uL — ABNORMAL HIGH (ref 4.0–10.5)
nRBC: 0 % (ref 0.0–0.2)
nRBC: 0 % (ref 0.0–0.2)

## 2020-12-12 LAB — LACTIC ACID, PLASMA
Lactic Acid, Venous: 2.3 mmol/L (ref 0.5–1.9)
Lactic Acid, Venous: 2 mmol/L (ref 0.5–1.9)

## 2020-12-12 LAB — VITAMIN B12: Vitamin B-12: 1015 pg/mL — ABNORMAL HIGH (ref 180–914)

## 2020-12-12 LAB — HEPARIN LEVEL (UNFRACTIONATED): Heparin Unfractionated: 0.11 IU/mL — ABNORMAL LOW (ref 0.30–0.70)

## 2020-12-12 LAB — COMPREHENSIVE METABOLIC PANEL
ALT: 63 U/L — ABNORMAL HIGH (ref 0–44)
AST: 27 U/L (ref 15–41)
Albumin: 2.7 g/dL — ABNORMAL LOW (ref 3.5–5.0)
Alkaline Phosphatase: 173 U/L — ABNORMAL HIGH (ref 38–126)
Anion gap: 10 (ref 5–15)
BUN: 52 mg/dL — ABNORMAL HIGH (ref 8–23)
CO2: 28 mmol/L (ref 22–32)
Calcium: 8.6 mg/dL — ABNORMAL LOW (ref 8.9–10.3)
Chloride: 109 mmol/L (ref 98–111)
Creatinine, Ser: 1.01 mg/dL — ABNORMAL HIGH (ref 0.44–1.00)
GFR, Estimated: 54 mL/min — ABNORMAL LOW (ref >60)
Glucose, Bld: 173 mg/dL — ABNORMAL HIGH (ref 70–99)
Potassium: 4.7 mmol/L (ref 3.5–5.1)
Sodium: 147 mmol/L — ABNORMAL HIGH (ref 135–145)
Total Bilirubin: 0.9 mg/dL (ref 0.3–1.2)
Total Protein: 5.7 g/dL — ABNORMAL LOW (ref 6.5–8.1)

## 2020-12-12 LAB — IRON AND TIBC
Iron: 40 ug/dL (ref 28–170)
Saturation Ratios: 20 % (ref 10.4–31.8)
TIBC: 202 ug/dL — ABNORMAL LOW (ref 250–450)
UIBC: 162 ug/dL

## 2020-12-12 LAB — PROTIME-INR
INR: 1.2 (ref 0.8–1.2)
Prothrombin Time: 15.5 seconds — ABNORMAL HIGH (ref 11.4–15.2)

## 2020-12-12 LAB — FOLATE: Folate: 6.4 ng/mL (ref >5.9)

## 2020-12-12 LAB — MAGNESIUM: Magnesium: 2.5 mg/dL — ABNORMAL HIGH (ref 1.7–2.4)

## 2020-12-12 LAB — TROPONIN I (HIGH SENSITIVITY): Troponin I (High Sensitivity): 196 ng/L (ref <18)

## 2020-12-12 LAB — FERRITIN: Ferritin: 675 ng/mL — ABNORMAL HIGH (ref 11–307)

## 2020-12-12 LAB — APTT: aPTT: 28 seconds (ref 24–36)

## 2020-12-12 LAB — AMMONIA: Ammonia: 23 umol/L (ref 9–35)

## 2020-12-12 MED ORDER — BOOST / RESOURCE BREEZE PO LIQD CUSTOM
1.0000 | Freq: Three times a day (TID) | ORAL | Status: DC
  Administered 2020-12-12 – 2020-12-13 (×4): 1 via ORAL

## 2020-12-12 MED ORDER — HEPARIN (PORCINE) 25000 UT/250ML-% IV SOLN
1150.0000 [IU]/h | INTRAVENOUS | Status: DC
  Administered 2020-12-12: 850 [IU]/h via INTRAVENOUS
  Administered 2020-12-13: 1150 [IU]/h via INTRAVENOUS
  Filled 2020-12-12 (×2): qty 250

## 2020-12-12 MED ORDER — PANTOPRAZOLE SODIUM 40 MG IV SOLR
40.0000 mg | Freq: Two times a day (BID) | INTRAVENOUS | Status: DC
  Administered 2020-12-12 – 2020-12-13 (×3): 40 mg via INTRAVENOUS
  Filled 2020-12-12 (×3): qty 40

## 2020-12-12 MED ORDER — SODIUM CHLORIDE 0.9 % IV SOLN
1.0000 g | INTRAVENOUS | Status: DC
  Administered 2020-12-12 – 2020-12-13 (×2): 1 g via INTRAVENOUS
  Filled 2020-12-12 (×2): qty 10

## 2020-12-12 MED ORDER — METOPROLOL TARTRATE 25 MG PO TABS
25.0000 mg | ORAL_TABLET | Freq: Two times a day (BID) | ORAL | Status: DC
  Administered 2020-12-12 – 2020-12-16 (×8): 25 mg via ORAL
  Filled 2020-12-12 (×8): qty 1

## 2020-12-12 MED ORDER — HEPARIN BOLUS VIA INFUSION
3000.0000 [IU] | Freq: Once | INTRAVENOUS | Status: AC
  Administered 2020-12-12: 3000 [IU] via INTRAVENOUS
  Filled 2020-12-12: qty 3000

## 2020-12-12 MED ORDER — PANTOPRAZOLE SODIUM 40 MG IV SOLR: 40.0000 mg | INTRAVENOUS | Status: DC

## 2020-12-12 MED ORDER — HEPARIN BOLUS VIA INFUSION
1500.0000 [IU] | Freq: Once | INTRAVENOUS | Status: AC
  Administered 2020-12-12: 1500 [IU] via INTRAVENOUS
  Filled 2020-12-12: qty 1500

## 2020-12-12 MED ORDER — IOHEXOL 350 MG/ML SOLN
70.0000 mL | Freq: Once | INTRAVENOUS | Status: AC | PRN
  Administered 2020-12-12: 70 mL via INTRAVENOUS

## 2020-12-12 MED ORDER — ADULT MULTIVITAMIN W/MINERALS CH
1.0000 | ORAL_TABLET | Freq: Every day | ORAL | Status: DC
  Administered 2020-12-12 – 2020-12-13 (×2): 1 via ORAL
  Filled 2020-12-12 (×2): qty 1

## 2020-12-12 NOTE — Progress Notes (Signed)
OT Cancellation Note  Patient Details Name: [name] MRN: [id number] DOB: 06/07/1930   Cancelled Treatment:    Reason Eval/Treat Not Completed: Medical issues which prohibited therapy. Pt found to have acute PE with R heart strain. Heparin has been started this am.  Will hold for 24 hr per rehab guidelines.  Singleton Hickox A Sharyon Peitz 12/12/2020, 9:54 AM
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
## 2020-12-12 NOTE — Progress Notes (Signed)
Family Medicine Teaching Service Daily Progress Note Intern Pager: [phone]  Patient name: [name] Medical record number: [id number] Date of birth: 1930/07/26 Age: 87 y.o. Gender: female  Primary Care Provider: Patient, No Pcp Per (Inactive) Consultants: none Code Status: DNR  Pt Overview and Major Events to Date:  4/21: Admitted   Assessment and Plan: [name] is a 87 y.o. female presenting with AMS . PMH is significant for dementia, CVA, HTN, depression and bipolar disorder.   AMS  Sepsis 2/2 to UTI  History of dementia Altered mental status improved although does not seem to be quite at baseline based on information provided by Amy, caretaker, on admission. Lactic acid down trending from 5 to now 2.3. On admission, UA positive for large leukocytes and rare bacteria without nitrites or hemoglobin, 6-10 RBCs and >50 WBCs. CXR demonstrated no acute cardiopulmonary abnormality. CT head notable for no acute finding, chronic small vessel ischemia with notable brain atrophy which has progressed from 2021. Initially given azithromycin in ED for concern for pneumonia, likely infectious etiology is sepsis from urinary source which is likely causing her acute mental status changes in the setting of vascular dementia which may possibly be patient's new baseline.  -s/p 500 mg azithromycin and ceftriaxone 2 g (4/21). Continue ceftriaxone 1 g (4/22-) -100 mL/hr LR  -up with assistance -delirium precautions  -neuro checks qh4 -PT/OT eval and treat   Submassive PE  Acute hypoxic respiratory failure  Denies dyspnea, chest palpitations and chest pain. Tachycardic on exam. Given significant increased oxygen requirement, concern for PE. CTA chest demonstrated acute pulmonary emboli with large and proximal clot burden with right heart strain.  -heparin per pharmacy  -continuous pulse ox  -wean O2 as tolerated   Hematochezia  History of melena since December 2021. Per chart  review, colonoscopy performed in 2016 notable for 6 mm sessile polyp that was removed along with external and internal hemorrhoids. PT slightly elevated. -monitor  -IV protonix 40 mg   AKI in the setting of CKD stage 3b Improved with hydration Cr 1.01 compared to Cr 1.24 on admission. Likely prerenal in the setting of poor oral intake leading to dehydration.  -LR 100 mL/hr - avoid nephrotoxic agents  - monitor BMP  Leukocytosis Elevated WBC 21 compared to admission 17.9, secondary to sepsis. CXR demonstrated no acute cardiopulmonary abnormality. UA notable for large leukocytes and >50 WBC with rare bacteria. Thus far afebrile. Blood cultures noted to have no growth thus far for 12 hours. - pending blood cultures for 48 hrs - pending urine cultures  - continue to monitor clinically - vitals per routine   Irritated Gluteal Cleft  Erythematous patch remains remains unchanged since admission. Chronic given patient is wheelchair bound. -repositioning to avoid decubitus ulcer formation  Elevated hemoglobin Resolved, likely due to hemoconcentration secondary to dehydration.  -monitor CBC  Elevated transaminases AST 27 wnl, ALT 63 and alkaline phosphatase 173. -am CMP  Elevated Troponin  Troponins [phone], likely secondary to demand ischemia.  - am EKG - monitor clinically, low concern for cardiac etiology   Hypernatremia Na 147, likely in the setting of dehydration. - fluids as above - am CMP  Hyperglycemia Improved since admission, blood glucose 173 without known history of diabetes.  -monitor CMP   History of CVA Unknown of when CVA took place. Most recent CT head on 07/14/2021 demonstrated no acute intracranial abnormality or acute traumatic injury identified, stable noncontrast CT appearance of advanced chronic small vessel disease. CT head performed  4/22 demonstrated no acute finding, chronic small vessel ischemic with notable progressive brain atrophy. No home  medications noted. - PT/OT eval and treat  - neuro checks   Hypertension  Tachycardia Normotensive on admission, BP 114/71. Known history of hypertension, home meds include metoprolol 25 mg. Overall persistently tachycardic, most recently 109.  -restart metoprolol   Bipolar disorder  Depression Home meds include olanzapine 2.5 mg, bupropion 150 mg and setraline 25 mg. -hold home meds, patient NPO -restart home meds after med rec complete and after SLP eval  Protein calorie malnutrition Albumin 2.7 and total protein 6.7 this morning, secondary to inadequate oral intake. -NPO pending SLP eval -encourage diet when able -nutrition consult placed   Goals of care Dr. Alba Cory has discussed with POA that patient would greatly benefit from goals of care discussion. POA was agreeable.  -palliative consulted, appreciate involvement   FEN/GI: NPO pending SLP eval PPx: SCDs   Status is: Inpatient  Remains inpatient appropriate because:Altered mental status   Dispo: The patient is from: ALF              Anticipated d/c is to: ALF              Patient currently is not medically stable to d/c.   Difficult to place patient No        Subjective:  No significant overnight events reported. Patient answers all questions. When asked if she is in pain, she responds yes and when asked where she states its emotional and that it is from stress. She is worried about her blood pressure, when asked where she heard that from, responds "from the tv." When asked who Amy and Otila Kluver are, she answers "those are my friends." Amy and Otila Kluver are caretakers of the patient at ALF.   Objective: Temp:  [97.5 F (36.4 C)-100 F (37.8 C)] 98.6 F (37 C) (04/22 0300) Pulse Rate:  [108-131] 109 (04/22 0300) Resp:  [16-51] 17 (04/22 0300) BP: (93-150)/(58-130) 114/71 (04/22 0300) SpO2:  [96 %-100 %] 98 % (04/22 0300) Weight:  [54.4 kg] 54.4 kg (04/21 1205) Physical Exam: General: Patient  sitting upright in the bed, in no acute distress. Cardiovascular: tachycardic, no murmurs or gallops auscultated  Respiratory: CTAB, no wheezing, rales or rhonchi noted, breathing comfortably on 6L O2 Abdomen: soft, nontender, BS+ Extremities: radial and distal pulses strong and equal bilaterally, pedal edema noted bilaterally  Neuro: AOx2, 5/5 grip strength bilaterally, nonsensical speech, follows some commands Psych: no agitation noted, pleasant  Laboratory: Recent Labs  Lab 12/11/20 1138 12/12/20 0036  WBC 17.9* 21.0*  HGB 16.1* 14.2  HCT 51.9* 45.5  PLT 174 168   Recent Labs  Lab 12/11/20 1138 12/12/20 0036  NA 146* 147*  K 4.7 4.7  CL 111 109  CO2 23 28  BUN 52* 52*  CREATININE 1.24* 1.01*  CALCIUM 8.9 8.6*  PROT 6.3* 5.7*  BILITOT 0.8 0.9  ALKPHOS 207* 173*  ALT 74* 63*  AST 35 27  GLUCOSE 241* 173*      Imaging/Diagnostic Tests: CT HEAD WO CONTRAST  Result Date: 12/12/2020 CLINICAL DATA:  Head trauma, minor.  Elevated D-dimer EXAM: CT HEAD WITHOUT CONTRAST TECHNIQUE: Contiguous axial images were obtained from the base of the skull through the vertex without intravenous contrast. COMPARISON:  07/14/2020 FINDINGS: Brain: No evidence of acute infarction, hemorrhage, hydrocephalus, extra-axial collection or mass lesion/mass effect. Brain atrophy with confluent chronic small vessel ischemia in the periventricular white matter. Chronic lacunar infarcts in the  bilateral thalamus. When measuring the dilated lateral ventricles, there has been progressive atrophy since 2021. Vascular: No hyperdense vessel or unexpected calcification. Skull: Normal. Negative for fracture or focal lesion. Sinuses/Orbits: No acute finding. IMPRESSION: 1. No acute finding. 2. Notable brain atrophy which has progressed from 2021. 3. Chronic small vessel ischemia. Electronically Signed   By: Monte Fantasia M.D.   On: 12/12/2020 06:17   CT ANGIO CHEST PE W OR WO CONTRAST  Result Date:  12/12/2020 CLINICAL DATA:  Head trauma and elevated D-dimer. EXAM: CT ANGIOGRAPHY CHEST WITH CONTRAST TECHNIQUE: Multidetector CT imaging of the chest was performed using the standard protocol during bolus administration of intravenous contrast. Multiplanar CT image reconstructions and MIPs were obtained to evaluate the vascular anatomy. CONTRAST:  62mL OMNIPAQUE IOHEXOL 350 MG/ML SOLN COMPARISON:  None. FINDINGS: Cardiovascular: Bilateral main, lobar, and segmental pulmonary emboli affecting all lobes. Essentially occlusive clot is seen in multiple locations and clot is nearly occlusive in the left main pulmonary artery with only small volume flow into the left upper lobe. There is RV to LV ratio derangement of approximately 1.5. The left ventricular thickness is likely hypertrophic. Extensive atheromatous plaque in the aorta. Extensive coronary calcification. Mediastinum/Nodes: Right mastectomy. No adenopathy or masslike finding. Lungs/Pleura: Subpleural ground-glass and consolidative opacity in the right lower lobe and to a lesser extent in the left lower lobe, some combination of pulmonary infarct and atelectasis. No edema, effusion, or pneumothorax. Upper Abdomen: No acute finding. Musculoskeletal: Multiple remote and healed rib fractures. T11 and T12 nonacute compression fractures with sclerosis and moderate height loss. Remote right eleventh rib fracture. Remote T3 and T9 compression fractures. Review of the MIP images confirms the above findings. Critical Value/emergent results were called by telephone at the time of interpretation on 12/12/2020 at 6:24 am to provider Dr Nancy Fetter, who verbally acknowledged these results. IMPRESSION: 1. Acute pulmonary emboli with large and proximal clot burden. Right heart strain (RV/LV Ratio = 1.3) consistent with at least submassive (intermediate risk) PE. The presence of right heart strain has been associated with an increased risk of morbidity and mortality. Please refer to  the "PE Focused" order set in EPIC. 2. Mild pulmonary infarction.  No pulmonary edema. 3. Extensive atherosclerosis. Electronically Signed   By: Monte Fantasia M.D.   On: 12/12/2020 06:24   DG Chest Port 1 View  Result Date: 12/11/2020 CLINICAL DATA:  Sepsis. EXAM: PORTABLE CHEST 1 VIEW COMPARISON:  May 04, 2020. FINDINGS: Stable cardiomediastinal silhouette. No pneumothorax or pleural effusion is noted. Lungs are clear. Multiple old right rib fractures are noted. IMPRESSION: No acute cardiopulmonary abnormality seen. Aortic Atherosclerosis (ICD10-I70.0). Electronically Signed   By: Marijo Conception M.D.   On: 12/11/2020 12:05    Donney Dice, DO 12/12/2020, 6:40 AM PGY-1, [place] Intern pager: [phone], text pages welcome

## 2020-12-12 NOTE — Progress Notes (Signed)
Patient left floor to CT at this time.

## 2020-12-12 NOTE — Progress Notes (Signed)
Received phone call from Radiology that CTA chest confirms massive PE with right heart strain. Discussed with attending Dr. Andria Frames, heparin order placed.  Gladys Damme, MD [facility], PGY-2

## 2020-12-12 NOTE — Progress Notes (Addendum)
FPTS Interim Progress Note  S: Went to bedside to check on patient.  Was paged by RN earlier in the evening regarding patient having BM with blood clots.  Patient reports feeling a little short of breath, but is breathing fine overall.  She is concerned about the blood clots in her BM earlier, though she had thought it was coming from her bladder.  Patient stated that she thought she had seen me somewhere before and thinks that I have been over at her house.  She is hoping to get back to her house tomorrow.  I informed her that she will likely be here at least a couple of days.  O: BP 140/84 (BP Location: Right Arm)   Pulse (!) 104   Temp 97.6 F (36.4 C) (Oral)   Resp 20   Ht 5\' 2"  (1.575 m)   Wt 54.4 kg   SpO2 98%   BMI 21.95 kg/m   General: Resting in bed comfortably, NAD CV: Tachycardic, regular rhythm, no murmurs Respiratory: Clear to auscultation bilaterally, breathing comfortably on nasal cannula Neuro: Alert, conversant  SPO2 98% on 4 L [place] HR around 120 on monitor  A/P: Still seems confused.  Respiratory status slightly improved, now down to 4 L nasal cannula.  She had 1 episode of hematochezia earlier this evening which is likely resulting from heparin drip started this morning for treatment of PE.  Hemoglobin this evening 12.9 within normal limits, slightly decreased from 14.2.  We will continue to monitor. Ordered clear liquid diet as recommended by SLP.  Zola Button, MD 12/12/2020, 10:06 PM PGY-1, [place] Medicine Service pager [phone]

## 2020-12-12 NOTE — Progress Notes (Signed)
CT called to update that patient has received pre-scan medications due to iodine allergy. Patient added to schedule at this time and transport will be on way shortly to take patient to scan.

## 2020-12-12 NOTE — Progress Notes (Signed)
Attempt x2 to call CT and ensure pt on schedule for 0500 as discussed with MDs. No answer at this time, will try again. Charge RN made aware.

## 2020-12-12 NOTE — Progress Notes (Incomplete)
Patient with ongoing loose BM with blood clots earlier this shift. Staff had to [employer] 

## 2020-12-12 NOTE — Progress Notes (Signed)
Pharmacist called to bolus and increase heparin rate. Notified pt had a small BM with minimal amount of blood noted. Continue to monitor for bleeding.

## 2020-12-12 NOTE — Progress Notes (Signed)
PT Cancellation Note  Patient Details Name: [name] MRN: [id number] DOB: 10/13/1929   Cancelled Treatment:    Reason Eval/Treat Not Completed: Medical issues which prohibited therapy Pt found to have submassive acute PE with R heart strain. Heparin has been started.  Will hold for 24 hr per rehab guidelines. Abran Richard, PT Acute Rehab Services Pager [phone] [facility] [phone] Karlton Lemon 12/12/2020, 9:46 AM

## 2020-12-12 NOTE — Progress Notes (Signed)
Contacted patient's caregiver, Cory Munch, to provide care updates and introduce palliative care consult given patient's dementia, possible GI bleed and recently diagnosed pulmonary embolism requiring anticoagulation. She confirmed patient's name and DOB as well as her status as patient's HCPOA and POA.  She reports that patient has been progressively declining in the last few weeks and both caregivers were planning to discuss option of transferring patient to hospice on 4/25. Introduced the palliative medicine consult and discussion for [place]. Ms. Cory Munch reports she would like to meet with PMT and discuss. She confirms that patient has DNR order.    Eulis Foster, MD  [facility] Service, PGY-2  [place] Intern Pager [phone]

## 2020-12-12 NOTE — Evaluation (Signed)
Clinical/Bedside Swallow Evaluation Patient Details  Name: [name] MRN: [id number] Date of Birth: [birth year]  Today's Date: 12/12/2020 Time: SLP Start Time (ACUTE ONLY): 1101 SLP Stop Time (ACUTE ONLY): 1116 SLP Time Calculation (min) (ACUTE ONLY): 15.37 min  Past Medical History:  Past Medical History:  Diagnosis Date  . Adenomatous colon polyp 04/2005  . Allergic    Anectine  . ANXIETY 10/13/2007  . BACK PAIN 03/01/2007  . BREAST CANCER, HX OF 12/06/2007  . Cancer [facility])    RIGHT mastectomy with node dissection  . Complication of anesthesia    family aggergy to annectine  . Compression fracture of L4 lumbar vertebra 02/2013  . CONSTIPATION 01/06/2009  . Dementia ([place]) 2020   Per POA  . DEPRESSION 10/13/2007  . Diverticular disease   . DIVERTICULOSIS, COLON 05/11/2005  . External hemorrhoids without mention of complication 5/97/4163  . FATIGUE 08/07/2008  . HEMORRHOIDS, INTERNAL 05/11/2005  . Hypercalcemia 04/23/2009  . HYPERLIPIDEMIA NEC/NOS 03/01/2007  . HYPERTENSION 10/13/2007  . Impaired fasting glucose 11/14/2008  . INTERSTITIAL CYSTITIS 09/06/2007  . Irritable bowel syndrome 01/06/2009  . LIVER MASS 05/15/2009  . OSTEOPOROSIS 05/09/2009  . PARESTHESIA 09/06/2007  . POLYCYTHEMIA 04/23/2009  . STENOSIS, SPINAL, UNSPC REGION 03/01/2007  . Thyroid disease    Past Surgical History:  Past Surgical History:  Procedure Laterality Date  . ABDOMINAL HYSTERECTOMY    . BACK SURGERY    . BREAST SURGERY     mastectomy -right  . CERVICAL SPINE SURGERY    . CHOLECYSTECTOMY    . COLONOSCOPY W/ BIOPSIES    . cortisone injection  10/16/12   left knee  . FIXATION KYPHOPLASTY LUMBAR SPINE    . HEMORRHOID BANDING  2015-16  . PARATHYROID EXPLORATION  06/29/2011   Procedure: PARATHYROID EXPLORATION;  Surgeon: Earnstine Regal, MD;  Location: [facility] ORS;  Service: General;  Laterality: Left;  Left Superior Parathyroidectomy  . PARATHYROIDECTOMY  06/29/11   HPI:  Pt is an 87 y.o. female with PMH  is significant for dementia, CVA, HTN, depression and bipolar disorder. Pt presented with AMS and was diagnosed with sepsis secondary to UTI, possible GI bleed and acute pulmonary embolism as shown on CTA chest. CT head was negative for acute changes. CXR 4./21: No acute cardiopulmonary abnormality. Per MD's note, pt normally eats a normal diet but demonstrated SOB with trying to drink on 4/20. Palliative care has been consulted to help establish GOC.   Assessment / Plan / Recommendation Clinical Impression  Pt was seen for bedside swallow evaluation. Pt's case was discussed with Dr. Alba Cory who advised that the pt was originally made NPO for the swallow evaluation and requested that it be restricted to liquids due to the possible GI bleed. Oral mechanism exam was limited due to pt's difficulty following commands;  however, lingual ROM appeared slightly reduced. She presented with adequate, natural dentition. She tolerated gelatin, ice chips, and thin liquids via straw without overt s/sx of aspiration. Bolus manipulation of ice chips and gelatin appeared mildly prolonged, but oral holding was not demonstrated and oral clearance was [facility]. A clear liquid diet will be initiated at this time. SLP will follow pt for possible advancement pending decisions regarding GOC. SLP Visit Diagnosis: Dysphagia, unspecified (R13.10)    Aspiration Risk  Mild aspiration risk    Diet Recommendation Thin liquid (clear liquids)   Liquid Administration via: Cup;Straw Medication Administration: Crushed with puree Supervision: Staff to assist with self feeding;Full supervision/cueing for compensatory strategies Compensations: Slow rate;Small  sips/bites Postural Changes: Seated upright at 90 degrees    Other  Recommendations Oral Care Recommendations: Oral care BID;Staff/trained caregiver to provide oral care   Follow up Recommendations  (TBD)      Frequency and Duration min 2x/week  2 weeks        Prognosis Prognosis for Safe Diet Advancement: Fair Barriers to Reach Goals: Cognitive deficits      Swallow Study   General Date of Onset: 12/11/20 HPI: Pt is an 87 y.o. female with PMH is significant for dementia, CVA, HTN, depression and bipolar disorder. Pt presented with AMS and was diagnosed with sepsis secondary to UTI, possible GI bleed and acute pulmonary embolism as shown on CTA chest. CT head was negative for acute changes. CXR 4./21: No acute cardiopulmonary abnormality. Per MD's note, pt normally eats a normal diet but demonstrated SOB with trying to drink on 4/20. Palliative care has been consulted to help establish GOC. Type of Study: Bedside Swallow Evaluation Previous Swallow Assessment: None Diet Prior to this Study: NPO Temperature Spikes Noted: No Respiratory Status: Nasal cannula History of Recent Intubation: No Behavior/Cognition: Alert;Cooperative;Confused Oral Care Completed by SLP: No Oral Cavity - Dentition: Adequate natural dentition Self-Feeding Abilities: Needs assist Patient Positioning: Upright in bed;Postural control adequate for testing Baseline Vocal Quality: Low vocal intensity Volitional Cough: Cognitively unable to elicit Volitional Swallow: Unable to elicit    Oral/Motor/Sensory Function Overall Oral Motor/Sensory Function:  (unable to thoroughly assess)   Ice Chips Ice chips: Within functional limits Presentation: Spoon   Thin Liquid Thin Liquid: Within functional limits Presentation: Straw    Nectar Thick Nectar Thick Liquid: Not tested   Honey Thick Honey Thick Liquid: Not tested   Puree Puree: Not tested   Solid     Solid: Not tested     [name], [place], [place] Office number [phone] Pager [phone]  [name] 12/12/2020,11:33 AM

## 2020-12-12 NOTE — Progress Notes (Signed)
Initial Nutrition Assessment  DOCUMENTATION CODES:   Non-severe (moderate) malnutrition in context of chronic illness  INTERVENTION:   -MVI with minerals daily -Boost Breeze po TID, each supplement provides 250 kcal and 9 grams of protein -Feeding assistance with meals -RD will follow for diet advancement and adjust supplement regimen as appropriate based upon goals of care  NUTRITION DIAGNOSIS:   Moderate Malnutrition related to chronic illness (dementia) as evidenced by mild fat depletion,moderate fat depletion,mild muscle depletion,moderate muscle depletion,percent weight loss.  GOAL:   Patient will meet greater than or equal to 90% of their needs  MONITOR:   PO intake,Supplement acceptance,Diet advancement,Labs,Weight trends,Skin,I & O's  REASON FOR ASSESSMENT:   Consult Assessment of nutrition requirement/status  ASSESSMENT:   [name] is a 88 y.o. female presenting with AMS . PMH is significant for dementia, CVA, HTN, depression and bipolar disorder.  Pt admitted with AMS, sepsis secondary to UTI, and acute PE with rt heart strain.   Reviewed I/O's: -800 ml x 24 hours  UOP: 800 ml x 24 hours  Case discussed with SLP; pt has been advanced to clear liquid diet. Per SLP, pt with minimal intake, however, did not show signs of aspiration on thin liquids. Unable to advance diet further at this time due to concern for GIB.   No family at bedside to provide additional history. Pt answered "I don't know" to most questions, but became more conversant with this RD as interview progressed (able to tell this RD that her hands were cold and also stated "I feel liquid running down my rt shoulder- however, RD did not observe this during assessment).   Per H&P, pt with poor oral intake over the past 2 weeks.  Reviewed wt hx; pt has experienced a 12.3% wt loss over the past 7 months, which is significant for time frame.   Palliative care team scheduled family meeting  this afternoon to further discuss goals of care.   Medications reviewed and include lactated ringers @ 100 ml/hr.   Labs reviewed: Na: 147. Mg: 2.5.   NUTRITION - FOCUSED PHYSICAL EXAM:  Flowsheet Row Most Recent Value  Orbital Region Mild depletion  Upper Arm Region Moderate depletion  Thoracic and Lumbar Region No depletion  Buccal Region Mild depletion  Temple Region Mild depletion  Clavicle Bone Region Mild depletion  Clavicle and Acromion Bone Region Mild depletion  Scapular Bone Region Mild depletion  Dorsal Hand Moderate depletion  Patellar Region Mild depletion  Anterior Thigh Region Mild depletion  Posterior Calf Region Mild depletion  Edema (RD Assessment) None  Hair Reviewed  Eyes Reviewed  Mouth Reviewed  Skin Reviewed  Nails Reviewed       Diet Order:   Diet Order            Diet NPO time specified  Diet effective now                 EDUCATION NEEDS:   No education needs have been identified at this time  Skin:  Skin Assessment: Reviewed RN Assessment  Last BM:  12/11/20  Height:   Ht Readings from Last 1 Encounters:  12/11/20 5\' 2"  (1.575 m)    Weight:   Wt Readings from Last 1 Encounters:  12/11/20 54.4 kg    Ideal Body Weight:  52.3 kg  BMI:  Body mass index is 21.95 kg/m.  Estimated Nutritional Needs:   Kcal:  1450-1650  Protein:  65-80 grams  Fluid:  > 1.4 L  Loistine Chance, RD, LDN, [place] Registered Dietitian II Certified Diabetes Care and Education Specialist Please refer to [facility] for RD and/or RD on-call/weekend/after hours pager

## 2020-12-12 NOTE — Progress Notes (Addendum)
[place] for Heparin  Indication: pulmonary embolus  Allergies  Allergen Reactions  . Anectine [Succinylcholine Chloride] Other (See Comments)    Reaction:  Patient states that her body wasn't functioning.  Pt states she was put on life support.   . Dilaudid [Hydromorphone Hcl] Other (See Comments)    Slept for 3 days and 3 nights  . Hydrocodone-Acetaminophen Other (See Comments)    Hallucinations, paranoia, amnesia Pt takes Tylenol at home  . Iodine Hives and Other (See Comments)    , asthma  . Naproxen Sodium Other (See Comments)    REACTION: Asthma  . Oxycodone Hcl Other (See Comments)     Hallucinations, paranoia, amnesia Pt takes Percocet at home with no issues  . Pentosan Polysulfate Sodium Other (See Comments)    REACTION: unspecified per patient  . Pseudoephedrine Other (See Comments)     Palpitations  . Succinylcholine     REACTION: "Can't wake up".  . Tape Dermatitis    Red inflammed  . Tramadol Hcl Other (See Comments)    Unknown reaction  . Shellfish Allergy     Runny nose, cough, hoarseness    Patient Measurements: Height: 5\' 2"  (157.5 cm) Weight: 54.4 kg (120 lb) IBW/kg (Calculated) : 50.1  Vital Signs: Temp: 97.6 F (36.4 C) (04/22 1523) Temp Source: Oral (04/22 1523) BP: 140/84 (04/22 1523) Pulse Rate: 104 (04/22 1523)  Labs: Recent Labs    12/11/20 1138 12/11/20 1343 12/11/20 1953 12/11/20 2305 12/12/20 0036 12/12/20 1556  HGB 16.1*  --   --   --  14.2  --   HCT 51.9*  --   --   --  45.5  --   PLT 174  --   --   --  168  --   APTT 27  --   --   --  28  --   LABPROT 14.9  --   --   --  15.5*  --   INR 1.2  --   --   --  1.2  --   HEPARINUNFRC  --   --   --   --   --  0.11*  CREATININE 1.24*  --   --   --  1.01*  --   TROPONINIHS 277* 304* 241* 196*  --   --     Estimated Creatinine Clearance: 30.5 mL/min (A) (by C-G formula based on SCr of 1.01 mg/dL (H)).   Medical History: Past Medical  History:  Diagnosis Date  . Adenomatous colon polyp 04/2005  . Allergic    Anectine  . ANXIETY 10/13/2007  . BACK PAIN 03/01/2007  . BREAST CANCER, HX OF 12/06/2007  . Cancer [facility])    RIGHT mastectomy with node dissection  . Complication of anesthesia    family aggergy to annectine  . Compression fracture of L4 lumbar vertebra 02/2013  . CONSTIPATION 01/06/2009  . Dementia ([place]) 2020   Per POA  . DEPRESSION 10/13/2007  . Diverticular disease   . DIVERTICULOSIS, COLON 05/11/2005  . External hemorrhoids without mention of complication 0/24/0973  . FATIGUE 08/07/2008  . HEMORRHOIDS, INTERNAL 05/11/2005  . Hypercalcemia 04/23/2009  . HYPERLIPIDEMIA NEC/NOS 03/01/2007  . HYPERTENSION 10/13/2007  . Impaired fasting glucose 11/14/2008  . INTERSTITIAL CYSTITIS 09/06/2007  . Irritable bowel syndrome 01/06/2009  . LIVER MASS 05/15/2009  . OSTEOPOROSIS 05/09/2009  . PARESTHESIA 09/06/2007  . POLYCYTHEMIA 04/23/2009  . STENOSIS, SPINAL, UNSPC REGION 03/01/2007  . Thyroid disease  Assessment: 87 y/o F with new-onset bilateral pulmonary embolus with RHS, continuing on heparin. Patient is not on anticoagulation PTA.   Initial heparin level low at 0.11. CBC wnl. RN notes patient with hemorrhoids and minor bleeding. She will notify if worsens. No issues with infusion per discussion with RN.  Goal of Therapy:  Heparin level 0.3-0.7 units/ml Monitor platelets by anticoagulation protocol: Yes   Plan:  Heparin 1500 bolus x 1 Increase heparin drip to 1050 units/hr Check 8hr heparin level Monitor daily CBC, s/sx bleeding   Arturo Morton, PharmD, BCPS Please check AMION for all [place] contact numbers Clinical Pharmacist 12/12/2020 4:46 PM

## 2020-12-12 NOTE — Progress Notes (Signed)
Bladder scanned at 12pm was 373ml. Pt has BM  Re-scanned at 1500 was 76ml. Abdomen soft and no tender.

## 2020-12-12 NOTE — Progress Notes (Signed)
ANTICOAGULATION CONSULT NOTE - Initial Consult  Pharmacy Consult for Heparin  Indication: pulmonary embolus  Allergies  Allergen Reactions  . Anectine [Succinylcholine Chloride] Other (See Comments)    Reaction:  Patient states that her body wasn't functioning.  Pt states she was put on life support.   . Dilaudid [Hydromorphone Hcl] Other (See Comments)    Slept for 3 days and 3 nights  . Hydrocodone-Acetaminophen Other (See Comments)    Hallucinations, paranoia, amnesia Pt takes Tylenol at home  . Iodine Hives and Other (See Comments)    , asthma  . Naproxen Sodium Other (See Comments)    REACTION: Asthma  . Oxycodone Hcl Other (See Comments)     Hallucinations, paranoia, amnesia Pt takes Percocet at home with no issues  . Pentosan Polysulfate Sodium Other (See Comments)    REACTION: unspecified per patient  . Pseudoephedrine Other (See Comments)     Palpitations  . Succinylcholine     REACTION: "Can't wake up".  . Tape Dermatitis    Red inflammed  . Tramadol Hcl Other (See Comments)    Unknown reaction  . Shellfish Allergy     Runny nose, cough, hoarseness    Patient Measurements: Height: 5\' 2"  (157.5 cm) Weight: 54.4 kg (120 lb) IBW/kg (Calculated) : 50.1  Vital Signs: Temp: 98.6 F (37 C) (04/22 0300) Temp Source: Oral (04/22 0300) BP: 114/71 (04/22 0300) Pulse Rate: 109 (04/22 0300)  Labs: Recent Labs    12/11/20 1138 12/11/20 1343 12/11/20 1953 12/11/20 2305 12/12/20 0036  HGB 16.1*  --   --   --  14.2  HCT 51.9*  --   --   --  45.5  PLT 174  --   --   --  168  APTT 27  --   --   --  28  LABPROT 14.9  --   --   --  15.5*  INR 1.2  --   --   --  1.2  CREATININE 1.24*  --   --   --  1.01*  TROPONINIHS 277* 304* 241* 196*  --     Estimated Creatinine Clearance: 30.5 mL/min (A) (by C-G formula based on SCr of 1.01 mg/dL (H)).   Medical History: Past Medical History:  Diagnosis Date  . Adenomatous colon polyp 04/2005  . Allergic    Anectine   . ANXIETY 10/13/2007  . BACK PAIN 03/01/2007  . BREAST CANCER, HX OF 12/06/2007  . Cancer [facility])    RIGHT mastectomy with node dissection  . Complication of anesthesia    family aggergy to annectine  . Compression fracture of L4 lumbar vertebra 02/2013  . CONSTIPATION 01/06/2009  . Dementia ([place]) 2020   Per POA  . DEPRESSION 10/13/2007  . Diverticular disease   . DIVERTICULOSIS, COLON 05/11/2005  . External hemorrhoids without mention of complication 2/70/3500  . FATIGUE 08/07/2008  . HEMORRHOIDS, INTERNAL 05/11/2005  . Hypercalcemia 04/23/2009  . HYPERLIPIDEMIA NEC/NOS 03/01/2007  . HYPERTENSION 10/13/2007  . Impaired fasting glucose 11/14/2008  . INTERSTITIAL CYSTITIS 09/06/2007  . Irritable bowel syndrome 01/06/2009  . LIVER MASS 05/15/2009  . OSTEOPOROSIS 05/09/2009  . PARESTHESIA 09/06/2007  . POLYCYTHEMIA 04/23/2009  . STENOSIS, SPINAL, UNSPC REGION 03/01/2007  . Thyroid disease    Assessment: 87 y/o F with new onset bilateral pulmonary embolus per CT Angio. Starting heparin. CBC/renal function ok. PTA meds reviewed.   Goal of Therapy:  Heparin level 0.3-0.7 units/ml Monitor platelets by anticoagulation protocol: Yes   Plan:  Heparin 3000 units BOLUS Start heparin drip at 850 units/hr 1500 Heparin level Daily CBC/Heparin level Monitor for bleeding  Narda Bonds, PharmD, BCPS Clinical Pharmacist Phone: [phone]

## 2020-12-12 NOTE — Progress Notes (Signed)
MD made aware of small amount of blood noted in stool and changed in heparin order per pharmacy.

## 2020-12-12 NOTE — Progress Notes (Signed)
Patient has not voided since admission to floor. Per protocol bladder scan completed and 882mL showing. Straight cathx1  completed at this time and sterile technique maintained throughout (see flowsheets).

## 2020-12-12 NOTE — Plan of Care (Signed)
  Problem: Activity: Goal: Risk for activity intolerance will decrease Outcome: Progressing   Problem: Pain Managment: Goal: General experience of comfort will improve Outcome: Progressing   Problem: Safety: Goal: Ability to remain free from injury will improve Outcome: Progressing   

## 2020-12-12 NOTE — TOC Initial Note (Addendum)
Transition of Care [facility]) - Initial/Assessment Note    Patient Details  Name: [name] MRN: [id number] Date of Birth: [birth year]  [facility] CM/SW Contact:    Sharin Mons, RN Phone Number: 12/12/2020, 1:01 PM  Clinical Narrative:                 Admitted with AMS/ Sepsis 2/2 UTI, hx of  dementia, CVA, HTN, depression and bipolar disorder. Pt is a LTC resident @ [facility].    --- CTA chest demonstrated acute pulmonary emboli with large          and proximal clot burden with right heart strain, 4/22.        Carolynn Serve [place] (Friend/ DPOA)     [phone]      NCM called and spoke with Tena, NOK/DPOA, regarding d/c planning. Tena confirmed pt is a LTC resident @ [facility]. Carolynn Serve stated she has a palliative meeting today to discuss [facility] regarding pt.  TOC team following and will assist with disposition needs....   Expected Discharge Plan: Skilled Nursing Facility Barriers to Discharge: Continued Medical Work up   Patient Goals and CMS Choice        Expected Discharge Plan and Services Expected Discharge Plan: [place]   Prior Living Arrangements/Services     Activities of Daily Living Home Assistive Devices/Equipment: Eyeglasses,Wheelchair ADL Screening (condition at time of admission) Patient's cognitive ability adequate to safely complete daily activities?: No Is the patient deaf or have difficulty hearing?: No Does the patient have difficulty seeing, even when wearing glasses/contacts?: Yes Does the patient have difficulty concentrating, remembering, or making decisions?: Yes Patient able to express need for assistance with ADLs?: No Does the patient have difficulty dressing or bathing?: Yes Independently performs ADLs?: No Communication: Independent Dressing (OT): Needs assistance Is this a change from baseline?: Pre-admission baseline Grooming: Dependent Is this a change from baseline?: Pre-admission  baseline Feeding: Dependent Is this a change from baseline?: Pre-admission baseline Bathing: Dependent Is this a change from baseline?: Pre-admission baseline Toileting: Dependent Is this a change from baseline?: Pre-admission baseline In/Out Bed: Dependent Is this a change from baseline?: Pre-admission baseline Walks in Home: Dependent (wheelchair) Is this a change from baseline?: Pre-admission baseline Does the patient have difficulty walking or climbing stairs?: Yes Weakness of Legs: Both Weakness of Arms/Hands: Both  Permission Sought/Granted                  Emotional Assessment              Admission diagnosis:  Altered mental status [R41.82] Acute cystitis with hematuria [N30.01] Altered mental status, unspecified altered mental status type [R41.82] Sepsis, due to unspecified organism, unspecified whether acute organ dysfunction present [facility]) [A41.9] Patient Active Problem List   Diagnosis Date Noted  . Acute cystitis with hematuria   . Sepsis ([place])   . Acute respiratory failure with hypoxia ([place])   . Pulmonary embolism and infarction ([place])   . AKI (acute kidney injury) ([place])   . Dehydration   . Moderate protein-calorie malnutrition ([place])   . Altered mental status 12/11/2020  . Syncope 07/17/2019  . Dementia ([place]) 2020  . Cognitive deficit due to recent stroke 03/08/2017  . Alteration of sensation as late effect of stroke 03/08/2017  . Gait disturbance, post-stroke 03/08/2017  . Acute arterial ischemic stroke, vertebrobasilar, thalamic, left ([place]) 01/10/2017  . Neurologic gait disorder   . Depression   . Benign essential HTN   .  History of breast cancer   . Stroke (cerebrum) ([place]) 01/08/2017  . TIA (transient ischemic attack) 12/16/2016  . Dysarthria 12/16/2016  . Leg weakness 12/16/2016  . EKG abnormalities 12/16/2016  . Chronic pain 12/16/2016  . Elevated blood pressure 12/22/2014  . Internal hemorrhoids with bleeding and prolapse 11/04/2014  .  Lactose intolerance 11/04/2014  . Hemorrhoid 09/11/2014  . Rectal bleeding 09/11/2014  . IBS (irritable bowel syndrome) 09/25/2013  . Hyperparathyroidism, primary ([place]) 05/26/2011  . Osteoporosis 05/09/2009  . POLYCYTHEMIA 04/23/2009  . IMPAIRED FASTING GLUCOSE 11/14/2008  . BREAST CANCER, HX OF 12/06/2007  . Anxiety state 10/13/2007  . Essential hypertension 10/13/2007  . INTERSTITIAL CYSTITIS 09/06/2007  . HLD (hyperlipidemia) 03/01/2007  . Backache 03/01/2007   PCP:  Patient, No Pcp Per (Inactive) Pharmacy:   [id number] - [place], [place] [address] Phone: [phone] Fax: [phone]     Social Determinants of Health (SDOH) Interventions    Readmission Risk Interventions No flowsheet data found.

## 2020-12-12 NOTE — Progress Notes (Signed)
Patient more alert and took some booster breeze. Respiration and HR better. Will continue to monitor.

## 2020-12-12 NOTE — Progress Notes (Signed)
Patients in bed, alert and confusion. No s/s of pain or discomfort. On oxygen 5lpm Sat 99% apnea period. HR up. MD at bed side and new order noted. Will continue to monitor.

## 2020-12-12 NOTE — Progress Notes (Signed)
Patient in bed alert and responsive. No change in condition/ HR in 130-138 which MD aware of and no new order. Continue to monitor.

## 2020-12-12 NOTE — Progress Notes (Incomplete)
Patient has ongoing loose BM with blood clots earlier this shift. Pt ahs

## 2020-12-13 DIAGNOSIS — J9601 Acute respiratory failure with hypoxia: Secondary | ICD-10-CM | POA: Diagnosis not present

## 2020-12-13 DIAGNOSIS — E440 Moderate protein-calorie malnutrition: Secondary | ICD-10-CM | POA: Diagnosis not present

## 2020-12-13 DIAGNOSIS — N3001 Acute cystitis with hematuria: Secondary | ICD-10-CM | POA: Diagnosis not present

## 2020-12-13 DIAGNOSIS — I2699 Other pulmonary embolism without acute cor pulmonale: Secondary | ICD-10-CM | POA: Diagnosis not present

## 2020-12-13 LAB — CBC
HCT: 31.6 % — ABNORMAL LOW (ref 36.0–46.0)
HCT: 31.8 % — ABNORMAL LOW (ref 36.0–46.0)
Hemoglobin: 9.8 g/dL — ABNORMAL LOW (ref 12.0–15.0)
Hemoglobin: 9.6 g/dL — ABNORMAL LOW (ref 12.0–15.0)
MCH: 30.8 pg (ref 26.0–34.0)
MCH: 30.4 pg (ref 26.0–34.0)
MCHC: 31 g/dL (ref 30.0–36.0)
MCHC: 30.2 g/dL (ref 30.0–36.0)
MCV: 99.4 fL (ref 80.0–100.0)
MCV: 100.6 fL — ABNORMAL HIGH (ref 80.0–100.0)
Platelets: 129 10*3/uL — ABNORMAL LOW (ref 150–400)
Platelets: 142 10*3/uL — ABNORMAL LOW (ref 150–400)
RBC: 3.18 MIL/uL — ABNORMAL LOW (ref 3.87–5.11)
RBC: 3.16 MIL/uL — ABNORMAL LOW (ref 3.87–5.11)
RDW: 12.4 % (ref 11.5–15.5)
RDW: 12.2 % (ref 11.5–15.5)
WBC: 17.1 10*3/uL — ABNORMAL HIGH (ref 4.0–10.5)
WBC: 16.8 10*3/uL — ABNORMAL HIGH (ref 4.0–10.5)
nRBC: 0 % (ref 0.0–0.2)
nRBC: 0 % (ref 0.0–0.2)

## 2020-12-13 LAB — COMPREHENSIVE METABOLIC PANEL
ALT: 48 U/L — ABNORMAL HIGH (ref 0–44)
AST: 26 U/L (ref 15–41)
Albumin: 2.1 g/dL — ABNORMAL LOW (ref 3.5–5.0)
Alkaline Phosphatase: 140 U/L — ABNORMAL HIGH (ref 38–126)
Anion gap: 4 — ABNORMAL LOW (ref 5–15)
BUN: 42 mg/dL — ABNORMAL HIGH (ref 8–23)
CO2: 30 mmol/L (ref 22–32)
Calcium: 8.1 mg/dL — ABNORMAL LOW (ref 8.9–10.3)
Chloride: 110 mmol/L (ref 98–111)
Creatinine, Ser: 0.79 mg/dL (ref 0.44–1.00)
GFR, Estimated: >60 mL/min (ref >60)
Glucose, Bld: 137 mg/dL — ABNORMAL HIGH (ref 70–99)
Potassium: 4.1 mmol/L (ref 3.5–5.1)
Sodium: 144 mmol/L (ref 135–145)
Total Bilirubin: 0.9 mg/dL (ref 0.3–1.2)
Total Protein: 4.5 g/dL — ABNORMAL LOW (ref 6.5–8.1)

## 2020-12-13 LAB — URINE CULTURE: Culture: 20000 — AB

## 2020-12-13 LAB — HEPARIN LEVEL (UNFRACTIONATED)
Heparin Unfractionated: 0.26 IU/mL — ABNORMAL LOW (ref 0.30–0.70)
Heparin Unfractionated: 0.34 IU/mL (ref 0.30–0.70)

## 2020-12-13 LAB — GLUCOSE, CAPILLARY: Glucose-Capillary: 251 mg/dL — ABNORMAL HIGH (ref 70–99)

## 2020-12-13 MED ORDER — HALOPERIDOL LACTATE 2 MG/ML PO CONC: 0.5000 mg | ORAL | Status: DC | PRN

## 2020-12-13 MED ORDER — BOOST / RESOURCE BREEZE PO LIQD CUSTOM: 1.0000 | Freq: Two times a day (BID) | ORAL | Status: DC | PRN

## 2020-12-13 MED ORDER — GLYCOPYRROLATE 0.2 MG/ML IJ SOLN: 0.2000 mg | INTRAMUSCULAR | Status: DC | PRN

## 2020-12-13 MED ORDER — HALOPERIDOL LACTATE 5 MG/ML IJ SOLN
0.5000 mg | INTRAMUSCULAR | Status: DC | PRN
  Administered 2020-12-14 (×2): 0.5 mg via INTRAVENOUS
  Filled 2020-12-13 (×2): qty 1

## 2020-12-13 MED ORDER — LORAZEPAM 2 MG/ML IJ SOLN: 1.0000 mg | INTRAMUSCULAR | Status: DC | PRN

## 2020-12-13 MED ORDER — LORAZEPAM 1 MG PO TABS: 1.0000 mg | ORAL_TABLET | ORAL | Status: DC | PRN

## 2020-12-13 MED ORDER — FENTANYL CITRATE (PF) 100 MCG/2ML IJ SOLN
25.0000 ug | INTRAMUSCULAR | Status: DC | PRN
  Administered 2020-12-15: 25 ug via INTRAVENOUS
  Filled 2020-12-13: qty 2

## 2020-12-13 MED ORDER — GLYCOPYRROLATE 0.2 MG/ML IJ SOLN
0.2000 mg | INTRAMUSCULAR | Status: DC | PRN
  Filled 2020-12-13: qty 1

## 2020-12-13 MED ORDER — BIOTENE DRY MOUTH MT LIQD: 15.0000 mL | OROMUCOSAL | Status: DC | PRN

## 2020-12-13 MED ORDER — ONDANSETRON 4 MG PO TBDP: 4.0000 mg | ORAL_TABLET | Freq: Four times a day (QID) | ORAL | Status: DC | PRN

## 2020-12-13 MED ORDER — GLYCOPYRROLATE 1 MG PO TABS: 1.0000 mg | ORAL_TABLET | ORAL | Status: DC | PRN

## 2020-12-13 MED ORDER — ONDANSETRON HCL 4 MG/2ML IJ SOLN: 4.0000 mg | Freq: Four times a day (QID) | INTRAMUSCULAR | Status: DC | PRN

## 2020-12-13 MED ORDER — HALOPERIDOL 0.5 MG PO TABS: 0.5000 mg | ORAL_TABLET | ORAL | Status: DC | PRN

## 2020-12-13 MED ORDER — POLYVINYL ALCOHOL 1.4 % OP SOLN
1.0000 [drp] | Freq: Four times a day (QID) | OPHTHALMIC | Status: DC | PRN
  Filled 2020-12-13: qty 15

## 2020-12-13 MED ORDER — LORAZEPAM 2 MG/ML PO CONC: 1.0000 mg | ORAL | Status: DC | PRN

## 2020-12-13 NOTE — Plan of Care (Signed)
  Problem: Coping: Goal: Level of anxiety will decrease Outcome: Progressing   Problem: Pain Managment: Goal: General experience of comfort will improve Outcome: Progressing   Problem: Safety: Goal: Ability to remain free from injury will improve Outcome: Progressing   Problem: Skin Integrity: Goal: Risk for impaired skin integrity will decrease Outcome: Progressing   

## 2020-12-13 NOTE — Progress Notes (Signed)
Family Medicine Teaching Service Daily Progress Note Intern Pager: [phone]  Patient name: [name] Medical record number: [id number] Date of birth: 1930-07-03 Age: 87 y.o. Gender: female  Primary Care Provider: Patient, No Pcp Per (Inactive) Consultants: Palliative  Code Status: DNR  Pt Overview and Major Events to Date:  4/21: Admitted   Assessment and Plan: Selena Spare Phillipsis a 86 y.o.femalepresenting with AMS. PMH is significant for dementia, CVA, HTN,depression and bipolar disorder.   AMS  Sepsis 2/2 to UTI History of dementia Lactic acid down trending from 5 to now 2.0. On admission, UA positive for large leukocytes and rare bacteria without nitrites or hemoglobin, 6-10 RBCs and >50 WBCs.CXR demonstrated no acute cardiopulmonary abnormality. CT head notable for no acute finding, chronic small vessel ischemia with notable brain atrophy which has progressed from 2021. Initially given azithromycin in ED for concern for pneumonia, likely infectious etiology is sepsis from urinary source which is likely causing her acute mental status changes in the setting of vascular dementia which may possibly be patient's new baseline.  -s/p 500 mg azithromycin and ceftriaxone 2 g (4/21). Continue ceftriaxone 1 g (4/22-) -100 mL/hr LR -up with assistance -delirium precautions -neuro checks qh4 -PT/OT eval and treat  Submassive PE  Acute hypoxic respiratory failure Mildly tachycardic on exam. Given significant increased oxygen requirement, concern for PE. CTA chest demonstrated acute pulmonary emboli with large and proximal clot burden with right heart strain.  -heparin per pharmacy  -continuous pulse ox  -wean O2 as tolerated  AKI in the setting of CKD stage 3b Resolved. Likely prerenal in the setting of poor oral intake leading to dehydration. -LR 100 mL/hr - avoid nephrotoxic agents  - monitor BMP  Leukocytosis Improved to 17.1, secondary to sepsis. CXR  demonstrated no acute cardiopulmonary abnormality.UA notable for large leukocytesand>50 WBC with rare bacteria. Thus far afebrile. Blood cultures noted to have no growth for 2 days. - pending urine cultures, culture reincubated for better growth  - continue to monitor clinically - vitals per routine  Irritated Gluteal Cleft  Erythematous patch remains remains unchanged since admission. Chronic given patient is wheelchair bound. -repositioning to avoid decubitus ulcer formation  Normocytic Anemia  Thrombocytopenia  Per nurse, patient has not been symptomatic. Hgb 9.8 and platelet count 129, hematochezia only possible known source of active bleeding which may be likely the cause. Noted to have another episode of hematochezia overnight. Per nurse, she has not had any more episodes since then.  -monitor  -monitor CBC -IV protonix 40 mg  -transfusion threshold 8   Elevated transaminases AST 26 wnl, ALT 48 and alkaline phosphatase 140. -am CMP  Elevated Troponin Troponins [id number], likely secondary to demand ischemia.  - am EKG - monitor clinically, low concern for cardiac etiology   Hypernatremia Resolved, likely in the setting of dehydration which has resolved with fluids.  - fluids as above - am CMP  Hyperglycemia Improved since admission, blood glucose 137 without known history of diabetes.  -monitor CMP  History of CVA Unknown of when CVA took place. Most recent CT head on 07/14/2021 demonstrated no acute intracranial abnormality or acute traumatic injury identified, stable noncontrast CT appearance of advanced chronic small vessel disease.CT head performed 4/22 demonstrated no acute finding, chronic small vessel ischemic with notable progressive brain atrophy. No home medications noted. -transfusion threshold 8  - PT/OT eval and treat  - neuro checks  Hypertension  Tachycardia Normotensive, BP 113/60. Known history of hypertension, home meds include  metoprolol 25 mg.  Overall persistently tachycardic, most recently 106.  -metoprolol 25 mg bid   Bipolar disorder  Depression Home meds include olanzapine 2.5mg , bupropion 150mg  and setraline 25mg . -hold home meds, while on clear liquid diet   Protein calorie malnutrition Albumin 2.1and total protein 4.5 this morning, secondary to inadequate oral intake. -clear liquid diet -encourage diet when able -nutrition consult placed  Goals of care Dr. Alba Cory has discussed with POA that patient would greatly benefit from goals of care discussion. POA was agreeable.  -palliative following, appreciate recommendations   FEN/GI: clear liquids  PPx: SCDs   Status is: Inpatient  Remains inpatient appropriate because:Inpatient level of care appropriate due to severity of illness   Dispo: The patient is from: ALF              Anticipated d/c is to: ALF              Patient currently is not medically stable to d/c.   Difficult to place patient No        Subjective:  Overnight, patient had episode of hematochezia. Obtained report from nurse who shares that patient seems to maintain consistent mental status since yesterday.   Objective: Temp:  [97.5 F (36.4 C)-98.3 F (36.8 C)] 97.6 F (36.4 C) (04/23 0747) Pulse Rate:  [104-120] 106 (04/23 0747) Resp:  [19-21] 20 (04/23 0747) BP: (113-158)/(60-88) 158/82 (04/23 0747) SpO2:  [98 %-100 %] 100 % (04/23 0747) Physical Exam: General: Patient sleeping comfortably in bed, in no acute distress. Cardiovascular: RRR, no murmurs or gallops auscultated  Respiratory: CTAB, no wheezing, rales or rhonchi, breathing comfortably on 2L O2 Abdomen: soft, nontender, BS+ Extremities: radial and distal pulses strong and equal bilaterally, mild pedal edema noted  Laboratory: Recent Labs  Lab 12/12/20 0036 12/12/20 1912 12/13/20 0159  WBC 21.0* 20.1* 17.1*  HGB 14.2 12.9 9.8*  HCT 45.5 41.2 31.6*  PLT 168 138* 129*   Recent  Labs  Lab 12/11/20 1138 12/12/20 0036 12/13/20 0159  NA 146* 147* 144  K 4.7 4.7 4.1  CL 111 109 110  CO2 23 28 30   BUN 52* 52* 42*  CREATININE 1.24* 1.01* 0.79  CALCIUM 8.9 8.6* 8.1*  PROT 6.3* 5.7* 4.5*  BILITOT 0.8 0.9 0.9  ALKPHOS 207* 173* 140*  ALT 74* 63* 48*  AST 35 27 26  GLUCOSE 241* 173* 137*      Imaging/Diagnostic Tests: No results found.  Donney Dice, DO 12/13/2020, 8:05 AM PGY-1, [place] Intern pager: [phone], text pages welcome

## 2020-12-13 NOTE — Progress Notes (Signed)
Daily Progress Note   Patient Name: [name]       Date: 12/13/2020 DOB: 01-30-1930  Age: 87 y.o. MRN#: [id number] Attending Physician: Zenia Resides, MD Primary Care Physician: Patient, No Pcp Per (Inactive) Admit Date: 12/11/2020  Reason for Consultation/Follow-up: Establishing goals of care, "new right heart strain causing dyspnea"  Subjective: Received reports from bedside RN - patient ate 50% of her lunch tray and drank a full can of boost supplement.   I spoke with HCPOA/Tena by phone. We again discussed the concept of a comfort path. Provided education and counseling on transitioning to comfort care while in the hospital, and what that would look like--keeping her clean and dry, no labs, no artificial hydration or feeding, no antibiotics, minimizing of medications, comfort feeds, as well as medication for pain, dyspnea, and anxiety as needed. Discussed that comfort care would also include stopping the heparin infusion, and that she would not be a good candidate for long-term anticoagulation due to history of frequent falls. Tena agrees with transition to full comfort measures.   Provided education and counseling on the philosophy and benefits of hospice care. Discussed that it offers a holistic approach to care in the setting of end-stage illness/disease, emphasizing the focus is on supporting the patient where they are while allowing nature to take it's course. Discussed that patient is likely not appropriate at this point for residential hospice due to her oral intake. However, she would be eligible to receive hospice care at [facility] and could transition to residential hospice later when her condition declines. Tena would prefer referral made to [facility] as they are operate the  hospice house in [place].    Length of Stay: 2  Current Medications: Scheduled Meds:  . metoprolol tartrate  25 mg Oral BID      Vital Signs: BP 129/65 (BP Location: Left Arm)   Pulse 88   Temp 97.9 F (36.6 C) (Oral)   Resp 20   Ht 5\' 2"  (1.575 m)   Wt 54.4 kg   SpO2 96%   BMI 21.95 kg/m  SpO2: SpO2: 96 % O2 Device: O2 Device: Nasal Cannula O2 Flow Rate: O2 Flow Rate (L/min): 2 L/min  Intake/output summary:   Intake/Output Summary (Last 24 hours) at 12/13/2020 1917 Last data filed at 12/13/2020 1715 Gross per 24  hour  Intake 3530.72 ml  Output 1500 ml  Net 2030.72 ml   LBM: Last BM Date: 12/11/20 Baseline Weight: Weight: 54.4 kg Most recent weight: Weight: 54.4 kg       Palliative Assessment/Data: PPS 30%      Palliative Care Assessment & Plan   HPI/Patient Profile: 87 y.o. female  with past medical history of dementia, CVA, HTN, depression, and bipolar disorder who presented to the emergency department on 12/11/2020 with altered mental status. On presentation was requiring 15L oxygen, tachycardic, and with respiratory rate as high as 43. Labs significant for leukocytosis of 17.9 and lactic acid of 5. UA on admission suspicious for UTI.  CT head negative for acute finsing, but showing chronic small vessel ischemia with notbale brain atrophy which has progressed from 2021. CTA chest showed acute pulmonary emboli with large and proximal clot burden and right heart strain consistent with at least submassive PE.  Admitted to FMTS with acute PE and sepsis secondary to possible UTI.  Assessment: - sepsis secondary to UTI - dementia with behavioral disturbance - acute hypoxic respiratory failure - submassive PE - AKI in the setting of CKD stage 3b - bipolar depression - protein calorie malnutrition  Recommendations/Plan:  Full comfort measures initiated  DNR/DNI as previously documented  Discontinue heparin infusion, antibiotics, and IV fluid  TOC consult  placed for referral to [place] - patient likely not appropriate for residential hospice at this point, will plan for discharge back to SNF with hospice  Added orders for symptom management at EOL as well as discontinued orders that were not focused on comfort  Unrestricted visitation orders were placed per current [place] EOL visitation policy   Provide frequent assessments and administer PRN medications as clinically necessary to ensure EOL comfort  PMT will continue to follow holistically  Goals of Care and Additional Recommendations:  Limitations on Scope of Treatment: Full Comfort Care  Code Status:  DNR/DNI  Prognosis:  Weeks to months, less than 6 months  Discharge Planning:  Likely SNF with hospice  Care plan was discussed with FMTS and bedside RN  Thank you for allowing the Palliative Medicine Team to assist in the care of this patient.   Total Time 35 minutes Prolonged Time Billed  no       Greater than 50%  of this time was spent counseling and coordinating care related to the above assessment and plan.  Lavena Bullion, NP  Please contact Palliative Medicine Team phone at [phone] for questions and concerns.

## 2020-12-13 NOTE — Evaluation (Signed)
Physical Therapy Evaluation Patient Details Name: [name] MRN: [id number] DOB: 25-Oct-1929 Today's Date: 12/13/2020   History of Present Illness  Pt is a 87 y.o. female presenting with AMS . Work up revealed PE, now on heparin, Sepsis 2/2 UTI, AKI in setting of CKD, leukocytosis.  PMH is significant for dementia, CVA, HTN, depression and bipolar disorder.  Clinical Impression  Patient presents with significant dependencies in gait and mobility, different from baseline.  Patient will benefit from PT to progress mobility and increase independence to reduce caregiver burden.  Recommend patient return to SNF to receive continued therapies.      Follow Up Recommendations SNF    Equipment Recommendations  Rolling walker with 5" wheels    Recommendations for Other Services       Precautions / Restrictions Precautions Precautions: Fall      Mobility  Bed Mobility Overal bed mobility: Needs Assistance Bed Mobility: Supine to Sit     Supine to sit: Max assist;+2 for physical assistance          Transfers Overall transfer level: Needs assistance   Transfers: Squat Pivot Transfers     Squat pivot transfers: Max assist;+2 safety/equipment     General transfer comment: attempted to stand with RW, patient unable to reach upright and/or raise bottom off bed; performed squat pivot transfer to chair  Ambulation/Gait                Stairs            Wheelchair Mobility    Modified Rankin (Stroke Patients Only)       Balance Overall balance assessment: Needs assistance Sitting-balance support: Bilateral upper extremity supported;Feet supported Sitting balance-Leahy Scale: Poor   Postural control: Posterior lean                                   Pertinent Vitals/Pain Pain Assessment: 0-10 Pain Score: 7  Pain Location: back Pain Descriptors / Indicators: Aching Pain Intervention(s): Limited activity within patient's  tolerance;Monitored during session;Repositioned    Home Living Family/patient expects to be discharged to:: Skilled nursing facility                 Additional Comments: unable to get good history due to dementia    Prior Function                 Hand Dominance        Extremity/Trunk Assessment        Lower Extremity Assessment Lower Extremity Assessment: Generalized weakness       Communication   Communication: No difficulties  Cognition Arousal/Alertness: Awake/alert Behavior During Therapy: Anxious Overall Cognitive Status: No family/caregiver present to determine baseline cognitive functioning                                 General Comments: history of cognitive deficits; not oriented to time, place or situtation.      General Comments      Exercises     Assessment/Plan    PT Assessment Patient needs continued PT services  PT Problem List Decreased strength;Decreased activity tolerance;Decreased balance;Decreased mobility;Decreased cognition;Decreased knowledge of use of DME       PT Treatment Interventions DME instruction;Gait training;Functional mobility training;Balance training;Therapeutic exercise;Therapeutic activities;Patient/family education    PT Goals (Current goals can be found in the Care Plan  section)  Acute Rehab PT Goals Patient Stated Goal: none stated PT Goal Formulation: Patient unable to participate in goal setting Time For Goal Achievement: 12/27/20 Potential to Achieve Goals: Fair    Frequency Min 3X/week   Barriers to discharge        Co-evaluation               AM-PAC PT "6 Clicks" Mobility  Outcome Measure Help needed turning from your back to your side while in a flat bed without using bedrails?: A Lot Help needed moving from lying on your back to sitting on the side of a flat bed without using bedrails?: A Lot Help needed moving to and from a bed to a chair (including a wheelchair)?: A  Lot Help needed standing up from a chair using your arms (e.g., wheelchair or bedside chair)?: Total Help needed to walk in hospital room?: Total Help needed climbing 3-5 steps with a railing? : Total 6 Click Score: 9    End of Session Equipment Utilized During Treatment: Gait belt Activity Tolerance: Patient limited by pain Patient left: in chair;with call bell/phone within reach;with chair alarm set   PT Visit Diagnosis: Other abnormalities of gait and mobility (R26.89);Muscle weakness (generalized) (M62.81)    Time: 1000-1030 PT Time Calculation (min) (ACUTE ONLY): 30 min   Charges:   PT Evaluation $PT Eval Moderate Complexity: 1 Mod PT Treatments $Therapeutic Activity: 8-22 mins        12/13/2020 Margie, PT Acute Rehabilitation Services Pager:  [phone] Office:  [phone]    Shanna Cisco 12/13/2020, 11:09 AM

## 2020-12-13 NOTE — Progress Notes (Signed)
Family Medicine Teaching Service Daily Progress Note Intern Pager: [phone]  Patient name: [name] Medical record number: [id number] Date of birth: 1930-02-07 Age: 87 y.o. Gender: female  Primary Care Provider: Patient, No Pcp Per (Inactive) Consultants: Palliative Code Status: DNR  Pt Overview and Major Events to Date:  4/21 - admitted for AMS 2/2 sepsis 4/23 - transitioned to comfort care  Assessment and Plan: Teosha Casso Phillipsis a 87 y.o.femalepresenting with AMS. PMH is significant for dementia, CVA, HTN,depression and bipolar disorder.   Goals of Care  Comfort Care  H/o Dementia Patient transition to comfort care 4/23.  Appreciate palliative care's assistance with this patient.  Plan is to discontinue all labs, artificial hydration/feeding, antibiotics and anticoagulation.  Goal is to minimize medications and provide pain/anxiety/dyspnea control.  Plan to receive hospice care at SNF.  -Palliative care consulted, greatly appreciate their help -DNI/DNR -Medications for symptom management -Unrestricted visitation -CSW to help arrange SNF placement - frequent repositioning - Delirium precuations - oxygen as needed - Pur  Other  AMS  Sepsis 2/2 UTI - antibiotics and fluids discontinued Submassive PE: anticoagulation discontinued CKD 3b - Chronic, no further monitoring Normocytic Anemia  Thrombocytopenia - no further work up or monitoring History of CVA Bipolar Disorder  Depression:  Protein Calorie Malnutrition: clear liquid diet, Boost  FEN/GI: CLD PPx: None  Disposition: SNF with hospice  Subjective:  No acute events overnight. Complained of dry eyes and urinary frequency without dysuria. She is holding urine due to fear of soiling the bed which is making her uncomfortable  Objective: Temp:  [97.6 F (36.4 C)-98 F (36.7 C)] 98 F (36.7 C) (04/23 2034) Pulse Rate:  [88-106] 105 (04/23 2034) Resp:  [20] 20 (04/23 2034) BP:  (129-158)/(55-82) 132/55 (04/23 2034) SpO2:  [96 %-100 %] 98 % (04/23 2034) Physical Exam: General: pleasant thin elderly female, lying comfortably in bed, in no acute distress with non-toxic appearance CV: regular rate and rhythm without murmurs, rubs, or gallops, trace lower extremity edema, 2+ radial and pedal pulses bilaterally Lungs: clear to auscultation bilaterally with normal work of breathing on 2L O2, speaking in full sentences Abdomen: soft, non-tender, non-distended, normoactive bowel sounds Skin: warm, dry  Laboratory: Recent Labs  Lab 12/12/20 1912 12/13/20 0159 12/13/20 1140  WBC 20.1* 17.1* 16.8*  HGB 12.9 9.8* 9.6*  HCT 41.2 31.6* 31.8*  PLT 138* 129* 142*   Recent Labs  Lab 12/11/20 1138 12/12/20 0036 12/13/20 0159  NA 146* 147* 144  K 4.7 4.7 4.1  CL 111 109 110  CO2 23 28 30   BUN 52* 52* 42*  CREATININE 1.24* 1.01* 0.79  CALCIUM 8.9 8.6* 8.1*  PROT 6.3* 5.7* 4.5*  BILITOT 0.8 0.9 0.9  ALKPHOS 207* 173* 140*  ALT 74* 63* 48*  AST 35 27 26  GLUCOSE 241* 173* 137*    Imaging/Diagnostic Tests: No results found.  Mina Marble [place], DO 12/14/2020, 6:52 AM PGY-3, [place] Intern pager: [phone], text pages welcome

## 2020-12-13 NOTE — Progress Notes (Signed)
[place] [facility] for Heparin  Indication: pulmonary embolus  Allergies  Allergen Reactions  . Anectine [Succinylcholine Chloride] Other (See Comments)    Reaction:  Patient states that her body wasn't functioning.  Pt states she was put on life support.   . Dilaudid [Hydromorphone Hcl] Other (See Comments)    Slept for 3 days and 3 nights  . Hydrocodone-Acetaminophen Other (See Comments)    Hallucinations, paranoia, amnesia Pt takes Tylenol at home  . Iodine Hives and Other (See Comments)    , asthma  . Naproxen Sodium Other (See Comments)    REACTION: Asthma  . Oxycodone Hcl Other (See Comments)     Hallucinations, paranoia, amnesia Pt takes Percocet at home with no issues  . Pentosan Polysulfate Sodium Other (See Comments)    REACTION: unspecified per patient  . Pseudoephedrine Other (See Comments)     Palpitations  . Succinylcholine     REACTION: "Can't wake up".  . Tape Dermatitis    Red inflammed  . Tramadol Hcl Other (See Comments)    Unknown reaction  . Shellfish Allergy     Runny nose, cough, hoarseness    Patient Measurements: Height: 5\' 2"  (157.5 cm) Weight: 54.4 kg (120 lb) IBW/kg (Calculated) : 50.1  Vital Signs: Temp: 97.6 F (36.4 C) (04/23 0747) Temp Source: Oral (04/23 0747) BP: 158/82 (04/23 0747) Pulse Rate: 106 (04/23 0747)  Labs: Recent Labs    12/11/20 1138 12/11/20 1343 12/11/20 1953 12/11/20 2305 12/12/20 0036 12/12/20 1556 12/12/20 1912 12/13/20 0159 12/13/20 1140  HGB 16.1*  --   --   --  14.2  --  12.9 9.8* 9.6*  HCT 51.9*  --   --   --  45.5  --  41.2 31.6* 31.8*  PLT 174  --   --   --  168  --  138* 129* 142*  APTT 27  --   --   --  28  --   --   --   --   LABPROT 14.9  --   --   --  15.5*  --   --   --   --   INR 1.2  --   --   --  1.2  --   --   --   --   HEPARINUNFRC  --   --   --   --   --  0.11*  --  0.26* 0.34  CREATININE 1.24*  --   --   --  1.01*  --   --  0.79  --   TROPONINIHS 277*  304* 241* 196*  --   --   --   --   --     Estimated Creatinine Clearance: 38.4 mL/min (by C-G formula based on SCr of 0.79 mg/dL).   Medical History: Past Medical History:  Diagnosis Date  . Adenomatous colon polyp 04/2005  . Allergic    Anectine  . ANXIETY 10/13/2007  . BACK PAIN 03/01/2007  . BREAST CANCER, HX OF 12/06/2007  . Cancer [name])    RIGHT mastectomy with node dissection  . Complication of anesthesia    family aggergy to annectine  . Compression fracture of L4 lumbar vertebra 02/2013  . CONSTIPATION 01/06/2009  . Dementia ([place]) 2020   Per POA  . DEPRESSION 10/13/2007  . Diverticular disease   . DIVERTICULOSIS, COLON 05/11/2005  . External hemorrhoids without mention of complication 0/81/4481  . FATIGUE 08/07/2008  . HEMORRHOIDS, INTERNAL  05/11/2005  . Hypercalcemia 04/23/2009  . HYPERLIPIDEMIA NEC/NOS 03/01/2007  . HYPERTENSION 10/13/2007  . Impaired fasting glucose 11/14/2008  . INTERSTITIAL CYSTITIS 09/06/2007  . Irritable bowel syndrome 01/06/2009  . LIVER MASS 05/15/2009  . OSTEOPOROSIS 05/09/2009  . PARESTHESIA 09/06/2007  . POLYCYTHEMIA 04/23/2009  . STENOSIS, SPINAL, UNSPC REGION 03/01/2007  . Thyroid disease    Assessment: 87 y/o F with new-onset bilateral pulmonary embolus with RHS, continuing on heparin. Patient is not on anticoagulation PTA.   Afternoon CBC wnl. HL 0.3 which is at goal. Will continue current rate and check another level in 8 hours. RN notes patient with hemorrhoids and minor bleeding is stable. She will notify if worsens. No issues with infusion per discussion with RN.  Goal of Therapy:  Heparin level 0.3-0.7 units/ml Monitor platelets by anticoagulation protocol: Yes   Plan:  Continue heparin drip to 1150 units/hr Check 8hr heparin level Monitor daily CBC, s/sx bleeding   Alfonse Spruce, PharmD PGY2 ID Pharmacy Resident Phone between 7 am - 3:30 pm: [phone]  Please check AMION for all [place] phone numbers After 10:00 PM, call [place] [phone]  12/13/2020 12:32 PM

## 2020-12-13 NOTE — Progress Notes (Signed)
[facility] for Heparin  Indication: pulmonary embolus  Allergies  Allergen Reactions  . Anectine [Succinylcholine Chloride] Other (See Comments)    Reaction:  Patient states that her body wasn't functioning.  Pt states she was put on life support.   . Dilaudid [Hydromorphone Hcl] Other (See Comments)    Slept for 3 days and 3 nights  . Hydrocodone-Acetaminophen Other (See Comments)    Hallucinations, paranoia, amnesia Pt takes Tylenol at home  . Iodine Hives and Other (See Comments)    , asthma  . Naproxen Sodium Other (See Comments)    REACTION: Asthma  . Oxycodone Hcl Other (See Comments)     Hallucinations, paranoia, amnesia Pt takes Percocet at home with no issues  . Pentosan Polysulfate Sodium Other (See Comments)    REACTION: unspecified per patient  . Pseudoephedrine Other (See Comments)     Palpitations  . Succinylcholine     REACTION: "Can't wake up".  . Tape Dermatitis    Red inflammed  . Tramadol Hcl Other (See Comments)    Unknown reaction  . Shellfish Allergy     Runny nose, cough, hoarseness    Patient Measurements: Height: 5\' 2"  (157.5 cm) Weight: 54.4 kg (120 lb) IBW/kg (Calculated) : 50.1  Vital Signs: Temp: 98 F (36.7 C) (04/22 1950) Temp Source: Oral (04/22 1950) BP: 150/88 (04/22 1950) Pulse Rate: 110 (04/22 1950)  Labs: Recent Labs    12/11/20 1138 12/11/20 1343 12/11/20 1953 12/11/20 2305 12/12/20 0036 12/12/20 1556 12/12/20 1912 12/13/20 0159  HGB 16.1*  --   --   --  14.2  --  12.9 9.8*  HCT 51.9*  --   --   --  45.5  --  41.2 31.6*  PLT 174  --   --   --  168  --  138* 129*  APTT 27  --   --   --  28  --   --   --   LABPROT 14.9  --   --   --  15.5*  --   --   --   INR 1.2  --   --   --  1.2  --   --   --   HEPARINUNFRC  --   --   --   --   --  0.11*  --  0.26*  CREATININE 1.24*  --   --   --  1.01*  --   --   --   TROPONINIHS 277* 304* 241* 196*  --   --   --   --     Estimated  Creatinine Clearance: 30.5 mL/min (A) (by C-G formula based on SCr of 1.01 mg/dL (H)).   Assessment: 87 y.o. female with PE for heparin Goal of Therapy:  Heparin level 0.3-0.7 units/ml Monitor platelets by anticoagulation protocol: Yes   Plan:  Increase Heparin 1150 units/hr Check heparin level in 8 hours.   Caryl Pina  12/13/2020 3:55 AM

## 2020-12-13 NOTE — Progress Notes (Signed)
Occupational Therapy Evaluation Patient Details Name: [name] MRN: [id number] DOB: 30-Nov-1929 Today's Date: 12/13/2020    History of Present Illness Pt is a 87 y.o. female presenting with AMS . Work up revealed PE, now on heparin, Sepsis 2/2 UTI, AKI in setting of CKD, leukocytosis.  PMH is significant for dementia, CVA, HTN, depression and bipolar disorder.   Clinical Impression   Pt pleasant but limited in tolerance and ability for participation. Oriented only to self, presenting with reports of back pain and self limiting behaviors most likely d/t cognitive status and insight. Grossly pt presents with decreased activity tolerance, balance, strength and cognition leading to need for extensive assist for repositioning and all bed mobility transfers. Tolerated brief transition to EOB with max-dep of +1 this date, with near immediate request and return to supine. Dep for repositioning with HOB elevated for improved posture at bed level. Pt demo's ability to participation with ADL's at bed level but consistently max A for full completion oat this time. Limited ability to gain PLOF or social from previous admission notes or pt report. Anticipate pt to require low intensity post acute therapy at time of d/c, OT will continue to follow acutely.     Follow Up Recommendations  SNF;Supervision/Assistance - 24 hour    Equipment Recommendations  Other (comment) (TBd)    Recommendations for Other Services    Precautions / Restrictions Precautions Precautions: Fall Precaution Comments: IV, ?cognition, falls, heparin    Mobility Bed Mobility Overal bed mobility: Needs Assistance Bed Mobility: Supine to Sit     Supine to sit: Max assist;HOB elevated          Transfers Overall transfer level: Needs assistance   Transfers: Squat Pivot Transfers     Squat pivot transfers: Max assist;+2 safety/equipment     General transfer comment: attempted to stand with RW, patient unable to  reach upright and/or raise bottom off bed; performed squat pivot transfer to chair    Balance Overall balance assessment: Needs assistance Sitting-balance support: Bilateral upper extremity supported;Feet supported Sitting balance-Leahy Scale: Poor   Postural control: Posterior lean                                 ADL either performed or assessed with clinical judgement   ADL Overall ADL's : Needs assistance/impaired                                       General ADL Comments: unable to gain accurate PLOF d/t current cognitive status, family/support not present to provide or confirm social hx or PLOF     Vision Baseline Vision/History: Wears glasses       Perception     Praxis      Pertinent Vitals/Pain Pain Assessment: 0-10 Pain Score: 4  Pain Location: back Pain Descriptors / Indicators: Aching;Guarding Pain Intervention(s): Limited activity within patient's tolerance     Hand Dominance     Extremity/Trunk Assessment Upper Extremity Assessment Upper Extremity Assessment: Generalized weakness   Lower Extremity Assessment Lower Extremity Assessment: Generalized weakness       Communication Communication Communication: No difficulties   Cognition Arousal/Alertness: Awake/alert Behavior During Therapy: Anxious Overall Cognitive Status: No family/caregiver present to determine baseline cognitive functioning  General Comments: noted pt with dementia listed in chart, oriented to self only, decreased insight.   General Comments  edema present to B LE's, primarily feet    Exercises     Shoulder Instructions      Home Living Family/patient expects to be discharged to:: Skilled nursing facility                                 Additional Comments: unable to get good history due to dementia      Prior Functioning/Environment                   OT Problem List:  Decreased strength;Decreased activity tolerance;Impaired balance (sitting and/or standing);Decreased cognition;Decreased safety awareness;Decreased knowledge of use of DME or AE;Decreased knowledge of precautions      OT Treatment/Interventions: Self-care/ADL training;Therapeutic exercise;DME and/or AE instruction;Neuromuscular education;Therapeutic activities;Manual therapy;Cognitive remediation/compensation;Patient/family education;Balance training    OT Goals(Current goals can be found in the care plan section) Acute Rehab OT Goals Patient Stated Goal: to leave OT Goal Formulation: Patient unable to participate in goal setting Time For Goal Achievement: 12/27/20 Potential to Achieve Goals: Fair ADL Goals Pt Will Perform Upper Body Dressing: with set-up Pt Will Perform Lower Body Dressing: with set-up Pt Will Transfer to Toilet: with supervision  OT Frequency: Min 2X/week   Barriers to D/C:    unclear of PLOF or social support at time of d/c       Co-evaluation              AM-PAC OT "6 Clicks" Daily Activity     Outcome Measure Help from another person eating meals?: A Little Help from another person taking care of personal grooming?: A Little Help from another person toileting, which includes using toliet, bedpan, or urinal?: Total Help from another person bathing (including washing, rinsing, drying)?: A Lot Help from another person to put on and taking off regular upper body clothing?: A Lot Help from another person to put on and taking off regular lower body clothing?: A Lot 6 Click Score: 13   End of Session Nurse Communication: Other (comment) (status of pt at end of session)  Activity Tolerance: Patient limited by fatigue Patient left: in bed;with bed alarm set  OT Visit Diagnosis: Unsteadiness on feet (R26.81);Muscle weakness (generalized) (M62.81);Repeated falls (R29.6)                Time: 5056-9794 OT Time Calculation (min): 18 min Charges:  OT General  Charges $OT Visit: 1 Visit OT Evaluation $OT Eval Moderate Complexity: 1 Mod  Natascha Edmonds OTR/L acute rehab services Office: [phone]  12/13/2020, 12:36 PM

## 2020-12-13 NOTE — Progress Notes (Signed)
FPTS Interim Progress Note Spoke with Gregary Signs from the palliative care team who spoke to [place] (health care POA) regarding goals of care. It was discussed that patient would be placed in comfort care and then transitioned to hospice Monday. But per Gregary Signs, patient is not agreeable to this plan. Appreciate continued involvement and assistance from palliative care team.    Donney Dice, DO 12/13/2020, 11:25 AM PGY-1, [facility] Service pager [phone]

## 2020-12-13 NOTE — Progress Notes (Signed)
FPTS Interim Progress Note  Went to see patient at bedside to determine presence of mental status changes, patient awake and alert to voice. She is able to tell me her name. She states that she is at [facility], able to tell me that she is in [place]. Thinks it is 31. States that she does not know Amy and that Otila Kluver is the most efficient lawyer that she know. Both Amy and Otila Kluver are patient's caregivers at the assisted living facility where patient arrived from. When asked about how she thinks she is doing, she states that she likes the food here and that she has a whole new outlook on food now since being in the hospital. She continues to exhibit nonsensical speech, I believe that given this patient does not maintain capacity at this time. This may possibly be patient's new baseline from acute changes caused by sepsis in the setting of history of vascular dementia.   Donney Dice, DO 12/13/2020, 1:36 PM PGY-1, [facility] Service pager [phone]

## 2020-12-14 DIAGNOSIS — R339 Retention of urine, unspecified: Secondary | ICD-10-CM | POA: Diagnosis not present

## 2020-12-14 DIAGNOSIS — A419 Sepsis, unspecified organism: Secondary | ICD-10-CM | POA: Diagnosis not present

## 2020-12-14 DIAGNOSIS — I2699 Other pulmonary embolism without acute cor pulmonale: Secondary | ICD-10-CM | POA: Diagnosis not present

## 2020-12-14 NOTE — TOC Progression Note (Addendum)
Transition of Care [facility]) - Progression Note    Patient Details  Name: [name] MRN: [id number] Date of Birth: Aug 12, 1930  Transition of Care [facility]) CM/SW Contact  Ina Homes, [place] Phone Number: 12/14/2020, 10:57 AM  Clinical Narrative:     SW left VM with Audrea Muscat ([facility] [phone]) requesting callback.  Update 1120am SW spoke with Audrea Muscat [facility]) will f/u with pt/family and plan to start to services when pt returns to [facility]  Update 130pm SW attempted x2 to reach [facility] [phone]) but unsuccessful and unable to leave VM.  Expected Discharge Plan: Skilled Nursing Facility Barriers to Discharge: Continued Medical Work up  Expected Discharge Plan and Services Expected Discharge Plan: [place]  Social Determinants of Health (SDOH) Interventions    Readmission Risk Interventions No flowsheet data found.

## 2020-12-14 NOTE — Progress Notes (Addendum)
It has been noted that patient has been bladder scanned and I&O cathed >3 times, often for amounts >500 ml.  Requested from MD for foley cath to be placed for comfort after conferring with charge nurse, Manuela Schwartz.

## 2020-12-14 NOTE — Progress Notes (Signed)
Daily Progress Note   Patient Name: [name]       Date: 12/14/2020 DOB: [birth year]  Age: 87 y.o. MRN#: [id number] Attending Physician: Zenia Resides, MD Primary Care Physician: Patient, No Pcp Per (Inactive) Admit Date: 12/11/2020  Reason for Consultation/Follow-up: Establishing goals of care, "new right heart strain causing dyspnea"  Subjective: 12:55--Patient appears to be sleeping, but opens her eyes when I enter the room. When asked how she is feeling, she states "I feel pretty good now". Per MAR she received a dose of PRN haldol earlier this morning. She denies pain or shortness of breath, but endorses poor appetite.   20:00--Spoke with friend/HCPOA Tena by phone and let her know patient seemed to be feeling better since transitioning to comfort care. Confirmed plan to discharge back to SNF ([facility]) with hospice care, with option to transfer to residential hospice when her condition declined.   Length of Stay: 3  Current Medications: Scheduled Meds:  . metoprolol tartrate  25 mg Oral BID      PRN Meds: antiseptic oral rinse, feeding supplement, fentaNYL (SUBLIMAZE) injection, glycopyrrolate **OR** glycopyrrolate **OR** glycopyrrolate, haloperidol **OR** haloperidol **OR** haloperidol lactate, LORazepam **OR** LORazepam **OR** LORazepam, ondansetron **OR** ondansetron (ZOFRAN) IV, polyvinyl alcohol            Vital Signs: BP (!) 132/55 (BP Location: Left Arm)   Pulse (!) 105   Temp 98 F (36.7 C) (Oral)   Resp 20   Ht 5\' 2"  (1.575 m)   Wt 54.4 kg   SpO2 98%   BMI 21.95 kg/m  SpO2: SpO2: 98 % O2 Device: O2 Device: Nasal Cannula O2 Flow Rate: O2 Flow Rate (L/min): 2 L/min  Intake/output summary:   Intake/Output Summary (Last 24 hours) at 12/14/2020  1255 Last data filed at 12/14/2020 0800 Gross per 24 hour  Intake 180 ml  Output 850 ml  Net -670 ml   LBM: Last BM Date: 12/13/20 Baseline Weight: Weight: 54.4 kg Most recent weight: Weight: 54.4 kg       Palliative Assessment/Data: PPS 30%       Palliative Care Assessment & Plan    HPI/Patient Profile:87 y.o.femalewith past medical history of dementia, CVA, HTN, depression, and bipolar disorder who presented to the emergency departmenton4/21/2022with altered mental status.On presentation was requiring 15L oxygen, tachycardic, and  with respiratory rate as high as 43. Labs significant for leukocytosis of 17.9 and lactic acid of 5. UA on admission suspicious for UTI. CT head negative for acute finsing, but showing chronic small vessel ischemia with notbale brain atrophy which has progressed from 2021. CTA chest showed acute pulmonary emboli with large and proximal clot burden and right heart strain consistent with at least submassive PE.  Admitted to FMTS withacute PE andsepsis secondary to possible UTI.  Assessment: - sepsis secondary to UTI - dementia with behavioral disturbance - acute hypoxic respiratory failure - submassive PE - AKI in the setting of CKD stage 3b - bipolar depression - protein calorie malnutrition  Recommendations/Plan:  Continue full comfort measures  DNR/DNI as previously documented   Pending discharge back to SNF with hospice  Patient will need comfort meds at discharge, would recommend: - ativan 1 mg oral tablet every 4 hours PRN anxiety - morphine concentrate solution 5 mg SL every 4 hours PRN pain or dyspnea - haldol 0.5 mg oral tablet every 4 hours PRN agitation  Goals of Care and Additional Recommendations:  Limitations on Scope of Treatment: Full Comfort Care  Code Status:  DNR/DNI  Prognosis:  Weeks to months, less than 6 months  Discharge Planning:  [place] with Hospice   Thank you for allowing  the Palliative Medicine Team to assist in the care of this patient.   Total Time 20 minutes Prolonged Time Billed  no       Greater than 50%  of this time was spent counseling and coordinating care related to the above assessment and plan.  Lavena Bullion, NP  Please contact Palliative Medicine Team phone at [phone] for questions and concerns.

## 2020-12-14 NOTE — Progress Notes (Signed)
Manufacturing engineer [facility]) Hospital Liaison: RN note    Notified by Transition of Care Manger of patient/family request for [facility] services at [facility] after discharge. Chart and patient information under review by [facility] physician. Hospice eligibility pending currently.    Writer spoke with HCPOA, Tena to initiate education related to hospice philosophy, services and team approach to care. Tena verbalized understanding of information given.  Please send signed and completed DNR form home with patient/family. Patient will need prescriptions for discharge comfort medications.   [facility] aware of the above. Please notify [facility] when patient is ready to leave the unit at discharge. (Call [phone] or [phone] after 5pm.) [facility] information and contact numbers given to [facility].      A Please do not hesitate to call with questions.    Thank you,   Farrel Gordon, RN, [place] (listed on [facility] under [facility])    [phone]

## 2020-12-14 NOTE — Progress Notes (Signed)
Called by RN, bladder san greater than 350cc.  Last scanned at 1700 and straight cath for 250 cc urine.  RN reports abdominal discomfort.  Patient is comfort care.   -In and out cath now -Bladder scans had previously been discontinued.  No need to continue scanning   Carollee Leitz, MD [facility] Medicine Residency

## 2020-12-14 NOTE — Progress Notes (Signed)
Placed order for foley catheter. Even though previous physician notes indicated discontinuance of continued bladder scans, RN felt it appropriate to bladder scan when patient was agitated and holding her abdomen. Has scanned several times and straight cathed multiple times today for volumes 350mL and higher. RN requests foley for patient comfort. RN does not believe patient can urinate using pure wick and needs foley. Night physician team and attending had relayed that [name] told them she didn't want to use a pure wick because she was worried about "messing the bed". Apparently toilets well at SNF. RN reports the patient is too weak and confused at this time to toilet with assistance. Hopeful for discharge soon to SNF with hospice, likely tomorrow 4/25 due to SW difficulty getting in touch with SNF today.   Ezequiel Essex, MD

## 2020-12-15 DIAGNOSIS — Z789 Other specified health status: Secondary | ICD-10-CM

## 2020-12-15 DIAGNOSIS — Z515 Encounter for palliative care: Secondary | ICD-10-CM

## 2020-12-15 DIAGNOSIS — Z66 Do not resuscitate: Secondary | ICD-10-CM

## 2020-12-15 LAB — RESP PANEL BY RT-PCR (FLU A&B, COVID) ARPGX2
Influenza A by PCR: NEGATIVE
Influenza B by PCR: NEGATIVE
SARS Coronavirus 2 by RT PCR: NEGATIVE

## 2020-12-15 LAB — SARS CORONAVIRUS 2 (TAT 6-24 HRS): SARS Coronavirus 2: NEGATIVE

## 2020-12-15 MED ORDER — MORPHINE SULFATE (CONCENTRATE) 5 MG/0.25ML PO SOLN: 5.0000 mg | ORAL | 0 refills | Status: DC | PRN

## 2020-12-15 MED ORDER — HALOPERIDOL 0.5 MG PO TABS: 0.5000 mg | ORAL_TABLET | ORAL | Status: AC | PRN

## 2020-12-15 MED ORDER — LORAZEPAM 1 MG PO TABS: 1.0000 mg | ORAL_TABLET | ORAL | 0 refills | Status: DC | PRN

## 2020-12-15 MED ORDER — ONDANSETRON 4 MG PO TBDP: 4.0000 mg | ORAL_TABLET | Freq: Four times a day (QID) | ORAL | 0 refills | Status: AC | PRN

## 2020-12-15 NOTE — TOC Transition Note (Signed)
Transition of Care [facility]) - CM/SW Discharge Note   Patient Details  Name: [name] MRN: [id number] Date of Birth: [birth year]  [facility]) CM/SW Contact:  Coralee Pesa, [place] Phone Number: 12/15/2020, 12:03 PM   Clinical Narrative:    Pt to be transported via PTAR to [facility]. Nurse to call report to [phone] Ask for [place] nurse.   Final next level of care: [place] Barriers to Discharge: Barriers Resolved   Patient Goals and CMS Choice        Discharge Placement              Patient chooses bed at: [facility] Patient to be transferred to facility by: [place] Name of family member notified: Tena Ghalager- [place] Patient and family notified of of transfer: 12/15/20  Discharge Plan and Services                                     Social Determinants of Health (SDOH) Interventions     Readmission Risk Interventions No flowsheet data found.

## 2020-12-15 NOTE — Progress Notes (Signed)
[facility])        This patient has been referred for hospice services at [facility].  ACC will continue to follow for any discharge planning needs and to coordinate admission onto hospice services.     Thank you for the opportunity to participate in this patient's care.     Domenic Moras, BSN, RN [facility] Liaison [phone] (24h on call)

## 2020-12-15 NOTE — TOC Progression Note (Signed)
Transition of Care [facility]) - Progression Note    Patient Details  Name: CINTHYA BORS MRN: [id number] Date of Birth: [birth year]  Transition of Care [facility]) CM/SW [place], [place] Phone Number: 12/15/2020, 4:35 PM  Clinical Narrative:     CSW noted that Covid test is still pending at this time. CSW put pt on PTAR list, but was told it would be "a very very long wait". CSW reached out to pt's POA, who noted she would not like a pt moved at a very late hour, and is ok if DC is tomorrow. CSW updated the MD, nurse, and the facility. CSW will cancel transport at this time. SW will continue to follow.  Expected Discharge Plan: Skilled Nursing Facility Barriers to Discharge: Barriers Resolved  Expected Discharge Plan and Services Expected Discharge Plan: [place]         Expected Discharge Date: 12/15/20                                     Social Determinants of Health (SDOH) Interventions    Readmission Risk Interventions No flowsheet data found.

## 2020-12-15 NOTE — Progress Notes (Signed)
Brief Palliative Medicine Progress Note:  I was notified PMT received phone call from patient's friend/Tena expressing concerns that hospice would not be able to follow patient back at [facility] due to receiving skilled services.   Chart review performed.  Called and discussed patient's case and received updates from LCSW.  LCSW has resolved the miscommunication between [facility] and hospice - plan is still for patient to return to [facility] with hospice services (she will not be receiving skilled services).  Per LCSW, patient was originally living in a locked dementia unit but now due to her nonambulatory status does not qualify for this unit, she will be moved to regular floor where patient can receive hospice services.  LCSW states that she will be calling Tena to provide updates and address concerns.  Reviewed discharge medications to ensure end-of-life symptom management medications are available at discharge.  Thank you for allowing PMT to assist in the care of this patient.  Aileana Hodder M. Tamala Julian, FNP-BC Palliative Medicine Team Team Phone: [phone] 15 minutes  Greater than 50%  of this time was spent counseling and coordinating care related to the above assessment and plan.  *Portions of this note are a verbal dictation therefore any spelling and/or grammatical errors are due to the "[employer] [employer]" system interpretation.

## 2020-12-15 NOTE — Progress Notes (Signed)
Family Medicine Teaching Service Daily Progress Note Intern Pager: [phone]  Patient name: [name] Medical record number: [id number] Date of birth: 1930/01/01 Age: 87 y.o. Gender: female  Primary Care Provider: Patient, No Pcp Per (Inactive) Consultants: palliative care   Code Status: DNR  Pt Overview and Major Events to Date:  4/21 - admitted for AMS 2/2 sepsis 4/23 - transitioned to comfort care  Assessment and Plan: SIEARRA AMBERG is a 87 y.o. female presenting with AMSsecondary to urosepsis and submassive PE. PMH is significant for dementia, CVA, HTN,depression and bipolar disorder.   Goals of care Patient was transitioned to comfort care on 4/23.  Discontinuing all labs, artificial hydration/feeding, antibiotics, and anticoagulation.  Focus is on symptom control.  She will be receiving hospice care at SNF. -Palliative care consulted, greatly appreciate their help -DNI/DNR -Medications for symptom management -Unrestricted visitation -CSW to help arrange SNF placement - frequent repositioning - Delirium precuations - oxygen as needed  AMS  Sepsis 2/2 UTI - antibiotics and fluids discontinued Submassive PE: anticoagulation discontinued CKD 3b - Chronic, no further monitoring Normocytic Anemia  Thrombocytopenia - no further work up or monitoring History of CVA Bipolar Disorder  Depression:  Protein Calorie Malnutrition: Boost  FEN/GI: regular diet PPx: none  Disposition: SNF with hospice, plan to d/c today but unable due to transport issues, will plan to d/c tomorrow  Subjective:  NAOE.  Reports feeling some discomfort in her back due to sitting for long period time.  Reports breathing comfortably.  No other concerns at this time.  Objective: Temp:  [97.4 F (36.3 C)-99.4 F (37.4 C)] 97.4 F (36.3 C) (04/25 0831) Pulse Rate:  [96-108] 96 (04/25 0831) Resp:  [17-18] 17 (04/25 0831) BP: (124-143)/(56-59) 143/59 (04/25 0831) SpO2:  [98 %-99 %]  98 % (04/25 0831) Physical Exam: General: Alert, thin elderly female lying comfortably in bed, NAD Cardiovascular: Mildly tachycardic, regular rhythm, no murmurs Respiratory: Clear to auscultation bilaterally, breathing comfortably on 2 L nasal cannula, speaking in full sentences Abdomen: Soft, nontender, positive bowel sounds Extremities: WWP, no edema  Laboratory: Recent Labs  Lab 12/12/20 1912 12/13/20 0159 12/13/20 1140  WBC 20.1* 17.1* 16.8*  HGB 12.9 9.8* 9.6*  HCT 41.2 31.6* 31.8*  PLT 138* 129* 142*   Recent Labs  Lab 12/11/20 1138 12/12/20 0036 12/13/20 0159  NA 146* 147* 144  K 4.7 4.7 4.1  CL 111 109 110  CO2 23 28 30   BUN 52* 52* 42*  CREATININE 1.24* 1.01* 0.79  CALCIUM 8.9 8.6* 8.1*  PROT 6.3* 5.7* 4.5*  BILITOT 0.8 0.9 0.9  ALKPHOS 207* 173* 140*  ALT 74* 63* 48*  AST 35 27 26  GLUCOSE 241* 173* 137*     Imaging/Diagnostic Tests: No new imaging.  Zola Button, MD 12/15/2020, 5:14 PM PGY-1, [place] Intern pager: [phone], text pages welcome

## 2020-12-15 NOTE — Progress Notes (Signed)
Speech Language Pathology Discharge Patient Details Name: [name] MRN: [id number] DOB: October 20, 1929 Today's Date: 12/15/2020 Time:  -     Patient discharged from SLP services secondary to: comfort care and discharge hospice tomorrow   Please see latest therapy progress note for current level of functioning and progress toward goals.    Progress and discharge plan discussed with patient and/or caregiver: no       Houston [name] 12/15/2020, 4:41 PM

## 2020-12-15 NOTE — Discharge Summary (Addendum)
[facility] Discharge Summary  Patient name: [name] Medical record number: [id number] Date of birth: 1930-06-21 Age: 87 y.o. Gender: female Date of Admission: 12/11/2020  Date of Discharge: 12/16/2020  Admitting Physician: Donney Dice, DO  Primary Care Provider: Patient, No Pcp Per (Inactive) Consultants: palliative care  Indication for Hospitalization: Altered mental status  Discharge Diagnoses/Problem List:  Active Problems:   Altered mental status   Acute cystitis with hematuria   Sepsis ([place])   Acute respiratory failure with hypoxia ([place])   Pulmonary embolism and infarction ([place])   AKI (acute kidney injury) ([place])   Dehydration   Moderate protein-calorie malnutrition ([place])   Hospice care patient   Palliative care patient    Disposition: SNF with hospice  Discharge Condition: Stable  Discharge Exam:  General: Alert, thin elderly female resting comfortably in bed, NAD CV: Mildly tachycardic, regular rhythm, no murmurs Pulm: Clear to auscultation bilaterally, breathing comfortably on room air, speaking in full sentences Abd: Soft, nontender, positive bowel sounds Ext: Warm and well perfused, no edema Neuro: A&Ox2 (oriented to self and location, stated year was 1993)   Brief Hospital Course:  [name] is an 87 year old female who presented with AMS with hx of CVA, dementia, hyperparathyroidism, hx of breast cancer, HTN and HLD.   AMS likely 2/2 to Urosepsis Patient presented to ED with several days of decreased PO intake and change in mental status. Vitals were notable for new oxygen requirement as well as increased respiratory rate and tachycardia. Her temperature was elevated to 100.0.  Labs were notable for large leukocytes on urine studies, LA of 5, Cr of 1.24, glucose of 241, and leukocytosis of 17.9. She was treated with Azithromycin and CTX for presumed urosepsis. Urine culture ultimately grew 20,000 colonies of  Aerococcus species and 1000 colonies of ESBL E. coli. She was transitioned to comfort care on 4/23, so antibiotics were discontinued.  Submassive PE with right heart strain and acute hypoxemic respiratory failure Patient was noted to be tachycardic, tachypneic, and hypoxemic requiring supplemental oxygen.  CTPA demonstrated acute pulmonary emboli with with large and proximal clot burden with right heart strain.  She was started on anticoagulation with heparin drip, but this was discontinued on 4/23 when she was made comfort care.  She was breathing comfortably on room air on discharge.  Elevated Troponin  Patient's labs on admission noted elevated troponin of 277>304>241>196 EKG did not show changes. Likely due to demand ischemia.   AKI on CKD stage IIIb Initially with elevated creatinine likely prerenal in the setting of poor oral intake.  She was treated with IV fluids with improvement in her creatinine.  IV fluids were discontinued on 4/23 when she was made comfort care.  Hematochezia with normocytic anemia Patient had a few episodes of hematochezia during admission with drop in her hemoglobin in the setting of starting anticoagulation, though she did not require any blood transfusions.  Most recent hemoglobin 9.6 prior to discharge.  Lab draws were discontinued on 4/23 when she was made comfort care.  Irritated gluteal cleft Patient was noted to have a erythematous patch on her gluteal cleft unchanged since admission, deemed chronic given wheelchair-bound status.  Goals of care Patient lacking capacity throughout admission. Palliative consulted to discuss goals of care. Palliative team spoke with healthcare POA, Otila Kluver, who desired comfort care for patient.   All other issues chronic and stable.   Issues for follow up: 1. Recommend frequent repositioning to avoid decubitus ulcer  formation especially in her sacrum.   Significant Procedures: none  Significant Labs and Imaging:  Recent  Labs  Lab 12/12/20 1912 12/13/20 0159 12/13/20 1140  WBC 20.1* 17.1* 16.8*  HGB 12.9 9.8* 9.6*  HCT 41.2 31.6* 31.8*  PLT 138* 129* 142*   Recent Labs  Lab 12/11/20 1138 12/12/20 0036 12/13/20 0159  NA 146* 147* 144  K 4.7 4.7 4.1  CL 111 109 110  CO2 23 28 30   GLUCOSE 241* 173* 137*  BUN 52* 52* 42*  CREATININE 1.24* 1.01* 0.79  CALCIUM 8.9 8.6* 8.1*  MG  --  2.5*  --   ALKPHOS 207* 173* 140*  AST 35 27 26  ALT 74* 63* 48*  ALBUMIN 2.9* 2.7* 2.1*    CT HEAD WO CONTRAST  Result Date: 12/12/2020 CLINICAL DATA:  Head trauma, minor.  Elevated D-dimer EXAM: CT HEAD WITHOUT CONTRAST TECHNIQUE: Contiguous axial images were obtained from the base of the skull through the vertex without intravenous contrast. COMPARISON:  07/14/2020 FINDINGS: Brain: No evidence of acute infarction, hemorrhage, hydrocephalus, extra-axial collection or mass lesion/mass effect. Brain atrophy with confluent chronic small vessel ischemia in the periventricular white matter. Chronic lacunar infarcts in the bilateral thalamus. When measuring the dilated lateral ventricles, there has been progressive atrophy since 2021. Vascular: No hyperdense vessel or unexpected calcification. Skull: Normal. Negative for fracture or focal lesion. Sinuses/Orbits: No acute finding. IMPRESSION: 1. No acute finding. 2. Notable brain atrophy which has progressed from 2021. 3. Chronic small vessel ischemia. Electronically Signed   By: Monte Fantasia M.D.   On: 12/12/2020 06:17   CT ANGIO CHEST PE W OR WO CONTRAST  Result Date: 12/12/2020 CLINICAL DATA:  Head trauma and elevated D-dimer. EXAM: CT ANGIOGRAPHY CHEST WITH CONTRAST TECHNIQUE: Multidetector CT imaging of the chest was performed using the standard protocol during bolus administration of intravenous contrast. Multiplanar CT image reconstructions and MIPs were obtained to evaluate the vascular anatomy. CONTRAST:  31mL OMNIPAQUE IOHEXOL 350 MG/ML SOLN COMPARISON:  None.  FINDINGS: Cardiovascular: Bilateral main, lobar, and segmental pulmonary emboli affecting all lobes. Essentially occlusive clot is seen in multiple locations and clot is nearly occlusive in the left main pulmonary artery with only small volume flow into the left upper lobe. There is RV to LV ratio derangement of approximately 1.5. The left ventricular thickness is likely hypertrophic. Extensive atheromatous plaque in the aorta. Extensive coronary calcification. Mediastinum/Nodes: Right mastectomy. No adenopathy or masslike finding. Lungs/Pleura: Subpleural ground-glass and consolidative opacity in the right lower lobe and to a lesser extent in the left lower lobe, some combination of pulmonary infarct and atelectasis. No edema, effusion, or pneumothorax. Upper Abdomen: No acute finding. Musculoskeletal: Multiple remote and healed rib fractures. T11 and T12 nonacute compression fractures with sclerosis and moderate height loss. Remote right eleventh rib fracture. Remote T3 and T9 compression fractures. Review of the MIP images confirms the above findings. Critical Value/emergent results were called by telephone at the time of interpretation on 12/12/2020 at 6:24 am to provider Dr Nancy Fetter, who verbally acknowledged these results. IMPRESSION: 1. Acute pulmonary emboli with large and proximal clot burden. Right heart strain (RV/LV Ratio = 1.3) consistent with at least submassive (intermediate risk) PE. The presence of right heart strain has been associated with an increased risk of morbidity and mortality. Please refer to the "PE Focused" order set in EPIC. 2. Mild pulmonary infarction.  No pulmonary edema. 3. Extensive atherosclerosis. Electronically Signed   By: Monte Fantasia M.D.   On:  12/12/2020 06:24   DG Chest Port 1 View  Result Date: 12/11/2020 CLINICAL DATA:  Sepsis. EXAM: PORTABLE CHEST 1 VIEW COMPARISON:  May 04, 2020. FINDINGS: Stable cardiomediastinal silhouette. No pneumothorax or pleural effusion  is noted. Lungs are clear. Multiple old right rib fractures are noted. IMPRESSION: No acute cardiopulmonary abnormality seen. Aortic Atherosclerosis (ICD10-I70.0). Electronically Signed   By: Marijo Conception M.D.   On: 12/11/2020 12:05     Results/Tests Pending at Time of Discharge: none  Discharge Medications:  Allergies as of 12/16/2020      Reactions   Anectine [succinylcholine Chloride] Other (See Comments)   Reaction:  Patient states that her body wasn't functioning.  Pt states she was put on life support.   Dilaudid [hydromorphone Hcl] Other (See Comments)   Slept for 3 days and 3 nights   Hydrocodone-acetaminophen Other (See Comments)   Hallucinations, paranoia, amnesia Pt takes Tylenol at home   Iodine Hives, Other (See Comments)   , asthma   Naproxen Sodium Other (See Comments)   REACTION: Asthma   Oxycodone Hcl Other (See Comments)    Hallucinations, paranoia, amnesia Pt takes Percocet at home with no issues   Pentosan Polysulfate Sodium Other (See Comments)   REACTION: unspecified per patient   Pseudoephedrine Other (See Comments)    Palpitations   Succinylcholine    REACTION: "Can't wake up".   Tape Dermatitis   Red inflammed   Tramadol Hcl Other (See Comments)   Unknown reaction   Shellfish Allergy    Runny nose, cough, hoarseness      Medication List    STOP taking these medications   carisoprodol 250 MG tablet Commonly known as: SOMA   diazepam 5 MG tablet Commonly known as: VALIUM   divalproex 250 MG DR tablet Commonly known as: DEPAKOTE   fluticasone 50 MCG/ACT nasal spray Commonly known as: FLONASE   mirabegron ER 50 MG Tb24 tablet Commonly known as: MYRBETRIQ   OLANZapine 5 MG tablet Commonly known as: ZYPREXA   oxyCODONE-acetaminophen 5-325 MG tablet Commonly known as: PERCOCET/ROXICET   sertraline 25 MG tablet Commonly known as: ZOLOFT   Vitamin D3 25 MCG (1000 UT) Caps     TAKE these medications   buPROPion 150 MG 24 hr  tablet Commonly known as: WELLBUTRIN XL Take 150 mg by mouth daily. Notes to patient: Resume home regimen   EPINEPHrine 0.3 mg/0.3 mL Soaj injection Commonly known as: EPI-PEN Inject 0.3 mLs (0.3 mg total) into the muscle as needed for anaphylaxis. Notes to patient: Resume home regimen   haloperidol 0.5 MG tablet Commonly known as: HALDOL Take 1 tablet (0.5 mg total) by mouth every 4 (four) hours as needed for agitation. Notes to patient: Last dose given 04/24 03:17pm   LORazepam 1 MG tablet Commonly known as: ATIVAN Take 1 tablet (1 mg total) by mouth every 4 (four) hours as needed for anxiety.   metoprolol tartrate 25 MG tablet Commonly known as: LOPRESSOR Take 25 mg by mouth 2 (two) times daily.   Morphine Sulfate (Concentrate) 5 MG/0.25ML Soln Take 5 mg by mouth every 4 (four) hours as needed (pain or dyspnea).   ondansetron 4 MG disintegrating tablet Commonly known as: ZOFRAN-ODT Take 1 tablet (4 mg total) by mouth every 6 (six) hours as needed for nausea.       Discharge Instructions: Please refer to Patient Instructions section of EMR for full details.  Patient was counseled important signs and symptoms that should prompt return to medical care,  changes in medications, dietary instructions, activity restrictions, and follow up appointments.   Follow-Up Appointments:   Zola Button, MD 12/16/2020, 12:14 PM PGY-1, [place]

## 2020-12-16 LAB — CULTURE, BLOOD (ROUTINE X 2)
Culture: NO GROWTH
Culture: NO GROWTH

## 2020-12-16 LAB — CULTURE, BLOOD (SINGLE): Culture: NO GROWTH

## 2020-12-16 MED ORDER — LORAZEPAM 1 MG PO TABS: 1.0000 mg | ORAL_TABLET | ORAL | 0 refills | Status: AC | PRN

## 2020-12-16 MED ORDER — MORPHINE SULFATE (CONCENTRATE) 5 MG/0.25ML PO SOLN: 5.0000 mg | ORAL | 0 refills | Status: AC | PRN

## 2020-12-16 NOTE — Progress Notes (Signed)
Brief Palliative Medicine Progress Note:  Received notification that patient had told attending physician she "wants to go home, not back to her SNF, she said that living away from home is not living and is okay dying at home if that is what happens."  PMT was asked to follow-up with patient and healthcare power of attorney.  Went to visit patient at bedside -unfortunately patient had just been discharged with [facility] per primary RN several minutes before my arrival.  Leda Min to discuss patient's concerns and wishes.Carolynn Serve tells me the patient has been trying to go home for a year, unfortunately going back home is not a safe disposition option as she needs 24/7 care and cannot live alone.  There are no family/friends that can provide the patient with 24/7 care (patient has also been combative at home which has made it hard for family/friends to care for her in the past) therefore discharge back to [facility] is the option that [place] requests.  Therapeutic listening and emotional support provided as Carolynn Serve reflects on conversations she has had with the patient -the patient has told her "I know I am dying and I am ready to go." Carolynn Serve expresses this has been very hard for her but she is supportive of the patient's wishes to be kept comfortable and to not pursue life-prolonging interventions. Carolynn Serve tells me that she did not want the patient to have to return to [facility] as she does not like this facility; however, is okay with this facility for now.  We reviewed that when the patient declines enough to have prognosis of 2 weeks or less she can be transferred to [facility].  Natural trajectory and expectations at end of life were reviewed.  Tena reiterates knowing that the patient has had poor p.o. intake and that Ms. Tague time is is limited.   All questions and concerns addressed. Encouraged to call with questions and/or concerns. PMT number previously provided.   Thank you for allowing PMT to assist in  the care of this patient.  Octavious Zidek M. Tamala Julian, FNP-BC Palliative Medicine Team Team Phone: [phone] Total time: 15 minutes  Greater than 50%  of this time was spent counseling and coordinating care related to the above assessment and plan.  *Portions of this note are a verbal dictation therefore any spelling and/or grammatical errors are due to the "[place] [employer]" system interpretation.

## 2020-12-16 NOTE — Progress Notes (Signed)
Called facility and gave report to [facility], Pt not in distress, to discharge to facility with belongings via [place].

## 2020-12-16 NOTE — TOC Transition Note (Addendum)
Transition of Care [facility]) - CM/SW Discharge Note   Patient Details  Name: [name] MRN: [id number] Date of Birth: 06-22-1930  Transition of Care [facility]) CM/SW Contact:  Sharin Mons, RN Phone Number: [phone] 12/16/2020, 10:51 AM   Clinical Narrative:    Patient will [facility] to: [facility] with hospice  Anticipated DC date: 12/16/2020 Family notified:yes Transport by: Corey Harold   Per MD patient ready for DC today. RN, patient, patient's family ( Tena), and facility notified of DC. [facility]/ Audrea Muscat  made aware of d/c plan by voice message [phone]) Discharge Summary and FL2 sent to facility. RN to call report prior to discharge ([phone]). DC packet on chart. Ambulance transport requested for patient.  Carolynn Serve [place] Denman [phone]       RNCM will sign off for now as intervention is no longer needed. Please consult [place] again if new needs arise.   Final next level of care: [place] Barriers to Discharge: No Barriers Identified   Patient Goals and CMS Choice        Discharge Placement              Patient chooses bed at: [facility] Patient to be transferred to facility by: [place] Name of family member notified: Tena Ghalager- [place]on Patient and family notified of of transfer: 12/15/20  Discharge Plan and Services                                     Social Determinants of Health (SDOH) Interventions     Readmission Risk Interventions No flowsheet data found.

## 2020-12-16 NOTE — Plan of Care (Signed)

## 2021-07-10 IMAGING — CR DG WRIST COMPLETE 3+V*L*
4 series · 4 of 4 positions shown · non-contrast
Comparison: None

CLINICAL DATA: Unwitnessed fall, states she fell and caught herself
with her LEFT hand, LEFT hand and wrist pain, initial encounter,
history breast cancer, hypertension

EXAM:
LEFT WRIST - COMPLETE 3+ VIEW

[x wrist pa left]
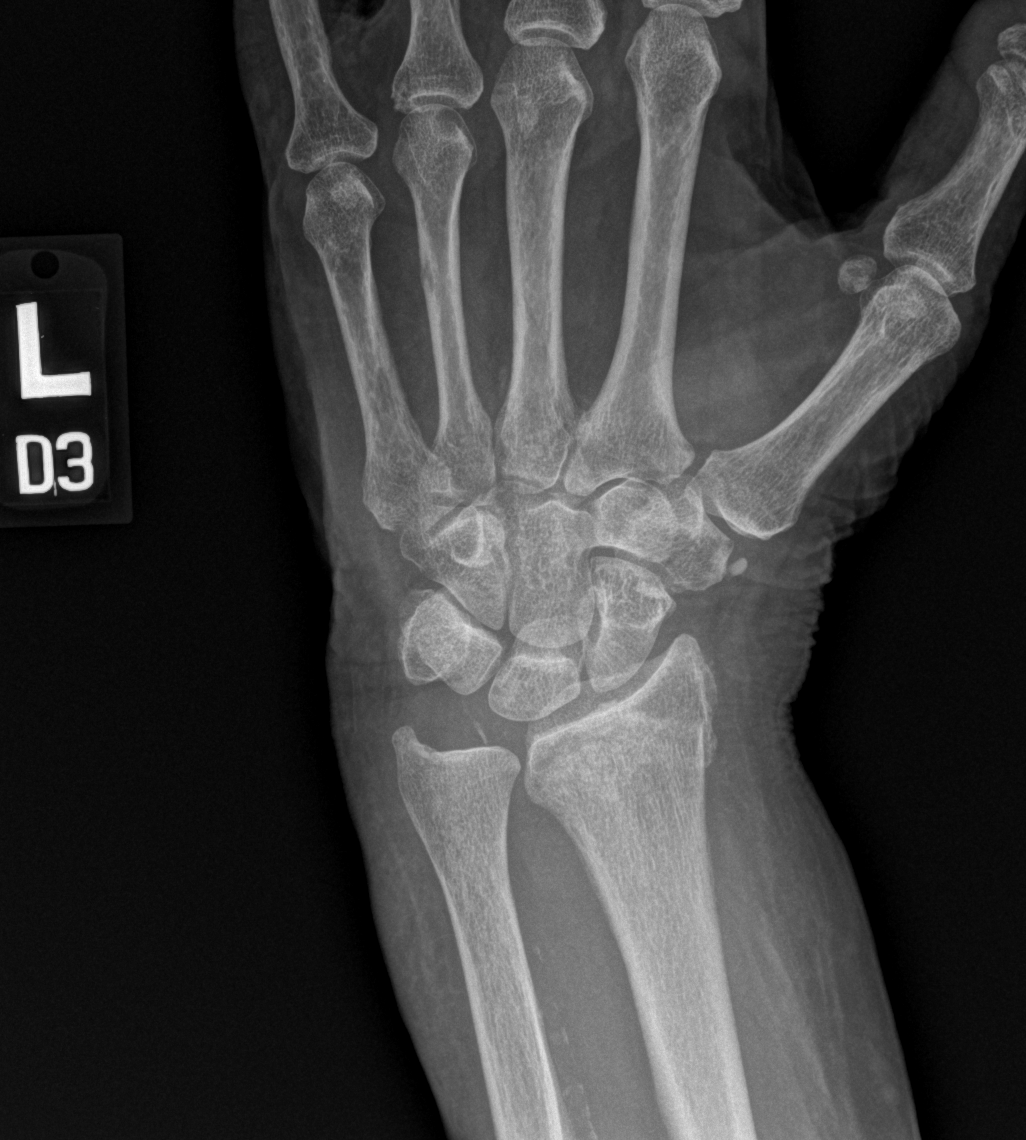

[x wrist obl left]
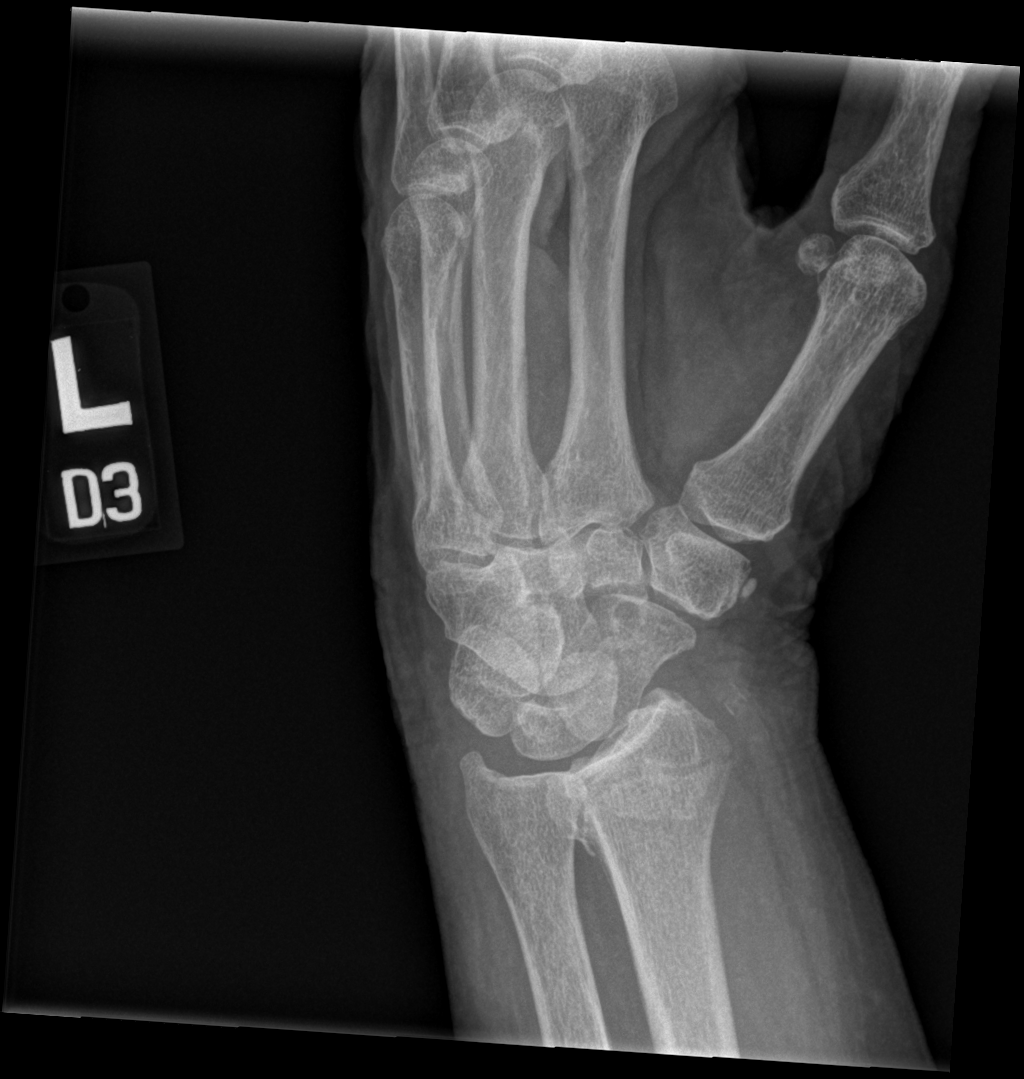

[x wrist lat left]
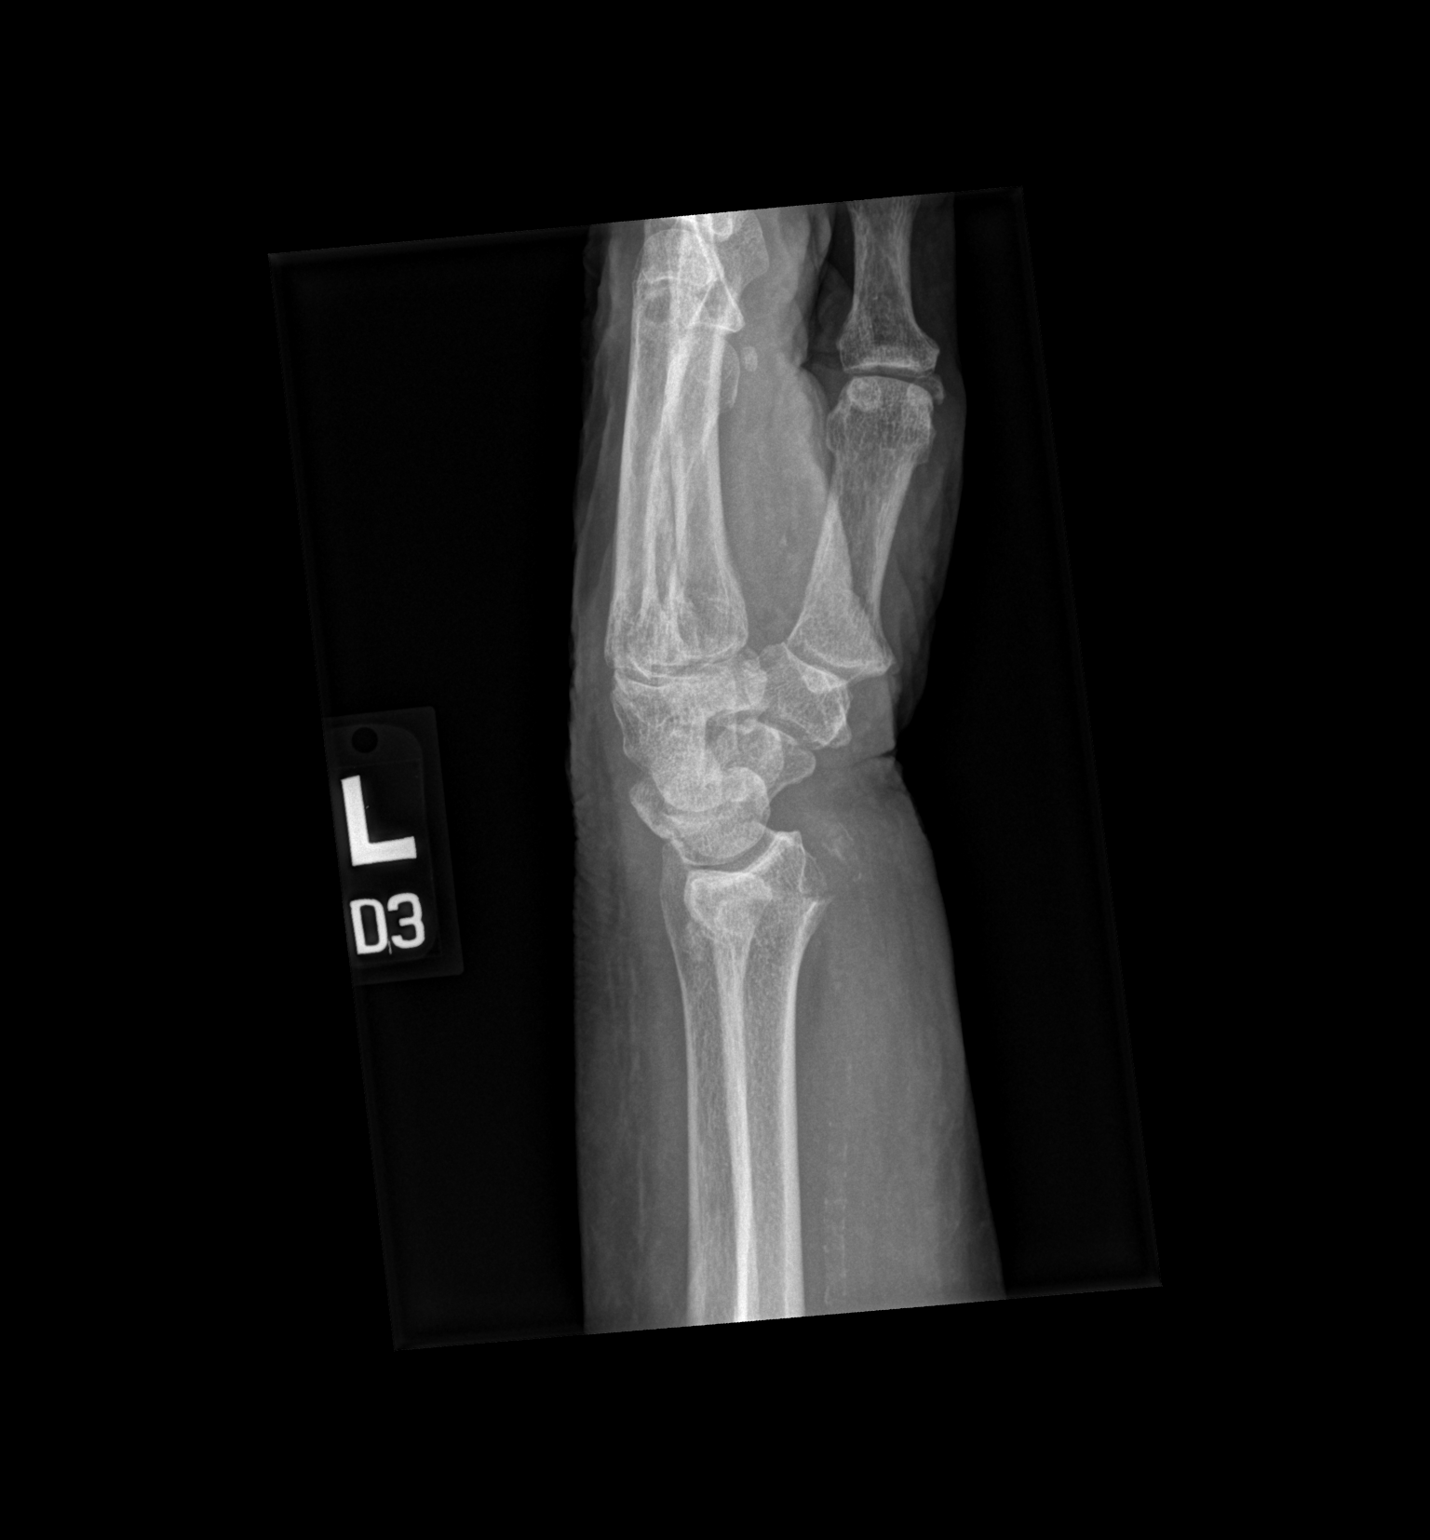

[x wrist navicular view left]
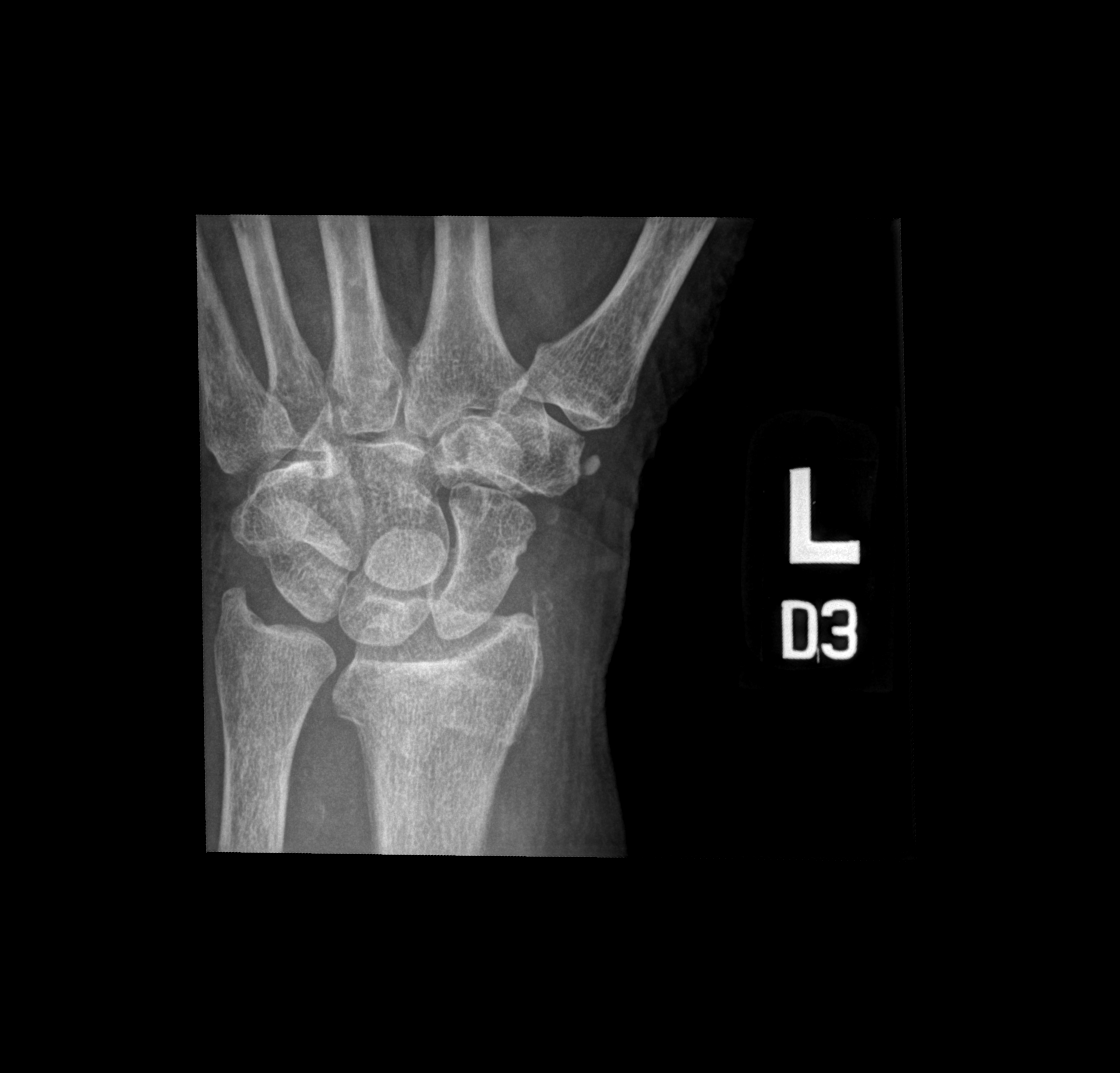

[4 of 4 positions shown; findings below may reference images not displayed]

FINDINGS: Osseous demineralization.

Joint spaces preserved.

Transverse metaphyseal fracture distal LEFT radius with apex volar
angulation and dorsal tilt of the distal radial articular surface.

No definite intra-articular extension.

No additional fracture, dislocation, or bone destruction.

Associated soft tissue swelling.
IMPRESSION: Transverse metaphyseal fracture distal LEFT radius with apex volar
angulation.

## 2021-07-10 IMAGING — CT CT CERVICAL SPINE W/O CM
3 of 4 series · 12 of 33 positions shown, 14 images · non-contrast
Comparison: Head CT July 17, 2019 and brain MRI March 05, 2020.

CLINICAL DATA: Pain following fall

EXAM:
CT HEAD WITHOUT CONTRAST
CT CERVICAL SPINE WITHOUT CONTRAST
TECHNIQUE: Multidetector CT imaging of the head and cervical spine was
performed following the standard protocol without intravenous
contrast. Multiplanar CT image reconstructions of the cervical spine
were also generated.

[Series 6: orthogonal bone · axial · 0.23mm/px · z∈[-323,-203]mm · 4 of 99 slices shown, 5 images]
[im 17/99  soft-tissue]
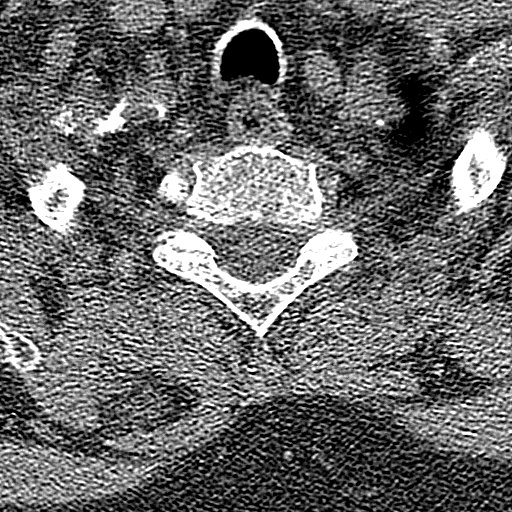
[im 17/99  bone]
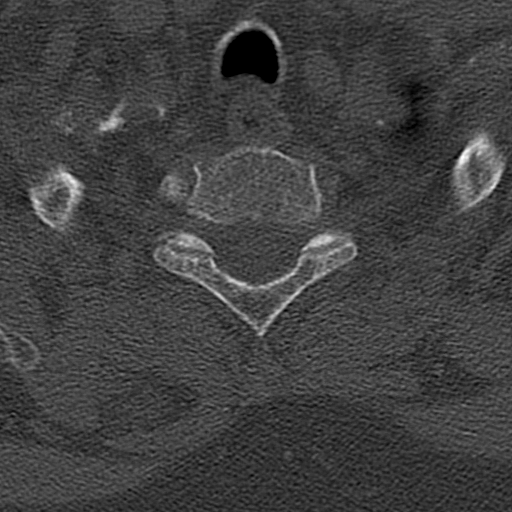
[im 33/99  bone]
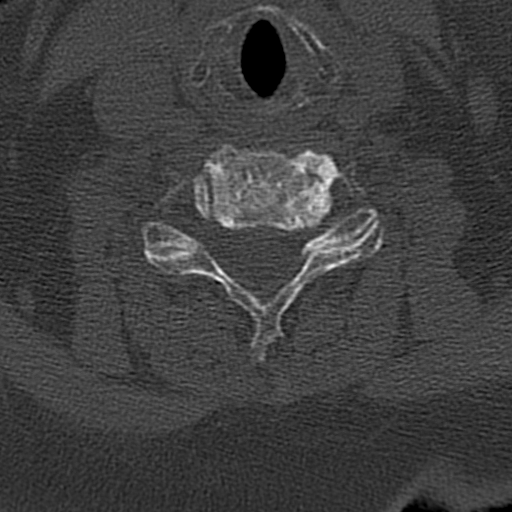
[im 66/99  bone]
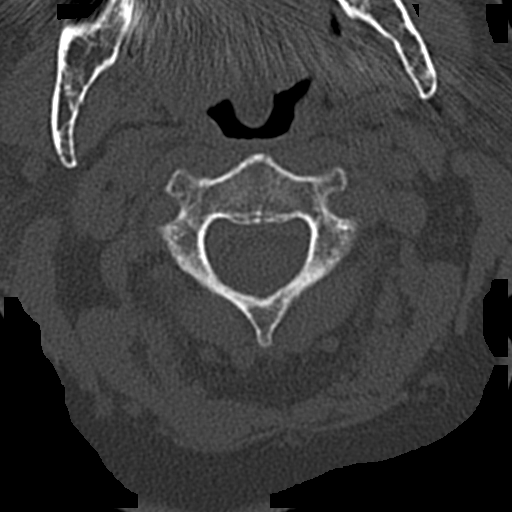
[im 82/99  bone]
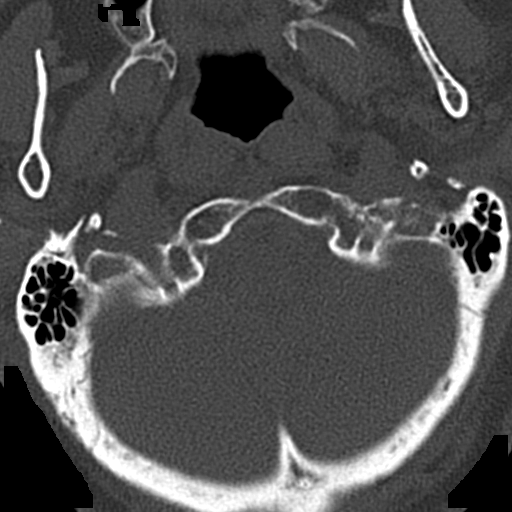

[Series 7: coronal bone · coronal · 0.27mm/px · 3 of 61 slices shown]
[im 13/61  bone]
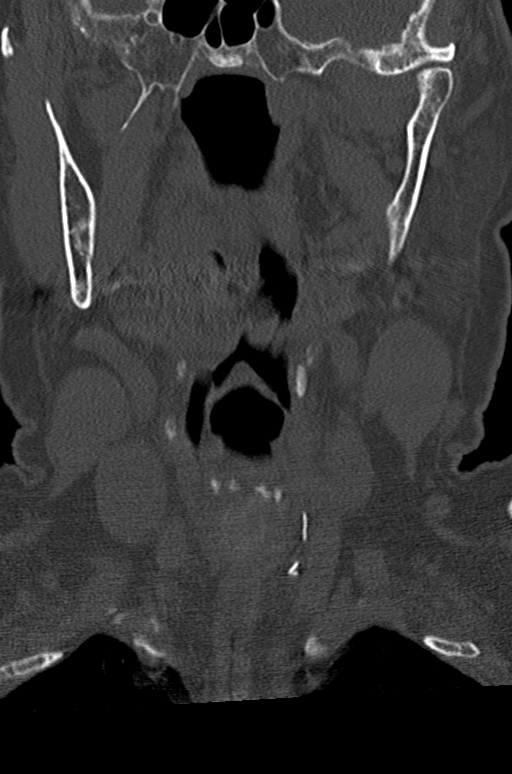
[im 25/61  bone]
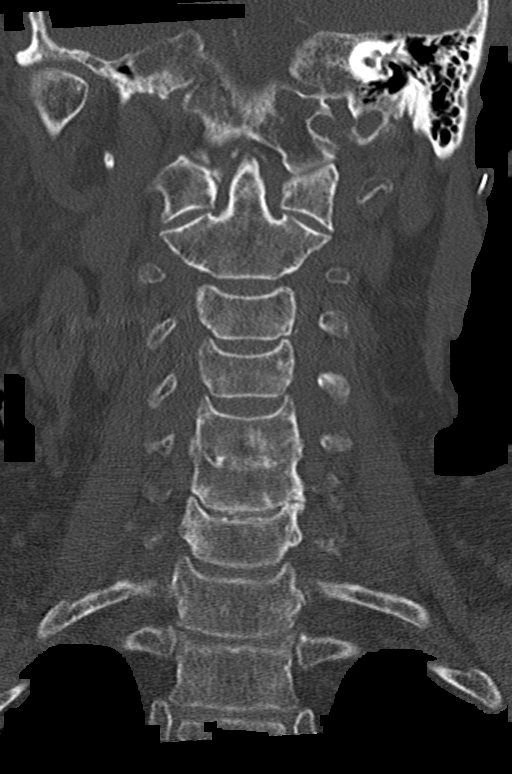
[im 37/61  bone]
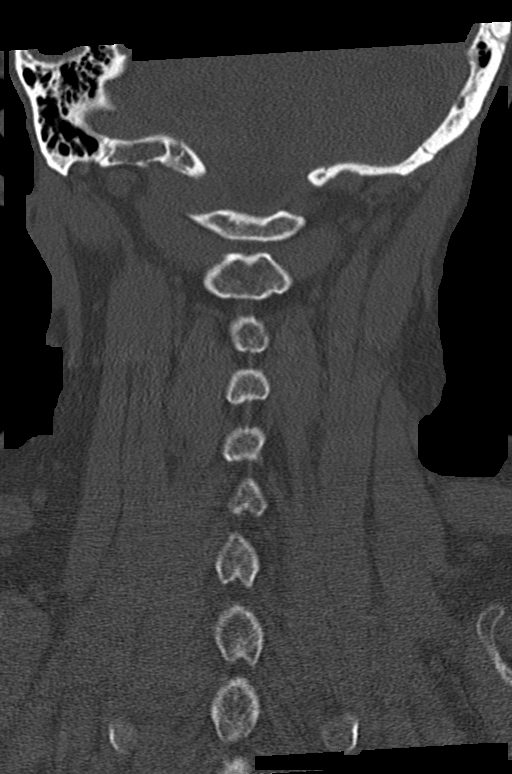

[Series 8: sagittal bone · sagittal · 0.26mm/px · 5 of 61 slices shown, 6 images]
[im 21/61  bone]
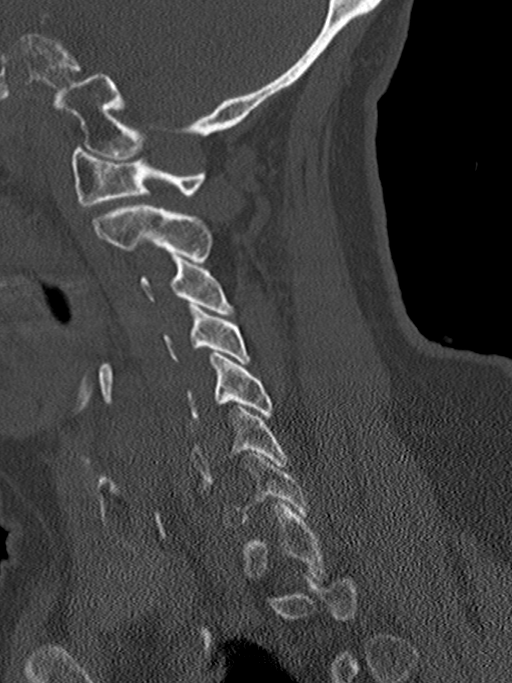
[im 26/61  bone]
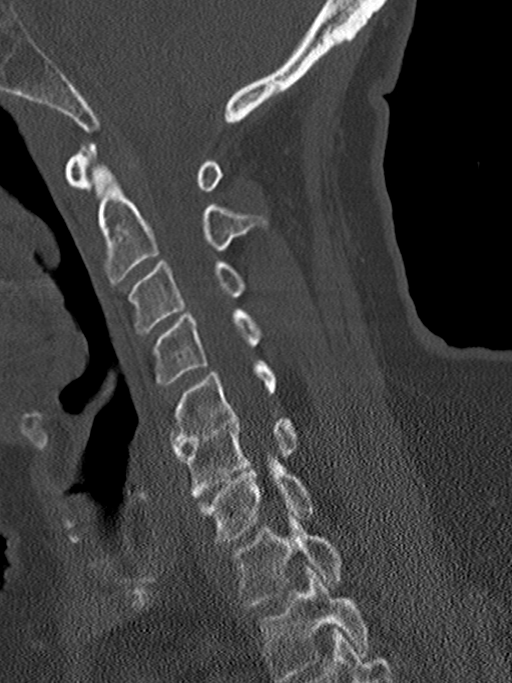
[im 31/61  soft-tissue]
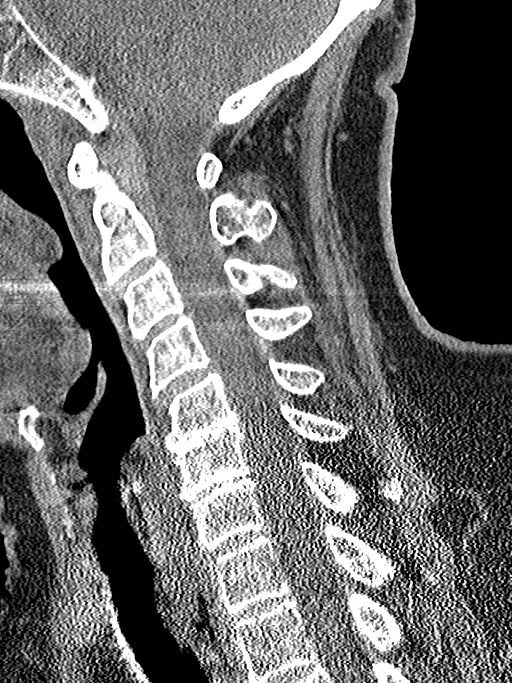
[im 31/61  bone]
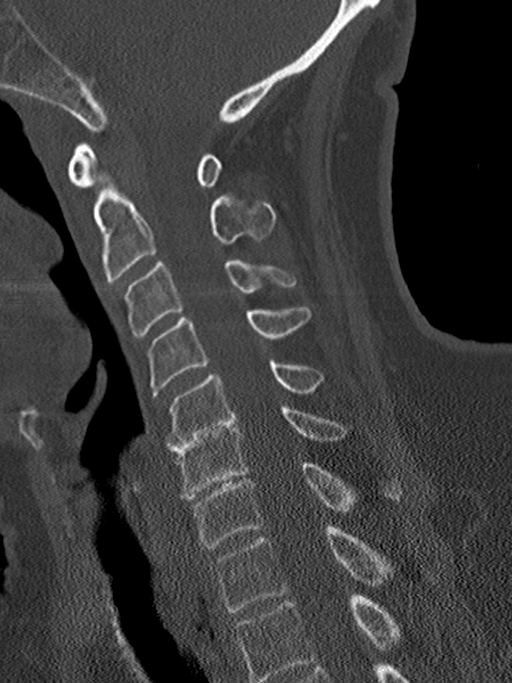
[im 36/61  bone]
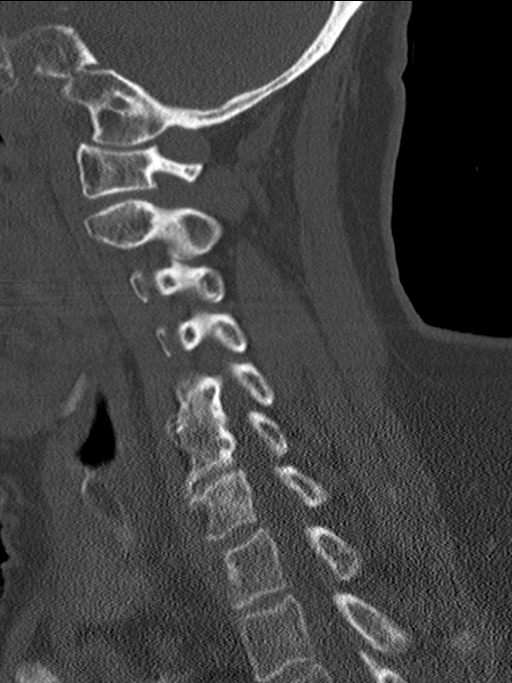
[im 41/61  bone]
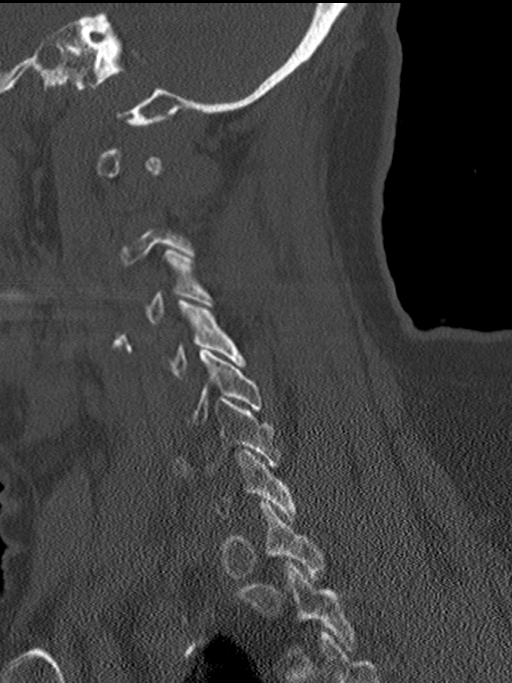

[12 of 33 positions shown; findings below may reference images not displayed]

FINDINGS: CT HEAD FINDINGS

Brain: Mild to moderate diffuse atrophy is stable. There is no
intracranial mass, hemorrhage, extra-axial fluid collection, or
midline shift. There is patchy small vessel disease throughout the
centra semiovale bilaterally, essentially stable. Prior small
infarcts are noted in the posterior limb of the left external
capsule as well as in each thalamus. No acute infarct is appreciable
on this study.

Vascular: No appreciable hyperdense vessel. There is calcification
in each carotid siphon region.

Skull: Bony calvarium appears intact. Bones appear somewhat
osteoporotic.

Sinuses/Orbits: There is mild mucosal thickening in several ethmoid
air cells. Orbits appear symmetric bilaterally.

Other: Mastoid air cells are clear. There is a probable calcified
sebaceous cyst in the left temporal scalp region measuring 8 mm.

CT CERVICAL SPINE FINDINGS

Alignment: There is no appreciable spondylolisthesis.

Skull base and vertebrae: Skull base and craniocervical junction
regions appear normal. Bones are osteoporotic. No fracture
appreciable. No blastic or lytic bone lesions.

Soft tissues and spinal canal: Prevertebral soft tissues and
predental space regions are normal. There is no cord or canal
hematoma evident. No paraspinous lesions are appreciable.

Disc levels: There is severe disc space narrowing at C5-6 with
moderately severe disc space narrowing at C6-7. Other disc spaces
appear unremarkable. There is facet hypertrophy at multiple levels.
There is exit foraminal narrowing at C5-6 bilaterally and at C6-7 on
the left due to bony hypertrophy. There is impression on the left C6
nerve root due to bony hypertrophy. There is no frank disc extrusion
or stenosis.

Upper chest: Visualized upper lung regions are clear.

Other: There is carotid and subclavian artery atherosclerotic
calcification bilaterally.
IMPRESSION: CT head: Atrophy with periventricular small vessel disease
bilaterally, stable. Prior small infarcts in the posterior limb of
the left external capsule and in each thalamus. No acute infarct
evident. No mass or hemorrhage.

There are foci of arterial vascular calcification. Bones appear
osteoporotic. There is mild mucosal thickening in several ethmoid
air cells.

CT cervical spine: No fracture or spondylolisthesis. There is
osteoarthritic change, most severe at C5-6 and C6-7. No frank disc
extrusion or stenosis.

Bones are osteoporotic.

Multifocal areas of arterial vascular calcification noted.
